# Patient Record
Sex: Male | Born: 1950 | Race: White | Hispanic: No | Marital: Single | Smoking: Never smoker
Health system: Southern US, Community
[De-identification: ages and names within clinical notes are randomized; demographics above are authoritative.]

## PROBLEM LIST (undated history)

## (undated) DIAGNOSIS — F1021 Alcohol dependence, in remission: Secondary | ICD-10-CM

## (undated) DIAGNOSIS — K7200 Acute and subacute hepatic failure without coma: Secondary | ICD-10-CM

## (undated) DIAGNOSIS — K7682 Hepatic encephalopathy: Secondary | ICD-10-CM

## (undated) DIAGNOSIS — D509 Iron deficiency anemia, unspecified: Secondary | ICD-10-CM

## (undated) DIAGNOSIS — D696 Thrombocytopenia, unspecified: Secondary | ICD-10-CM

## (undated) DIAGNOSIS — I10 Essential (primary) hypertension: Secondary | ICD-10-CM

## (undated) DIAGNOSIS — K7290 Hepatic failure, unspecified without coma: Secondary | ICD-10-CM

## (undated) DIAGNOSIS — L259 Unspecified contact dermatitis, unspecified cause: Secondary | ICD-10-CM

## (undated) HISTORY — PX: LIVER TRANSPLANT: SHX410

## (undated) HISTORY — DX: Thrombocytopenia, unspecified: D69.6

## (undated) HISTORY — DX: Hepatic encephalopathy: K76.82

## (undated) HISTORY — DX: Unspecified contact dermatitis, unspecified cause: L25.9

## (undated) HISTORY — DX: Alcohol dependence, in remission: F10.21

## (undated) HISTORY — DX: Acute and subacute hepatic failure without coma: K72.00

## (undated) HISTORY — DX: Essential (primary) hypertension: I10

## (undated) HISTORY — PX: OTHER SURGICAL HISTORY: SHX169

## (undated) HISTORY — DX: Iron deficiency anemia, unspecified: D50.9

## (undated) HISTORY — PX: HERNIA REPAIR: SHX51

## (undated) HISTORY — DX: Hepatic failure, unspecified without coma: K72.90

---

## 1999-09-07 HISTORY — PX: OTHER SURGICAL HISTORY: SHX169

## 2000-02-05 ENCOUNTER — Encounter: Payer: Self-pay | Admitting: Emergency Medicine

## 2000-02-05 ENCOUNTER — Inpatient Hospital Stay (HOSPITAL_COMMUNITY): Admission: EM | Admit: 2000-02-05 | Discharge: 2000-02-12 | Payer: Self-pay

## 2000-02-08 ENCOUNTER — Encounter: Payer: Self-pay | Admitting: General Surgery

## 2002-01-22 ENCOUNTER — Emergency Department (HOSPITAL_COMMUNITY): Admission: EM | Admit: 2002-01-22 | Discharge: 2002-01-22 | Payer: Self-pay | Admitting: Emergency Medicine

## 2002-05-13 ENCOUNTER — Emergency Department (HOSPITAL_COMMUNITY): Admission: EM | Admit: 2002-05-13 | Discharge: 2002-05-14 | Payer: Self-pay | Admitting: Emergency Medicine

## 2002-05-14 ENCOUNTER — Encounter: Payer: Self-pay | Admitting: Emergency Medicine

## 2002-09-06 DIAGNOSIS — K7200 Acute and subacute hepatic failure without coma: Secondary | ICD-10-CM

## 2002-09-06 HISTORY — DX: Acute and subacute hepatic failure without coma: K72.00

## 2004-02-09 ENCOUNTER — Inpatient Hospital Stay (HOSPITAL_COMMUNITY): Admission: EM | Admit: 2004-02-09 | Discharge: 2004-02-13 | Payer: Self-pay | Admitting: Emergency Medicine

## 2004-02-10 ENCOUNTER — Encounter (INDEPENDENT_AMBULATORY_CARE_PROVIDER_SITE_OTHER): Payer: Self-pay | Admitting: Cardiology

## 2004-10-16 ENCOUNTER — Ambulatory Visit: Payer: Self-pay

## 2004-10-16 ENCOUNTER — Ambulatory Visit: Payer: Self-pay | Admitting: Cardiology

## 2004-10-22 ENCOUNTER — Ambulatory Visit (HOSPITAL_COMMUNITY): Admission: RE | Admit: 2004-10-22 | Discharge: 2004-10-22 | Payer: Self-pay | Admitting: Cardiovascular Disease

## 2004-10-29 ENCOUNTER — Ambulatory Visit: Payer: Self-pay | Admitting: Internal Medicine

## 2005-11-16 ENCOUNTER — Emergency Department (HOSPITAL_COMMUNITY): Admission: EM | Admit: 2005-11-16 | Discharge: 2005-11-16 | Payer: Self-pay | Admitting: Family Medicine

## 2005-11-19 ENCOUNTER — Encounter: Admission: RE | Admit: 2005-11-19 | Discharge: 2005-11-19 | Payer: Self-pay | Admitting: Orthopedic Surgery

## 2006-11-14 ENCOUNTER — Ambulatory Visit: Payer: Self-pay | Admitting: Internal Medicine

## 2006-11-14 ENCOUNTER — Inpatient Hospital Stay (HOSPITAL_COMMUNITY): Admission: EM | Admit: 2006-11-14 | Discharge: 2006-11-16 | Payer: Self-pay | Admitting: Emergency Medicine

## 2006-11-14 ENCOUNTER — Encounter (INDEPENDENT_AMBULATORY_CARE_PROVIDER_SITE_OTHER): Payer: Self-pay | Admitting: Cardiology

## 2006-11-15 ENCOUNTER — Ambulatory Visit: Payer: Self-pay | Admitting: Infectious Diseases

## 2006-11-21 ENCOUNTER — Ambulatory Visit: Payer: Self-pay | Admitting: Gastroenterology

## 2007-05-01 ENCOUNTER — Ambulatory Visit (HOSPITAL_COMMUNITY): Admission: RE | Admit: 2007-05-01 | Discharge: 2007-05-01 | Payer: Self-pay | Admitting: Internal Medicine

## 2007-05-18 LAB — HM COLONOSCOPY: HM Colonoscopy: NORMAL

## 2008-08-19 ENCOUNTER — Other Ambulatory Visit: Payer: Self-pay | Admitting: Emergency Medicine

## 2008-08-19 ENCOUNTER — Inpatient Hospital Stay (HOSPITAL_COMMUNITY): Admission: AD | Admit: 2008-08-19 | Discharge: 2008-08-22 | Payer: Self-pay | Admitting: Internal Medicine

## 2008-08-20 ENCOUNTER — Ambulatory Visit: Payer: Self-pay | Admitting: Gastroenterology

## 2008-08-21 ENCOUNTER — Encounter (INDEPENDENT_AMBULATORY_CARE_PROVIDER_SITE_OTHER): Payer: Self-pay | Admitting: Interventional Radiology

## 2008-08-22 ENCOUNTER — Encounter: Payer: Self-pay | Admitting: Gastroenterology

## 2008-09-02 ENCOUNTER — Emergency Department (HOSPITAL_COMMUNITY): Admission: EM | Admit: 2008-09-02 | Discharge: 2008-09-02 | Payer: Self-pay | Admitting: Emergency Medicine

## 2008-12-20 ENCOUNTER — Encounter: Payer: Self-pay | Admitting: Internal Medicine

## 2009-03-10 ENCOUNTER — Ambulatory Visit: Payer: Self-pay | Admitting: Interventional Radiology

## 2009-03-10 ENCOUNTER — Encounter: Payer: Self-pay | Admitting: Emergency Medicine

## 2009-03-10 ENCOUNTER — Inpatient Hospital Stay (HOSPITAL_COMMUNITY): Admission: EM | Admit: 2009-03-10 | Discharge: 2009-03-12 | Payer: Self-pay | Admitting: Internal Medicine

## 2009-03-14 ENCOUNTER — Emergency Department (HOSPITAL_COMMUNITY): Admission: EM | Admit: 2009-03-14 | Discharge: 2009-03-14 | Payer: Self-pay | Admitting: Emergency Medicine

## 2009-03-17 ENCOUNTER — Ambulatory Visit (HOSPITAL_COMMUNITY): Admission: RE | Admit: 2009-03-17 | Discharge: 2009-03-17 | Payer: Self-pay | Admitting: Emergency Medicine

## 2009-03-25 ENCOUNTER — Inpatient Hospital Stay (HOSPITAL_COMMUNITY): Admission: EM | Admit: 2009-03-25 | Discharge: 2009-03-27 | Payer: Self-pay | Admitting: Emergency Medicine

## 2009-03-28 ENCOUNTER — Ambulatory Visit (HOSPITAL_COMMUNITY): Admission: RE | Admit: 2009-03-28 | Discharge: 2009-03-28 | Payer: Self-pay | Admitting: Emergency Medicine

## 2009-03-31 ENCOUNTER — Ambulatory Visit: Payer: Self-pay | Admitting: Gastroenterology

## 2009-05-19 ENCOUNTER — Inpatient Hospital Stay (HOSPITAL_COMMUNITY): Admission: EM | Admit: 2009-05-19 | Discharge: 2009-05-21 | Payer: Self-pay | Admitting: Emergency Medicine

## 2009-05-19 ENCOUNTER — Ambulatory Visit: Payer: Self-pay | Admitting: Infectious Diseases

## 2009-07-15 ENCOUNTER — Emergency Department (HOSPITAL_BASED_OUTPATIENT_CLINIC_OR_DEPARTMENT_OTHER): Admission: EM | Admit: 2009-07-15 | Discharge: 2009-07-15 | Payer: Self-pay | Admitting: Emergency Medicine

## 2009-08-17 ENCOUNTER — Encounter: Payer: Self-pay | Admitting: Internal Medicine

## 2009-09-19 ENCOUNTER — Ambulatory Visit (HOSPITAL_COMMUNITY): Admission: RE | Admit: 2009-09-19 | Discharge: 2009-09-19 | Payer: Self-pay | Admitting: Interventional Radiology

## 2009-10-29 ENCOUNTER — Inpatient Hospital Stay (HOSPITAL_COMMUNITY): Admission: EM | Admit: 2009-10-29 | Discharge: 2009-11-01 | Payer: Self-pay | Admitting: Emergency Medicine

## 2009-10-30 ENCOUNTER — Encounter: Payer: Self-pay | Admitting: Internal Medicine

## 2009-10-30 LAB — CONVERTED CEMR LAB
HDL: 25 mg/dL
LDL Cholesterol: 37 mg/dL
TSH: 2.141 microintl units/mL

## 2009-10-31 ENCOUNTER — Ambulatory Visit: Payer: Self-pay | Admitting: Internal Medicine

## 2009-11-01 ENCOUNTER — Encounter: Payer: Self-pay | Admitting: Internal Medicine

## 2009-11-01 LAB — CONVERTED CEMR LAB
HCT: 22.8 %
Hemoglobin: 8.1 g/dL
RBC: 2.32 M/uL

## 2009-11-10 ENCOUNTER — Ambulatory Visit (HOSPITAL_COMMUNITY): Admission: RE | Admit: 2009-11-10 | Discharge: 2009-11-10 | Payer: Self-pay | Admitting: Interventional Radiology

## 2009-12-15 ENCOUNTER — Ambulatory Visit (HOSPITAL_COMMUNITY): Admission: RE | Admit: 2009-12-15 | Discharge: 2009-12-15 | Payer: Self-pay | Admitting: Interventional Radiology

## 2010-01-26 ENCOUNTER — Encounter: Payer: Self-pay | Admitting: Internal Medicine

## 2010-02-25 ENCOUNTER — Ambulatory Visit (HOSPITAL_COMMUNITY): Admission: RE | Admit: 2010-02-25 | Discharge: 2010-02-25 | Payer: Self-pay | Admitting: Interventional Radiology

## 2010-03-27 ENCOUNTER — Ambulatory Visit (HOSPITAL_COMMUNITY): Admission: RE | Admit: 2010-03-27 | Discharge: 2010-03-27 | Payer: Self-pay | Admitting: Interventional Radiology

## 2010-05-19 ENCOUNTER — Ambulatory Visit (HOSPITAL_COMMUNITY): Admission: RE | Admit: 2010-05-19 | Discharge: 2010-05-19 | Payer: Self-pay | Admitting: Interventional Radiology

## 2010-06-06 ENCOUNTER — Emergency Department (HOSPITAL_COMMUNITY): Admission: EM | Admit: 2010-06-06 | Discharge: 2010-06-06 | Payer: Self-pay | Admitting: Emergency Medicine

## 2010-06-16 ENCOUNTER — Ambulatory Visit (HOSPITAL_COMMUNITY)
Admission: RE | Admit: 2010-06-16 | Discharge: 2010-06-16 | Payer: Self-pay | Source: Home / Self Care | Admitting: Interventional Radiology

## 2010-06-19 ENCOUNTER — Emergency Department (HOSPITAL_COMMUNITY)
Admission: EM | Admit: 2010-06-19 | Discharge: 2010-06-19 | Payer: Self-pay | Source: Home / Self Care | Admitting: Emergency Medicine

## 2010-08-20 ENCOUNTER — Ambulatory Visit (HOSPITAL_COMMUNITY)
Admission: RE | Admit: 2010-08-20 | Discharge: 2010-08-20 | Payer: Self-pay | Source: Home / Self Care | Attending: Interventional Radiology | Admitting: Interventional Radiology

## 2010-08-29 ENCOUNTER — Inpatient Hospital Stay (HOSPITAL_COMMUNITY)
Admission: EM | Admit: 2010-08-29 | Discharge: 2010-09-03 | Payer: Self-pay | Source: Home / Self Care | Attending: Pulmonary Disease | Admitting: Pulmonary Disease

## 2010-08-29 DIAGNOSIS — K7290 Hepatic failure, unspecified without coma: Secondary | ICD-10-CM

## 2010-08-30 ENCOUNTER — Encounter: Payer: Self-pay | Admitting: Internal Medicine

## 2010-08-30 LAB — CONVERTED CEMR LAB
Hemoglobin: 7.4 g/dL
MCV: 101.9 fL
WBC: 10.5 10*3/uL

## 2010-08-31 ENCOUNTER — Encounter (INDEPENDENT_AMBULATORY_CARE_PROVIDER_SITE_OTHER): Payer: Self-pay | Admitting: Pulmonary Disease

## 2010-08-31 ENCOUNTER — Encounter: Payer: Self-pay | Admitting: Internal Medicine

## 2010-08-31 LAB — CONVERTED CEMR LAB
ALT: 21 units/L
AST: 39 units/L
BUN: 24 mg/dL
Chloride: 119 meq/L
Glucose, Bld: 131 mg/dL
Total Bilirubin: 1.9 mg/dL
Total Protein: 6.1 g/dL

## 2010-09-03 ENCOUNTER — Encounter: Payer: Self-pay | Admitting: Internal Medicine

## 2010-09-03 LAB — CONVERTED CEMR LAB
Alkaline Phosphatase: 88 units/L
BUN: 37 mg/dL
Calcium: 7.3 mg/dL
Creatinine, Ser: 1.65 mg/dL
Glucose, Bld: 104 mg/dL
Potassium: 3.6 meq/L
Sodium: 128 meq/L
Total Protein: 6.1 g/dL

## 2010-09-14 ENCOUNTER — Ambulatory Visit (HOSPITAL_COMMUNITY)
Admission: RE | Admit: 2010-09-14 | Discharge: 2010-09-14 | Payer: Self-pay | Source: Home / Self Care | Attending: Interventional Radiology | Admitting: Interventional Radiology

## 2010-09-22 ENCOUNTER — Encounter: Payer: Self-pay | Admitting: Internal Medicine

## 2010-09-25 ENCOUNTER — Encounter: Payer: Self-pay | Admitting: Internal Medicine

## 2010-09-26 ENCOUNTER — Encounter: Payer: Self-pay | Admitting: Interventional Radiology

## 2010-09-27 ENCOUNTER — Encounter: Payer: Self-pay | Admitting: Diagnostic Radiology

## 2010-09-27 ENCOUNTER — Encounter: Payer: Self-pay | Admitting: Interventional Radiology

## 2010-09-27 NOTE — Discharge Summary (Signed)
[name], [name]            ACCOUNT NO.:  [id number]  MEDICAL RECORD NO.:  [id number]          PATIENT TYPE:  INP  LOCATION:  5157                         FACILITY:  [facility]  PHYSICIAN:  Leslye Peer, MD    DATE OF BIRTH:  [birth year]  DATE OF ADMISSION:  08/29/2010 DATE OF DISCHARGE:  09/03/2010                              DISCHARGE SUMMARY   DISCHARGE DIAGNOSES: 1. Resolved hepatic encephalopathy. 2. Acute respiratory failure secondary to resolved hepatic     encephalopathy. 3. 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 4. Non-anion gap acidosis. 5. Anemia. 6. Thrombocytopenia. 7. History of biliary and internal/external drain placement for     biliary duct obstruction.  CONSULTANTS:  Interventional Radiology, Dr. Kristeen Mans.  LABORATORY DATA:  Prograf level 2.3 on August 31, 2010, phosphorus September 03, 2010, 3.8, magnesium 1.7, sodium 128, potassium 3.6, chloride 102, CO2 of 21, glucose 104, BUN 37, creatinine 1.65, PT/INR is 22.1/1.92.  Hemoglobin 7.2, hematocrit 20.3, platelet count 44, white blood cell count 5.9.  BRIEF HISTORY:  A 60 year old male patient with known history of alcoholism, resultant liver failure, and liver transplant in 2005 at the [facility] presents to the hospital on August 29, 2010, with altered sensorium and an ammonia level of 409.  He was intubated by the emergency room physician with concern for inability to protect airway.  Pulmonary critical care has asked to admit.  Of note, the patient was found to have of a bottle of Percocet at the bedside, which was refilled only 2 weeks prior to the hospital presentation, however, was empty on hospital at admit day.  His acetaminophen level on hospital admit day was less than 10.  HOSPITAL COURSE BY DISCHARGE DIAGNOSES: 1. Hepatic encephalopathy.  After further evaluation, [name]     reports following  return to normal cognitive status that he had     been trying to alter his home GI regimen, reporting that he had     been alternating his neomycin, and rifaximin administration.  Also     had been alternating, and at times not taking lactulose.  He was     admitted to the hospital in coma/altered sensorium.  He was given     lactulose, and neomycin, and his maintenance medications.  His     ammonia fell nicely and upon time of dictation, his last ammonia     level, which was on the 26th  was down to 44 from over 400.  At the     time of dictation, his mentation is he has returned to baseline     level of consciousness. 2. Acute respiratory failure secondary to hepatic encephalopathy.  He     did require mechanical ventilation and airway support from August 29, 2010, to August 30, 2010.  He was extubated without sequelae. 3. History of end-stage hepatic disease secondary to alcoholism,     status post liver transplant in 2005.  [name] PT/INR is     stable.  Platelet count is stable.  LFTs have improved  following     hospital admit with AST of 29, ALT of 18.  Upon time of discharge,     INR was 1.92, this had decreased from 2.14 since time of discharge.     Pulmonary Hepatics followup, he will be discharged home on his     regular regimen, which had been probably dictated by the Transplant     Center at [facility].  I spoke with the transplant     coordinator at [facility], also has been on the phone     with [facility].  They would be happy to evaluate     [name] for appropriateness to be followed at [facility] for local     support; however, they requested that his records be faxed to them,     as well as that they received a copy of disk version copy of all of     his imaging studies including MRI, echocardiogram, and a CAT scan     imaging dating back for the last 3-6 months.  The contact number     for [facility] is [phone].   This is the Department Of     Gastroenterology, he will need to be forwarded to the transplant     services. 4. Non-anion gap acidosis.  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  Plan for this is to continue his     bicarbonate supplementation. 5. Chronic anemia.  Currently, hemoglobin stable in the 7.2 range with     no evidence of bleeding.  He also has a history of iron-deficiency     anemia.  He will continue his iron supplementation. 6. Thrombocytopenia.  This is stable with no evidence of bleeding.     Platelet count as noted above. 7. History of chronic biliary and chronic peritoneal catheter drain.     These were maintained per his home regimen.  He did have his     biliary catheter dislodged during this hospitalization.  This did     require replacement, it was change and been pulled back in     malposition and caused leaking.  Interventional Radiology saw Mr.     [name], and were able to replace and reposition a new 8.5-French     catheter in its place.  It was capped as before.  DISCHARGE INSTRUCTIONS:  Diet as tolerated.  DISCHARGE MEDICATIONS: 1. Lactulose 10 g, he used to take 30 mL daily. 2. Ferrous sulfate 325 mg daily. 3. Furosemide 40 mg twice a day. 4. Magnesium oxide 400 mg daily. 5. Multivitamin daily. 6. Nadolol 20 mg daily. 7. Neomycin 500 mg 3 tablets. 8. Omeprazole 20 mg daily. 9. Prograf 0.5 mg daily. 10.Sodium bicarb 650 mg, 3 tablets by mouth 3 times a day. 11.Spironolactone 100 mg by mouth daily. 12.Ursodiol 300 mg 3 tablets a day. 13.Vitamin C 500 mg 1 tablet daily. 14.Rifaximin 1 tablet twice a day. 15.Zinc 220 mg p.o. twice a day.  He is instructed to discontinue all     narcotics.  DISCHARGE FOLLOWUP:  He will be seen at [facility] on September 22, 2009, for diagnostic imaging.  He will follow with Dr. Elizebeth Koller on September 28, 2009, at 9:45 a.m.  He will also see Dr. Rene Paci here in [facility]  October 16, 2009, at 9:15.  Again, [facility] has been contacted.  Their number has been included in the discharge dictation, he would  certainly benefit from outpatient evaluation, and relationship with local transplant services.  We have initiated the steps, will need the assistance of [facility] for further assistance in this matter.  As noted, he has been instructed for the appropriate forms this will require.     Zenia Resides, NP   ______________________________ Leslye Peer, MD    PB/MEDQ  D:  09/03/2010  T:  09/04/2010  Job:  [id number]  cc:   Paulo A. Elizebeth Koller, MD Dr. Lendon Ka A. Felicity Coyer, MD  Electronically Signed by Zenia Resides NP on 09/04/2010 08:10:39 AM Electronically Signed by Levy Pupa MD on 09/27/2010 01:16:44 PM

## 2010-10-07 ENCOUNTER — Encounter: Payer: Self-pay | Admitting: Interventional Radiology

## 2010-10-09 ENCOUNTER — Encounter: Payer: Self-pay | Admitting: Internal Medicine

## 2010-10-11 ENCOUNTER — Encounter: Payer: Self-pay | Admitting: Internal Medicine

## 2010-10-14 ENCOUNTER — Encounter: Payer: Self-pay | Admitting: Internal Medicine

## 2010-10-14 DIAGNOSIS — D696 Thrombocytopenia, unspecified: Secondary | ICD-10-CM | POA: Insufficient documentation

## 2010-10-14 DIAGNOSIS — J9600 Acute respiratory failure, unspecified whether with hypoxia or hypercapnia: Secondary | ICD-10-CM | POA: Insufficient documentation

## 2010-10-14 DIAGNOSIS — F1021 Alcohol dependence, in remission: Secondary | ICD-10-CM | POA: Insufficient documentation

## 2010-10-16 ENCOUNTER — Encounter: Payer: Self-pay | Admitting: Internal Medicine

## 2010-10-16 ENCOUNTER — Ambulatory Visit (INDEPENDENT_AMBULATORY_CARE_PROVIDER_SITE_OTHER): Payer: Medicare (Managed Care) | Admitting: Internal Medicine

## 2010-10-16 DIAGNOSIS — D696 Thrombocytopenia, unspecified: Secondary | ICD-10-CM

## 2010-10-16 DIAGNOSIS — D509 Iron deficiency anemia, unspecified: Secondary | ICD-10-CM | POA: Insufficient documentation

## 2010-10-16 DIAGNOSIS — Z860100 Personal history of colon polyps, unspecified: Secondary | ICD-10-CM | POA: Insufficient documentation

## 2010-10-16 DIAGNOSIS — L259 Unspecified contact dermatitis, unspecified cause: Secondary | ICD-10-CM | POA: Insufficient documentation

## 2010-10-16 DIAGNOSIS — K7200 Acute and subacute hepatic failure without coma: Secondary | ICD-10-CM

## 2010-10-16 DIAGNOSIS — Z8679 Personal history of other diseases of the circulatory system: Secondary | ICD-10-CM | POA: Insufficient documentation

## 2010-10-18 ENCOUNTER — Emergency Department (HOSPITAL_COMMUNITY): Payer: Medicare (Managed Care)

## 2010-10-18 ENCOUNTER — Inpatient Hospital Stay (HOSPITAL_COMMUNITY)
Admission: EM | Admit: 2010-10-18 | Discharge: 2010-10-22 | DRG: 442 | Disposition: A | Discharge status: Home / Self Care | Payer: Medicare (Managed Care) | Attending: Internal Medicine | Admitting: Internal Medicine

## 2010-10-18 DIAGNOSIS — K7030 Alcoholic cirrhosis of liver without ascites: Secondary | ICD-10-CM | POA: Diagnosis present

## 2010-10-18 DIAGNOSIS — K7682 Hepatic encephalopathy: Principal | ICD-10-CM | POA: Diagnosis present

## 2010-10-18 DIAGNOSIS — K766 Portal hypertension: Secondary | ICD-10-CM | POA: Diagnosis present

## 2010-10-18 DIAGNOSIS — E875 Hyperkalemia: Secondary | ICD-10-CM | POA: Diagnosis present

## 2010-10-18 DIAGNOSIS — E872 Acidosis, unspecified: Secondary | ICD-10-CM | POA: Diagnosis present

## 2010-10-18 DIAGNOSIS — K7290 Hepatic failure, unspecified without coma: Principal | ICD-10-CM | POA: Diagnosis present

## 2010-10-18 DIAGNOSIS — Z9119 Patient's noncompliance with other medical treatment and regimen: Secondary | ICD-10-CM

## 2010-10-18 DIAGNOSIS — A419 Sepsis, unspecified organism: Secondary | ICD-10-CM

## 2010-10-18 DIAGNOSIS — D696 Thrombocytopenia, unspecified: Secondary | ICD-10-CM | POA: Diagnosis present

## 2010-10-18 DIAGNOSIS — Z944 Liver transplant status: Secondary | ICD-10-CM

## 2010-10-18 DIAGNOSIS — K769 Liver disease, unspecified: Secondary | ICD-10-CM | POA: Diagnosis present

## 2010-10-18 DIAGNOSIS — Z91199 Patient's noncompliance with other medical treatment and regimen due to unspecified reason: Secondary | ICD-10-CM

## 2010-10-18 DIAGNOSIS — D649 Anemia, unspecified: Secondary | ICD-10-CM | POA: Diagnosis present

## 2010-10-18 LAB — BLOOD GAS, ARTERIAL
Acid-base deficit: 4.8 mmol/L — ABNORMAL HIGH (ref 0.0–2.0)
Drawn by: 340271
O2 Saturation: 96.8 %
Patient temperature: 98.6

## 2010-10-18 LAB — CBC
HCT: 25.7 % — ABNORMAL LOW (ref 39.0–52.0)
Platelets: 40 10*3/uL — ABNORMAL LOW (ref 150–400)
RBC: 2.61 MIL/uL — ABNORMAL LOW (ref 4.22–5.81)
RDW: 17.4 % — ABNORMAL HIGH (ref 11.5–15.5)
WBC: 3.8 10*3/uL — ABNORMAL LOW (ref 4.0–10.5)

## 2010-10-18 LAB — COMPREHENSIVE METABOLIC PANEL
ALT: 21 U/L (ref 0–53)
Alkaline Phosphatase: 183 U/L — ABNORMAL HIGH (ref 39–117)
CO2: 22 mEq/L (ref 19–32)
GFR calc non Af Amer: 58 mL/min — ABNORMAL LOW (ref >60)
Glucose, Bld: 115 mg/dL — ABNORMAL HIGH (ref 70–99)
Potassium: 5.9 mEq/L — ABNORMAL HIGH (ref 3.5–5.1)
Sodium: 136 mEq/L (ref 135–145)
Total Bilirubin: 1.8 mg/dL — ABNORMAL HIGH (ref 0.3–1.2)

## 2010-10-18 LAB — DIFFERENTIAL
Lymphocytes Relative: 18 % (ref 12–46)
Lymphs Abs: 0.7 10*3/uL (ref 0.7–4.0)
Monocytes Absolute: 0.4 10*3/uL (ref 0.1–1.0)
Monocytes Relative: 10 % (ref 3–12)
Neutro Abs: 2.4 10*3/uL (ref 1.7–7.7)

## 2010-10-18 LAB — ETHANOL: Alcohol, Ethyl (B): <5 mg/dL (ref 0–10)

## 2010-10-18 LAB — URINE MICROSCOPIC-ADD ON

## 2010-10-18 LAB — URINALYSIS, ROUTINE W REFLEX MICROSCOPIC
Nitrite: NEGATIVE
Specific Gravity, Urine: 1.01 (ref 1.005–1.030)
Urobilinogen, UA: 0.2 mg/dL (ref 0.0–1.0)
pH: 8.5 — ABNORMAL HIGH (ref 5.0–8.0)

## 2010-10-19 DIAGNOSIS — A419 Sepsis, unspecified organism: Secondary | ICD-10-CM

## 2010-10-19 DIAGNOSIS — K7682 Hepatic encephalopathy: Secondary | ICD-10-CM

## 2010-10-19 DIAGNOSIS — K7290 Hepatic failure, unspecified without coma: Secondary | ICD-10-CM

## 2010-10-19 DIAGNOSIS — K7460 Unspecified cirrhosis of liver: Secondary | ICD-10-CM

## 2010-10-19 LAB — HEPATIC FUNCTION PANEL
AST: 38 U/L — ABNORMAL HIGH (ref 0–37)
Bilirubin, Direct: 0.8 mg/dL — ABNORMAL HIGH (ref 0.0–0.3)
Indirect Bilirubin: 1.1 mg/dL — ABNORMAL HIGH (ref 0.3–0.9)
Total Bilirubin: 1.9 mg/dL — ABNORMAL HIGH (ref 0.3–1.2)
Total Protein: 7 g/dL (ref 6.0–8.3)

## 2010-10-19 LAB — BLOOD GAS, ARTERIAL
Acid-base deficit: 6.6 mmol/L — ABNORMAL HIGH (ref 0.0–2.0)
Bicarbonate: 16.7 mEq/L — ABNORMAL LOW (ref 20.0–24.0)
O2 Saturation: 97.9 %
Patient temperature: 98.6
TCO2: 15.8 mmol/L (ref 0–100)

## 2010-10-19 LAB — PROTIME-INR
INR: 1.68 — ABNORMAL HIGH (ref 0.00–1.49)
Prothrombin Time: 20 seconds — ABNORMAL HIGH (ref 11.6–15.2)

## 2010-10-19 LAB — CARDIAC PANEL(CRET KIN+CKTOT+MB+TROPI): Troponin I: 0.01 ng/mL (ref 0.00–0.06)

## 2010-10-19 LAB — BASIC METABOLIC PANEL
BUN: 22 mg/dL (ref 6–23)
BUN: 20 mg/dL (ref 6–23)
CO2: 17 mEq/L — ABNORMAL LOW (ref 19–32)
CO2: 17 mEq/L — ABNORMAL LOW (ref 19–32)
Chloride: 115 mEq/L — ABNORMAL HIGH (ref 96–112)
Chloride: 117 mEq/L — ABNORMAL HIGH (ref 96–112)
Creatinine, Ser: 1.16 mg/dL (ref 0.4–1.5)
Creatinine, Ser: 1.21 mg/dL (ref 0.4–1.5)
Glucose, Bld: 112 mg/dL — ABNORMAL HIGH (ref 70–99)
Potassium: 5.2 mEq/L — ABNORMAL HIGH (ref 3.5–5.1)

## 2010-10-19 LAB — CBC
HCT: 23.4 % — ABNORMAL LOW (ref 39.0–52.0)
Hemoglobin: 7.9 g/dL — ABNORMAL LOW (ref 13.0–17.0)
RBC: 2.36 MIL/uL — ABNORMAL LOW (ref 4.22–5.81)
RDW: 17.3 % — ABNORMAL HIGH (ref 11.5–15.5)
WBC: 3.1 10*3/uL — ABNORMAL LOW (ref 4.0–10.5)

## 2010-10-19 LAB — PROCALCITONIN: Procalcitonin: <0.1 ng/mL

## 2010-10-19 LAB — BODY FLUID CELL COUNT WITH DIFFERENTIAL

## 2010-10-19 LAB — PHOSPHORUS: Phosphorus: 3.4 mg/dL (ref 2.3–4.6)

## 2010-10-19 LAB — MAGNESIUM: Magnesium: 2.1 mg/dL (ref 1.5–2.5)

## 2010-10-19 LAB — AMYLASE: Amylase: 84 U/L (ref 0–105)

## 2010-10-20 ENCOUNTER — Inpatient Hospital Stay (HOSPITAL_COMMUNITY): Payer: Medicare (Managed Care)

## 2010-10-20 LAB — CBC
Hemoglobin: 6.9 g/dL — CL (ref 13.0–17.0)
MCH: 33.2 pg (ref 26.0–34.0)
MCV: 100.5 fL — ABNORMAL HIGH (ref 78.0–100.0)
RBC: 2.05 MIL/uL — ABNORMAL LOW (ref 4.22–5.81)

## 2010-10-20 LAB — BASIC METABOLIC PANEL
BUN: 18 mg/dL (ref 6–23)
CO2: 16 mEq/L — ABNORMAL LOW (ref 19–32)
Chloride: 117 mEq/L — ABNORMAL HIGH (ref 96–112)
Creatinine, Ser: 1.3 mg/dL (ref 0.4–1.5)

## 2010-10-20 LAB — URINE CULTURE: Culture: NO GROWTH

## 2010-10-21 LAB — BASIC METABOLIC PANEL
BUN: 17 mg/dL (ref 6–23)
Calcium: 7.7 mg/dL — ABNORMAL LOW (ref 8.4–10.5)
Creatinine, Ser: 1.32 mg/dL (ref 0.4–1.5)
GFR calc non Af Amer: 56 mL/min — ABNORMAL LOW (ref >60)
Glucose, Bld: 129 mg/dL — ABNORMAL HIGH (ref 70–99)
Sodium: 131 mEq/L — ABNORMAL LOW (ref 135–145)

## 2010-10-21 LAB — CBC
HCT: 24.2 % — ABNORMAL LOW (ref 39.0–52.0)
MCH: 32.5 pg (ref 26.0–34.0)
MCHC: 33.5 g/dL (ref 30.0–36.0)
MCV: 97.2 fL (ref 78.0–100.0)
RDW: 19.3 % — ABNORMAL HIGH (ref 11.5–15.5)

## 2010-10-21 LAB — BODY FLUID CULTURE

## 2010-10-21 LAB — VANCOMYCIN, TROUGH: Vancomycin Tr: 26.4 ug/mL (ref 10.0–20.0)

## 2010-10-22 LAB — COMPREHENSIVE METABOLIC PANEL
ALT: 19 U/L (ref 0–53)
AST: 30 U/L (ref 0–37)
Albumin: 1.4 g/dL — ABNORMAL LOW (ref 3.5–5.2)
Alkaline Phosphatase: 139 U/L — ABNORMAL HIGH (ref 39–117)
Calcium: 7.9 mg/dL — ABNORMAL LOW (ref 8.4–10.5)
GFR calc Af Amer: >60 mL/min (ref >60)
Glucose, Bld: 113 mg/dL — ABNORMAL HIGH (ref 70–99)
Potassium: 4.8 mEq/L (ref 3.5–5.1)
Sodium: 135 mEq/L (ref 135–145)
Total Protein: 6.8 g/dL (ref 6.0–8.3)

## 2010-10-22 LAB — AMMONIA: Ammonia: 60 umol/L — ABNORMAL HIGH (ref 11–35)

## 2010-10-22 LAB — CBC
MCV: 97.1 fL (ref 78.0–100.0)
Platelets: 46 10*3/uL — ABNORMAL LOW (ref 150–400)
RBC: 2.43 MIL/uL — ABNORMAL LOW (ref 4.22–5.81)
RDW: 18.8 % — ABNORMAL HIGH (ref 11.5–15.5)
WBC: 4.4 10*3/uL (ref 4.0–10.5)

## 2010-10-22 LAB — DIFFERENTIAL
Basophils Absolute: 0 10*3/uL (ref 0.0–0.1)
Basophils Relative: 1 % (ref 0–1)
Eosinophils Absolute: 0.3 10*3/uL (ref 0.0–0.7)
Eosinophils Relative: 7 % — ABNORMAL HIGH (ref 0–5)
Lymphs Abs: 0.7 10*3/uL (ref 0.7–4.0)
Neutrophils Relative %: 66 % (ref 43–77)

## 2010-10-22 NOTE — Assessment & Plan Note (Signed)
Summary: NEW/MEDICARE/#/CD   Vital Signs:  Patient profile:   60 year old male Height:      72 inches (182.88 cm) Weight:      165.8 pounds (75.36 kg) BMI:     22.57 O2 Sat:      99 % on Room air Temp:     97.5 degrees F (36.39 degrees C) oral Pulse rate:   61 / minute BP sitting:   102 / 62  (left arm) Cuff size:   regular  Vitals Entered By: Orlan Leavens RMA (October 16, 2010 9:45 AM)  O2 Flow:  Room air CC: New patient Is Patient Diabetic? No Pain Assessment Patient in pain? no        Primary Care Provider:  Newt Lukes MD  CC:  New patient.  History of Present Illness: new pt to me and our group, here to est care  ESLD s/p transplant status 2004 - awaiting new transplant now due to dysfx, cont symptoms  has external bilary drain, self drains "when i need to" managed by GI/surg in [place] area - follows there every 2-39mo labs done locally q2-3weeks for [facility] [place] - last med changes in 09/2010 hosp locally 08/2010 for hep encephalopathy reports compliance with ongoing medical tx  c/o rash on RLE - onset current outbreak 6 weeks ago +hx same - 2 years ago assoc with itch and raised red knots denies bites, outdoor exposure or involvment elsewhere on body preior tx with rx steroid effective but none in >98mo otc meds and creams ineffective symptoms relief no fever, no spreading  Preventive Screening-Counseling & Management  Alcohol-Tobacco     Alcohol drinks/day: 0     Alcohol Counseling: not indicated; patient does not drink     Smoking Status: never     Tobacco Counseling: not indicated; no tobacco use  Caffeine-Diet-Exercise     Diet Counseling: not indicated; diet is assessed to be healthy     Does Patient Exercise: no     Exercise Counseling: to improve exercise regimen     Depression Counseling: not indicated; screening negative for depression  Comments: sober since 2003  Safety-Violence-Falls     Seat Belt Counseling: not  indicated; patient wears seat belts     Firearm Counseling: not applicable     Violence Counseling: not applicable     Fall Risk Counseling: not indicated; no significant falls noted  Clinical Review Panels:  Lipid Management   Cholesterol:  69 (10/30/2009)   LDL (bad choesterol):  37 (10/30/2009)   HDL (good cholesterol):  25 (10/30/2009)   Triglycerides:  35 (10/30/2009)  CBC   WBC:  5.9 (09/03/2010)   RBC:  2.11 (09/03/2010)   Hgb:  7.2 (09/03/2010)   Hct:  20.3 (09/03/2010)   Platelets:  44 (09/03/2010)   MCV  96.2 (09/03/2010)  Complete Metabolic Panel   Glucose:  104 (09/03/2010)   Sodium:  128 (09/03/2010)   Potassium:  3.6 (09/03/2010)   Chloride:  102 (09/03/2010)   CO2:  21 (09/03/2010)   BUN:  37 (09/03/2010)   Creatinine:  1.65 (09/03/2010)   Albumin:  1.5 (09/03/2010)   Total Protein:  6.1 (09/03/2010)   Calcium:  7.3 (09/03/2010)   Total Bili:  3.6 (09/03/2010)   Alk Phos:  88 (09/03/2010)   SGPT (ALT):  18 (09/03/2010)   SGOT (AST):  29 (09/03/2010)   Current Medications (verified): 1)  Lactulose 10 Gm/78ml Soln (Lactulose) .... Take 30ml Two Times A Day  2)  Ferrous Sulfate 325 (65 Fe) Mg Tbec (Ferrous Sulfate) .... Take 1 By Mouth Once Daily 3)  Furosemide 40 Mg Tabs (Furosemide) .... Take 1 Two Times A Day 4)  Magnesium Oxide 400 Mg Tabs (Magnesium Oxide) .... Take 4 Three Times A Day 5)  Nadolol 20 Mg Tabs (Nadolol) .... Take 2 At Bedtime 6)  Prograf 5 Mg Caps (Tacrolimus) .... Take 4 Three Times A Day 7)  Sodium Bicarbonate 650 Mg Tabs (Sodium Bicarbonate) .... Take 3 Three Times A Day 8)  Ursodiol 300 Mg Caps (Ursodiol) .... Take 3 By Mouth Once Daily 9)  Vitamin C 500 Mg Tabs (Ascorbic Acid) .... Take 1 By Mouth Once Daily 10)  Spironolactone 50 Mg Tabs (Spironolactone) .... Take 1 Two Times A Day 11)  Bactrim 400-80 Mg Tabs (Sulfamethoxazole-Trimethoprim) .... Take 1 Three Times A Week 12)  Xifaxan 550mg  .... Take 1 Two Times A Day 13)   Kalexate .... Take 4 Teaspoon Every Other Day 14)  Zinc Sulfate 220 Mg Tabs (Zinc Sulfate) .... Take 2 By Mouth Once Daily  Allergies (verified): 1)  ! 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 Elizebeth Koller at [facility]  Past Surgical History: Liver transplant in 2004 at the [facility] (L) arm Inguinal herniorrhaphy [place] (L) ankle (2001)  Family History: Family History of Arthritis (mom) Family History Hypertension (mom) Family History Lung cancer (dad)  Social History: Never Smoked no alcohol since 2003 remotely married - divorced in 1979 after 3 kids ('73, '75, '77) single, lives with g-friend laura diabled Smoking Status:  never Does Patient Exercise:  no  Review of Systems       see HPI above. I have reviewed all other systems and they were negative.   Physical Exam  General:  alert and cooperative to examination.   NAD Head:  Normocephalic and atraumatic without obvious abnormalities. No apparent alopecia or balding. Eyes:  vision grossly intact; pupils equal, round and reactive to light.  conjunctiva and lids normal.   no jaundice Ears:  R ear normal and L ear normal.   Mouth:  teeth and gums in fair repair; mucous membranes moist, without lesions or ulcers. oropharynx clear without exudate, no erythema.  Lungs:  normal respiratory effort, no intercostal retractions or use of accessory muscles; normal breath sounds bilaterally - no crackles and no wheezes.    Heart:  normal rate, regular rhythm, 3/6 syst murmur, and no rub. BLE without edema. Abdomen:  distended - inscional hernia along upper abd inscion from prior liver transplant - exterior bilary cath capped and taped to RUQ - firm, +BS, no obvious ascites Skin:  diffuse follicaular dermatitis affecting RLE - LLE  w/o rash, no other rashes, vesicles, ulcers, or erythema. No nodules or irregularity to palpation.  Psych:  Oriented X3, memory intact for recent and remote, normally interactive, good eye contact, not anxious appearing, not depressed appearing, and not agitated.      Impression & Recommendations:  Problem # 1:  HEPATIC FAILURE, END STAGE (ICD-570) s/p transplant 2004, now awaiting new transplant due to dysfx and cont encephalopathy on [employer] with transplant coordinator in [place] follows at [facility] q2-53mo but would also like local GI - will look into helping est with same last hosp for hepatic encephalopathy 08/2010 at [facility] reviewed - cont  current meds as ongoing - mgmt by hepatologist in [place] cont bilary drainage as needed  will review records from [place] (requested today)  Problem # 2:  ANEMIA, IRON DEFICIENCY (ICD-280.9) chronic Hgb 7-8 range His updated medication list for this problem includes:    Ferrous Sulfate 325 (65 Fe) Mg Tbec (Ferrous sulfate) .Marland Kitchen... Take 1 by mouth once daily  Hgb: 7.2 (09/03/2010)   Hct: 20.3 (09/03/2010)   Platelets: 44 (09/03/2010) RBC: 2.11 (09/03/2010)   WBC: 5.9 (09/03/2010) MCV: 96.2 (09/03/2010)   TSH: 2.141 (10/30/2009)  Problem # 3:  THROMBOCYTOPENIA (ICD-287.5)  Problem # 4:  DERMATITIS (ICD-692.9) new rx for steroid cream provided today -  His updated medication list for this problem includes:    Clobetasol Propionate 0.05 % Crea (Clobetasol propionate) .Marland Kitchen... Apply to affected skin two times a day as needed  Discussed avoidance of triggers and symptomatic treatment.   Complete Medication List: 1)  Lactulose 10 Gm/1ml Soln (Lactulose) .... Take 30ml two times a day 2)  Ferrous Sulfate 325 (65 Fe) Mg Tbec (Ferrous sulfate) .... Take 1 by mouth once daily 3)  Furosemide 40 Mg Tabs (Furosemide) .... Take 1 two times a day 4)  Magnesium Oxide 400 Mg Tabs (Magnesium oxide) .... Take 4 three times a day 5)  Nadolol 20 Mg Tabs  (Nadolol) .... Take 2 at bedtime 6)  Prograf 5 Mg Caps (Tacrolimus) .... Take 4 three times a day 7)  Sodium Bicarbonate 650 Mg Tabs (Sodium bicarbonate) .... Take 3 three times a day 8)  Ursodiol 300 Mg Caps (Ursodiol) .... Take 3 by mouth once daily 9)  Vitamin C 500 Mg Tabs (Ascorbic acid) .... Take 1 by mouth once daily 10)  Spironolactone 50 Mg Tabs (Spironolactone) .... Take 1 two times a day 11)  Bactrim 400-80 Mg Tabs (Sulfamethoxazole-trimethoprim) .... Take 1 three times a week 12)  Xifaxan 550 Mg Tabs (Rifaximin) .Marland Kitchen.. 1 by mouth two times a day 13)  Kayexalate Powd (Sodium polystyrene sulfonate) .... 4 tsp by mouth every other day 14)  Zinc Sulfate 220 Mg Tabs (Zinc sulfate) .... Take 2 by mouth once daily 15)  Clobetasol Propionate 0.05 % Crea (Clobetasol propionate) .... Apply to affected skin two times a day as needed  Patient Instructions: 1)  it was good to see you today. 2)  use steroid cream for rash and itch on leg - your prescription has been submitted to your pharmacy. Please take as directed. Contact our office if you believe you're having problems with the medication(s).  3)  we'll make referral to local GI once reviewed with our doctors. Our office will contact you regarding this appointment once made.  4)  Please schedule a follow-up appointment in 6 months to review, call sooner if problems.  Prescriptions: CLOBETASOL PROPIONATE 0.05 % CREA (CLOBETASOL PROPIONATE) apply to affected skin two times a day as needed  #1 x 1   Entered and Authorized by:   Newt Lukes MD   Signed by:   Newt Lukes MD on 10/16/2010   Method used:   Print then Give to Patient   RxID:   [id number]    Orders Added: 1)  New Patient Level III [44010]

## 2010-10-23 ENCOUNTER — Telehealth (INDEPENDENT_AMBULATORY_CARE_PROVIDER_SITE_OTHER): Payer: Self-pay | Admitting: *Deleted

## 2010-10-23 LAB — CROSSMATCH
Antibody Screen: NEGATIVE
Unit division: 0

## 2010-10-25 LAB — CULTURE, BLOOD (ROUTINE X 2): Culture  Setup Time: 201202131045

## 2010-10-26 ENCOUNTER — Encounter: Payer: Self-pay | Admitting: Internal Medicine

## 2010-10-27 ENCOUNTER — Telehealth: Payer: Self-pay | Admitting: Internal Medicine

## 2010-10-28 NOTE — Progress Notes (Signed)
  Phone Note Other Incoming   Request: Send information Summary of Call: Records received from [facility] ) 3 1/2 inches of paperwork is  being forwarded to  Dr. Felicity Coyer for review.

## 2010-10-29 ENCOUNTER — Telehealth: Payer: Self-pay | Admitting: Gastroenterology

## 2010-10-30 ENCOUNTER — Telehealth (INDEPENDENT_AMBULATORY_CARE_PROVIDER_SITE_OTHER): Payer: Self-pay | Admitting: *Deleted

## 2010-11-03 NOTE — Letter (Signed)
Summary: [facility]  [facility]   Imported By: Sherian Rein 10/29/2010 10:57:33  _____________________________________________________________________  External Attachment:    Type:   Image     Comment:   External Document

## 2010-11-03 NOTE — Letter (Signed)
Summary: Jeanell Sparrow MD  Jeanell Sparrow MD   Imported By: Sherian Rein 10/29/2010 11:01:22  _____________________________________________________________________  External Attachment:    Type:   Image     Comment:   External Document

## 2010-11-03 NOTE — Progress Notes (Signed)
Summary: Follow up  Phone Note Call from Patient Call back at Home Phone [phone]   Caller: Patient Call For: Dr. Arlyce Dice Reason for Call: Talk to Nurse Summary of Call: deborah [phone] from Dr. Felicity Coyer is calling again says they have the patients records and they have no been scanned to EMR yet but she wants to speak with nurse about what all records we need Initial call taken by: [place] Johnson,  October 29, 2010 10:04 AM  Follow-up for Phone Call        Per Dr. Arlyce Dice he will see this patient. Patient has a Location manager in [place]. Dr. Felicity Coyer wants patient seen for end stage hepatic failure. Per Gavin Pound there are patient records from [facility] that have not been scanned yet. Appt made for patient with Dr. Arlyce Dice for 12/04/10 @10am . Gavin Pound will notify patient of appointment date and time. Follow-up by: Selinda Michaels RN,  October 29, 2010 10:54 AM     Appended Document: Follow up Dr. Arlyce Dice reviewed patient records and feels the patient would be better served at a [facility], recommends referral with Dr. Leighton Roach at [facility]. Stanton Kidney in Primary care notified to make referral and records sent back to Primary care.

## 2010-11-03 NOTE — Progress Notes (Signed)
Summary: Referral status  Phone Note Call from Patient Call back at Home Phone [phone]   Caller: Patient Summary of Call: Pt called requesting status of GI referral discussed at OV. Initial call taken by: Margaret Pyle, CMA,  October 27, 2010 4:14 PM  Follow-up for Phone Call        will refer to our group since pt already has hepatologist who follows him regularly Follow-up by: Newt Lukes MD,  October 28, 2010 8:20 AM  Additional Follow-up for Phone Call Additional follow up Details #1::        Pt advised of referral and told to expect a call from [facility] via VM Additional Follow-up by: Margaret Pyle, CMA,  October 28, 2010 8:45 AM

## 2010-11-03 NOTE — Letter (Signed)
Summary: Medical Hx/Patient  Medical Hx/Patient   Imported By: Sherian Rein 10/29/2010 10:29:06  _____________________________________________________________________  External Attachment:    Type:   Image     Comment:   External Document

## 2010-11-03 NOTE — Letter (Signed)
Summary: Dermatology Consult/Simone Creta Levin MD  Dermatology Consult/Simone Creta Levin MD   Imported By: Sherian Rein 10/29/2010 11:06:15  _____________________________________________________________________  External Attachment:    Type:   Image     Comment:   External Document

## 2010-11-03 NOTE — Letter (Signed)
Summary: Renal Consult/[facility]  Renal Consult/[facility]   Imported By: Sherian Rein 10/29/2010 10:55:34  _____________________________________________________________________  External Attachment:    Type:   Image     Comment:   External Document

## 2010-11-03 NOTE — Letter (Signed)
Summary: Medications/Stacy Clent Ridges RN  Medications/Stacy Clent Ridges RN   Imported By: Sherian Rein 10/29/2010 11:11:09  _____________________________________________________________________  External Attachment:    Type:   Image     Comment:   External Document

## 2010-11-03 NOTE — Letter (Signed)
Summary: Medications/Swaytha Evon Slack MD  Rubye Oaks MD   Imported By: Sherian Rein 10/29/2010 11:08:46  _____________________________________________________________________  External Attachment:    Type:   Image     Comment:   External Document

## 2010-11-03 NOTE — Letter (Signed)
Summary: Cherylynn Ridges MD  Cherylynn Ridges MD   Imported By: Sherian Rein 10/29/2010 11:09:50  _____________________________________________________________________  External Attachment:    Type:   Image     Comment:   External Document

## 2010-11-03 NOTE — Letter (Signed)
Summary: Myles Rosenthal MD  Myles Rosenthal MD   Imported By: Sherian Rein 10/29/2010 11:03:52  _____________________________________________________________________  External Attachment:    Type:   Image     Comment:   External Document

## 2010-11-04 ENCOUNTER — Other Ambulatory Visit: Payer: Self-pay | Admitting: Interventional Radiology

## 2010-11-04 DIAGNOSIS — K831 Obstruction of bile duct: Secondary | ICD-10-CM

## 2010-11-05 ENCOUNTER — Emergency Department (HOSPITAL_COMMUNITY): Payer: Medicare (Managed Care)

## 2010-11-05 ENCOUNTER — Inpatient Hospital Stay (HOSPITAL_COMMUNITY)
Admission: EM | Admit: 2010-11-05 | Discharge: 2010-11-08 | DRG: 441 | Disposition: A | Discharge status: Home / Self Care | Payer: Medicare (Managed Care) | Attending: Internal Medicine | Admitting: Internal Medicine

## 2010-11-05 ENCOUNTER — Telehealth: Payer: Self-pay | Admitting: Internal Medicine

## 2010-11-05 DIAGNOSIS — B9689 Other specified bacterial agents as the cause of diseases classified elsewhere: Secondary | ICD-10-CM | POA: Diagnosis present

## 2010-11-05 DIAGNOSIS — J189 Pneumonia, unspecified organism: Secondary | ICD-10-CM | POA: Diagnosis present

## 2010-11-05 DIAGNOSIS — E872 Acidosis, unspecified: Secondary | ICD-10-CM | POA: Diagnosis not present

## 2010-11-05 DIAGNOSIS — D696 Thrombocytopenia, unspecified: Secondary | ICD-10-CM | POA: Diagnosis present

## 2010-11-05 DIAGNOSIS — R680 Hypothermia, not associated with low environmental temperature: Secondary | ICD-10-CM | POA: Diagnosis present

## 2010-11-05 DIAGNOSIS — I8500 Esophageal varices without bleeding: Secondary | ICD-10-CM | POA: Diagnosis present

## 2010-11-05 DIAGNOSIS — K7290 Hepatic failure, unspecified without coma: Principal | ICD-10-CM | POA: Diagnosis present

## 2010-11-05 DIAGNOSIS — D649 Anemia, unspecified: Secondary | ICD-10-CM | POA: Diagnosis present

## 2010-11-05 DIAGNOSIS — Z9119 Patient's noncompliance with other medical treatment and regimen: Secondary | ICD-10-CM

## 2010-11-05 DIAGNOSIS — J329 Chronic sinusitis, unspecified: Secondary | ICD-10-CM | POA: Diagnosis present

## 2010-11-05 DIAGNOSIS — Y830 Surgical operation with transplant of whole organ as the cause of abnormal reaction of the patient, or of later complication, without mention of misadventure at the time of the procedure: Secondary | ICD-10-CM | POA: Diagnosis present

## 2010-11-05 DIAGNOSIS — E860 Dehydration: Secondary | ICD-10-CM | POA: Diagnosis present

## 2010-11-05 DIAGNOSIS — K766 Portal hypertension: Secondary | ICD-10-CM | POA: Diagnosis present

## 2010-11-05 DIAGNOSIS — K7682 Hepatic encephalopathy: Principal | ICD-10-CM | POA: Diagnosis present

## 2010-11-05 DIAGNOSIS — Z91199 Patient's noncompliance with other medical treatment and regimen due to unspecified reason: Secondary | ICD-10-CM

## 2010-11-05 DIAGNOSIS — K7030 Alcoholic cirrhosis of liver without ascites: Secondary | ICD-10-CM | POA: Diagnosis present

## 2010-11-05 DIAGNOSIS — D6959 Other secondary thrombocytopenia: Secondary | ICD-10-CM | POA: Diagnosis present

## 2010-11-05 DIAGNOSIS — N289 Disorder of kidney and ureter, unspecified: Secondary | ICD-10-CM | POA: Diagnosis present

## 2010-11-05 DIAGNOSIS — K831 Obstruction of bile duct: Secondary | ICD-10-CM | POA: Diagnosis present

## 2010-11-05 DIAGNOSIS — F1020 Alcohol dependence, uncomplicated: Secondary | ICD-10-CM | POA: Diagnosis present

## 2010-11-05 DIAGNOSIS — E162 Hypoglycemia, unspecified: Secondary | ICD-10-CM | POA: Diagnosis not present

## 2010-11-05 DIAGNOSIS — E875 Hyperkalemia: Secondary | ICD-10-CM | POA: Diagnosis present

## 2010-11-05 DIAGNOSIS — Z79899 Other long term (current) drug therapy: Secondary | ICD-10-CM

## 2010-11-05 DIAGNOSIS — K769 Liver disease, unspecified: Secondary | ICD-10-CM | POA: Diagnosis present

## 2010-11-05 DIAGNOSIS — T8640 Unspecified complication of liver transplant: Secondary | ICD-10-CM | POA: Diagnosis present

## 2010-11-05 LAB — ETHANOL: Alcohol, Ethyl (B): <5 mg/dL (ref 0–10)

## 2010-11-05 LAB — PROTIME-INR
INR: 1.86 — ABNORMAL HIGH (ref 0.00–1.49)
Prothrombin Time: 21.6 seconds — ABNORMAL HIGH (ref 11.6–15.2)

## 2010-11-05 LAB — DIFFERENTIAL
Basophils Relative: 1 % (ref 0–1)
Eosinophils Absolute: 0.3 10*3/uL (ref 0.0–0.7)
Lymphocytes Relative: 15 % (ref 12–46)
Monocytes Relative: 6 % (ref 3–12)
Neutro Abs: 3.7 10*3/uL (ref 1.7–7.7)
Neutrophils Relative %: 73 % (ref 43–77)

## 2010-11-05 LAB — MAGNESIUM: Magnesium: 2.6 mg/dL — ABNORMAL HIGH (ref 1.5–2.5)

## 2010-11-05 LAB — URINALYSIS, ROUTINE W REFLEX MICROSCOPIC
Leukocytes, UA: NEGATIVE
Nitrite: NEGATIVE
Specific Gravity, Urine: 1.009 (ref 1.005–1.030)
Urine Glucose, Fasting: NEGATIVE mg/dL
pH: 8 (ref 5.0–8.0)

## 2010-11-05 LAB — COMPREHENSIVE METABOLIC PANEL
ALT: 20 U/L (ref 0–53)
BUN: 26 mg/dL — ABNORMAL HIGH (ref 6–23)
CO2: 19 mEq/L (ref 19–32)
Calcium: 7.4 mg/dL — ABNORMAL LOW (ref 8.4–10.5)
Creatinine, Ser: 1.47 mg/dL (ref 0.4–1.5)
GFR calc non Af Amer: 49 mL/min — ABNORMAL LOW (ref >60)
Glucose, Bld: 103 mg/dL — ABNORMAL HIGH (ref 70–99)

## 2010-11-05 LAB — PROCALCITONIN: Procalcitonin: 0.12 ng/mL

## 2010-11-05 LAB — CBC
Hemoglobin: 9 g/dL — ABNORMAL LOW (ref 13.0–17.0)
MCV: 98.6 fL (ref 78.0–100.0)
Platelets: 35 10*3/uL — ABNORMAL LOW (ref 150–400)
RBC: 2.77 MIL/uL — ABNORMAL LOW (ref 4.22–5.81)
WBC: 5.2 10*3/uL (ref 4.0–10.5)

## 2010-11-05 LAB — AMMONIA: Ammonia: 170 umol/L — ABNORMAL HIGH (ref 11–35)

## 2010-11-05 LAB — POTASSIUM: Potassium: 6.4 mEq/L (ref 3.5–5.1)

## 2010-11-05 LAB — GLUCOSE, CAPILLARY: Glucose-Capillary: 94 mg/dL (ref 70–99)

## 2010-11-05 LAB — URINE MICROSCOPIC-ADD ON

## 2010-11-06 DIAGNOSIS — K7290 Hepatic failure, unspecified without coma: Secondary | ICD-10-CM

## 2010-11-06 DIAGNOSIS — K7030 Alcoholic cirrhosis of liver without ascites: Secondary | ICD-10-CM

## 2010-11-06 DIAGNOSIS — I959 Hypotension, unspecified: Secondary | ICD-10-CM

## 2010-11-06 LAB — COMPREHENSIVE METABOLIC PANEL
BUN: 26 mg/dL — ABNORMAL HIGH (ref 6–23)
Calcium: 7.6 mg/dL — ABNORMAL LOW (ref 8.4–10.5)
Glucose, Bld: 128 mg/dL — ABNORMAL HIGH (ref 70–99)
Sodium: 137 mEq/L (ref 135–145)
Total Protein: 7 g/dL (ref 6.0–8.3)

## 2010-11-06 LAB — DIFFERENTIAL
Basophils Absolute: 0 10*3/uL (ref 0.0–0.1)
Eosinophils Absolute: 0.1 10*3/uL (ref 0.0–0.7)
Lymphocytes Relative: 9 % — ABNORMAL LOW (ref 12–46)
Neutro Abs: 4.1 10*3/uL (ref 1.7–7.7)

## 2010-11-06 LAB — MRSA PCR SCREENING: MRSA by PCR: NEGATIVE

## 2010-11-06 LAB — CARDIAC PANEL(CRET KIN+CKTOT+MB+TROPI)
CK, MB: 0.8 ng/mL (ref 0.3–4.0)
Total CK: 30 U/L (ref 7–232)
Troponin I: 0.01 ng/mL (ref 0.00–0.06)

## 2010-11-06 LAB — GLUCOSE, CAPILLARY
Glucose-Capillary: 119 mg/dL — ABNORMAL HIGH (ref 70–99)
Glucose-Capillary: 113 mg/dL — ABNORMAL HIGH (ref 70–99)

## 2010-11-06 LAB — CK TOTAL AND CKMB (NOT AT ARMC): CK, MB: 0.8 ng/mL (ref 0.3–4.0)

## 2010-11-06 LAB — CBC
MCH: 32.1 pg (ref 26.0–34.0)
MCV: 98.8 fL (ref 78.0–100.0)
Platelets: 30 10*3/uL — ABNORMAL LOW (ref 150–400)
RDW: 18 % — ABNORMAL HIGH (ref 11.5–15.5)
WBC: 4.9 10*3/uL (ref 4.0–10.5)

## 2010-11-06 LAB — CORTISOL: Cortisol, Plasma: 11.7 ug/dL

## 2010-11-06 LAB — TROPONIN I: Troponin I: 0.01 ng/mL (ref 0.00–0.06)

## 2010-11-07 LAB — COMPREHENSIVE METABOLIC PANEL
ALT: 21 U/L (ref 0–53)
AST: 34 U/L (ref 0–37)
Albumin: 1.4 g/dL — ABNORMAL LOW (ref 3.5–5.2)
Alkaline Phosphatase: 108 U/L (ref 39–117)
Chloride: 113 mEq/L — ABNORMAL HIGH (ref 96–112)
Potassium: 4.7 mEq/L (ref 3.5–5.1)
Sodium: 133 mEq/L — ABNORMAL LOW (ref 135–145)
Total Bilirubin: 2.7 mg/dL — ABNORMAL HIGH (ref 0.3–1.2)
Total Protein: 6.7 g/dL (ref 6.0–8.3)

## 2010-11-07 LAB — TACROLIMUS, BLOOD: Tacrolimus Lvl: 3.1 ng/mL — ABNORMAL LOW (ref 5.0–20.0)

## 2010-11-07 LAB — GLUCOSE, CAPILLARY
Glucose-Capillary: 126 mg/dL — ABNORMAL HIGH (ref 70–99)
Glucose-Capillary: 136 mg/dL — ABNORMAL HIGH (ref 70–99)
Glucose-Capillary: 142 mg/dL — ABNORMAL HIGH (ref 70–99)
Glucose-Capillary: 90 mg/dL (ref 70–99)
Glucose-Capillary: 99 mg/dL (ref 70–99)

## 2010-11-07 LAB — CBC
HCT: 22.7 % — ABNORMAL LOW (ref 39.0–52.0)
Hemoglobin: 7.4 g/dL — ABNORMAL LOW (ref 13.0–17.0)
MCH: 32.5 pg (ref 26.0–34.0)
MCHC: 32.9 g/dL (ref 30.0–36.0)
MCV: 98.8 fL (ref 78.0–100.0)
Platelets: 35 10*3/uL — ABNORMAL LOW (ref 150–400)
RBC: 2.27 MIL/uL — ABNORMAL LOW (ref 4.22–5.81)
RBC: 2.4 MIL/uL — ABNORMAL LOW (ref 4.22–5.81)

## 2010-11-07 LAB — AMMONIA: Ammonia: 80 umol/L — ABNORMAL HIGH (ref 11–35)

## 2010-11-07 LAB — LACTIC ACID, PLASMA: Lactic Acid, Venous: 1 mmol/L (ref 0.5–2.2)

## 2010-11-08 LAB — COMPREHENSIVE METABOLIC PANEL
ALT: 22 U/L (ref 0–53)
AST: 37 U/L (ref 0–37)
Albumin: 1.4 g/dL — ABNORMAL LOW (ref 3.5–5.2)
Alkaline Phosphatase: 108 U/L (ref 39–117)
BUN: 22 mg/dL (ref 6–23)
CO2: 18 mEq/L — ABNORMAL LOW (ref 19–32)
Calcium: 7.3 mg/dL — ABNORMAL LOW (ref 8.4–10.5)
Chloride: 111 mEq/L (ref 96–112)
Creatinine, Ser: 1.33 mg/dL (ref 0.4–1.5)
GFR calc Af Amer: >60 mL/min (ref >60)
GFR calc non Af Amer: 55 mL/min — ABNORMAL LOW (ref >60)
Glucose, Bld: 100 mg/dL — ABNORMAL HIGH (ref 70–99)
Potassium: 4.7 mEq/L (ref 3.5–5.1)
Sodium: 132 mEq/L — ABNORMAL LOW (ref 135–145)
Total Bilirubin: 2.7 mg/dL — ABNORMAL HIGH (ref 0.3–1.2)
Total Protein: 7 g/dL (ref 6.0–8.3)

## 2010-11-08 LAB — CBC
Hemoglobin: 7.5 g/dL — ABNORMAL LOW (ref 13.0–17.0)
MCH: 32.2 pg (ref 26.0–34.0)
MCV: 97.9 fL (ref 78.0–100.0)
RBC: 2.33 MIL/uL — ABNORMAL LOW (ref 4.22–5.81)

## 2010-11-09 LAB — BODY FLUID CULTURE

## 2010-11-11 ENCOUNTER — Telehealth (INDEPENDENT_AMBULATORY_CARE_PROVIDER_SITE_OTHER): Payer: Self-pay | Admitting: *Deleted

## 2010-11-12 ENCOUNTER — Other Ambulatory Visit (HOSPITAL_COMMUNITY): Payer: Medicare (Managed Care)

## 2010-11-12 LAB — CULTURE, BLOOD (ROUTINE X 2)
Culture  Setup Time: 201203020129
Culture: NO GROWTH

## 2010-11-12 NOTE — Progress Notes (Signed)
Summary: est "local" GI care - ?[facility]  Phone Note Other Incoming   Caller: Dr. Scherrie Merritts [phone] Summary of Call: Requesting to speak with Dr. Felicity Coyer concerning patient Initial call taken by: Orlan Leavens RMA,  November 05, 2010 12:53 PM  Follow-up for Phone Call        i returned call and discussed pt with dr. Jacqualine Mau  - he feels best place to follow pt for "local" care would be [facility] given presence in [place] is only one day per week. if pt willing to go to [facility], dr. Jacqualine Mau will arrange for new pt consultation there - i then called pt at listed # at [facility] re: same and have been unable to arrange local GI care within [place] at this time. pt is to return call to let us know if he is willing to go to [facility] or [facility]. thanks Follow-up by: Newt Lukes MD,  November 05, 2010 1:42 PM

## 2010-11-12 NOTE — Progress Notes (Signed)
Summary: referral   Phone Note From Other Clinic   Caller: Nurse Call For: Dr Felicity Coyer  Summary of Call: Dr Patsey Berthold pt appt was  scheduled with  Dr. Arlyce Dice for 12/04/10 @10am .  Per Dr Arlyce Dice Nurse after Dr Arlyce Dice reviewed  pt records he suggest that this pt see an  Hepatologist @ [facility]  He recommeded  Dr . Sherrie Mustache D. Katrinka Blazing and his appt was cancel with Dr Arlyce Dice informed pt of this , Do you want this pt to go to [facility] pls advise  Initial call taken by: Shelbie Proctor,  October 30, 2010 8:48 AM  Follow-up for Phone Call        no, i do not want pt to go to [facility] - i will d/w our GI docs in person - thanks Follow-up by: Newt Lukes MD,  November 02, 2010 8:11 AM  Additional Follow-up for Phone Call Additional follow up Details #1::        please try the hepatology clinic here in [place] - new referral needed for GI eval done -  Additional Follow-up by: Newt Lukes MD,  November 02, 2010 1:24 PM    Additional Follow-up for Phone Call Additional follow up Details #2::    Awaiting appt faxed referral and ov notes [facility]  [address]. [place], [place] [address]. Phone: [phone]. Fax: [phone].  Follow-up by: Shelbie Proctor,  November 02, 2010 1:58 PM

## 2010-11-13 ENCOUNTER — Encounter: Payer: Self-pay | Admitting: Internal Medicine

## 2010-11-13 ENCOUNTER — Ambulatory Visit (HOSPITAL_COMMUNITY)
Admission: RE | Admit: 2010-11-13 | Discharge: 2010-11-13 | Disposition: A | Discharge status: Home / Self Care | Payer: Medicare (Managed Care) | Source: Ambulatory Visit | Attending: Interventional Radiology | Admitting: Interventional Radiology

## 2010-11-13 DIAGNOSIS — Z4682 Encounter for fitting and adjustment of non-vascular catheter: Secondary | ICD-10-CM | POA: Insufficient documentation

## 2010-11-13 DIAGNOSIS — K831 Obstruction of bile duct: Secondary | ICD-10-CM | POA: Insufficient documentation

## 2010-11-13 LAB — CULTURE, BLOOD (ROUTINE X 2): Culture: NO GROWTH

## 2010-11-13 MED ORDER — IOHEXOL 300 MG/ML  SOLN
10.0000 mL | Freq: Once | INTRAMUSCULAR | Status: AC | PRN
  Administered 2010-11-13: 10 mL

## 2010-11-16 LAB — CBC
HCT: 20 % — ABNORMAL LOW (ref 39.0–52.0)
HCT: 20.3 % — ABNORMAL LOW (ref 39.0–52.0)
HCT: 20.9 % — ABNORMAL LOW (ref 39.0–52.0)
HCT: 21.6 % — ABNORMAL LOW (ref 39.0–52.0)
HCT: 21.9 % — ABNORMAL LOW (ref 39.0–52.0)
Hemoglobin: 7 g/dL — ABNORMAL LOW (ref 13.0–17.0)
Hemoglobin: 7.2 g/dL — ABNORMAL LOW (ref 13.0–17.0)
Hemoglobin: 7.2 g/dL — ABNORMAL LOW (ref 13.0–17.0)
Hemoglobin: 7.3 g/dL — ABNORMAL LOW (ref 13.0–17.0)
Hemoglobin: 8.5 g/dL — ABNORMAL LOW (ref 13.0–17.0)
MCH: 34.1 pg — ABNORMAL HIGH (ref 26.0–34.0)
MCH: 34.6 pg — ABNORMAL HIGH (ref 26.0–34.0)
MCHC: 34.3 g/dL (ref 30.0–36.0)
MCHC: 34.3 g/dL (ref 30.0–36.0)
MCHC: 34.4 g/dL (ref 30.0–36.0)
MCHC: 35.5 g/dL (ref 30.0–36.0)
MCV: 100.9 fL — ABNORMAL HIGH (ref 78.0–100.0)
MCV: 96.2 fL (ref 78.0–100.0)
MCV: 98.6 fL (ref 78.0–100.0)
Platelets: 22 10*3/uL — CL (ref 150–400)
Platelets: 49 10*3/uL — ABNORMAL LOW (ref 150–400)
Platelets: 54 10*3/uL — ABNORMAL LOW (ref 150–400)
RBC: 2.03 MIL/uL — ABNORMAL LOW (ref 4.22–5.81)
RBC: 2.11 MIL/uL — ABNORMAL LOW (ref 4.22–5.81)
RBC: 2.13 MIL/uL — ABNORMAL LOW (ref 4.22–5.81)
RBC: 2.13 MIL/uL — ABNORMAL LOW (ref 4.22–5.81)
RBC: 2.22 MIL/uL — ABNORMAL LOW (ref 4.22–5.81)
RDW: 15.8 % — ABNORMAL HIGH (ref 11.5–15.5)
RDW: 16.7 % — ABNORMAL HIGH (ref 11.5–15.5)
RDW: 17 % — ABNORMAL HIGH (ref 11.5–15.5)
WBC: 3.9 10*3/uL — ABNORMAL LOW (ref 4.0–10.5)
WBC: 4 10*3/uL (ref 4.0–10.5)
WBC: 7.6 10*3/uL (ref 4.0–10.5)
WBC: 7.7 10*3/uL (ref 4.0–10.5)

## 2010-11-16 LAB — AMMONIA
Ammonia: 405 umol/L — ABNORMAL HIGH (ref 11–35)
Ammonia: 44 umol/L — ABNORMAL HIGH (ref 11–35)
Ammonia: 80 umol/L — ABNORMAL HIGH (ref 11–35)

## 2010-11-16 LAB — COMPREHENSIVE METABOLIC PANEL
ALT: 17 U/L (ref 0–53)
ALT: 18 U/L (ref 0–53)
ALT: 18 U/L (ref 0–53)
ALT: 23 U/L (ref 0–53)
ALT: 25 U/L (ref 0–53)
AST: 28 U/L (ref 0–37)
AST: 31 U/L (ref 0–37)
AST: 42 U/L — ABNORMAL HIGH (ref 0–37)
AST: 55 U/L — ABNORMAL HIGH (ref 0–37)
Albumin: 1.6 g/dL — ABNORMAL LOW (ref 3.5–5.2)
Albumin: 1.9 g/dL — ABNORMAL LOW (ref 3.5–5.2)
Alkaline Phosphatase: 119 U/L — ABNORMAL HIGH (ref 39–117)
Alkaline Phosphatase: 85 U/L (ref 39–117)
Alkaline Phosphatase: 89 U/L (ref 39–117)
BUN: 24 mg/dL — ABNORMAL HIGH (ref 6–23)
BUN: 24 mg/dL — ABNORMAL HIGH (ref 6–23)
BUN: 37 mg/dL — ABNORMAL HIGH (ref 6–23)
CO2: 15 mEq/L — ABNORMAL LOW (ref 19–32)
CO2: 17 mEq/L — ABNORMAL LOW (ref 19–32)
CO2: 20 mEq/L (ref 19–32)
CO2: 20 mEq/L (ref 19–32)
CO2: 21 mEq/L (ref 19–32)
Calcium: 7.1 mg/dL — ABNORMAL LOW (ref 8.4–10.5)
Calcium: 7.1 mg/dL — ABNORMAL LOW (ref 8.4–10.5)
Calcium: 7.3 mg/dL — ABNORMAL LOW (ref 8.4–10.5)
Calcium: 7.6 mg/dL — ABNORMAL LOW (ref 8.4–10.5)
Chloride: 105 mEq/L (ref 96–112)
Chloride: 119 mEq/L — ABNORMAL HIGH (ref 96–112)
Chloride: 121 mEq/L — ABNORMAL HIGH (ref 96–112)
Creatinine, Ser: 1.55 mg/dL — ABNORMAL HIGH (ref 0.4–1.5)
GFR calc Af Amer: 52 mL/min — ABNORMAL LOW (ref >60)
GFR calc Af Amer: >60 mL/min (ref >60)
GFR calc Af Amer: >60 mL/min (ref >60)
GFR calc non Af Amer: 43 mL/min — ABNORMAL LOW (ref >60)
GFR calc non Af Amer: 43 mL/min — ABNORMAL LOW (ref >60)
GFR calc non Af Amer: 49 mL/min — ABNORMAL LOW (ref >60)
GFR calc non Af Amer: >60 mL/min (ref >60)
GFR calc non Af Amer: >60 mL/min (ref >60)
Glucose, Bld: 101 mg/dL — ABNORMAL HIGH (ref 70–99)
Glucose, Bld: 104 mg/dL — ABNORMAL HIGH (ref 70–99)
Glucose, Bld: 106 mg/dL — ABNORMAL HIGH (ref 70–99)
Glucose, Bld: 124 mg/dL — ABNORMAL HIGH (ref 70–99)
Potassium: 3.7 mEq/L (ref 3.5–5.1)
Potassium: 5.5 mEq/L — ABNORMAL HIGH (ref 3.5–5.1)
Potassium: 6.6 mEq/L (ref 3.5–5.1)
Sodium: 128 mEq/L — ABNORMAL LOW (ref 135–145)
Sodium: 129 mEq/L — ABNORMAL LOW (ref 135–145)
Sodium: 130 mEq/L — ABNORMAL LOW (ref 135–145)
Sodium: 133 mEq/L — ABNORMAL LOW (ref 135–145)
Sodium: 137 mEq/L (ref 135–145)
Sodium: 140 mEq/L (ref 135–145)
Total Bilirubin: 1.6 mg/dL — ABNORMAL HIGH (ref 0.3–1.2)
Total Bilirubin: 1.9 mg/dL — ABNORMAL HIGH (ref 0.3–1.2)
Total Bilirubin: 2.5 mg/dL — ABNORMAL HIGH (ref 0.3–1.2)
Total Protein: 5.8 g/dL — ABNORMAL LOW (ref 6.0–8.3)
Total Protein: 6.1 g/dL (ref 6.0–8.3)
Total Protein: 8.1 g/dL (ref 6.0–8.3)

## 2010-11-16 LAB — ACETAMINOPHEN LEVEL
Acetaminophen (Tylenol), Serum: <10 ug/mL — ABNORMAL LOW (ref 10–30)
Acetaminophen (Tylenol), Serum: <10 ug/mL — ABNORMAL LOW (ref 10–30)

## 2010-11-16 LAB — GLUCOSE, CAPILLARY
Glucose-Capillary: 105 mg/dL — ABNORMAL HIGH (ref 70–99)
Glucose-Capillary: 113 mg/dL — ABNORMAL HIGH (ref 70–99)
Glucose-Capillary: 116 mg/dL — ABNORMAL HIGH (ref 70–99)
Glucose-Capillary: 121 mg/dL — ABNORMAL HIGH (ref 70–99)
Glucose-Capillary: 129 mg/dL — ABNORMAL HIGH (ref 70–99)
Glucose-Capillary: 141 mg/dL — ABNORMAL HIGH (ref 70–99)
Glucose-Capillary: 97 mg/dL (ref 70–99)

## 2010-11-16 LAB — PHOSPHORUS
Phosphorus: 2.9 mg/dL (ref 2.3–4.6)
Phosphorus: 3.3 mg/dL (ref 2.3–4.6)
Phosphorus: 3.8 mg/dL (ref 2.3–4.6)
Phosphorus: 4.1 mg/dL (ref 2.3–4.6)

## 2010-11-16 LAB — MAGNESIUM
Magnesium: 1.3 mg/dL — ABNORMAL LOW (ref 1.5–2.5)
Magnesium: 1.7 mg/dL (ref 1.5–2.5)
Magnesium: 1.8 mg/dL (ref 1.5–2.5)
Magnesium: 2.1 mg/dL (ref 1.5–2.5)

## 2010-11-16 LAB — POCT I-STAT 3, ART BLOOD GAS (G3+)
Acid-base deficit: 11 mmol/L — ABNORMAL HIGH (ref 0.0–2.0)
Patient temperature: 99.1

## 2010-11-16 LAB — CULTURE, BLOOD (ROUTINE X 2)
Culture  Setup Time: 201112250026
Culture  Setup Time: 201112250027
Culture: NO GROWTH

## 2010-11-16 LAB — BASIC METABOLIC PANEL
CO2: 17 mEq/L — ABNORMAL LOW (ref 19–32)
Chloride: 113 mEq/L — ABNORMAL HIGH (ref 96–112)
Chloride: 121 mEq/L — ABNORMAL HIGH (ref 96–112)
Creatinine, Ser: 1.32 mg/dL (ref 0.4–1.5)
GFR calc Af Amer: 57 mL/min — ABNORMAL LOW (ref >60)
GFR calc Af Amer: >60 mL/min (ref >60)
Potassium: 4 mEq/L (ref 3.5–5.1)
Potassium: 4.9 mEq/L (ref 3.5–5.1)
Sodium: 134 mEq/L — ABNORMAL LOW (ref 135–145)
Sodium: 140 mEq/L (ref 135–145)

## 2010-11-16 LAB — MRSA PCR SCREENING: MRSA by PCR: NEGATIVE

## 2010-11-16 LAB — URINE DRUGS OF ABUSE SCREEN W ALC, ROUTINE (REF LAB)
Benzodiazepines.: NEGATIVE
Marijuana Metabolite: NEGATIVE
Opiate Screen, Urine: NEGATIVE
Propoxyphene: NEGATIVE

## 2010-11-16 LAB — URINALYSIS, ROUTINE W REFLEX MICROSCOPIC
Ketones, ur: NEGATIVE mg/dL
Leukocytes, UA: NEGATIVE
Nitrite: NEGATIVE
Protein, ur: NEGATIVE mg/dL

## 2010-11-16 LAB — PROTIME-INR
INR: 1.92 — ABNORMAL HIGH (ref 0.00–1.49)
INR: 2.14 — ABNORMAL HIGH (ref 0.00–1.49)
INR: 2.57 — ABNORMAL HIGH (ref 0.00–1.49)
Prothrombin Time: 27.6 seconds — ABNORMAL HIGH (ref 11.6–15.2)
Prothrombin Time: 32 seconds — ABNORMAL HIGH (ref 11.6–15.2)

## 2010-11-16 LAB — URINE CULTURE: Culture  Setup Time: 201112242052

## 2010-11-16 LAB — LACTIC ACID, PLASMA: Lactic Acid, Venous: 1.5 mmol/L (ref 0.5–2.2)

## 2010-11-16 LAB — POTASSIUM: Potassium: 6.5 mEq/L (ref 3.5–5.1)

## 2010-11-16 LAB — POCT CARDIAC MARKERS
CKMB, poc: 1.6 ng/mL (ref 1.0–8.0)
Myoglobin, poc: 164 ng/mL (ref 12–200)
Troponin i, poc: <0.05 ng/mL (ref 0.00–0.09)

## 2010-11-16 LAB — STREP PNEUMONIAE URINARY ANTIGEN: Strep Pneumo Urinary Antigen: NEGATIVE

## 2010-11-16 LAB — APTT: aPTT: 49 seconds — ABNORMAL HIGH (ref 24–37)

## 2010-11-16 LAB — POCT I-STAT 3, VENOUS BLOOD GAS (G3P V)
Bicarbonate: 15 mEq/L — ABNORMAL LOW (ref 20.0–24.0)
pH, Ven: 7.487 — ABNORMAL HIGH (ref 7.250–7.300)
pO2, Ven: 43 mmHg (ref 30.0–45.0)

## 2010-11-16 LAB — DIFFERENTIAL
Basophils Relative: 1 % (ref 0–1)
Eosinophils Relative: 4 % (ref 0–5)
Lymphs Abs: 0.6 10*3/uL — ABNORMAL LOW (ref 0.7–4.0)
Monocytes Absolute: 0.3 10*3/uL (ref 0.1–1.0)

## 2010-11-16 LAB — ETHANOL: Alcohol, Ethyl (B): <5 mg/dL (ref 0–10)

## 2010-11-16 LAB — LEGIONELLA ANTIGEN, URINE: Legionella Antigen, Urine: NEGATIVE

## 2010-11-16 NOTE — Discharge Summary (Signed)
[name] [name]            ACCOUNT NO.:  [id number]  MEDICAL RECORD NO.:  [id number]           PATIENT TYPE:  I  LOCATION:  1502                         FACILITY:  [facility]  PHYSICIAN:  Pincus Large, MD     DATE OF BIRTH:  May 19, 1951  DATE OF ADMISSION:  10/18/2010 DATE OF DISCHARGE:  10/22/2010                              DISCHARGE SUMMARY   PRIMARY CARE PHYSICIAN:  The patient goes to [facility] for his liver transplant followup.  REASON FOR ADMISSION:  Altered mental status.  DISCHARGE DIAGNOSES: 1. Altered mental status secondary to hepatic encephalopathy,     resolved. 2. Medication noncompliance. 3. 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 4. 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 5. Portal hypertension. 6. Sinusitis. 7. Grade 4 esophageal varices. 8. Hypomagnesemia. 9. Non-anion gap acidosis. 10.Anemia. 11.Thrombocytopenia.  CONSULTANT:  The patient was admitted first to ICU, CCM.  DISCHARGE MEDICATIONS: 1. Neomycin sulfate 500 mg t.i.d. 2. Ferrous sulfate 325 daily. 3. Lasix 40 b.i.d. 4. Lactulose 10 g/15 mL, take 30 mL by mouth 4 times a day. 5. Magnesium oxide 400 mg 1 tablet t.i.d. 6. Nadolol 20 mg at bedtime. 7. Omeprazole 20 mg daily. 8. Prograf 0.5 mg every evening. 9. Sodium bicarb 650 mg 3 tablets t.i.d. 10.Spironolactone 50 mg p.o. b.i.d. 11.Vitamin C 1 tablet daily. 12.Rifaximin 1 tablet by mouth daily. 13.Zinc 220 mg 1 capsule b.i.d.  Images that were done, chest x-ray which shows there is no marked increase in the left effusion or airspace disease, mild right basilar subsegmental atelectasis noted.  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  His ammonia level was 235, which went down to 60 after lactulose and Kayexalate.  The patient was admitted to Critical Care for 2 days and he was transferred to medicine service as of October 21, 2010.  The patient had some frank purulent fluid drawn out of his abdominal drain.  Culture came back polymicrobial.  The patient remained afebrile.  The patient was started empirically on vancomycin and Zosyn, but all culture came back negative.  The patient's ammonia level went down to 60.  On February 14th, his hemoglobin dropped down to 6.9 and he had 2 units of blood transfusion.  LABORATORY DATA:  His lab today shows WBCs 4.4, hemoglobin 7.8, platelets 46.  Sodium is 135, potassium is 4.8, bicarb is 19, his chloride is 115, glucose 113, BUN is 14, creatinine is 1.9.  His calcium is 7.9, albumin is  1.4, ammonia 60.  PHYSICAL EXAMINATION:  VITAL SIGNS:  Temperature is 98.6, pulse 70, respiration is 18, blood pressure 109/61.  The patient wanted to go home today.  He said he is going to follow up with his [facility] liver transplant surgeon, [name] [name], in 10 days. He is insisting that he wants to go home.  HOSPITAL COURSE BY  DISCHARGE DIAGNOSES: 1. Hepatic encephalopathy.  After further evaluation, [name] [name]     reported, following return to normal current status, that he has     been trying to alter his home GI regimen reporting he had been     alternating his neomycin with Rifaximin administration.  Also, he     has been alternating lactulose with Kayexalate.  His ammonia level     went down to 60 as of today. 2. History of end-stage hepatic disease secondary to alcoholism,     status post liver transplant.  His INR is 1.5.  His  AST is 30 and     ALT is 19.  His albumin is 1.4.  He was discharged home on his     regular regimen and he probably has to see the transplant center at     [facility].  He said he spoke with [name] [name], he is      going to see him in 10 days. 3. Non-anion gap acidosis.  Chronic in the setting of his diarrhea and     home lactulose, continuous bicarb supplement. 4. Chronic anemia.  Currently, his hemoglobin is 7.9.  No evidence of     bleeding.  We continue with iron supplement. 5. Thrombocytopenia, stable.  No evidence of bleeding. 6. History of chronic biliary and chronic peritoneal catheter.  I do     not see any evidence of infection.  His culture shows     polymicrobial.  The patient remained afebrile for 48 hours.  DISCHARGE INSTRUCTIONS:  Diet as tolerated.  DISCHARGE FOLLOWUP:  The patient is to follow up with [facility].          ______________________________ Pincus Large, MD     SA/MEDQ  D:  10/22/2010  T:  10/23/2010  Job:  [id number]  Electronically Signed by Pincus Large MD on 11/16/2010 05:58:06 PM

## 2010-11-18 LAB — COMPREHENSIVE METABOLIC PANEL
Alkaline Phosphatase: 127 U/L — ABNORMAL HIGH (ref 39–117)
BUN: 16 mg/dL (ref 6–23)
CO2: 18 mEq/L — ABNORMAL LOW (ref 19–32)
Calcium: 7.5 mg/dL — ABNORMAL LOW (ref 8.4–10.5)
Chloride: 109 mEq/L (ref 96–112)
Chloride: 118 mEq/L — ABNORMAL HIGH (ref 96–112)
Creatinine, Ser: 1.19 mg/dL (ref 0.4–1.5)
Creatinine, Ser: 1.2 mg/dL (ref 0.4–1.5)
GFR calc Af Amer: >60 mL/min (ref >60)
GFR calc non Af Amer: >60 mL/min (ref >60)
GFR calc non Af Amer: >60 mL/min (ref >60)
Glucose, Bld: 109 mg/dL — ABNORMAL HIGH (ref 70–99)
Glucose, Bld: 93 mg/dL (ref 70–99)
Potassium: 4.3 mEq/L (ref 3.5–5.1)
Potassium: 5.1 mEq/L (ref 3.5–5.1)
Sodium: 137 mEq/L (ref 135–145)
Total Bilirubin: 1.6 mg/dL — ABNORMAL HIGH (ref 0.3–1.2)
Total Bilirubin: 2.2 mg/dL — ABNORMAL HIGH (ref 0.3–1.2)

## 2010-11-18 LAB — URINALYSIS, ROUTINE W REFLEX MICROSCOPIC
Bilirubin Urine: NEGATIVE
Ketones, ur: NEGATIVE mg/dL
Nitrite: NEGATIVE
Specific Gravity, Urine: 1.016 (ref 1.005–1.030)
pH: 8 (ref 5.0–8.0)

## 2010-11-18 LAB — DIFFERENTIAL
Basophils Absolute: 0 10*3/uL (ref 0.0–0.1)
Basophils Absolute: 0 10*3/uL (ref 0.0–0.1)
Basophils Relative: 0 % (ref 0–1)
Eosinophils Absolute: 0.2 K/uL (ref 0.0–0.7)
Eosinophils Relative: 5 % (ref 0–5)
Eosinophils Relative: 6 % — ABNORMAL HIGH (ref 0–5)
Lymphocytes Relative: 16 % (ref 12–46)
Lymphs Abs: 0.5 K/uL — ABNORMAL LOW (ref 0.7–4.0)
Monocytes Absolute: 0.3 10*3/uL (ref 0.1–1.0)
Monocytes Absolute: 0.3 10*3/uL (ref 0.1–1.0)
Monocytes Relative: 11 % (ref 3–12)
Monocytes Relative: 7 % (ref 3–12)
Neutro Abs: 2 10*3/uL (ref 1.7–7.7)
Neutrophils Relative %: 67 % (ref 43–77)
Neutrophils Relative %: 77 % (ref 43–77)

## 2010-11-18 LAB — CBC
HCT: 23.2 % — ABNORMAL LOW (ref 39.0–52.0)
Hemoglobin: 8 g/dL — ABNORMAL LOW (ref 13.0–17.0)
MCH: 35.6 pg — ABNORMAL HIGH (ref 26.0–34.0)
MCHC: 34.4 g/dL (ref 30.0–36.0)
MCV: 103.3 fL — ABNORMAL HIGH (ref 78.0–100.0)
Platelets: 30 10*3/uL — ABNORMAL LOW (ref 150–400)
Platelets: 46 10*3/uL — ABNORMAL LOW (ref 150–400)
RBC: 2.24 MIL/uL — ABNORMAL LOW (ref 4.22–5.81)
RBC: 2.44 MIL/uL — ABNORMAL LOW (ref 4.22–5.81)
RDW: 16.9 % — ABNORMAL HIGH (ref 11.5–15.5)
RDW: 17.3 % — ABNORMAL HIGH (ref 11.5–15.5)
WBC: 3 10*3/uL — ABNORMAL LOW (ref 4.0–10.5)
WBC: 4.5 10*3/uL (ref 4.0–10.5)

## 2010-11-18 LAB — COMPREHENSIVE METABOLIC PANEL WITH GFR
ALT: 15 U/L (ref 0–53)
AST: 33 U/L (ref 0–37)
Albumin: 1.8 g/dL — ABNORMAL LOW (ref 3.5–5.2)
CO2: 25 meq/L (ref 19–32)
Calcium: 7.4 mg/dL — ABNORMAL LOW (ref 8.4–10.5)
GFR calc Af Amer: >60 mL/min (ref >60)
Sodium: 135 meq/L (ref 135–145)
Total Protein: 7.4 g/dL (ref 6.0–8.3)

## 2010-11-18 LAB — CK TOTAL AND CKMB (NOT AT ARMC)
CK, MB: 0.5 ng/mL (ref 0.3–4.0)
Relative Index: INVALID (ref 0.0–2.5)
Total CK: 42 U/L (ref 7–232)

## 2010-11-18 LAB — AMMONIA: Ammonia: 63 umol/L — ABNORMAL HIGH (ref 11–35)

## 2010-11-18 LAB — APTT: aPTT: 44 seconds — ABNORMAL HIGH (ref 24–37)

## 2010-11-18 LAB — BRAIN NATRIURETIC PEPTIDE: Pro B Natriuretic peptide (BNP): 184 pg/mL — ABNORMAL HIGH (ref 0.0–100.0)

## 2010-11-18 LAB — TROPONIN I: Troponin I: 0.02 ng/mL (ref 0.00–0.06)

## 2010-11-18 LAB — LIPASE, BLOOD: Lipase: 46 U/L (ref 11–59)

## 2010-11-18 LAB — LACTIC ACID, PLASMA: Lactic Acid, Venous: 1.1 mmol/L (ref 0.5–2.2)

## 2010-11-18 LAB — URINE MICROSCOPIC-ADD ON

## 2010-11-23 ENCOUNTER — Ambulatory Visit (HOSPITAL_COMMUNITY)
Admission: RE | Admit: 2010-11-23 | Discharge: 2010-11-23 | Disposition: A | Discharge status: Home / Self Care | Payer: Medicare (Managed Care) | Source: Ambulatory Visit | Attending: Interventional Radiology | Admitting: Interventional Radiology

## 2010-11-23 ENCOUNTER — Other Ambulatory Visit (HOSPITAL_COMMUNITY): Payer: Self-pay | Admitting: Interventional Radiology

## 2010-11-23 DIAGNOSIS — K831 Obstruction of bile duct: Secondary | ICD-10-CM

## 2010-11-24 ENCOUNTER — Other Ambulatory Visit (HOSPITAL_COMMUNITY): Payer: Self-pay | Admitting: Diagnostic Radiology

## 2010-11-24 DIAGNOSIS — K831 Obstruction of bile duct: Secondary | ICD-10-CM

## 2010-11-24 NOTE — Progress Notes (Signed)
Summary: Hepatology referral  Phone Note Call from Patient Call back at Home Phone [phone]   Caller: Patient Summary of Call: Pt called back stating that he is willing to [facility] for Hepatology appt per previous phone note. Pt also states that he has multiple labs drwan  bi-weekly per [facility] but is requesting VAL order for labs to be done in [facility]. Pt was advised to have coordinator fax office with labs needed for VALs review/authorization. Initial call taken by: Margaret Pyle, CMA,  November 11, 2010 4:10 PM  Follow-up for Phone Call        noted - i spoke again w/ dr. Jacqualine Mau to update on same - he requests re-fax of consult request to [phone] and pt will be scheduled to be seen at [facility] - re: pt ?about location of labs, as stated, will need to review orders before arranging - thanks Follow-up by: Newt Lukes MD,  November 11, 2010 5:36 PM  Additional Follow-up for Phone Call Additional follow up Details #1::        forwarded to [facility] to schedule Additional Follow-up by: Margaret Pyle, CMA,  November 12, 2010 8:02 AM    Additional Follow-up for Phone Call Additional follow up Details #2::    Awaiting appt faxed referral and ov notes to [facility]  [address]. [place], [place] [address]. Phone: [phone]. Fax: [phone].  Follow-up by: Shelbie Proctor,  November 19, 2010 1:34 PM

## 2010-11-25 ENCOUNTER — Ambulatory Visit (HOSPITAL_COMMUNITY)
Admission: RE | Admit: 2010-11-25 | Discharge: 2010-11-25 | Disposition: A | Discharge status: Home / Self Care | Payer: Medicare (Managed Care) | Source: Ambulatory Visit | Attending: Diagnostic Radiology | Admitting: Diagnostic Radiology

## 2010-11-25 DIAGNOSIS — Z4682 Encounter for fitting and adjustment of non-vascular catheter: Secondary | ICD-10-CM | POA: Insufficient documentation

## 2010-11-25 DIAGNOSIS — K831 Obstruction of bile duct: Secondary | ICD-10-CM | POA: Insufficient documentation

## 2010-11-25 LAB — GLUCOSE, CAPILLARY
Glucose-Capillary: 104 mg/dL — ABNORMAL HIGH (ref 70–99)
Glucose-Capillary: 108 mg/dL — ABNORMAL HIGH (ref 70–99)
Glucose-Capillary: 118 mg/dL — ABNORMAL HIGH (ref 70–99)
Glucose-Capillary: 138 mg/dL — ABNORMAL HIGH (ref 70–99)
Glucose-Capillary: 181 mg/dL — ABNORMAL HIGH (ref 70–99)
Glucose-Capillary: 97 mg/dL (ref 70–99)
Glucose-Capillary: 98 mg/dL (ref 70–99)

## 2010-11-25 LAB — BASIC METABOLIC PANEL
BUN: 28 mg/dL — ABNORMAL HIGH (ref 6–23)
CO2: 14 mEq/L — ABNORMAL LOW (ref 19–32)
Calcium: 7.7 mg/dL — ABNORMAL LOW (ref 8.4–10.5)
Chloride: 117 mEq/L — ABNORMAL HIGH (ref 96–112)
Creatinine, Ser: 1.47 mg/dL (ref 0.4–1.5)
GFR calc Af Amer: 60 mL/min — ABNORMAL LOW (ref >60)
Glucose, Bld: 101 mg/dL — ABNORMAL HIGH (ref 70–99)

## 2010-11-25 LAB — CARDIAC PANEL(CRET KIN+CKTOT+MB+TROPI)
CK, MB: 1.6 ng/mL (ref 0.3–4.0)
Relative Index: INVALID (ref 0.0–2.5)
Total CK: 48 U/L (ref 7–232)
Troponin I: 0.01 ng/mL (ref 0.00–0.06)
Troponin I: 0.02 ng/mL (ref 0.00–0.06)

## 2010-11-25 LAB — COMPREHENSIVE METABOLIC PANEL
AST: 55 U/L — ABNORMAL HIGH (ref 0–37)
Albumin: 2.4 g/dL — ABNORMAL LOW (ref 3.5–5.2)
Alkaline Phosphatase: 151 U/L — ABNORMAL HIGH (ref 39–117)
Alkaline Phosphatase: 170 U/L — ABNORMAL HIGH (ref 39–117)
BUN: 22 mg/dL (ref 6–23)
BUN: 26 mg/dL — ABNORMAL HIGH (ref 6–23)
CO2: 16 mEq/L — ABNORMAL LOW (ref 19–32)
CO2: 19 mEq/L (ref 19–32)
Chloride: 116 mEq/L — ABNORMAL HIGH (ref 96–112)
Chloride: 120 mEq/L — ABNORMAL HIGH (ref 96–112)
GFR calc non Af Amer: 52 mL/min — ABNORMAL LOW (ref >60)
GFR calc non Af Amer: 58 mL/min — ABNORMAL LOW (ref >60)
Glucose, Bld: 98 mg/dL (ref 70–99)
Potassium: 4.2 mEq/L (ref 3.5–5.1)
Potassium: 5.6 mEq/L — ABNORMAL HIGH (ref 3.5–5.1)
Total Bilirubin: 3.9 mg/dL — ABNORMAL HIGH (ref 0.3–1.2)
Total Bilirubin: 4.3 mg/dL — ABNORMAL HIGH (ref 0.3–1.2)

## 2010-11-25 LAB — URINALYSIS, ROUTINE W REFLEX MICROSCOPIC
Ketones, ur: NEGATIVE mg/dL
Nitrite: NEGATIVE
Protein, ur: NEGATIVE mg/dL
Urobilinogen, UA: 0.2 mg/dL (ref 0.0–1.0)

## 2010-11-25 LAB — ALBUMIN, FLUID (OTHER): Albumin, Fluid: <1 g/dL

## 2010-11-25 LAB — CBC
HCT: 22.8 % — ABNORMAL LOW (ref 39.0–52.0)
HCT: 25 % — ABNORMAL LOW (ref 39.0–52.0)
HCT: 25.3 % — ABNORMAL LOW (ref 39.0–52.0)
HCT: 26 % — ABNORMAL LOW (ref 39.0–52.0)
Hemoglobin: 8.1 g/dL — ABNORMAL LOW (ref 13.0–17.0)
Hemoglobin: 8.7 g/dL — ABNORMAL LOW (ref 13.0–17.0)
MCHC: 35.3 g/dL (ref 30.0–36.0)
MCHC: 35.5 g/dL (ref 30.0–36.0)
MCV: 100.2 fL — ABNORMAL HIGH (ref 78.0–100.0)
MCV: 99.4 fL (ref 78.0–100.0)
Platelets: 29 10*3/uL — CL (ref 150–400)
Platelets: 56 10*3/uL — ABNORMAL LOW (ref 150–400)
Platelets: DECREASED 10*3/uL (ref 150–400)
RBC: 2.32 MIL/uL — ABNORMAL LOW (ref 4.22–5.81)
RBC: 2.62 MIL/uL — ABNORMAL LOW (ref 4.22–5.81)
RDW: 18.3 % — ABNORMAL HIGH (ref 11.5–15.5)
RDW: 19 % — ABNORMAL HIGH (ref 11.5–15.5)
RDW: 19.4 % — ABNORMAL HIGH (ref 11.5–15.5)
RDW: 19.4 % — ABNORMAL HIGH (ref 11.5–15.5)
WBC: 3.7 10*3/uL — ABNORMAL LOW (ref 4.0–10.5)
WBC: 7.6 10*3/uL (ref 4.0–10.5)

## 2010-11-25 LAB — CROSSMATCH: ABO/RH(D): O NEG

## 2010-11-25 LAB — LITHIUM LEVEL
Lithium Lvl: <0.25 mEq/L — ABNORMAL LOW (ref 0.80–1.40)
Lithium Lvl: <0.25 mEq/L — ABNORMAL LOW (ref 0.80–1.40)

## 2010-11-25 LAB — MISCELLANEOUS TEST: Miscellaneous Test: 81918

## 2010-11-25 LAB — PATHOLOGIST SMEAR REVIEW

## 2010-11-25 LAB — CK TOTAL AND CKMB (NOT AT ARMC): Total CK: 43 U/L (ref 7–232)

## 2010-11-25 LAB — IRON AND TIBC

## 2010-11-25 LAB — RETICULOCYTES
RBC.: 2.61 MIL/uL — ABNORMAL LOW (ref 4.22–5.81)
Retic Count, Absolute: 36.5 10*3/uL (ref 19.0–186.0)

## 2010-11-25 LAB — BODY FLUID CELL COUNT WITH DIFFERENTIAL
Eos, Fluid: 2 %
Total Nucleated Cell Count, Fluid: 240 cu mm (ref 0–1000)

## 2010-11-25 LAB — BODY FLUID CULTURE

## 2010-11-25 LAB — ACETAMINOPHEN LEVEL: Acetaminophen (Tylenol), Serum: <10 ug/mL — ABNORMAL LOW (ref 10–30)

## 2010-11-25 LAB — CULTURE, BLOOD (ROUTINE X 2): Culture: NO GROWTH

## 2010-11-25 LAB — AMMONIA: Ammonia: 323 umol/L — ABNORMAL HIGH (ref 11–35)

## 2010-11-25 LAB — URINE CULTURE
Colony Count: NO GROWTH
Culture: NO GROWTH

## 2010-11-25 LAB — LIPID PANEL
LDL Cholesterol: 37 mg/dL (ref 0–99)
VLDL: 7 mg/dL (ref 0–40)

## 2010-11-25 LAB — DIFFERENTIAL
Basophils Absolute: 0 10*3/uL (ref 0.0–0.1)
Eosinophils Absolute: 0.4 10*3/uL (ref 0.0–0.7)
Lymphocytes Relative: 17 % (ref 12–46)
Lymphs Abs: 0.6 10*3/uL — ABNORMAL LOW (ref 0.7–4.0)
Neutro Abs: 2.4 10*3/uL (ref 1.7–7.7)

## 2010-11-25 LAB — PROTIME-INR
INR: 2.18 — ABNORMAL HIGH (ref 0.00–1.49)
Prothrombin Time: 22.7 seconds — ABNORMAL HIGH (ref 11.6–15.2)
Prothrombin Time: 24.1 seconds — ABNORMAL HIGH (ref 11.6–15.2)

## 2010-11-25 LAB — HEMOCCULT GUIAC POC 1CARD (OFFICE): Fecal Occult Bld: POSITIVE

## 2010-11-25 LAB — VITAMIN B12: Vitamin B-12: 1755 pg/mL — ABNORMAL HIGH (ref 211–911)

## 2010-11-25 LAB — GRAM STAIN

## 2010-11-25 LAB — ETHANOL: Alcohol, Ethyl (B): <5 mg/dL (ref 0–10)

## 2010-11-25 MED ORDER — IOHEXOL 300 MG/ML  SOLN
50.0000 mL | Freq: Once | INTRAMUSCULAR | Status: AC | PRN
  Administered 2010-11-25: 5 mL via INTRAVENOUS

## 2010-12-04 ENCOUNTER — Ambulatory Visit: Payer: Medicare (Managed Care) | Admitting: Gastroenterology

## 2010-12-09 LAB — DIFFERENTIAL
Eosinophils Relative: 3 % (ref 0–5)
Lymphocytes Relative: 9 % — ABNORMAL LOW (ref 12–46)
Lymphs Abs: 0.3 10*3/uL — ABNORMAL LOW (ref 0.7–4.0)
Monocytes Absolute: 0.3 10*3/uL (ref 0.1–1.0)

## 2010-12-09 LAB — COMPREHENSIVE METABOLIC PANEL
AST: 43 U/L — ABNORMAL HIGH (ref 0–37)
Albumin: 2.2 g/dL — ABNORMAL LOW (ref 3.5–5.2)
Calcium: 7.8 mg/dL — ABNORMAL LOW (ref 8.4–10.5)
Chloride: 117 mEq/L — ABNORMAL HIGH (ref 96–112)
Creatinine, Ser: 1.4 mg/dL (ref 0.4–1.5)
GFR calc Af Amer: >60 mL/min (ref >60)
Total Protein: 7.1 g/dL (ref 6.0–8.3)

## 2010-12-09 LAB — APTT: aPTT: 37 seconds (ref 24–37)

## 2010-12-09 LAB — CBC
MCHC: 34 g/dL (ref 30.0–36.0)
MCV: 94.8 fL (ref 78.0–100.0)
Platelets: 48 10*3/uL — CL (ref 150–400)
RDW: 17.2 % — ABNORMAL HIGH (ref 11.5–15.5)
WBC: 3.8 10*3/uL — ABNORMAL LOW (ref 4.0–10.5)

## 2010-12-09 LAB — AMMONIA: Ammonia: 100 umol/L — ABNORMAL HIGH (ref 11–35)

## 2010-12-11 LAB — CROSSMATCH
ABO/RH(D): O NEG
ABO/RH(D): O NEG
Antibody Screen: NEGATIVE

## 2010-12-11 LAB — DIFFERENTIAL
Basophils Relative: 0 % (ref 0–1)
Eosinophils Absolute: 0.5 10*3/uL (ref 0.0–0.7)
Eosinophils Relative: 6 % — ABNORMAL HIGH (ref 0–5)
Lymphs Abs: 0.4 10*3/uL — ABNORMAL LOW (ref 0.7–4.0)
Monocytes Absolute: 0.5 10*3/uL (ref 0.1–1.0)
Neutrophils Relative %: 83 % — ABNORMAL HIGH (ref 43–77)

## 2010-12-11 LAB — CBC
HCT: 22.4 % — ABNORMAL LOW (ref 39.0–52.0)
HCT: 24 % — ABNORMAL LOW (ref 39.0–52.0)
Hemoglobin: 7.8 g/dL — CL (ref 13.0–17.0)
Hemoglobin: 8.4 g/dL — ABNORMAL LOW (ref 13.0–17.0)
Hemoglobin: 8.6 g/dL — ABNORMAL LOW (ref 13.0–17.0)
MCHC: 34.9 g/dL (ref 30.0–36.0)
MCHC: 34.9 g/dL (ref 30.0–36.0)
Platelets: UNDETERMINED 10*3/uL (ref 150–400)
RBC: 2.2 MIL/uL — ABNORMAL LOW (ref 4.22–5.81)
RBC: 2.34 MIL/uL — ABNORMAL LOW (ref 4.22–5.81)
RBC: 2.54 MIL/uL — ABNORMAL LOW (ref 4.22–5.81)
RBC: 2.61 MIL/uL — ABNORMAL LOW (ref 4.22–5.81)
RDW: 18.1 % — ABNORMAL HIGH (ref 11.5–15.5)
RDW: 18.4 % — ABNORMAL HIGH (ref 11.5–15.5)
WBC: 7.6 10*3/uL (ref 4.0–10.5)
WBC: 7.8 10*3/uL (ref 4.0–10.5)

## 2010-12-11 LAB — URINALYSIS, ROUTINE W REFLEX MICROSCOPIC
Bilirubin Urine: NEGATIVE
Glucose, UA: NEGATIVE mg/dL
Specific Gravity, Urine: 1.014 (ref 1.005–1.030)
Urobilinogen, UA: 0.2 mg/dL (ref 0.0–1.0)
pH: 8.5 — ABNORMAL HIGH (ref 5.0–8.0)

## 2010-12-11 LAB — RAPID URINE DRUG SCREEN, HOSP PERFORMED
Amphetamines: NOT DETECTED
Barbiturates: NOT DETECTED
Benzodiazepines: NOT DETECTED
Tetrahydrocannabinol: NOT DETECTED

## 2010-12-11 LAB — BASIC METABOLIC PANEL
CO2: 17 mEq/L — ABNORMAL LOW (ref 19–32)
Calcium: 7.3 mg/dL — ABNORMAL LOW (ref 8.4–10.5)
Calcium: 7.5 mg/dL — ABNORMAL LOW (ref 8.4–10.5)
GFR calc Af Amer: >60 mL/min (ref >60)
GFR calc Af Amer: >60 mL/min (ref >60)
GFR calc non Af Amer: 53 mL/min — ABNORMAL LOW (ref >60)
Glucose, Bld: 105 mg/dL — ABNORMAL HIGH (ref 70–99)
Potassium: 4.5 mEq/L (ref 3.5–5.1)
Potassium: 4.7 mEq/L (ref 3.5–5.1)
Sodium: 135 mEq/L (ref 135–145)
Sodium: 135 mEq/L (ref 135–145)

## 2010-12-11 LAB — COMPREHENSIVE METABOLIC PANEL
ALT: 20 U/L (ref 0–53)
ALT: 22 U/L (ref 0–53)
AST: 45 U/L — ABNORMAL HIGH (ref 0–37)
AST: 45 U/L — ABNORMAL HIGH (ref 0–37)
Albumin: 1.7 g/dL — ABNORMAL LOW (ref 3.5–5.2)
Alkaline Phosphatase: 183 U/L — ABNORMAL HIGH (ref 39–117)
CO2: 18 mEq/L — ABNORMAL LOW (ref 19–32)
CO2: 19 mEq/L (ref 19–32)
Calcium: 7.5 mg/dL — ABNORMAL LOW (ref 8.4–10.5)
GFR calc Af Amer: >60 mL/min (ref >60)
GFR calc Af Amer: >60 mL/min (ref >60)
GFR calc non Af Amer: 57 mL/min — ABNORMAL LOW (ref >60)
Glucose, Bld: 121 mg/dL — ABNORMAL HIGH (ref 70–99)
Potassium: 4.7 mEq/L (ref 3.5–5.1)
Sodium: 134 mEq/L — ABNORMAL LOW (ref 135–145)
Sodium: 137 mEq/L (ref 135–145)
Total Protein: 7 g/dL (ref 6.0–8.3)

## 2010-12-11 LAB — CULTURE, BLOOD (ROUTINE X 2): Culture: NO GROWTH

## 2010-12-11 LAB — AMMONIA: Ammonia: 164 umol/L — ABNORMAL HIGH (ref 11–35)

## 2010-12-11 LAB — SAMPLE TO BLOOD BANK

## 2010-12-11 LAB — URINE CULTURE: Colony Count: NO GROWTH

## 2010-12-11 LAB — URINE MICROSCOPIC-ADD ON

## 2010-12-11 LAB — HIV ANTIBODY (ROUTINE TESTING W REFLEX): HIV: NONREACTIVE

## 2010-12-11 LAB — MAGNESIUM: Magnesium: 2 mg/dL (ref 1.5–2.5)

## 2010-12-11 LAB — PROTIME-INR: INR: 1.8 — ABNORMAL HIGH (ref 0.00–1.49)

## 2010-12-12 NOTE — Discharge Summary (Signed)
NAMEREMUS, HAGEDORN            ACCOUNT NO.:  [id number]  MEDICAL RECORD NO.:  [id number]           PATIENT TYPE:  I  LOCATION:  1504                         FACILITY:  [facility]  PHYSICIAN:  Richarda Overlie, MD       DATE OF BIRTH:  [birth year]  DATE OF ADMISSION:  11/05/2010 DATE OF DISCHARGE:                        DISCHARGE SUMMARY - REFERRING   PRIMARY CARE PHYSICIAN:  Dr. Dorise Hiss and Dr. Earlene Plater at [facility] in [place], [place].  DISCHARGE DIAGNOSES: 1. Hyperkalemia. 2. Altered mental status secondary to hepatic encephalopathy. 3. Medication noncompliance with lactulose. 4. 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 5. 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 6. Portal hypertension. 7. Sinusitis. 8. Grade 4 esophageal viruses. 9. Hypomagnesemia. 10.Non-anion gap acidosis. 11.Anemia. 12.Thrombocytopenia.  CONSULTATIONS:  PCCM for altered mental status.  PROCEDURES:  Chest x-ray shows improved left effusion and left hazy diffuse airspace disease.  CT scan of the head without contrast shows no acute intracranial abnormality.  Initial ammonia level of 235.  Culture from the gallbladder drainage showed gram-negative rods.  Blood cultures have been negative x4.  SUBJECTIVE:  This is a 60 year old male with a history of alcoholic cirrhosis, end-stage liver disease, status post failed liver transplant, history of biliary tree placement secondary to biliary obstructions in 2005, portal hypertension, grade 4 esophageal varices, who was brought to the ED for altered mental status by his girlfriend.  The patient was also found to be combative and was found to have some skin tears to bilateral arms and was found to be confused.  HOSPITAL COURSE: 1. Hepatic encephalopathy.  The patient has returned back to his  normal cognitive status after being treated with lactulose.  He was     continued on neomycin as well as rifaximin.  The patient has been     counseled about being compliant with his lactulose.  The patient     apparently has an appointment setup with his liver transplant     physician tomorrow and is requesting to be discharged home today     with his friend who is going to drive him to [facility] tomorrow     morning.  The patient's admitting ammonia level was 235.  Prior to     discharge, his ammonia level was down to 80.  The patient's     mentation was appropriate.  He was alert and oriented x3 prior to     discharge. 2. History of end-stage hepatic disease, status post liver transplant.     The patient has a biliary drain in place.  Upon admission, the     patient was also found to be hypothermic and there was a concern     about infection.  He was started on broad-spectrum antibiotics.     The patient is on SBP prophylaxis with Bactrim at home.  He is also     on Prograf; therefore because of his immunocompromised status, he     was initiated on broad-spectrum antibiotics.  Blood cultures  were     drawn and these were found to be negative.  Culture from his     biliary drain grew up gram-negative rods.  Therefore, the patient     will be continued on Augmentin 875 mg twice a day for the next 10     days. 3. Hyperkalemia.  The patient was found to have an elevated potassium     of 6.4.  This was treated with Kayexalate.  Prior to discharge, the     patient's potassium was 4.7.  The patient has resumed his     Kayexalate and we will continue this in addition to his lactulose.     Initially, the patient's Aldactone was held, but this was     reinitiated during his hospital stay and the dose of his Aldactone     is currently at 50 mg p.o. twice a day.  The patient's Lasix was     also resumed and currently, the patient's Lasix dose is 40 mg p.o.     daily. 4. Acute renal  insufficiency.  The patient was found to have an     elevated creatinine upon admission.  His creatinine at baseline is     around 1.2.  His creatinine upon admission and during this hospital     was increased up to 1.69.  This was a combination of Bactrim versus     diuretic versus infection versus acute renal insufficiency in the     setting of decreased p.o. intake and dehydration.  However, the     patient's Lasix was held initially and he was hydrated gently with     IV fluids.  His creatinine did improve to 1.33 prior to discharge     despite resumption of his Aldactone and his Lasix.  Of note is that     the patient's Aldactone has been changed to 50 mg p.o. twice a day     and Lasix has been decreased to 40 mg p.o. daily.  DISPOSITION:  The patient has requested to be discharged home, so that his girlfriend can drive him up to [facility] tomorrow.  He has tolerated p.o. diet.  He is alert and oriented x3.  His discharge medications would be as follows: 1. Augmentin 875 mg 1 tablet p.o. twice daily. 2. Protonix 40 mg p.o. twice daily. 3. Ferrous sulfate 325 mg p.o. twice daily. 4. Lasix 40 mg p.o. daily. 5. Aldactone 50 mg p.o. twice daily. 6. Bactrim 400/80 mg 1 tablet p.o. Monday, Wednesday, and Friday. 7. Clobetasol topical 0.05%, 1 application topical twice daily. 8. Kayexalate 4 teaspoons p.o. every other day. 9. Lactulose 30 mL p.o. 4 times a day. 10.Magnesium oxide 1200 mg p.o. 3 times a day. 11.Multivitamins 1 tablet p.o. daily. 12.Nadolol 40 mg p.o. at bedtime. 13.Neomycin 500 mg 1 tablet p.o. 3 times a day. 14.Prograf 0.5 mg p.o. in the morning. 15.Sodium bicarbonate 650 mg tablets, 3 tablets p.o. 3 times a day. 16.Ursodiol 300 mg 1 capsule p.o. 3 times a day. 17.Vitamin C 500 mg p.o. daily. 18.Rifaximin 550 mg p.o. twice daily. 19.Zinc 220 mg p.o. twice daily.     Richarda Overlie, MD     NA/MEDQ  D:  11/08/2010  T:  11/08/2010  Job:  [id number]  cc:   Dr.  Earlene Plater.  Dr. Dorise Hiss.  Electronically Signed by Richarda Overlie MD on 12/12/2010 04:54:09 PM

## 2010-12-13 LAB — CBC
HCT: 23 % — ABNORMAL LOW (ref 39.0–52.0)
HCT: 25.4 % — ABNORMAL LOW (ref 39.0–52.0)
HCT: 28.5 % — ABNORMAL LOW (ref 39.0–52.0)
Hemoglobin: 8.8 g/dL — ABNORMAL LOW (ref 13.0–17.0)
Hemoglobin: 8.8 g/dL — ABNORMAL LOW (ref 13.0–17.0)
MCHC: 33.6 g/dL (ref 30.0–36.0)
MCHC: 33.2 g/dL (ref 30.0–36.0)
MCV: 95.5 fL (ref 78.0–100.0)
MCV: 95.5 fL (ref 78.0–100.0)
Platelets: 60 10*3/uL — ABNORMAL LOW (ref 150–400)
RBC: 2.57 MIL/uL — ABNORMAL LOW (ref 4.22–5.81)
RBC: 2.66 MIL/uL — ABNORMAL LOW (ref 4.22–5.81)
RBC: 2.68 MIL/uL — ABNORMAL LOW (ref 4.22–5.81)
RDW: 16.8 % — ABNORMAL HIGH (ref 11.5–15.5)
RDW: 16.8 % — ABNORMAL HIGH (ref 11.5–15.5)
WBC: 5.2 10*3/uL (ref 4.0–10.5)
WBC: 6 10*3/uL (ref 4.0–10.5)
WBC: 6.8 10*3/uL (ref 4.0–10.5)
WBC: 7.2 10*3/uL (ref 4.0–10.5)

## 2010-12-13 LAB — COMPREHENSIVE METABOLIC PANEL
ALT: 19 U/L (ref 0–53)
AST: 35 U/L (ref 0–37)
AST: 49 U/L — ABNORMAL HIGH (ref 0–37)
Albumin: 1.8 g/dL — ABNORMAL LOW (ref 3.5–5.2)
Albumin: 2.5 g/dL — ABNORMAL LOW (ref 3.5–5.2)
Alkaline Phosphatase: 204 U/L — ABNORMAL HIGH (ref 39–117)
BUN: 21 mg/dL (ref 6–23)
BUN: 18 mg/dL (ref 6–23)
CO2: 15 mEq/L — ABNORMAL LOW (ref 19–32)
Calcium: 7.7 mg/dL — ABNORMAL LOW (ref 8.4–10.5)
Calcium: 7.8 mg/dL — ABNORMAL LOW (ref 8.4–10.5)
Chloride: 116 mEq/L — ABNORMAL HIGH (ref 96–112)
Creatinine, Ser: 1.39 mg/dL (ref 0.4–1.5)
Creatinine, Ser: 1.4 mg/dL (ref 0.4–1.5)
GFR calc Af Amer: >60 mL/min (ref >60)
GFR calc Af Amer: >60 mL/min (ref >60)
GFR calc Af Amer: >60 mL/min (ref >60)
GFR calc non Af Amer: 53 mL/min — ABNORMAL LOW (ref >60)
GFR calc non Af Amer: 53 mL/min — ABNORMAL LOW (ref >60)
Glucose, Bld: 90 mg/dL (ref 70–99)
Potassium: 5.7 mEq/L — ABNORMAL HIGH (ref 3.5–5.1)
Sodium: 140 mEq/L (ref 135–145)
Total Bilirubin: 2.3 mg/dL — ABNORMAL HIGH (ref 0.3–1.2)
Total Bilirubin: 1.9 mg/dL — ABNORMAL HIGH (ref 0.3–1.2)
Total Protein: 7.3 g/dL (ref 6.0–8.3)

## 2010-12-13 LAB — BASIC METABOLIC PANEL
BUN: 20 mg/dL (ref 6–23)
BUN: 14 mg/dL (ref 6–23)
Calcium: 7.1 mg/dL — ABNORMAL LOW (ref 8.4–10.5)
Calcium: 8.4 mg/dL (ref 8.4–10.5)
Creatinine, Ser: 1.42 mg/dL (ref 0.4–1.5)
Creatinine, Ser: 1.45 mg/dL (ref 0.4–1.5)
GFR calc Af Amer: >60 mL/min (ref >60)
GFR calc Af Amer: >60 mL/min (ref >60)
GFR calc Af Amer: >60 mL/min (ref >60)
GFR calc non Af Amer: 51 mL/min — ABNORMAL LOW (ref >60)
GFR calc non Af Amer: 53 mL/min — ABNORMAL LOW (ref >60)
GFR calc non Af Amer: >60 mL/min (ref >60)
Potassium: 4.8 mEq/L (ref 3.5–5.1)
Potassium: 4.9 mEq/L (ref 3.5–5.1)
Potassium: 5 mEq/L (ref 3.5–5.1)
Sodium: 138 mEq/L (ref 135–145)
Sodium: 137 mEq/L (ref 135–145)

## 2010-12-13 LAB — URINALYSIS, ROUTINE W REFLEX MICROSCOPIC
Glucose, UA: NEGATIVE mg/dL
Ketones, ur: NEGATIVE mg/dL
Leukocytes, UA: NEGATIVE
Nitrite: NEGATIVE
Protein, ur: NEGATIVE mg/dL
Urobilinogen, UA: 0.2 mg/dL (ref 0.0–1.0)

## 2010-12-13 LAB — DIFFERENTIAL
Basophils Absolute: 0.1 10*3/uL (ref 0.0–0.1)
Basophils Absolute: 0 10*3/uL (ref 0.0–0.1)
Basophils Relative: 0 % (ref 0–1)
Eosinophils Absolute: 0.2 10*3/uL (ref 0.0–0.7)
Eosinophils Relative: 0 % (ref 0–5)
Eosinophils Relative: 6 % — ABNORMAL HIGH (ref 0–5)
Lymphocytes Relative: 6 % — ABNORMAL LOW (ref 12–46)
Lymphocytes Relative: 6 % — ABNORMAL LOW (ref 12–46)
Lymphocytes Relative: 11 % — ABNORMAL LOW (ref 12–46)
Lymphs Abs: 0.6 10*3/uL — ABNORMAL LOW (ref 0.7–4.0)
Monocytes Relative: 6 % (ref 3–12)
Monocytes Relative: 6 % (ref 3–12)
Monocytes Relative: 6 % (ref 3–12)
Neutro Abs: 3.9 10*3/uL (ref 1.7–7.7)
Neutrophils Relative %: 84 % — ABNORMAL HIGH (ref 43–77)
Neutrophils Relative %: 87 % — ABNORMAL HIGH (ref 43–77)
Smear Review: DECREASED

## 2010-12-13 LAB — PROTIME-INR
INR: 1.7 — ABNORMAL HIGH (ref 0.00–1.49)
INR: 1.8 — ABNORMAL HIGH (ref 0.00–1.49)
INR: 1.7 — ABNORMAL HIGH (ref 0.00–1.49)
Prothrombin Time: 21 seconds — ABNORMAL HIGH (ref 11.6–15.2)

## 2010-12-13 LAB — AMMONIA
Ammonia: 169 umol/L — ABNORMAL HIGH (ref 11–35)
Ammonia: 81 umol/L — ABNORMAL HIGH (ref 11–35)
Ammonia: 83 umol/L — ABNORMAL HIGH (ref 11–35)
Ammonia: 204 umol/L — ABNORMAL HIGH (ref 11–35)
Ammonia: 140 umol/L — ABNORMAL HIGH (ref 11–35)

## 2010-12-13 LAB — CLOSTRIDIUM DIFFICILE EIA: C difficile Toxins A+B, EIA: NEGATIVE

## 2010-12-13 LAB — RAPID URINE DRUG SCREEN, HOSP PERFORMED
Amphetamines: NOT DETECTED
Benzodiazepines: NOT DETECTED
Cocaine: NOT DETECTED

## 2010-12-13 LAB — CROSSMATCH: ABO/RH(D): O NEG

## 2010-12-13 LAB — CULTURE, BLOOD (ROUTINE X 2)

## 2010-12-13 LAB — LIPID PANEL
HDL: 40 mg/dL (ref >39)
Total CHOL/HDL Ratio: 2.2 RATIO
VLDL: 6 mg/dL (ref 0–40)

## 2010-12-13 LAB — POCT TOXICOLOGY PANEL

## 2010-12-13 LAB — APTT: aPTT: 38 seconds — ABNORMAL HIGH (ref 24–37)

## 2010-12-13 LAB — STOOL CULTURE

## 2010-12-13 LAB — ABO/RH: ABO/RH(D): O NEG

## 2010-12-13 LAB — SODIUM, URINE, RANDOM: Sodium, Ur: 153 mEq/L

## 2010-12-13 LAB — GIARDIA/CRYPTOSPORIDIUM SCREEN(EIA): Cryptosporidium Screen (EIA): NEGATIVE

## 2010-12-13 LAB — TSH: TSH: 2.11 u[IU]/mL (ref 0.350–4.500)

## 2010-12-13 LAB — MAGNESIUM: Magnesium: 2.2 mg/dL (ref 1.5–2.5)

## 2010-12-17 NOTE — H&P (Signed)
NAMEMITCHELLE, GOERNER            ACCOUNT NO.:  [id number]  MEDICAL RECORD NO.:  [id number]           PATIENT TYPE:  E  LOCATION:  WLED                         FACILITY:  [facility]  PHYSICIAN:  Massie Maroon, MD        DATE OF BIRTH:  Oct 21, 1950  DATE OF ADMISSION:  11/05/2010 DATE OF DISCHARGE:                             HISTORY & PHYSICAL   PRIMARY CARE PHYSICIAN:  [employer].  Liver transplant followup.  CHIEF COMPLAINT:  Altered mental status.  HISTORY OF PRESENT ILLNESS:  A 60 year old male with alcoholic cirrhosis, end-stage liver disease, status post failed liver transplant, history of biliary tree replacement secondary to biliary obstruction since 2005, portal hypertension, grade 4 esophageal varices, apparently was brought in for altered mental status.  His girlfriend apparently called police to come to the house and the patient was combative  and torn up and blood was everywhere, skin tears to bilateral arms and the patient was confused.  The patient was brought to the ED and found to be hyperkalemic, anemic and has a possible hint of pneumonia on chest x- ray.  There was left hazy diffuse airspace disease.  There was also improving left pleural effusion.  PAST MEDICAL HISTORY: 1. 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 2. Liver transplant in 2004 secondary to end-stage liver disease. 3. History of biliary internal and external drain placement in 2005     secondary to biliary duct obstruction. 4. Multiple episodes of hepatic encephalopathy. 5. Iron deficiency. 6. Noncompliance. 7. Portal hypertension complicated by ascites and esophageal varices. 8. History of Pseudomonas bacteremia in 2005. 9. Remote history of alcohol abuse. 10.Thrombocytopenia.  PAST SURGICAL HISTORY: 1. Liver transplant. 2. Biliary internal-external drain placement in 2005 secondary to     biliary duct obstruction, most recently had percutaneous drain     change,  September 03, 2010.  SOCIAL HISTORY:  No history of cigarette smoking.  At the present time, he has been living with his girlfriend.  There is no history of alcohol use presently.  The patient previously used to drink heavily.  No history of drug abuse.  FAMILY HISTORY:  Mother died of stroke and father died of heart attack.  ALLERGIES:  AVELOX  MEDICATIONS:  Unknown.  REVIEW OF SYSTEMS:  Unable to obtain due to hepatic encephalopathy.  PHYSICAL EXAMINATION:  VITAL SIGNS:  Temperature 98.4, pulse 62, blood pressure 123/64, pulse ox 96% on room air. HEENT:  Icteric, pupils 1.5 mm, symmetric, direct consensual reflexes intact.  Mucous membranes moist. NECK:  No JVD, no bruit. HEART:  Regular rate and rhythm.  S1, S2. LUNGS:  Clear to auscultation bilaterally. ABDOMEN:  Soft, distended.  There is a scar from liver transplant. EXTREMITIES:  No cyanosis, clubbing or edema. SKIN:  There is evidence of a drain on the right side, unable to tell if there is palmar erythema or asterixis because the patient's arm is bandaged up.  LABORATORY DATA:  Urinalysis negative.  INR 1.86.  Urinalysis, rbc's 3 to 6, ammonia level 170, lactic acid 1.0.  Alcohol level less than 5, magnesium 2.6 high, sodium 134, potassium 6.8 (high), chloride  112, bicarb 19, BUN 26, creatinine 1.47, AST 40, ALT 20, alk phos 140, total bilirubin 2.9.  WBC 5.2, hemoglobin 9.0, platelet count is 35 (low), procalcitonin 0.12, potassium 6.4 on repeat.  CT brain, no acute intracranial pathology.  Chest x-ray, improving left effusion and left hazy diffuse airspace disease.  ASSESSMENT/PLAN: 1. Hepatic encephalopathy. 2. Hyperkalemia. 3. Anemia. 4. Ammonia. 5. 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 6. History of biliary tree, replaced secondary to biliary obstruction     since 2005. 7. Portal hypertension. 8. Grade 4 esophageal varices. 9.  Thrombocytopenia.  PLAN:  The patient will be placed in step-down.  The patient has already been treated with bicarb D5, insulin, calcium gluconate and Kayexalate. We will give extra Kayexalate since his potassium is still elevated. Because of hepatic encephalopathy, we will give lactulose.  Hopefully, his altered mental status will improve as his ammonia falls.  Because of esophageal varices, the patient will be continued on Nadolol and Prilosec.  Because of his cirrhosis, he will be continued on spironolactone and Rifaximin as well as Lasix.  For DVT prophylaxis, the patient is already anticoagulated due to his cirrhosis but we will use SCDs.     Massie Maroon, MD    JYK/MEDQ  D:  11/05/2010  T:  11/05/2010  Job:  [id number]  Electronically Signed by Pearson Grippe MD on 12/17/2010 12:54:45 AM

## 2010-12-18 ENCOUNTER — Ambulatory Visit (INDEPENDENT_AMBULATORY_CARE_PROVIDER_SITE_OTHER): Payer: Medicare (Managed Care) | Admitting: Internal Medicine

## 2010-12-18 ENCOUNTER — Encounter: Payer: Self-pay | Admitting: Internal Medicine

## 2010-12-18 VITALS — BP 118/58 | HR 66 | Temp 97.8°F | Ht 72.0 in | Wt 162.8 lb

## 2010-12-18 DIAGNOSIS — J209 Acute bronchitis, unspecified: Secondary | ICD-10-CM

## 2010-12-18 MED ORDER — AMOXICILLIN-POT CLAVULANATE 875-125 MG PO TABS: 1.0000 | ORAL_TABLET | Freq: Two times a day (BID) | ORAL | Status: DC

## 2010-12-18 NOTE — Patient Instructions (Signed)
It was good to see you today. We have reviewed your prior records including labs and tests today Augmentin antibiotics for bronchitis and cough - Your prescription(s) have been submitted to your pharmacy. Please take as directed and contact our office if you believe you are having problem(s) with the medication(s).

## 2010-12-18 NOTE — Progress Notes (Signed)
  Subjective:    Patient ID: [name], male    DOB: May 22, 1951, 60 y.o.   MRN: [id number]  HPI  Here for cough Onset 1 week ago symptoms progressively worse despite use OTC meds associated with yellow thick sputum Denies fever, SOB, CP or NS; no LE edema  Also reviewed chronic medical issues:  ESLD s/p transplant status 2004 - awaiting new transplant now due to dysfx, cont symptoms  has external bilary drain, self drains "when i need to" managed by GI/surg in [place] area - follows there every 2-39mo labs done locally q2-3weeks for [facility] @ [facility] [place] - last med changes in 09/2010 hosp locally 08/2010 for hep encephalopathy reports compliance with ongoing medical tx   Past Medical History  Diagnosis Date  . THROMBOCYTOPENIA   . HEPATIC FAILURE, END STAGE 2004    liver transplant  . Hepatic encephalopathy   . DERMATITIS   . ALCOHOL ABUSE, HX OF   . ANEMIA, IRON DEFICIENCY      Review of Systems  Constitutional: Negative for chills and fatigue.  HENT: Positive for congestion. Negative for ear pain, rhinorrhea, sneezing, neck pain and postnasal drip.   Respiratory: Negative for shortness of breath and wheezing.   Cardiovascular: Negative for chest pain.  Neurological: Negative for headaches.       Objective:   Physical Exam BP 118/58  Pulse 66  Temp(Src) 97.8 F (36.6 C) (Oral)  Ht 6' (1.829 m)  Wt 162 lb 12.8 oz (73.846 kg)  BMI 22.08 kg/m2  SpO2 95%  Physical Exam  Constitutional:  oriented to person, place, and time. appears well-developed and well-nourished. No distress.  Neck: Normal range of motion. Neck supple. No JVD present. No thyromegaly present.  Cardiovascular: Normal rate, regular rhythm and normal heart sounds.  No murmur heard. Pulmonary/Chest: Effort normal. L mid chest with rhonchi, R side breath sounds normal. No respiratory distress at rest. no wheezes.  Abdominal: Soft. Bowel sounds are normal. Patient exhibits moderate distension  without change. There is no tenderness.  Neurological: he is alert and oriented to person, place, and time. No cranial nerve deficit. Coordination normal.  Skin: Jaundiced, Skin is warm and dry.  No erythema or ulceration.  Psychiatric: he has a normal mood and affect. behavior is normal. Judgment and thought content normal.      Assessment & Plan:  Acute bronchitis - tx Augmentin - erx done OTC cough med recommended

## 2010-12-25 ENCOUNTER — Telehealth: Payer: Self-pay

## 2010-12-25 DIAGNOSIS — J209 Acute bronchitis, unspecified: Secondary | ICD-10-CM

## 2010-12-25 MED ORDER — AMOXICILLIN-POT CLAVULANATE 875-125 MG PO TABS: 1.0000 | ORAL_TABLET | Freq: Two times a day (BID) | ORAL | Status: AC

## 2010-12-25 NOTE — Telephone Encounter (Signed)
Done per emr 

## 2010-12-25 NOTE — Telephone Encounter (Signed)
Pt called stating that he is still sick, productive cough and chest congestion. Pt is requesting a refill of ABX. Pt had an OV for same 04/13 with VAL

## 2010-12-26 NOTE — Telephone Encounter (Signed)
Pt advised via VM,Rx at pharmacy

## 2010-12-28 ENCOUNTER — Other Ambulatory Visit (HOSPITAL_COMMUNITY): Payer: Self-pay | Admitting: Interventional Radiology

## 2010-12-28 DIAGNOSIS — K831 Obstruction of bile duct: Secondary | ICD-10-CM

## 2010-12-29 ENCOUNTER — Ambulatory Visit (HOSPITAL_COMMUNITY)
Admission: RE | Admit: 2010-12-29 | Discharge: 2010-12-29 | Disposition: A | Discharge status: Home / Self Care | Payer: Medicare (Managed Care) | Source: Ambulatory Visit | Attending: Interventional Radiology | Admitting: Interventional Radiology

## 2010-12-29 ENCOUNTER — Other Ambulatory Visit (HOSPITAL_COMMUNITY): Payer: Self-pay | Admitting: Interventional Radiology

## 2010-12-29 DIAGNOSIS — K831 Obstruction of bile duct: Secondary | ICD-10-CM

## 2010-12-29 DIAGNOSIS — Z944 Liver transplant status: Secondary | ICD-10-CM | POA: Insufficient documentation

## 2010-12-29 DIAGNOSIS — Y849 Medical procedure, unspecified as the cause of abnormal reaction of the patient, or of later complication, without mention of misadventure at the time of the procedure: Secondary | ICD-10-CM | POA: Insufficient documentation

## 2010-12-29 DIAGNOSIS — T85898A Other specified complication of other internal prosthetic devices, implants and grafts, initial encounter: Secondary | ICD-10-CM | POA: Insufficient documentation

## 2010-12-29 MED ORDER — IOHEXOL 300 MG/ML  SOLN
50.0000 mL | Freq: Once | INTRAMUSCULAR | Status: AC | PRN
  Administered 2010-12-29: 7 mL

## 2011-01-11 DIAGNOSIS — Z944 Liver transplant status: Secondary | ICD-10-CM | POA: Insufficient documentation

## 2011-01-19 NOTE — H&P (Signed)
NAMETREJUAN, MATHERNE            ACCOUNT NO.:  [id number]   MEDICAL RECORD NO.:  [id number]          PATIENT TYPE:  INP   LOCATION:  6708                         FACILITY:  [facility]   PHYSICIAN:  Eduard Clos, MDDATE OF BIRTH:  September 29, 1950   DATE OF ADMISSION:  03/10/2009  DATE OF DISCHARGE:                              HISTORY & PHYSICAL   PRIMARY CARE PHYSICIAN:   CHIEF COMPLAINT:  Confusion.   HISTORY OF PRESENT ILLNESS:  A 61 year old male with known history of  liver transplant, status post biliary stent placement with drainage for  biliary obstruction. Presently in the course, he has episodic confusion  over the last 2 or 3 days.  He stopped taking his lactulose 2 to 3 days  because of diarrhea which he thought was really what lactulose was meant  for but felt that he is getting more confused periodically and his  girlfriend witnessed that and he was brought to the ER. In the ER the  patient had ammonia level of around 140 which is usually the usual level  for him. When he was discharge in December 2009, his level was around  120's. The patient clinically had some encephalopathy.  The patient has  been admitted for further observation and evaluation.   The patient denies any fever or chills, abdominal pain.  The patient has  been having 2 to 3 days of diarrhea, 3 or 4 times a day but denies any  abdominal pain and denies any dysuria.  Denies any loss of  consciousness, chest pain, shortness of breath.  Denies any increase in  his abdominal girth. The patient takes spironolactone only as needed  basis but takes his Lasix regularly. His creatinine is found to be  around 1.4, which is increased more than it was in December when it was  around 1.9. He also has low platelet count, which was also low in  December around 70. The patient is being admitted for further  observation and evaluation for his possible hepatic encephalopathy.   PAST MEDICAL HISTORY:  A liver  transplant for alcoholic liver cirrhosis  in 2004 at [facility]. Had a biliary stent placement for biliary  obstruction with drainage tube.   PAST SURGICAL HISTORY:  As mentioned above.   MEDICATIONS ON ADMISSION:  The patient is on vitamin C 5 mg per day,  Lactulose 30 mg b.i.d., magnesium 500 mg p.o. daily, 40 mg p.o. daily,  Protonix 20 mg p.o. b.i.d., Prograf 1 mg daily, 1 tablet p.o. on Monday,  Wednesday, and Friday. Neomycin 500 mg p.o. 4 times daily, sodium  bicarbonate 60 mg daily, multivitamin 1 tab p.o. daily, sodium  bicarbonate 60 mg p.o. daily, Lasix 20 mg p.o. daily.   ALLERGIES:  NO KNOWN DRUG ALLERGIES.   SOCIAL HISTORY:  The patient has quit drinking alcohol but in his  history as mentioned in 2009 in December, he said he did drink beer but  he says he has stopped drinking. He is not smoking cigarettes or using  illegal drugs.   FAMILY HISTORY:  Noncontributory.   REVIEW OF SYSTEMS:  As per the HPI.  PHYSICAL EXAMINATION:  GENERAL:  The patient examined at bedside and in  no  VITAL SIGNS:  Blood pressure 160/80, pulse 80/mt temperature 98.  HEENT:  Anicteric.  No pallor.  CHEST:  Bilateral air entry present.  ABDOMEN:  Mild distended, soft, nontender.  Bowel sounds heard.  Scars  from old surgery. No guarding or rigidity.  NEUROLOGIC:  Oriented to time, place, and person.   LABORATORY DATA:  CBC:  WBC's 5.1, hemoglobin 9.5, hematocrit 28.5,  platelets 60,000. PT/INR 21.1 and 1.7.  Complete metabolic panel sodium  142, potassium 5.5, chloride 118, carbon dioxide 18, glucose 124, BUN  18, creatinine 1.4.  Total bilirubin 1.9, alkaline phosphate 78, AST  149, ALT 14, total protein 7.6, calcium 7.8, albumin 2.5, ammonia level  140.  Alcohol less than 5.   ASSESSMENT:  1. 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 History of liver transplant secondary to alcoholic liver cirrhosis.  3. Status post biliary stent placement for biliary obstruction,       presently with drainage tube.  4. Dehydration.  5. diarrhea.  6. Chronic metabolic acidosis.   PLAN:  1. Will admit the patient to telemetry as the patient has mild      hyperkalemia.  Will get an EKG to make sure there are no EKG      changes for his hyperkalemia.  2. Will re-start his lactulose, increase it to 30 cc po tid.  3. Will recheck his labs in a.m. including B-met, LFTs, ammonia and      magnesium levels.  4. At this time the patient has no symptoms or signs of spontaneous      bacterial gastritis. For his diarrhea will get stool studies and      further recommendations as condition evolves. At this time we will      be holding of his Lasix and spironolactone.  Once his creatinine      improves, we may consider re-starting those medications.      Eduard Clos, MD  Electronically Signed     ANK/MEDQ  D:  03/10/2009  T:  03/10/2009  Job:  [id number]   cc:   Dr. Elizebeth Koller
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
## 2011-01-19 NOTE — Discharge Summary (Signed)
NAMEQUIENTIN, [name]            ACCOUNT NO.:  [id number]   MEDICAL RECORD NO.:  [id number]          PATIENT TYPE:  INP   LOCATION:  2925                         FACILITY:  [facility]   PHYSICIAN:  Charlestine Massed, MDDATE OF BIRTH:  1950/10/28   DATE OF ADMISSION:  08/19/2008  DATE OF DISCHARGE:  08/22/2008                               DISCHARGE SUMMARY   HOSPITAL COURSE:  [name] is a 60 year old gentleman with end-  stage liver disease secondary to alcoholic cirrhosis, underwent a liver  transplant in 2004, and after that the patient developed biliary  obstruction and a biliary stent was placed in 2005, and since then he  has been having biliary drain and he gets changed routinely there at  [facility].  The patient was admitted with extreme confusion and altered  mental status where he presented to the [facility] Emergency Room and  was brought over here.  He was found to have a very high ammonia level  of 182 and he was having asterixis as well as diagnosis of hepatic  encephalopathy was made and was started on lactulose and IV fluids and  was observed here.  A suspicion of GI bleed was made.  GI was called for  liver disease as well as for possible GI bleed.  Gastro evaluation was  done.  The patient improved gradually while on the lactulose.  His  ammonia level came down and today, the ammonia level is 121.  The  patient improved very well.  His mental status resolved totally.  He is  back to baseline.  He does not have any asterixis.  His ammonia is 121  even though it is high.  His mental status is back to baseline now.  He  is not having any more emesis.  GI, EGD revealed severe grade IV  esophageal varices, which were not bleeding, but Gastroenterology opened  that his gastropathy with bleeding is possibly the cause for the sudden  deterioration into hepatic encephalopathy as he had a high BUN also, so  the patient is being continued on proton pump inhibitors and he  was  started on beta-blocker of nadolol and was started on aldactone for  portal hypertension.  Paracentesis did not reveal any findings.  No  organism.  No evidence of subacute bacterial peritonitis.  His  hemoglobin has been stable over the past 2 days at 9.1.  The patient has  been discharged and the patient agrees to take the medications  regularly, and he also agreed to follow up at [facility].  While during  the history taking yesterday and the day before, the patient gave  information that he has been taking beer once in a while, not on a  regular basis like 1 can a week, he has been taking beer for sometime  and alcoholism.  Social worker was called and he stated that he does not  need any help to stop alcohol as he can stop his coal charcoal right  away and so there was no intervention done, but the patient did state  that he is drinking alcohol once in a while that is  only beer, so the  patient is being discharged today to go home.  Continue medications and  followup with Hepatic Transplant at [facility].   The lab results from today are ammonia level is 121 and his blood test  from yesterday showed a BMP of sodium 132, potassium 3.6, chloride 109,  bicarb 15, BUN 15, creatinine 1.12, total bilirubin 2.6, alkaline  phosphatase 164, AST 45, ALT 22, total protein 6.8, albumin 1.9, calcium  7.9, which is because of the low albumin level.  CBC today reveals WBC  of 6.4, hemoglobin 11.1, hematocrit 26.8, and platelet count 96.  EGD  done yesterday revealed grade IV esophageal varices, which were not  actively bleeding.  Ascitic study showed LDA total protein in the fluid  was less than 3, LDH was 26, color was yellow, appearance hazy.  WBC  count 350, and lymphocytes 64, macrophage is 31, and eosinophils 2.   DISCHARGE MEDICATIONS:  1. Vitamin C 500 mg daily.  2. Wellbutrin 200 mg daily.  3. Ferrous sulfate 325 mg daily.  4. Lactulose 30 mL b.i.d.  5. Magnesium oxide 400 mg  daily.  6. Nadolol 40 mg daily.  7. Protonix 40 mg b.i.d.  8. Spironolactone 100 mg daily.  9. Prograf 1 mg p.o. b.i.d.  10.Bactrim 1 tablet p.o. on Monday, Wednesday, and Friday.  11.Actigall 300 mg p.o. t.i.d.   DISPOSITION:  The patient is discharged back home.  We will follow up  with Liver Transplant Unit at [facility].      Charlestine Massed, MD  Electronically Signed     UT/MEDQ  D:  08/22/2008  T:  08/23/2008  Job:  [id number]

## 2011-01-19 NOTE — H&P (Signed)
NAMEALDON, [name]            ACCOUNT NO.:  [id number]   MEDICAL RECORD NO.:  [id number]          PATIENT TYPE:  INP   LOCATION:  2925                         FACILITY:  [facility]   PHYSICIAN:  Darryl D. Prime, MD    DATE OF BIRTH:  17-Feb-1951   DATE OF ADMISSION:  08/19/2008  DATE OF DISCHARGE:                              HISTORY & PHYSICAL   CODE STATUS:  Full code.   CHIEF COMPLAINT:  The patient is transferred from [facility] emergency  room due to concern by his family for confusion. He has a history of end  stage liver disease, status post liver transplant, and the concern is  primarily for hepatic encephalopathy. The plan is to have him be  admitted overnight under our care with transfer to [facility] on August 20, 2008.   HISTORY OF PRESENT ILLNESS:  [name] is a 60 year old male with a  history of end state liver disease status post liver transplant in  August 2004 and staging of the disease is secondary to alcohol induced  cirrhosis. He is status post biliary stent for biliary obstruction in  2005. He has since had biliary drains with a recent change out of his  drain in September 2009. Change out prior to that was in August 2009.  The patient is still followed at [facility] where he had his liver  transplant. He has confusion. The patient apparently has a girlfriend  and a sister and they were apparently at [facility] emergency room but  did not come over there. Their phone contact numbers that he has  available are not working and he cannot remember his girlfriends phone  number. The patient is here on interview alone. The patient has  significant confusion. The patient however, per ER note, apparently came  in with confusion. I am not sure how long it has been going on but it  was of gradual onset, described as increased somnolence with  intermittent agitation. He was found to have  an ammonia of 182 and was  given Lactulose  by NG tube. The patient was also given Flagyl. IV fluids  were also given and he was transferred here.   PAST MEDICAL HISTORY:  As above.  1. The patient has a history of a partial intestinal obstruction in      the past, improved with laxatives.  2. 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  3. History of end stage iron deficiency anemia.  4. History of magnesium deficiency.   PAST SURGICAL HISTORY:  As above.  1. History of surgery for an arm and ankle fracture.  2. History of hernia repairs.   ALLERGIES:  NO KNOWN DRUG ALLERGIES.   MEDICATIONS:  Unsure of what he is taking, as he is significantly  confused. He is probably on:  1. Bactrim DS for prophylaxis.  2. Sodium bicarb and high dose magnesium.  3. Probably iron.  4. Probably Omeprazole.  5. Probably Lasix.  6. He is also probably taking Prograf.   SOCIAL HISTORY:  As above. No recent alcohol use.  FAMILY HISTORY:  Per ER notes. Cannot be obtained secondary to altered  mental status.   REVIEW OF SYSTEMS:  Cannot be obtained secondary to altered mental  status.   PHYSICAL EXAMINATION:  GENERAL:  Alert and oriented times one only. Very  somnolent. Arousable somewhat for a new seconds. He does appear  chronically ill appearing.  HEENT:  Normocephalic and atraumatic. Pupils are equal, round, and  reactive to light. Extraocular muscles intact. Sclerae is slightly  icteric. Oropharynx is dry. He does have blood in his nares where the NG  tube is in place.  NECK:  Supple with no lymphadenopathy or thyromegaly. No jugular venous  distention.  LUNGS:  Decreased breath sounds bilaterally with poor effort.  ABDOMEN:  Protuberant. Cannot rule out ascites. No tenderness. The  patient has a right lower quadrant biliary drain that is draining dark  green biliary fluid with no signs of infection.  EXTREMITIES:  No clubbing, cyanosis, or edema.  NEUROLOGIC:  Examination is positive for asterixis.  VITAL SIGNS:   Temperature 97.5 with pulse of 84, respiratory rate 14,  saturations 100% on 2 liters. Blood pressure 164/76.   LABORATORY DATA:  Ammonia 182 with INR of 1.5. PT of 18.9, PTT of 39.  Sodium 138 with potassium of 4.9, chloride 113, bicarb 19, BUN 19,  creatinine 1.0, glucose 100. Total bilirubin was 1.7, alkaline  phosphatase 264, otherwise negative. AFP is 66, albumin 2.6, calcium  8.3. Urinalysis showed white blood cells 3 to 6 with red blood cells 21  to 50. Bacteria few, otherwise negative. His white count is 4.6 with  hemoglobin and hematocrit of 8.7 and 26.1. Platelets are clumped. His  hemoglobin in March of 2008 was 9.5. July 08, 2008 hemoglobin was 8.3  at an outside facility. Cardiac markers were negative at noon. Urine  drug screen is negative. Alcohol less than 10. Magnesium is pending.   ASSESSMENT/PLAN:  This is a patient who now has hepatic encephalopathy.  He will be admitted with the plan as above. Initially we will hold IV  fluids and we did assess his NG tube placement. No NG tube was  visualized in the stomach, so this was removed. He is waking up a bit  and we will try to give him Lactulose p.o. If not, will place an OG  tube. We will rule out a precipitants for hepatic encephalopathy with  blood cultures x2. Hemoccult stools, urine culture, and will give him  Flagyl. His above Lactulose will be continued. For his liver transplant  history status, will place him on Prograf 1 mg p.o. b.i.d., as he has  been on this in the past. Bactrim DS on Monday, Wednesday, and Friday.  He will also be given Magnesium and bicarb. For his cirrhosis history  and hepatic encephalopathy with some bleeding in the nares, will place  him on high dose proton pump inhibitors IV. The plan again is to have  him transferred to the [place] [facility] for definitive  management. He has no signs of acute biliary obstructions, as his  biliary is at its baseline. We will follow his  neurological status  closely. We will check his magnesium.      Darryl D. Prime, MD  Electronically Signed     DDP/MEDQ  D:  08/20/2008  T:  08/20/2008  Job:  [id number]

## 2011-01-19 NOTE — Discharge Summary (Signed)
[name]            ACCOUNT NO.:  [id number]   MEDICAL RECORD NO.:  [id number]          PATIENT TYPE:  INP   LOCATION:  6708                         FACILITY:  [facility]   PHYSICIAN:  Beckey Rutter, MD  DATE OF BIRTH:  1951/08/16   DATE OF ADMISSION:  03/10/2009  DATE OF DISCHARGE:  03/12/2009                               DISCHARGE SUMMARY   PRIMARY CARE PHYSICIAN:  The patient follows up with Liver Transplant  Program in [place], [place].  Dr. Elizebeth Koller and Dr. Myrtie Hawk.   HOSPITAL COURSE:  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  He also is status post biliary stent placement with  drainage for biliary obstruction.  The patient presented with increasing  confusion and felt it is secondary to hepatic encephalopathy.  Hence,  the patient is stop taking his lactulose for 2-3 days prior to his  presentation.   During the hospital stay, the patient was started on lactulose.  The  patient has very good improvement.  His mentation now is to his baseline  mentation.  The patient was advised to continue on lactulose at home 4  times.  He is aware of the plan.  The patient has high level of ammonia  which is normal to him.  The patient also has hyperkalemia which is  normal to him as per discussion with him.  He stated that his potassium  is always more than 5.   Since the patient is to his baseline, I will release the patient today  to follow up with the [facility] within 2 days.  He was  advised to continue on his lactulose even with his diarrhea.   The discharge plan was discussed thoroughly with him.  He is aware and  agreeable to discharge and followup plans, and in fact the discharge  plan is formulated as per his wishes.   HOSPITAL PROCEDURES:  CT head without contrast on March 10, 2009.  Impression is no acute finding.   LABORATORY DATA:  His TSH is  2.1.  His sodium is 140, potassium is 5.7  today, chloride is 116, CO2 is 17, glucose is 90, BUN is 15, creatinine  is 1.2, and magnesium is 1.9.  White blood count is 4.7, hemoglobin is  8.8, hematocrit is 25.6, and platelet count is clumps with no acute  count obtainable.  His last ammonia level is 240.  His note mentioned  that the lactulose dose was increased after that and the mentation  remained stable although the high number of ammonia.   DISCHARGE DIAGNOSES:  1. Confusion felt secondary to hepatic encephalopathy, resolved.  2. 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  3. Status post biliary stent placement with drainage percutaneously.  4. History of alcoholic abuse and alcohol dependency.  5. Dehydration, resolved.  6. Chronic metabolic acidosis.   DISCHARGE MEDICATIONS:  1. Lactulose 30 mL p.o. 4 times a day.  2. Magnesium 500 mg p.o. daily.  3. Protonix 20 mg p.o. b.i.d.  4. Prograf 1 mg p.o. daily.  5. Sodium bicarbonate 60 mg daily.  6. Multivitamins 1 tablet daily.  7. Lasix 20 mg p.o. daily.  8. Nadolol 40 mg daily.  9. Bactrim 1 tablet p.o. on Monday, Wednesday, and Friday.  10.Neomycin 500 mg p.o. 4 times a day.   DISCHARGE PLAN:  The patient was discharged to follow up within less  than 48-hour with his primary hepatologist as he is aware and agreeable  to the discharge plan.      Beckey Rutter, MD  Electronically Signed     EME/MEDQ  D:  03/12/2009  T:  03/13/2009  Job:  [id number]

## 2011-01-19 NOTE — Discharge Summary (Signed)
[name], [name]            ACCOUNT NO.:  [id number]   MEDICAL RECORD NO.:  [id number]          PATIENT TYPE:  INP   LOCATION:                               FACILITY:  [facility]   PHYSICIAN:  Zannie Cove, MD     DATE OF BIRTH:  [birth year]   DATE OF ADMISSION:  03/25/2009  DATE OF DISCHARGE:  03/27/2009                               DISCHARGE SUMMARY   PRIMARY CARE PHYSICIAN:  Dr. Rodney Booze of [facility].   HEPATOLOGIST:  Dr. Myrtie Hawk of the Liver Transplant Program in [place],  [place].   DISCHARGE DIAGNOSIS:  Hepatic encephalopathy, resolved.   SECONDARY DIAGNOSES:  1. End-stage liver disease secondary to alcohol.  2. Status post liver transplant in 2004.  3. 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  4. Medication noncompliance.  5. Iron-deficiency anemia.  6. Portal hypertension.   DISCHARGE MEDICATIONS:  1. Ursodiol 300 mg 3 times a day.  2. Bactrim DS 1 tab once a day.  3. Lasix 20 mg once a day.  4. Aldactone 100 mg once a day.  5. Sodium bicarb 60 mg every 6 hours as needed.  6. Omeprazole 20 mg once a day.  7. Prograf 1 mg as directed.  8. Neomycin 500 mg 4 times a day.  9. Vitamin B 500 mg once a day.  10.Multivitamin 1 tab once a day.  11.Magnesium oxide 500 mg once a day.  12.Lactulose 30 mL p.o. q.i.d.   HOSPITAL COURSE:  1. Mr. [name] was admitted on 03/25/2009 with a diagnosis of      altered mental status secondary to hepatic encephalopathy which is      found to be due to medication noncompliance since he has run out of      his lactulose about 4 days prior to presentation.  His mental      status significantly improved with a couple of doses of the      lactulose.  2. In addition he was also found to have a normocytic anemia and hence      GI was consulted for the need for a possible endoscopy, but since      he was not having any active bleeding and his counts remained      stable, he was discharged to  follow up with his primary      hepatologist and GI doctors in [place] and to report back if      there are any active signs or symptoms of bleeding.  3. 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  He was      continued on his Lasix, aldactone, nadolol, and Prograf.   DISCHARGE CONDITION:  Stable.   DISCHARGE DIET:  Low-sodium diet.   DISCHARGE ACTIVITY:  As tolerated.   FOLLOWUP:  The patient is advised to follow up with Dr. Myrtie Hawk and Dr.  Rodney Booze and the physicians at the Liver Transplant Program in [place],  [place].      Zannie Cove, MD  Electronically Signed     PJ/MEDQ  D:  03/27/2009  T:  03/27/2009  Job:  [id number]   cc:   Dr. Evie Lacks   Divers, M.D.  [place]

## 2011-01-22 NOTE — Discharge Summary (Signed)
[name], [name]            ACCOUNT NO.:  [id number]   MEDICAL RECORD NO.:  [id number]          PATIENT TYPE:  INP   LOCATION:  3032                         FACILITY:  [facility]   PHYSICIAN:  Ileana Roup, M.D.  DATE OF BIRTH:  Aug 29, 1951   DATE OF ADMISSION:  11/14/2006  DATE OF DISCHARGE:  11/16/2006                               DISCHARGE SUMMARY   DISCHARGE DIAGNOSES:  1. Nausea, vomiting and diarrhea x1 day, resolved at discharge.  2. Evidence of colitis by CT scan of abdomen/pelvis.  3. Probably occluded internal/external biliary drain, exchanged by      interventional radiology under fluoroscopy during this      hospitalization on 11/15/2006.  4. Status post liver transplant in August 2004, secondary to alcoholic-      induced cirrhosis, status post biliary stent placement in May 2005,      for increased bilirubin levels.  5. Status post ankle fracture.   DISCHARGE MEDICATIONS:  1. Prograf 1 mg p.o. b.i.d.  2. Actigall 200 mg t.i.d.  3. Augmentin 875 mg p.o. b.i.d.  4. Bactrim DS one tablet p.o. Monday, Wednesday and Friday.  5. Sodium bicarbonate 950 mg p.o. t.i.d.  6. Ferrous sulfate 325 mg p.o. b.i.d.  7. Atarax 25 mg p.o. p.r.n. pruritus.  8. Magnesium oxide 1200 mg p.o. t.i.d.  9. Prilosec 20 mg p.o. daily.  10.Vitamin C daily.   DISPOSITION:  The patient is to follow up with the [facility], Dr. Herbert Moors, Transplant Center, where  he is regularly seen for his liver transplant.  Telephone number there  is [phone].  The patient reports he is scheduled to be seen on  November 18, 2006.  At the time of followup, please check to ensure that  the newly placed biliary stent drain by our interventional radiologist  is sufficient to meet your needs.  Please also check on the patient's  blood count levels as he did have low platelets towards the end of his  admission.  Please also check that the patient's nausea, vomiting and  diarrhea has resolved.   PROCEDURES:  1. On November 14, 2006, chest x-ray showed cardiomegaly, no congestive      heart failure or active disease.  2. On November 14, 2006, 2-D echocardiogram showed overall left      ventricular systolic function was normal.  Left ventricular      ejection fraction was estimated to be 60%.  There was no left      ventricular regional wall motion abnormalities.  There was a mild      asymmetric, septal hypertrophy.  Left ventricular diastolic      function parameters were normal.  There is a calcified density      above the aortic valve cusp near the sinus of Valsalva that most      likely represents calcified annulus, but cannot rule out vegetation      based on this study.  Aortic valve thickness is mildly increased.      Left atrium was mildly dilated.  3. On November 14, 2006, CT of the pelvis  with findings consistent with      colitis and pelvis ascites.  4. CT of the abdomen on November 14, 2006, showed findings consistent      with portal hypertension with varices and ascites.  There is a      percutaneous internal biliary stent.  There is moderate dilatation      of the bile ducts despite the stent suggesting that the catheter      may be occluded.  Probable colitis.  Interior ventral hernia      containing pockets of ascites and collateral venous structures.  5. On November 15, 2006, exchange of biliary drainage catheter with      impression of technically successful change of internal/external      biliary drain catheter under fluoroscopy completed by Dr. Deanne Coffer      in interventional radiology.   CONSULTATIONS:  1. D. Oley Balm, M.D., interventional radiology.  2. Rachael Fee, M.D., gastroenterology.  3. Lacretia Leigh. Ninetta Lights, M.D., infectious disease.   HISTORY OF PRESENT ILLNESS:  This is a 60 year old man who presents to  the [facility] Department complaining of nausea, vomiting and  diarrhea of 1 day's duration.  At approximately  1 p.m., on the day prior  to arrival, the patient first noticed a bilateral lower quadrant  abdominal pain with radiation to bilateral flank areas that was  described as a bloated feeling.  The pain remained constant until the  patient received pain medications in the emergency department (Dilaudid  2 mg IV push).  Shortly after the onset of abdominal pain, the patient began to have  diarrhea and then nausea and vomiting shortly thereafter.  The diarrhea  is yellowish and watery and without blood.  The patient does not  regularly have diarrhea.  He had approximately 10 bowel movements of  diarrhea since the onset of symptoms.   The patient also has several episodes of emesis.  There was no  hematemesis and no changes in his recent diet.  There is no fevers,  chills, chest pain or shortness of breath with no sick contacts and no  recent alcohol intake.  The patient states that he is in line to get  another liver secondary to small bile ducts.  The patient also says  his biliary drain does clog frequently, but when this happens, he  notices severe pruritus and he does not feel like that currently.   PHYSICAL EXAMINATION:  VITAL SIGNS:  Temperature 98.8, blood pressure  121/75, heart rate 98, respirations 20, O2 saturations 97% on room air.  GENERAL:  In no acute distress.  The patient is calm and comfortable  appearing with question of possible early jaundice.  Anterior oropharynx  is clear without thyromegaly.  NECK:  Supple with full range of motion.  CARDIOVASCULAR:  Regular rate and rhythm with a grade 3/6 systolic  ejection murmur heard best over the right second intracostal space with  radiation to the carotids.  LUNGS:  Clear to auscultation bilaterally with good breath sounds.  No  rubs, rhonchi or wheezes.  ABDOMEN:  Soft, nontender, slightly distended.  Hyperactive bowel sounds  in all four quadrants with ventral hernia palpable as well as noticeable surgical scars.   EXTREMITIES:  There is no edema.  NEUROLOGIC:  Cranial nerves 2-12 intact.  Motor is 5/5 in all four  extremities.  Sensation is intact in bilateral upper and lower  extremities to light touch.  Gait is intact.  Psychiatric is affect  appropriate.  HOSPITAL COURSE:  Problem 1.  NAUSEA, VOMITING AND DIARRHEA:  The  patient presented with a 1-day duration of these symptoms and it should  be noticed that he is on immunosuppressants due to his liver transplant.  Initially, we consider differential diagnoses including viral  gastroenteritis versus Clostridium difficile colitis and we also wanted  to consider other bacterial and parasitic causes as well, as well as a  possible side-effect of Prograf, and the possibility of CMV colitis.  We  admitted the patient to give him intravenous fluids for volume  replacement and started him on Zosyn and Flagyl presumptively; we  checked a stool for Clostridium difficile toxin which turned out to be  negative.  We also checked fecal leukocytes, ova and parasites, all of  which were normal.  However, the patient did have one lab come back  positive on the day of discharge which was a positive lactoferrin.  It  should be noted that the patient's Tacrolimus level was 7.3 when we  checked it initially, within therapeutic limits.   A gastroenterology consult was also obtained with Dr. Christella Hartigan from  gastroenterology, who discussed patient with his transplant team in  [place].  On the day after admission, the patient's nausea and  diarrhea had resolved except for a case of one slightly loose stool.  He  had remained afebrile and seemed to be recovering very well.  The  patient did have elevated bilirubin which was initially 3.2 and went to  2.6 on the day of discharge.  His Alk phos on admission was 316 and went  to 261 on discharge as well.  Symptomatically, as I said before, he was  improved on the day of discharge and therefore, we were not completely   clear if he had improved naturally or if the addition of the antibiotics  had assisted.  In the end, we concurred with GI and infectious disease  that this was mostly likely a self-resolved, acute bout of  gastroenteritis; colonoscopy was considered, but not felt to be  necessary given the resolution of his symptoms.  We did not feel that  the patient needed any further inpatient treatment at this time and we  felt that he was suitable to be discharged with close followup in  [place] with his primary liver transplant team.   Upon discharge, we did start the patient back on the Augmentin which  appeared to be his home dose at 875 mg b.i.d. that was obtained from  faxed records.  Please clarify that the patient is on this chronically.   Problem 2.  STATUS POST LIVER TRANSPLANT:  When the patient arrived to  the hospital, he said that his biliary drainage catheter was due to be exchanged 1 day after admission on November 15, 2006.  Since he would not  be able to make the trip to [facility], we consulted our interventional  radiology department who decided they would be able to change out the  catheter under fluoroscopy for the patient.  Dr. Christella Hartigan discussed this  with patient's transplant team in [place], who concurred with having  the drain exchanged here.  This was done on November 15, 2006, without any  complications, by Dr. Deanne Coffer of interventional radiology.   Patient's elevated liver function tests decreased following replacement  of his internal/external biliary drain.  He was continued on Prograf and  we added Augmentin which appeared to be a chronic antibiotic for this  patient back per his home medication regimen.  We  also attempted to add  the remaining home medications to his regimen.   The patient did not appear to have any complications of his liver  transplant during this admission, although we kept a close eye on his  liver function testing as well.  For all further  decisions, I will defer  to primary team at [facility].   Problem 3.  SYSTOLIC EJECTION MURMUR:  The patient had an echocardiogram  completed during his hospital admission for full results of which, see  above in procedure section.  After comparing his echocardiogram with his  old cardiac MRI, per the cardiologist, she believed that the calcified  density above the aortic valve cusp was noninfectious in etiology.  She  said that it would only be worthwhile to get a TEE if the patient spikes  a temperature or develops positive blood cultures.   There were no complications from the patient's heart murmur from this  admission.   Problem 4.  ANEMIA:  The patient did have a low hemoglobin during this  admission from 10.1 to 9.3 to 9.5 on the day of discharge.  We felt his  anemia, which was normocytic in nature, to be an anemia of chronic  disease secondary to his liver dysfunction and status post liver  transplantation.  All of our anemia workups were negative at this time,  although there was a peripheral smear for a pathologist review that was  pending at this time.   Problem 5.  THROMBOCYTOPENIA:  We wondered if thrombocytopenia was  secondary to the patient's Prograf.  It was not critically low, although  we did keep track of it during this admission.  We did decide to hold  the patient's Protonix later in his admission due to the possible side-  effect of thrombocytopenia.   Problem 6.  INCREASED INTERNATIONAL NORMALIZED RATIO (INR):  We felt  this to be secondary to the patient's liver dysfunction.   DISCHARGE PHYSICAL EXAMINATION:  VITAL SIGNS:  Temperature 98, pulse 80,  respirations 20, blood pressure 121/74, O2 saturations 97% on room air.   DISCHARGE LABORATORY DATA AND X-RAY FINDINGS:  ABG tested 1 day prior to  discharge showed pH of 7.44, pCO2 24, pO2 86, bicarb 19.5.  White blood cell 3.8, hemoglobin 9.5, hematocrit 27,  platelets 73.  Technologist  reviewed peripheral smear showed platelets appear decreased with  polychromasia present.  Reticulocyte percent 2.8.  INR 1.6.  Sodium 133,  potassium 4.3, chloride 107, CO2 20, glucose 119, BUN 9, creatinine 1.  Total bilirubin 2.6, Alk phos 261, AST 50, ALT 17, total protein 6.7,  albumin 1.7, calcium 7.8, ammonia 43, lipase 21.  Iron 20, TIBC 179,  percent saturation 11, folate 13.2, vitamin B12 was 1623.  Ferritin 229.  Fecal occult blood was negative.  C. difficile toxin was negative.  Stool culture negative.  Ova and parasites of stool negative.      Thereasa Solo, M.D.  Electronically Signed      Ileana Roup, M.D.  Electronically Signed    AS/MEDQ  D:  11/16/2006  T:  11/17/2006  Job:  [id number]   cc:   Adriel Organ, M.D.  Herbert Moors, M.D.  D. Oley Balm III, M.D.  Rachael Fee, MD  Lacretia Leigh. Ninetta Lights, M.D.
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
## 2011-01-22 NOTE — Op Note (Signed)
[place]. [facility]  Patient:    RAYEN, DAFOE                     MRN: [id number] Proc. Date: 02/05/00 Attending:  Loraine Leriche C. Ophelia Charter, M.D.                           Operative Report  PREOPERATIVE DIAGNOSES:  Right medial subtalar dislocation with calcaneus fracture, multiple facial lacerations.  POSTOPERATIVE DIAGNOSES:  Right medial subtalar dislocation with calcaneus fracture, multiple facial lacerations.  PROCEDURE:  Irrigation and debridement of right open medial subtalar dislocation with medial and lateral exposure, reduction.  Reduction of calcaneus fracture and K-wire fixation.  Closure of left 3 cm forehead laceration, 3 cm nasal bridge laceration, and 3 cm left chin laceration.  SURGEON:  Mark C. Ophelia Charter, M.D.  ASSISTANT:  Quinn Plowman, P.A.  ANESTHESIA:  GOT.  ESTIMATED BLOOD LOSS:  Less than 100 ml.  DRAINS:  None.  DESCRIPTION OF PROCEDURE:  After induction of general anesthesia and orotracheal intubation and preoperative Ancef prophylaxis, the right thigh had a proximal thigh tourniquet applied over Webril and was thoroughly prepped. Impervious stockinette, Coban, extremity sheets, and drapes were applied. Pressure dressings were applied to the facial lacerations.  When the leg was elevated, the tourniquet was inflated.  One millimeter of skin was taken around the lateral oblique laceration over the calcaneocuboid joint.  No tendons were identified.  There was a fragment of bone and sustentaculum tali present in the subtalar joint.  Attempts at reduction under general anesthesia were unsuccessful.  The wound was irrigated out and a _____ was introduced subtalar joint in attempt to lever it to reduce it, which was unsuccessful. The navicular was medial, talus was dorsal, and could not be reduced.  The peroneals were gently retracted with the Crego and incision was made medial over the talonavicular joint.  Part of the dorsal extensor  retinaculum was caught between the talonavicular joint, which was levered out.  The anterior tibia was free.  The posterior tibia was palpated and was free, as was the neurovascular bundle.  There was a fracture between the posterior facet and anterior facet, which was oblique.  Bone fragment, which were small pieces, were picked out.  The joint was irrigated from both medial and lateral and then with exposure for both sides, the navicular was displaced distally and the talus was finally reduced.  Fluoroscopy was used to check position, and a medial pin was placed through the first cuneiform, through the navicular, and into the body of the talus, checked under fluoroscopy with excellent position. On the lateral view, there was displacement of the distal aspect of the calcaneus laterally.  This was reduced and then pinned with pin running across the lateral cortex of the cuboid, through the calcaneus fracture, and then into the body of the calcaneus, holding it reduced.  This gave good stability with flexion/extension.  Oblique views of the foot were checked, and the proximal portion of the pin sat in the subchondral bone just inferior to the posterior facet with good fixation.  Pins were bent, cut, skin was loose at the pin sites, and after repeat irrigation, skin was closed with staples. Xeroform, 4 x 4s, Webril, and a short-leg splint were applied.  Lacerations on the head were prepped, and skin was closed with fine 5-0 nylon interrupted simple suture.  All wounds were closed.  The one over the nasal  bridge was jagged with some maceration; however, it was adjacent to the medial aspect of the eye, and edges were reapproximated in simple fashion, which stopped the bleeding.  Bridge of nose was normal.  CT scan of the head and facial bones were negative.  The laceration over the chin was closed again with simple interrupted sutures and after the three lacerations were closed, Neosporin  was applied.  The tongue was inspected, as the patient had bitten his tongue.  He had some small lacerations and a small abrasion over the tongue, but no sutures were required.  The patient was extubated, transferred to the recovery room in stable condition.  Instrument count and needle count was correct. DD:  02/05/00 TD:  02/10/00 Job: [id number]

## 2011-01-22 NOTE — Discharge Summary (Signed)
[name], [name]                        ACCOUNT NO.:  [id number]   MEDICAL RECORD NO.:  [id number]                   PATIENT TYPE:  INP   LOCATION:  5526                                 FACILITY:  [facility]   PHYSICIAN:  Elliot Cousin, M.D.                 DATE OF BIRTH:  1951-07-22   DATE OF ADMISSION:  02/08/2004  DATE OF DISCHARGE:  02/13/2004                                 DISCHARGE SUMMARY   DISCHARGE DIAGNOSES:  1. Left lower lobe pneumonia with effusion.  2. Pseudomonas bacteremia.  3. Normocytic anemia.  4. 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 Eston Mould in [place], [place].  5. History of partial intestinal obstruction which resolved without surgery.  6. History of pruritus.  7. History of a broken arm.  8. History of broken ankle.  9. History of hernia as a baby.  10.      Former alcoholic.   DISCHARGE MEDICATIONS:  1. Cipro 500 mg b.i.d. until completed for an additional ten days.  2. Ferrous sulfate 325 mg b.i.d.  3. Vitamin C 500 mg daily.  4. Bactrim 400 mg t.i.d.  5. Prograf 1 mg b.i.d.  6. Ativan 1 mg p.r.n.  7. Magnesium oxide 800 mg b.i.d.  8. Prilosec 20 mg daily.  9. Sodium bicarbonate 650 mg t.i.d.   DISCHARGE DISPOSITION:  1. The patient was discharged to home in improved and stable condition.  2. He was advised to call his physician, Dr. Eston Mould, for a hospital     followup appointment in one week to ten days.   HISTORY OF PRESENT ILLNESS:  The patient is a 60 year old man with a past  medical history significant for a liver transplant, in August 2004, who  presented to the emergency department, on February 08, 2004, after he developed a  fever of 103, myalgias, and malaise.  The patient denied any abdominal pain,  cough, diarrhea, or dysuria.  He did become slightly lightheaded.  In  addition, he had no rash, no wheezing, no sore throat, no swollen glands,  and no bloody stools.  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  When the patient was evaluated in the emergency  department, it was found that his temperature was 102.8.  A chest x-ray  revealed a left lower lobe pneumonia.  Blood cultures were ordered from the  emergency department.  The patient was empirically treated with Rocephin and  azithromycin x 1 by the emergency department physician.  The patient was  subsequently admitted by the hospitalist service.   HOSPITAL COURSE:  1. LEFT LOWER LOBE PNEUMONIA WITH PSEUDOMONAS BACTEREMIA.  The initial     management began in the emergency department as stated above.     Interestingly enough, the patient had no complaints of cough; therefore,     a sputum specimen was not  obtained.  His WBC was within normal limits at     8.3.  He did, however, have a left shift with an absolute neutrophil     count of 7.2.  His platelets were mildly low at 134, and his hemoglobin     was low at 9.1.  A urinalysis was ordered and was completely within     normal limits.  The patient was started on gentle volume repletion.     Rocephin 1 gram daily and azithromycin 500 mg IV daily were ordered on     admission.  On hospital day #3, the blood cultures became positive for     Pseudomonas aeruginosa.  The Rocephin and azithromycin were, therefore,     discontinued after two full doses each, and the patient was subsequently     started on ceftazidime 1 gram IV q.12h. and ciprofloxacin 400 mg IV     q.12h.  The Pseudomonas was sensitive both to ceftazidime and to Cipro.     The patient remained febrile for approximately 36-48 hours after the     antibiotics were started; however, on hospital day #3, he became totally     afebrile.  The patient has remained afebrile for the past 48 hours.  He     will be discharged to home on Cipro 500 mg b.i.d. for an additional ten     days.  He received four and a half complete days of intravenous     antibiotics during the  hospital course.  The patient had no pulmonary     symptoms during the hospitalization.  His oxygenation saturations on room     air ranged between 99-100%.  He was admonished to continue the course of     ciprofloxacin 500 mg twice daily for an additional ten more days.  2. STATUS POST LIVER TRANSPLANT.  The patient's total bilirubin, on     admission, was 2.5, alkaline phosphatase 212, SGOT 62, and SGPT 31.  His     PT was 15.4 with an INR of 1.3, and his PTT was 38.  The patient was     maintained on his chronic medications including Prograf 1 mg b.i.d.,     Actigall, magnesium, Prilosec, and sodium bicarbonate.  The patient's     tacrolimus level was assessed and found to be slightly low at 3.0 (5.0-20     within normal range).  His magnesium level was 1.6.  Following gentle     volume repletion with normal saline, the repeated liver function test     revealed a total bilirubin of 1.8 slightly improved, alkaline phosphatase     151, SGOT 29, and SGPT 22.  Further followup and management per the     patient's hepatologist Dr. Eston Mould in [place], [place].  The     patient has planned to leave [place] in approximately one week.     He was advised to follow up with his physician in one week to ten days.  3. CHRONIC NORMOCYTIC ANEMIA.  The patient has chronic anemia by history.     He takes ferrous sulfate 325 mg b.i.d. as well as vitamin C 500 mg daily.     Iron studies were ordered during the hospital course.  His total iron was     low at 11, TIBC low at 202, percent saturation low at 5%, ferritin within     normal limits at 222, folate greater than 20, and vitamin B-12 was  elevated at 1,025.  The patient's TSH was also assessed and found to be     within normal limits at 0.794.  At the time of hospital discharge, the     patient's hemoglobin was 8.6, hematocrit was 25.4, MCV 89.8, and     platelets were 151.  The patient demonstrated no signs or symptoms    consistent  with a GI bleed.  He had no reported history of melena or     hematochezia.   DISCHARGE LABORATORIES:  Magnesium 2.0.  Sodium 138, potassium 4.8, chloride  113, CO2 20, glucose 92, BUN 12, creatinine 1.0.  Total bilirubin 1.8,  alkaline phosphatase 151, SGOT 29, SGPT 22, total protein 5.5, albumin 2.0,  calcium 8.3.  WBC 3.3, hemoglobin 8.6, hematocrit 25.4, MCV 89.8, platelets  151, absolute neutrophil count 1.8.   PROCEDURE PERFORMED:  A 2D echocardiogram on February 10, 2004.  The results  revealed overall left ventricular systolic function was normal.  Left  ventricular ejection fraction was estimated at 55-65%.  There were no left  ventricular regional wall motion abnormalities.  The left atrium was mildly  dilated.  No echocardiographic evidence for valvular vegetation.                                                Elliot Cousin, M.D.    DF/MEDQ  D:  02/13/2004  T:  02/15/2004  Job:  [id number]   cc:   Orlean Bradford, Dr.  [facility]  Fax #:  [phone]

## 2011-03-06 IMAGING — CT CT HEAD W/O CM
1 of 2 series · 16 of 30 positions shown, 20 images · non-contrast
Comparison: 05/19/2009.

CLINICAL DATA: Drug overdose.

CT HEAD WITHOUT CONTRAST
TECHNIQUE: Contiguous axial images were obtained from the base of
the skull through the vertex without contrast.

[Series 2: head_seq 4.5 h37s st · axial · 0.43mm/px · z∈[-142,+2]mm · 16 of 36 slices shown, 20 images]
[im 2/36  brain]
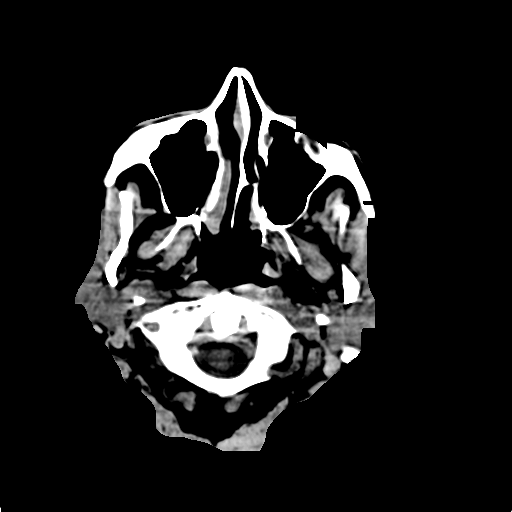
[im 2/36  bone]
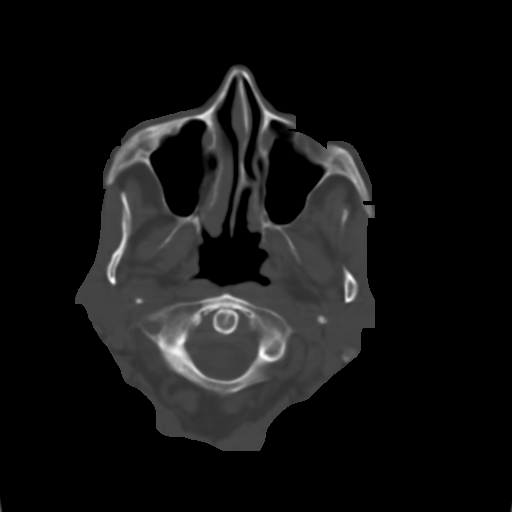
[im 4/36  brain]
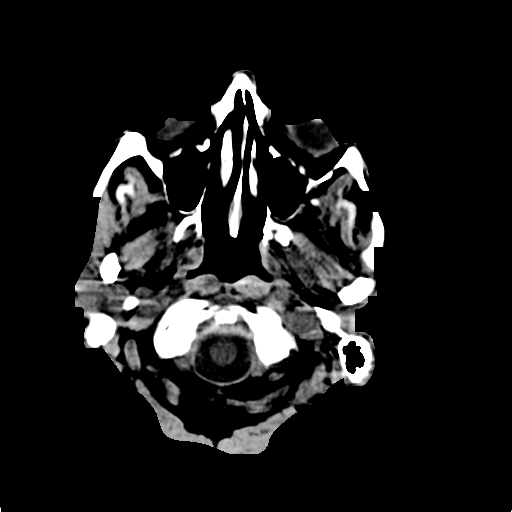
[im 6/36  brain]
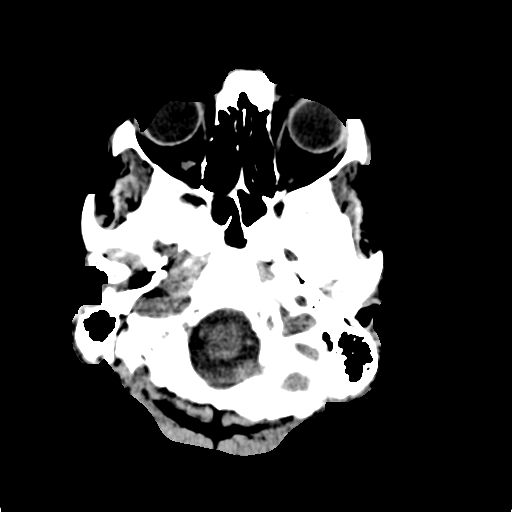
[im 9/36  brain]
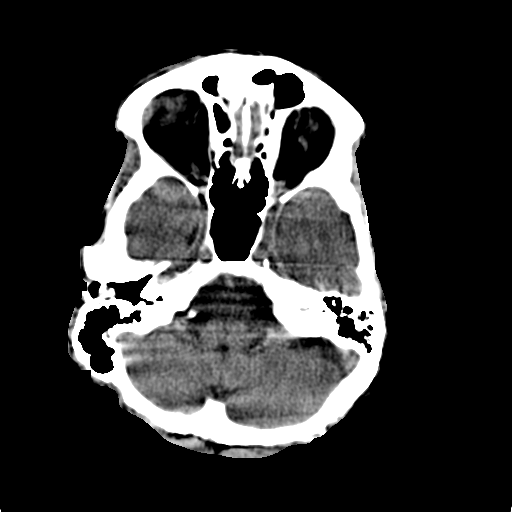
[im 11/36  brain]
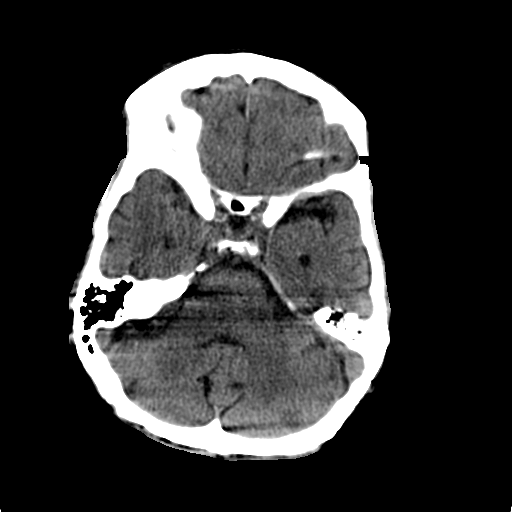
[im 11/36  bone]
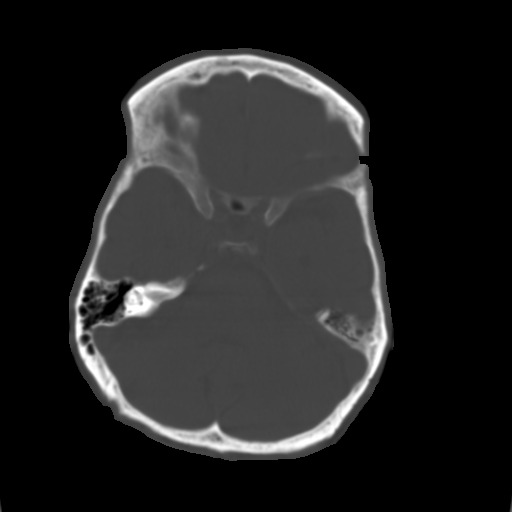
[im 13/36  brain]
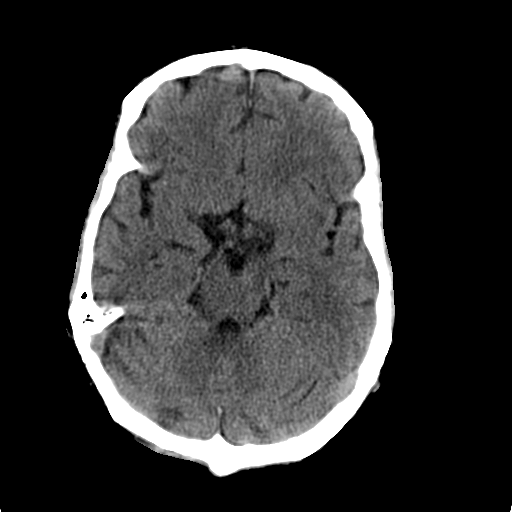
[im 15/36  brain]
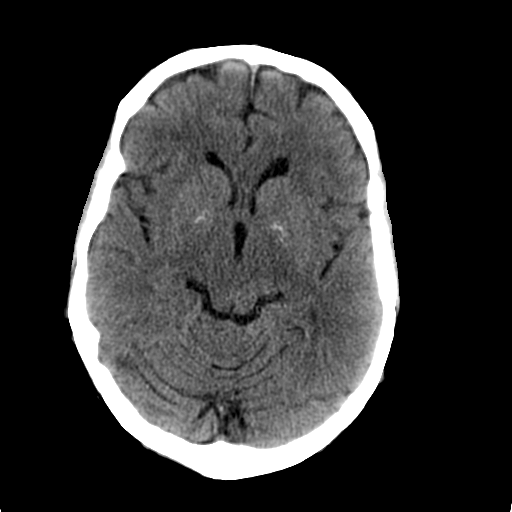
[im 16/36  brain]
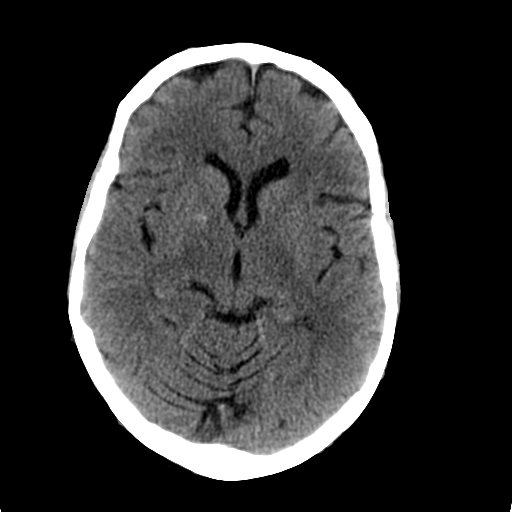
[im 20/36  brain]
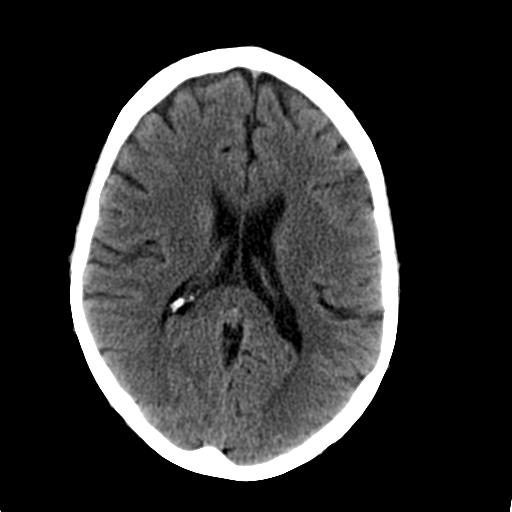
[im 20/36  bone]
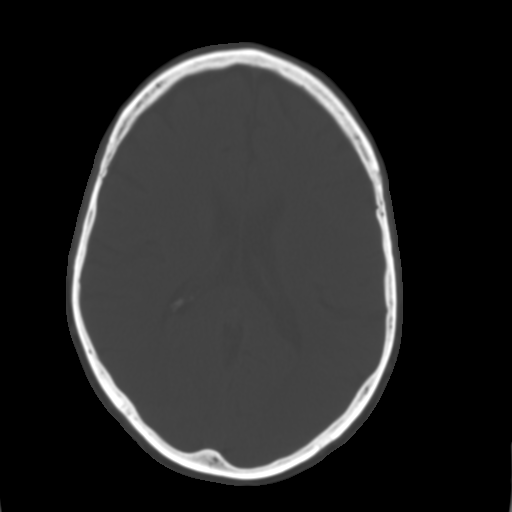
[im 22/36  brain]
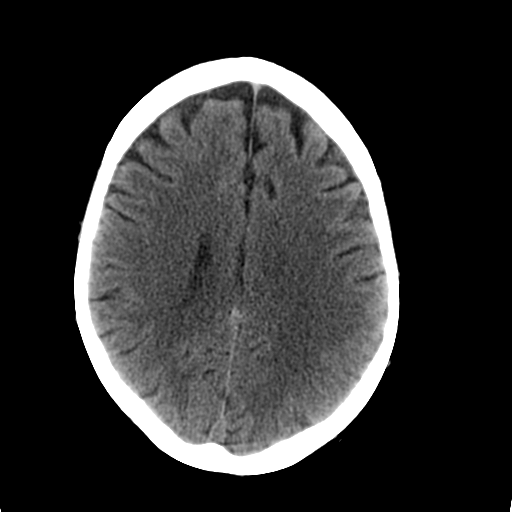
[im 23/36  brain]
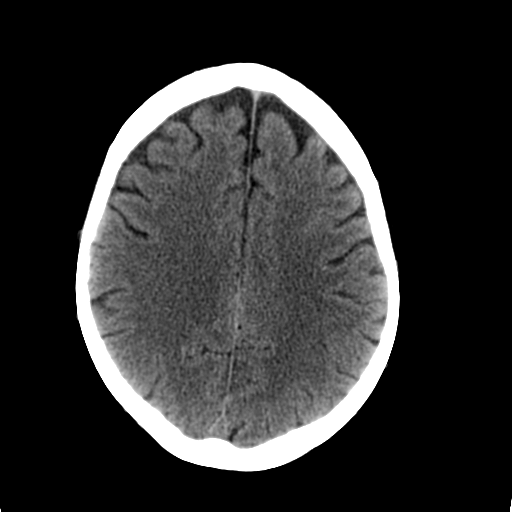
[im 25/36  brain]
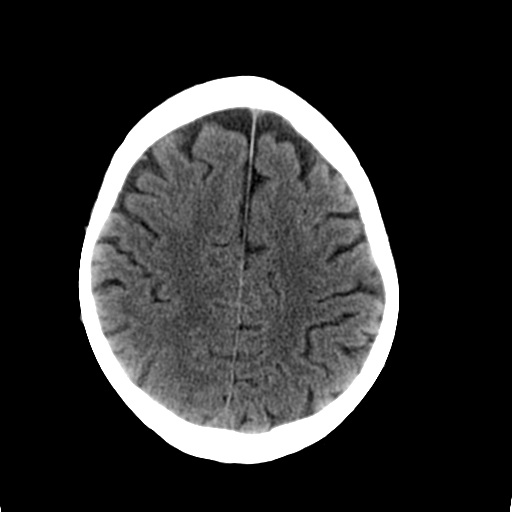
[im 27/36  brain]
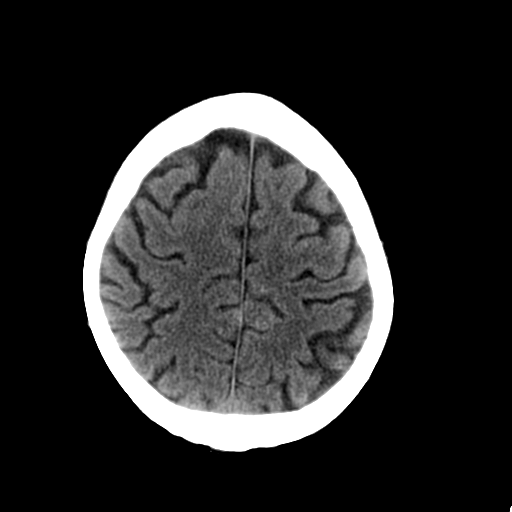
[im 27/36  bone]
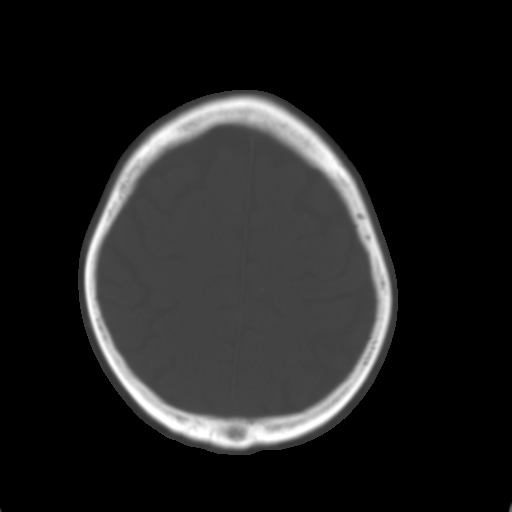
[im 30/36  brain]
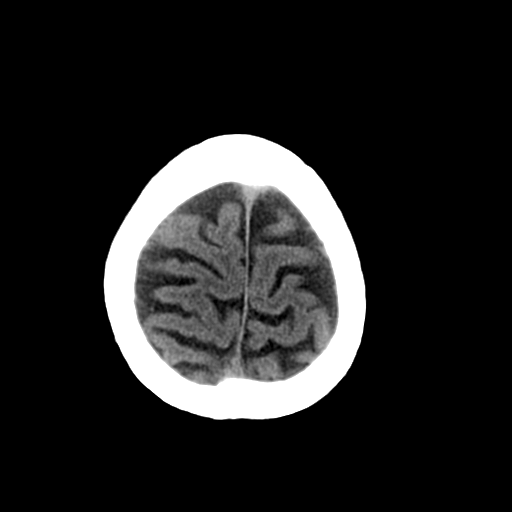
[im 32/36  brain]
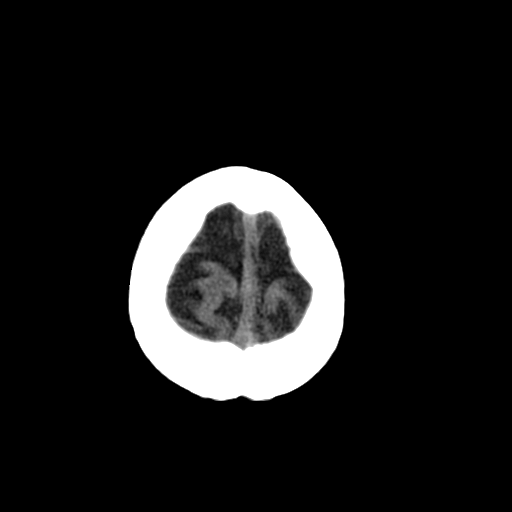
[im 34/36  brain]
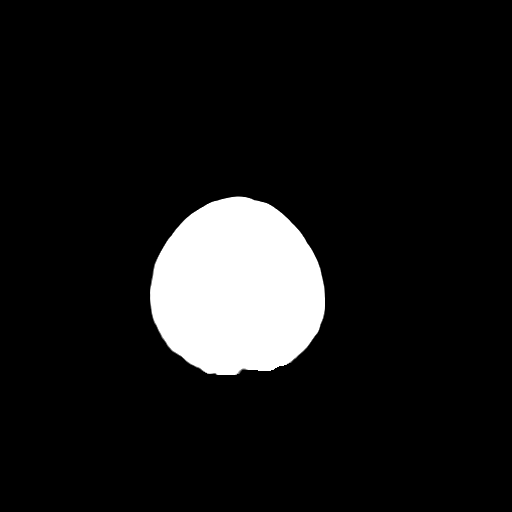

[16 of 30 positions shown; findings below may reference images not displayed]

FINDINGS: No acute intracranial abnormality is present.
Specifically, there is no evidence for acute infarct, hemorrhage,
mass, hydrocephalus, or extra-axial fluid collection.  The
paranasal sinuses and mastoid air cells are clear.  The globes and
orbits are intact.  The osseous skull is intact.
IMPRESSION: Normal CT of the brain.

## 2011-03-11 ENCOUNTER — Telehealth: Payer: Self-pay | Admitting: *Deleted

## 2011-03-11 NOTE — Telephone Encounter (Signed)
Records here - please call pt as requested

## 2011-03-11 NOTE — Telephone Encounter (Signed)
Pt requesting call from MD or MDs nurse stating that he has "a little matter to discuss w/her". Pt states that he is in [place] after returning from [place], [place] where he received liver transplant. Pt states that his MDs from [place] were suppose to send his medical records to our office.

## 2011-03-11 NOTE — Telephone Encounter (Signed)
Notified pt and let him know md did received records...03/11/11@4 ;42pm/LMB

## 2011-03-12 ENCOUNTER — Encounter: Payer: Self-pay | Admitting: Internal Medicine

## 2011-03-12 ENCOUNTER — Other Ambulatory Visit: Payer: Self-pay | Admitting: Internal Medicine

## 2011-03-12 MED ORDER — OMEPRAZOLE 20 MG PO CPDR: 20.0000 mg | DELAYED_RELEASE_CAPSULE | Freq: Every day | ORAL | Status: DC

## 2011-04-16 ENCOUNTER — Ambulatory Visit: Payer: PRIVATE HEALTH INSURANCE | Admitting: Internal Medicine

## 2011-05-04 ENCOUNTER — Ambulatory Visit (INDEPENDENT_AMBULATORY_CARE_PROVIDER_SITE_OTHER): Payer: Medicare (Managed Care) | Admitting: Internal Medicine

## 2011-05-04 ENCOUNTER — Ambulatory Visit: Payer: Self-pay | Admitting: Internal Medicine

## 2011-05-04 ENCOUNTER — Encounter: Payer: Self-pay | Admitting: Internal Medicine

## 2011-05-04 VITALS — BP 204/110 | HR 99 | Temp 98.2°F | Ht 72.0 in | Wt 134.8 lb

## 2011-05-04 DIAGNOSIS — I10 Essential (primary) hypertension: Secondary | ICD-10-CM

## 2011-05-04 DIAGNOSIS — K7200 Acute and subacute hepatic failure without coma: Secondary | ICD-10-CM

## 2011-05-04 DIAGNOSIS — Z944 Liver transplant status: Secondary | ICD-10-CM

## 2011-05-04 MED ORDER — AMLODIPINE BESYLATE 5 MG PO TABS: 5.0000 mg | ORAL_TABLET | Freq: Every day | ORAL | Status: DC

## 2011-05-04 NOTE — Assessment & Plan Note (Signed)
status post 2nd liver transplant 01/2011 - doing well in terms of liver and renal fx recovery -  Note concerns with possible prograf side effects - but no med changes in transplant therapy recommended by me today

## 2011-05-04 NOTE — Patient Instructions (Signed)
It was good to see you today. We have reviewed your prior records including labs and tests today Add amlodipine for high blood pressure to your current medicatons and reduce megace to once daily - Your prescription(s) have been submitted to your pharmacy. Please take as directed and contact our office if you believe you are having problem(s) with the medication(s). Talk with [place] about the Prograf if blood pressure still running high (over 180/100) Monitor your blood pressure at home and call here if >160/90 Please schedule followup in 2-3 weeks to recheck blood pressure, call sooner if problems.

## 2011-05-04 NOTE — Progress Notes (Signed)
  Subjective:    Patient ID: [name], male    DOB: Sep 13, 1950, 60 y.o.   MRN: [id number]  HPI here for follow up -  BP running high since 02/2011 -  No chest pain , edema or headache symptoms   Also reviewed chronic medical issues:  ESLD s/p transplant status 2004 - status post 2nd transplant 01/2011 due to dysfx, cont symptoms  resolved external bilary drain with 2nd transplant 01/2011 -  managed by GI/surg in [place] area - follows there every 4-6 weeks labs done locally q2-3weeks for [facility] [place] - last med changes in 01/2011 following 2nd transplant hosp hx in [place] 08/2010 for hep encephalopathy reports compliance with ongoing medical tx Resolution of ascites, jaundice, edema and anorexia   Past Medical History  Diagnosis Date  . THROMBOCYTOPENIA   . 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 Fayrene Fearing dewar  . Hepatic encephalopathy   . DERMATITIS   . ALCOHOL ABUSE, HX OF   . ANEMIA, IRON DEFICIENCY   . Hypertension      Review of Systems  Constitutional: Negative for fever and fatigue.  HENT: Negative for neck pain.   Respiratory: Negative for shortness of breath and wheezing.   Cardiovascular: Negative for chest pain.  Neurological: Negative for headaches.       Objective:   Physical Exam  BP 204/110  Pulse 99  Temp(Src) 98.2 F (36.8 C) (Oral)  Ht 6' (1.829 m)  Wt 134 lb 12.8 oz (61.145 kg)  BMI 18.28 kg/m2  SpO2 99% Manual recheck BP verified as here by me Constitutional:  oriented to person, place, and time. appears well-developed and well-nourished. No distress. Much improved appearance since last OV pretransplant - not jaundiced Neck: Normal range of motion. Neck supple. No JVD present. No thyromegaly present.  Cardiovascular: Normal rate, regular rhythm and normal heart sounds.  No murmur heard. no BLE edema Pulmonary/Chest: Effort normal. breath sounds normal. No  respiratory distress. no wheezes.  Abdominal: Soft. Bowel sounds are normal. No distension. There is no tenderness. Well healed scars Skin: no jaundice, Skin is warm and dry.  No erythema or ulceration.  Psychiatric: he has a normal mood and affect. behavior is normal. Judgment and thought content normal.      Assessment & Plan:  See problem list. Medications and labs reviewed today.

## 2011-05-04 NOTE — Assessment & Plan Note (Addendum)
New problem since 01/2011 following 2nd liver transplant - ?related to high dose prograf Prev low BP with hepatorenal syndrome Will continue upward titration beta-blocker as tolerated by hr and add amlodipine - erx done Also reduce dose megace from bid to qd as pt with good appetite and weight control Also encouraged to take 1/2 florinef 0.1 tab until further discussion with transplant docs in [place] - ?discontinue if not necessary for anti rejection tx Encouraged to monitor at home and call if BP uncontrolled -  Also encouraged pt to contact transplant specialists in [place] re: ?prograf side effects if persisting uncontrolled BP recheck 2 weeks, sooner if problems  BP Readings from Last 3 Encounters:  05/04/11 204/110  12/18/10 118/58  10/16/10 102/62

## 2011-05-18 ENCOUNTER — Encounter: Payer: Self-pay | Admitting: Internal Medicine

## 2011-05-18 ENCOUNTER — Ambulatory Visit (INDEPENDENT_AMBULATORY_CARE_PROVIDER_SITE_OTHER): Payer: Medicare (Managed Care) | Admitting: Internal Medicine

## 2011-05-18 VITALS — BP 158/80 | HR 63 | Temp 98.6°F | Ht 72.0 in | Wt 136.0 lb

## 2011-05-18 DIAGNOSIS — Z23 Encounter for immunization: Secondary | ICD-10-CM

## 2011-05-18 DIAGNOSIS — Z944 Liver transplant status: Secondary | ICD-10-CM

## 2011-05-18 DIAGNOSIS — I10 Essential (primary) hypertension: Secondary | ICD-10-CM

## 2011-05-18 NOTE — Assessment & Plan Note (Signed)
status post 2nd liver transplant 01/2011 - doing well in terms of liver and renal fx recovery -  Note concerns with possible prograf side effects - but no med changes in transplant therapy recommended by me today [facility] by [facility] - not repeated by me here today

## 2011-05-18 NOTE — Progress Notes (Signed)
  Subjective:    Patient ID: [name], male    DOB: 1951-01-02, 60 y.o.   MRN: [id number]  HPI here for follow up HTN - Seen 2 weeks ago for same due to BP running high since 02/2011 -  meds adjusted >>improved blood pressure readings No chest pain , edema or headache symptoms   Also reviewed chronic medical issues:  ESLD s/p transplant status 2004 - status post 2nd transplant 01/2011 due to dysfx, cont symptoms  resolved external bilary drain with 2nd transplant 01/2011 -  managed by GI/surg in [place] area - follows there every 4-6 weeks labs done locally q2-3weeks for [facility] [place] - last med changes in 01/2011 following 2nd transplant hosp hx in [place] 08/2010 for hep encephalopathy reports compliance with ongoing medical tx Resolution of ascites, jaundice, edema and anorexia   Past Medical History  Diagnosis Date  . THROMBOCYTOPENIA   . 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 Fayrene Fearing dewar  . Hepatic encephalopathy   . DERMATITIS   . ALCOHOL ABUSE, HX OF   . ANEMIA, IRON DEFICIENCY   . Hypertension      Review of Systems  Constitutional: Negative for fever and fatigue.  HENT: Negative for neck pain.   Respiratory: Negative for shortness of breath and wheezing.   Cardiovascular: Negative for chest pain.  Neurological: Negative for headaches.       Objective:   Physical Exam  BP 158/80  Pulse 63  Temp(Src) 98.6 F (37 C) (Oral)  Ht 6' (1.829 m)  Wt 136 lb (61.689 kg)  BMI 18.44 kg/m2  SpO2 97% Manual recheck BP verified as here by me Constitutional:  appears well-developed and well-nourished. No distress. Much improved appearance since last OV pretransplant - not jaundiced Neck: Normal range of motion. Neck supple. No JVD present. No thyromegaly present.  Cardiovascular: Normal rate, regular rhythm and normal heart sounds.  No murmur heard. no BLE edema Pulmonary/Chest:  Effort normal. breath sounds normal. No respiratory distress. no wheezes.  Psychiatric: he has a normal mood and affect. behavior is normal. Judgment and thought content normal.      Assessment & Plan:  See problem list. Medications and labs reviewed today.

## 2011-05-18 NOTE — Patient Instructions (Signed)
It was good to see you today. blood pressure improving Will stop all florinef and use Megace only if neededIt was good to see you today. continue amlodipine and metoprolol follow up with [place] about the Prograf if blood pressure still running high (over 140/90) Monitor your blood pressure at home and call here if >140/90 Please schedule followup in 3 months to recheck blood pressure, call sooner if problems.

## 2011-05-18 NOTE — Assessment & Plan Note (Signed)
New problem since 01/2011 following 2nd liver transplant - ?related to high dose prograf Prev low BP with hepatorenal syndrome 04/2011 mde upward titration beta-blocker as tolerated by hr and added amlodipine -  Also reduced dose megace from bid to qd as pt with good appetite and weight control Will stop all florinef (as not necessary for anti rejection tx) Encouraged to continue to monitor at home and call if BP uncontrolled -  Also encouraged pt to contact transplant specialists in [place] re: ?prograf side effects if persisting uncontrolled BP recheck 3 months, sooner if problems  BP Readings from Last 3 Encounters:  05/18/11 158/80  05/04/11 204/110  12/18/10 118/58

## 2011-06-10 LAB — CBC
HCT: 21.6 % — ABNORMAL LOW (ref 39.0–52.0)
HCT: 26 % — ABNORMAL LOW (ref 39.0–52.0)
HCT: 26.8 % — ABNORMAL LOW (ref 39.0–52.0)
HCT: 28.3 % — ABNORMAL LOW (ref 39.0–52.0)
Hemoglobin: 9.1 g/dL — ABNORMAL LOW (ref 13.0–17.0)
Hemoglobin: 9.1 g/dL — ABNORMAL LOW (ref 13.0–17.0)
Hemoglobin: 9.5 g/dL — ABNORMAL LOW (ref 13.0–17.0)
MCHC: 34.1 g/dL (ref 30.0–36.0)
MCV: 91.4 fL (ref 78.0–100.0)
MCV: 92.6 fL (ref 78.0–100.0)
MCV: 94.5 fL (ref 78.0–100.0)
Platelets: 74 10*3/uL — ABNORMAL LOW (ref 150–400)
Platelets: UNDETERMINED 10*3/uL (ref 150–400)
RBC: 2.84 MIL/uL — ABNORMAL LOW (ref 4.22–5.81)
RBC: 2.89 MIL/uL — ABNORMAL LOW (ref 4.22–5.81)
RDW: 17.7 % — ABNORMAL HIGH (ref 11.5–15.5)
RDW: 18.2 % — ABNORMAL HIGH (ref 11.5–15.5)
WBC: 5.1 10*3/uL (ref 4.0–10.5)
WBC: 5.7 10*3/uL (ref 4.0–10.5)

## 2011-06-10 LAB — COMPREHENSIVE METABOLIC PANEL
ALT: 22 U/L (ref 0–53)
AST: 51 U/L — ABNORMAL HIGH (ref 0–37)
Alkaline Phosphatase: 161 U/L — ABNORMAL HIGH (ref 39–117)
Alkaline Phosphatase: 164 U/L — ABNORMAL HIGH (ref 39–117)
Alkaline Phosphatase: 184 U/L — ABNORMAL HIGH (ref 39–117)
BUN: 15 mg/dL (ref 6–23)
BUN: 18 mg/dL (ref 6–23)
CO2: 15 mEq/L — ABNORMAL LOW (ref 19–32)
Chloride: 108 mEq/L (ref 96–112)
Chloride: 109 mEq/L (ref 96–112)
Creatinine, Ser: 0.97 mg/dL (ref 0.4–1.5)
Creatinine, Ser: 1.12 mg/dL (ref 0.4–1.5)
GFR calc Af Amer: >60 mL/min (ref >60)
GFR calc non Af Amer: >60 mL/min (ref >60)
GFR calc non Af Amer: >60 mL/min (ref >60)
Glucose, Bld: 105 mg/dL — ABNORMAL HIGH (ref 70–99)
Glucose, Bld: 113 mg/dL — ABNORMAL HIGH (ref 70–99)
Glucose, Bld: 115 mg/dL — ABNORMAL HIGH (ref 70–99)
Potassium: 3.6 mEq/L (ref 3.5–5.1)
Potassium: 4 mEq/L (ref 3.5–5.1)
Potassium: 4.1 mEq/L (ref 3.5–5.1)
Sodium: 136 mEq/L (ref 135–145)
Total Bilirubin: 2 mg/dL — ABNORMAL HIGH (ref 0.3–1.2)
Total Bilirubin: 2.6 mg/dL — ABNORMAL HIGH (ref 0.3–1.2)
Total Protein: 6.4 g/dL (ref 6.0–8.3)

## 2011-06-10 LAB — BODY FLUID CULTURE: Gram Stain: NONE SEEN

## 2011-06-10 LAB — PROTIME-INR
INR: 1.5 (ref 0.00–1.49)
Prothrombin Time: 18.7 seconds — ABNORMAL HIGH (ref 11.6–15.2)
Prothrombin Time: 20.7 seconds — ABNORMAL HIGH (ref 11.6–15.2)

## 2011-06-10 LAB — URINALYSIS, ROUTINE W REFLEX MICROSCOPIC
Bilirubin Urine: NEGATIVE
Ketones, ur: NEGATIVE mg/dL
Protein, ur: NEGATIVE mg/dL
Protein, ur: NEGATIVE mg/dL
Specific Gravity, Urine: 1.019 (ref 1.005–1.030)
Urobilinogen, UA: 0.2 mg/dL (ref 0.0–1.0)
Urobilinogen, UA: 1 mg/dL (ref 0.0–1.0)

## 2011-06-10 LAB — CULTURE, BLOOD (ROUTINE X 2): Culture: NO GROWTH

## 2011-06-10 LAB — ETHANOL: Alcohol, Ethyl (B): <5 mg/dL (ref 0–10)

## 2011-06-10 LAB — AMMONIA
Ammonia: 122 umol/L — ABNORMAL HIGH (ref 11–35)
Ammonia: 146 umol/L — ABNORMAL HIGH (ref 11–35)
Ammonia: 69 umol/L — ABNORMAL HIGH (ref 11–35)

## 2011-06-10 LAB — BODY FLUID CELL COUNT WITH DIFFERENTIAL: Lymphs, Fluid: 64 %

## 2011-06-10 LAB — RAPID URINE DRUG SCREEN, HOSP PERFORMED
Benzodiazepines: NOT DETECTED
Cocaine: NOT DETECTED
Opiates: NOT DETECTED
Tetrahydrocannabinol: NOT DETECTED

## 2011-06-10 LAB — GLUCOSE, CAPILLARY: Glucose-Capillary: 167 mg/dL — ABNORMAL HIGH (ref 70–99)

## 2011-06-10 LAB — MAGNESIUM: Magnesium: 1.6 mg/dL (ref 1.5–2.5)

## 2011-06-10 LAB — DIFFERENTIAL
Basophils Absolute: 0 10*3/uL (ref 0.0–0.1)
Eosinophils Absolute: 0.3 10*3/uL (ref 0.0–0.7)
Lymphocytes Relative: 10 % — ABNORMAL LOW (ref 12–46)
Monocytes Relative: 8 % (ref 3–12)
Neutrophils Relative %: 77 % (ref 43–77)

## 2011-06-10 LAB — PHOSPHORUS: Phosphorus: 3.2 mg/dL (ref 2.3–4.6)

## 2011-06-10 LAB — URINE MICROSCOPIC-ADD ON

## 2011-06-10 LAB — CROSSMATCH

## 2011-06-10 LAB — PROTEIN, BODY FLUID: Total protein, fluid: <3 g/dL

## 2011-06-10 LAB — LACTATE DEHYDROGENASE, PLEURAL OR PERITONEAL FLUID: LD, Fluid: 26 U/L — ABNORMAL HIGH (ref 3–23)

## 2011-06-10 LAB — CALCIUM: Calcium: 7.9 mg/dL — ABNORMAL LOW (ref 8.4–10.5)

## 2011-06-11 LAB — URINE CULTURE: Colony Count: NO GROWTH

## 2011-06-11 LAB — POCT TOXICOLOGY PANEL

## 2011-06-11 LAB — DIFFERENTIAL
Eosinophils Relative: 6 % — ABNORMAL HIGH (ref 0–5)
Monocytes Relative: 9 % (ref 3–12)
Neutrophils Relative %: 72 % (ref 43–77)

## 2011-06-11 LAB — URINALYSIS, ROUTINE W REFLEX MICROSCOPIC
Ketones, ur: NEGATIVE mg/dL
Leukocytes, UA: NEGATIVE
Nitrite: NEGATIVE
Protein, ur: NEGATIVE mg/dL

## 2011-06-11 LAB — CBC
HCT: 26.1 % — ABNORMAL LOW (ref 39.0–52.0)
Hemoglobin: 8.7 g/dL — ABNORMAL LOW (ref 13.0–17.0)
MCHC: 33.3 g/dL (ref 30.0–36.0)
Platelets: DECREASED 10*3/uL (ref 150–400)
RDW: 17.4 % — ABNORMAL HIGH (ref 11.5–15.5)

## 2011-06-11 LAB — COMPREHENSIVE METABOLIC PANEL
BUN: 16 mg/dL (ref 6–23)
Calcium: 8.3 mg/dL — ABNORMAL LOW (ref 8.4–10.5)
Glucose, Bld: 100 mg/dL — ABNORMAL HIGH (ref 70–99)
Sodium: 138 mEq/L (ref 135–145)
Total Protein: 7.8 g/dL (ref 6.0–8.3)

## 2011-06-11 LAB — CULTURE, BLOOD (ROUTINE X 2)

## 2011-06-11 LAB — PROTIME-INR
INR: 1.5 (ref 0.00–1.49)
Prothrombin Time: 18.9 seconds — ABNORMAL HIGH (ref 11.6–15.2)

## 2011-06-11 LAB — APTT: aPTT: 39 seconds — ABNORMAL HIGH (ref 24–37)

## 2011-06-11 LAB — POCT CARDIAC MARKERS: Troponin i, poc: <0.05 ng/mL (ref 0.00–0.09)

## 2011-06-11 LAB — URINE MICROSCOPIC-ADD ON

## 2011-07-19 ENCOUNTER — Telehealth: Payer: Self-pay

## 2011-07-19 MED ORDER — AZITHROMYCIN 250 MG PO TABS: ORAL_TABLET | ORAL | Status: AC

## 2011-07-19 NOTE — Telephone Encounter (Signed)
Pt called complaining of chest congestion, cough, ST and body aches. Pt is unable to come in for OV until Wed of this week due to transportation issues and also states that he is immunocompromised. He is requesting Rx for Zpak to pharmacy, please advise.

## 2011-07-19 NOTE — Telephone Encounter (Signed)
Pt advised of Rx/pharmacy via VM 

## 2011-07-19 NOTE — Telephone Encounter (Signed)
Zpak ex - [facility] - call if symptoms worse or unimproved for OV eval -thanks

## 2011-09-22 ENCOUNTER — Encounter: Payer: Self-pay | Admitting: Internal Medicine

## 2011-09-22 ENCOUNTER — Other Ambulatory Visit: Payer: Medicare (Managed Care)

## 2011-09-22 ENCOUNTER — Ambulatory Visit (INDEPENDENT_AMBULATORY_CARE_PROVIDER_SITE_OTHER): Payer: Medicare (Managed Care) | Admitting: Internal Medicine

## 2011-09-22 VITALS — BP 130/70 | HR 75 | Temp 98.7°F | Wt 141.4 lb

## 2011-09-22 DIAGNOSIS — Z944 Liver transplant status: Secondary | ICD-10-CM

## 2011-09-22 DIAGNOSIS — R109 Unspecified abdominal pain: Secondary | ICD-10-CM

## 2011-09-22 LAB — URINALYSIS, ROUTINE W REFLEX MICROSCOPIC
Bilirubin Urine: NEGATIVE
Ketones, ur: NEGATIVE
Leukocytes, UA: NEGATIVE
Specific Gravity, Urine: 1.02 (ref 1.000–1.030)
Urobilinogen, UA: 0.2 (ref 0.0–1.0)

## 2011-09-22 NOTE — Patient Instructions (Addendum)
It was good to see you today. Test(s) ordered today: urinalysis and ultrasound. Your results will be called to you after review (48-72hours after test completion). If any changes need to be made, you will be notified at that time. Medications reviewed, no changes at this time. Call if symptoms worse or unimproved for other evaluation or treatment as needed

## 2011-09-22 NOTE — Progress Notes (Signed)
  Subjective:    Patient ID: [name], male    DOB: 1951/04/08, 61 y.o.   MRN: [id number]  HPI complains of L flank pain Onset 6 week ago No radiation of pain into chest, back or suprapubic region No fever but associated with increase in urination, ?discoloration -  No hx kidney stones but ?dc urogall and increase Ca intake precipitating problem No rash Pain 5/10 waking him from sleep - Pain improved with position change and out of bed  Also reviewed chronic medical issues:  ESLD s/p transplant status 2004 - status post 2nd transplant 01/2011 due to dysfx, cont symptoms  resolved external bilary drain with 2nd transplant 01/2011 -  managed by GI/surg in [place] area - follows there every 4-6 weeks labs done locally q2-3weeks [facility] [place] - last med changes in 01/2011 following 2nd transplant hosp hx in [place] 08/2010 for hep encephalopathy reports compliance with ongoing medical tx Resolution of ascites, jaundice, edema and anorexia  Past Medical History  Diagnosis Date  . THROMBOCYTOPENIA   . 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 Fayrene Fearing dewar  . Hepatic encephalopathy   . DERMATITIS   . ALCOHOL ABUSE, HX OF   . ANEMIA, IRON DEFICIENCY   . Hypertension     Review of Systems  Constitutional: Negative for fever, activity change and fatigue.  Gastrointestinal: Negative for abdominal pain and abdominal distention.  Musculoskeletal: Negative for back pain and joint swelling.       Objective:   Physical Exam BP 130/70  Pulse 75  Temp(Src) 98.7 F (37.1 C) (Oral)  Wt 141 lb 6.4 oz (64.139 kg)  SpO2 97% Wt Readings from Last 3 Encounters:  09/22/11 141 lb 6.4 oz (64.139 kg)  05/18/11 136 lb (61.689 kg)  05/04/11 134 lb 12.8 oz (61.145 kg)   Constitutional:  appears well-developed and well-nourished. No distress. Much improved appearance since OV pretransplant - not  jaundiced Neck: Normal range of motion. Neck supple. No JVD present. No thyromegaly present.  Cardiovascular: Normal rate, regular rhythm and normal heart sounds.  No murmur heard. no BLE edema Pulmonary/Chest: Effort normal. breath sounds normal. No respiratory distress. no wheezes.  Mskel: Back: full range of motion of thoracic and lumbar spine. Non tender to palpation. Negative straight leg raise. DTR's are symmetrically intact. Sensation intact in all dermatomes of the lower extremities. Full strength to manual muscle testing. patient is able to heel toe walk without difficulty and ambulates with antalgic gait.  Psychiatric: he has a normal mood and affect. behavior is normal. Judgment and thought content normal.        Assessment & Plan:  L flank pain - associated with increase urination - improved with position change - ?Mskel vs renal colic - check UA and [place] - consider CT or other Mskel imaging depending on test results - no need for pain medication or changes tx at this time pending clarified dx or problem

## 2011-09-23 ENCOUNTER — Ambulatory Visit
Admission: RE | Admit: 2011-09-23 | Discharge: 2011-09-23 | Disposition: A | Discharge status: Home / Self Care | Payer: Medicare (Managed Care) | Source: Ambulatory Visit | Attending: Internal Medicine | Admitting: Internal Medicine

## 2011-09-23 ENCOUNTER — Other Ambulatory Visit: Payer: Self-pay | Admitting: Internal Medicine

## 2011-09-23 DIAGNOSIS — R109 Unspecified abdominal pain: Secondary | ICD-10-CM

## 2011-09-23 DIAGNOSIS — Z944 Liver transplant status: Secondary | ICD-10-CM

## 2011-09-29 ENCOUNTER — Ambulatory Visit (INDEPENDENT_AMBULATORY_CARE_PROVIDER_SITE_OTHER)
Admission: RE | Admit: 2011-09-29 | Discharge: 2011-09-29 | Disposition: A | Discharge status: Home / Self Care | Payer: Medicare (Managed Care) | Source: Ambulatory Visit | Attending: Internal Medicine | Admitting: Internal Medicine

## 2011-09-29 DIAGNOSIS — R109 Unspecified abdominal pain: Secondary | ICD-10-CM

## 2011-09-29 DIAGNOSIS — Z944 Liver transplant status: Secondary | ICD-10-CM

## 2011-09-29 MED ORDER — IOHEXOL 300 MG/ML  SOLN
80.0000 mL | Freq: Once | INTRAMUSCULAR | Status: AC | PRN
  Administered 2011-09-29: 80 mL via INTRAVENOUS

## 2011-09-30 ENCOUNTER — Other Ambulatory Visit: Payer: Self-pay | Admitting: Internal Medicine

## 2011-09-30 MED ORDER — CYCLOBENZAPRINE HCL 5 MG PO TABS: 5.0000 mg | ORAL_TABLET | Freq: Two times a day (BID) | ORAL | Status: DC | PRN

## 2011-10-05 ENCOUNTER — Encounter: Payer: Self-pay | Admitting: Internal Medicine

## 2011-11-12 ENCOUNTER — Other Ambulatory Visit: Payer: Self-pay | Admitting: Internal Medicine

## 2012-01-04 IMAGING — CR DG CHEST 1V PORT
1 series · 1 of 1 positions shown · non-contrast
Comparison: Prior today

CLINICAL DATA: Ventilator dependent respiratory failure.  Altered
level consciousness.  Hepatic failure.

PORTABLE CHEST - 1 VIEW

[view not recorded]
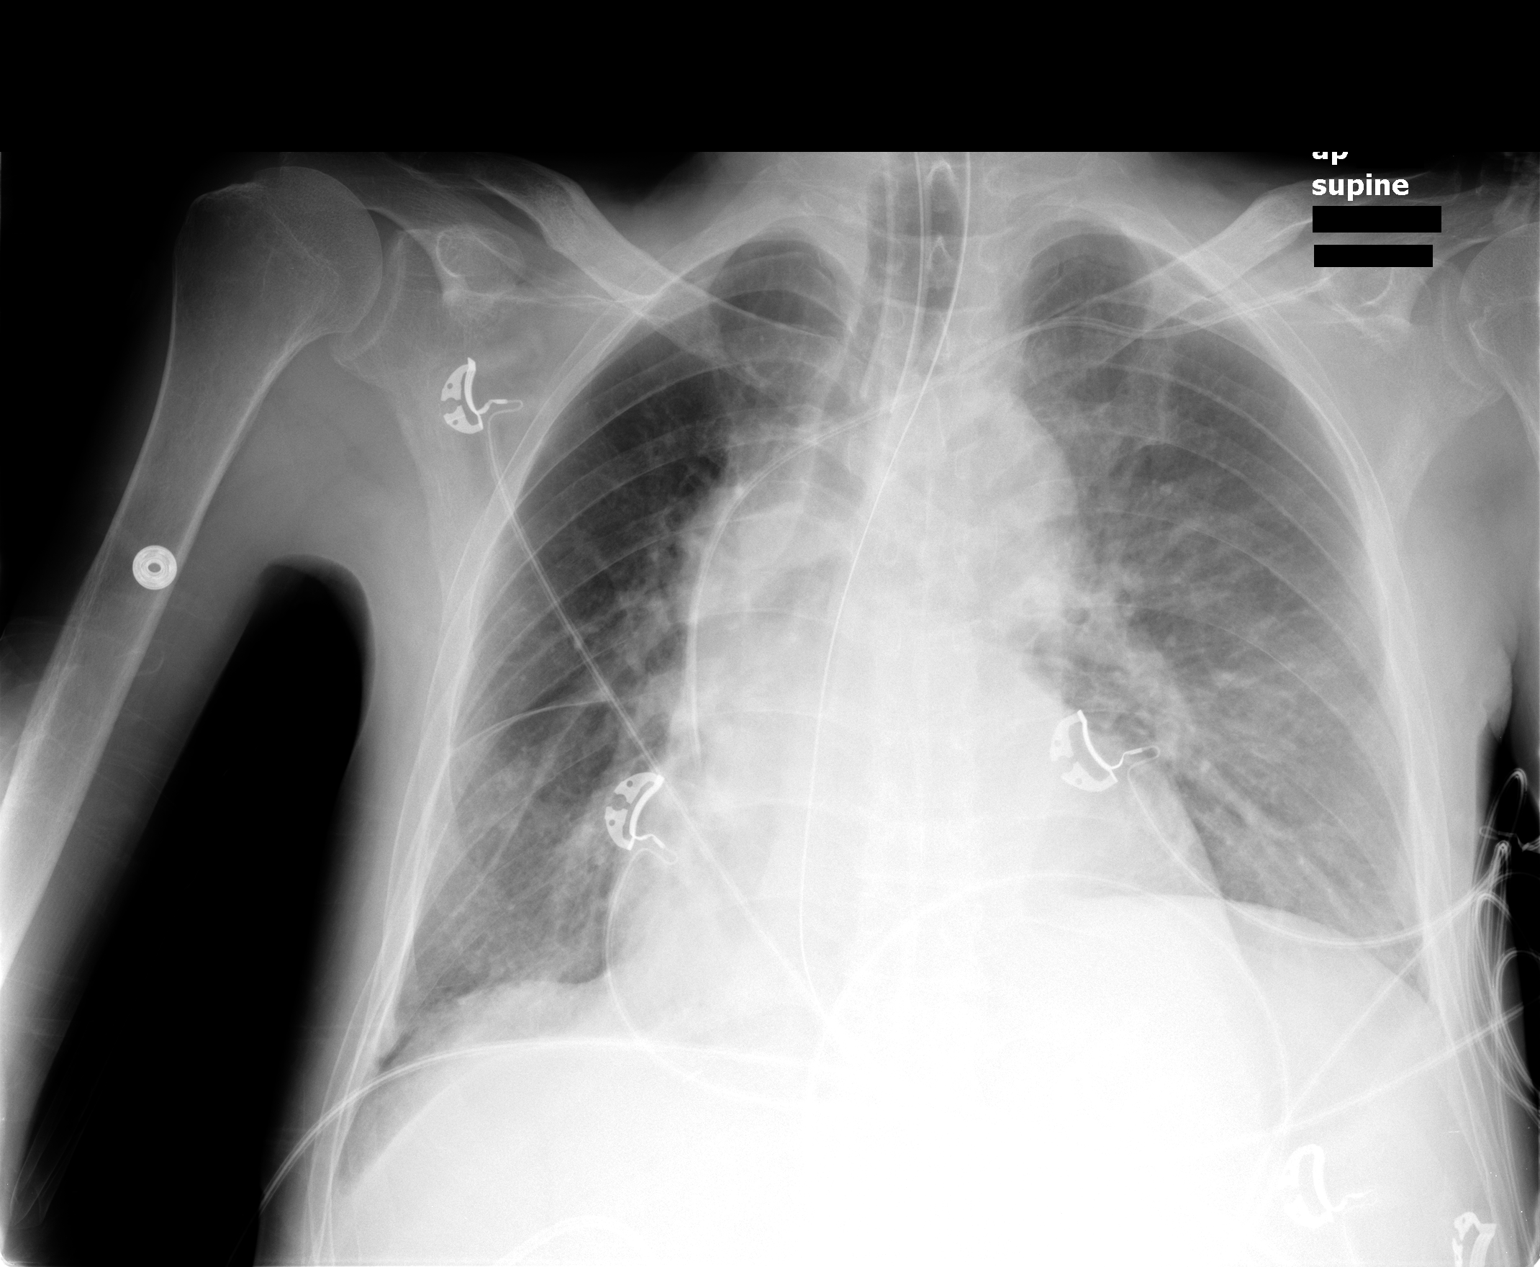

[1 of 1 positions shown; findings below may reference images not displayed]

FINDINGS: New left subclavian center venous catheter is seen with
tip overlying expected cavoatrial junction.  No evidence of
pneumothorax.

Increased interstitial prominence is seen bilaterally, suspicious
for increasing interstitial edema.  No evidence of pulmonary
consolidation or definite pleural effusion.  Cardiomegaly stable.
The endotracheal tube and nasogastric tube remain in appropriate
position.
IMPRESSION: 1.  New left subclavian center venous catheter in appropriate
position.  No evidence of pneumothorax.
2.  Suspect  developing mild interstitial edema.  Stable
cardiomegaly.

## 2012-04-01 ENCOUNTER — Other Ambulatory Visit: Payer: Self-pay | Admitting: Internal Medicine

## 2014-10-07 ENCOUNTER — Emergency Department (HOSPITAL_COMMUNITY)
Admission: EM | Admit: 2014-10-07 | Discharge: 2014-10-07 | Disposition: A | Discharge status: Home / Self Care | Payer: Medicare (Managed Care) | Attending: Emergency Medicine | Admitting: Emergency Medicine

## 2014-10-07 ENCOUNTER — Emergency Department (HOSPITAL_COMMUNITY): Payer: Medicare (Managed Care)

## 2014-10-07 ENCOUNTER — Encounter (HOSPITAL_COMMUNITY): Payer: Self-pay | Admitting: Emergency Medicine

## 2014-10-07 DIAGNOSIS — Z79899 Other long term (current) drug therapy: Secondary | ICD-10-CM | POA: Diagnosis not present

## 2014-10-07 DIAGNOSIS — Z944 Liver transplant status: Secondary | ICD-10-CM | POA: Diagnosis not present

## 2014-10-07 DIAGNOSIS — S2232XA Fracture of one rib, left side, initial encounter for closed fracture: Secondary | ICD-10-CM | POA: Diagnosis not present

## 2014-10-07 DIAGNOSIS — I10 Essential (primary) hypertension: Secondary | ICD-10-CM | POA: Insufficient documentation

## 2014-10-07 DIAGNOSIS — D509 Iron deficiency anemia, unspecified: Secondary | ICD-10-CM | POA: Diagnosis not present

## 2014-10-07 DIAGNOSIS — Z8719 Personal history of other diseases of the digestive system: Secondary | ICD-10-CM | POA: Diagnosis not present

## 2014-10-07 DIAGNOSIS — W000XXA Fall on same level due to ice and snow, initial encounter: Secondary | ICD-10-CM | POA: Diagnosis not present

## 2014-10-07 DIAGNOSIS — Y998 Other external cause status: Secondary | ICD-10-CM | POA: Insufficient documentation

## 2014-10-07 DIAGNOSIS — Z872 Personal history of diseases of the skin and subcutaneous tissue: Secondary | ICD-10-CM | POA: Diagnosis not present

## 2014-10-07 DIAGNOSIS — Y9389 Activity, other specified: Secondary | ICD-10-CM | POA: Insufficient documentation

## 2014-10-07 DIAGNOSIS — Y9289 Other specified places as the place of occurrence of the external cause: Secondary | ICD-10-CM | POA: Diagnosis not present

## 2014-10-07 DIAGNOSIS — S299XXA Unspecified injury of thorax, initial encounter: Secondary | ICD-10-CM | POA: Diagnosis present

## 2014-10-07 LAB — URINALYSIS, ROUTINE W REFLEX MICROSCOPIC
BILIRUBIN URINE: NEGATIVE
Glucose, UA: NEGATIVE mg/dL
KETONES UR: NEGATIVE mg/dL
Leukocytes, UA: NEGATIVE
NITRITE: NEGATIVE
Protein, ur: 30 mg/dL — AB
SPECIFIC GRAVITY, URINE: 1.004 — AB (ref 1.005–1.030)
UROBILINOGEN UA: 0.2 mg/dL (ref 0.0–1.0)
pH: 7.5 (ref 5.0–8.0)

## 2014-10-07 LAB — URINE MICROSCOPIC-ADD ON

## 2014-10-07 MED ORDER — DIAZEPAM 5 MG PO TABS: 5.0000 mg | ORAL_TABLET | Freq: Four times a day (QID) | ORAL | Status: DC | PRN

## 2014-10-07 MED ORDER — OXYCODONE HCL 5 MG PO TABS: 5.0000 mg | ORAL_TABLET | ORAL | Status: DC | PRN

## 2014-10-07 MED ORDER — DIAZEPAM 2 MG PO TABS
2.0000 mg | ORAL_TABLET | Freq: Once | ORAL | Status: AC
  Administered 2014-10-07: 2 mg via ORAL
  Filled 2014-10-07: qty 1

## 2014-10-07 NOTE — ED Notes (Signed)
Pt c/o injury to left flank, rib pain, after fall last week causing him to twist his back. Pt denies hitting ground, denies head injury.

## 2014-10-07 NOTE — ED Provider Notes (Signed)
CSN: [id number]     Arrival date & time 10/07/14  1236 History   First MD Initiated Contact with Patient 10/07/14 1351     Chief Complaint  Patient presents with  . Rib Pain    HPI  Patient is a 64 year old male with past medical history of liver transplant, anemia, and thrombocytopenia who presents emergency room for evaluation of left sided pain. Patient states that approximately a week in 2 days ago he slipped on the ice and nearly fell and developed left side pain. He states his pain is constant and aching. It is sometimes worse with bending movements and twisting movements. Tylenol does help slightly. He has also been trying heating pads at home with some relief. Patient denies any other injuries. He states that he did not strike the ground when he nearly fell. He did not his head he did not lose consciousness. He states that he is nervous to take Tylenol or NSAIDs given chronic kidney disease and liver transplant. He spends some time down here [place] in [place].  Past Medical History  Diagnosis Date  . THROMBOCYTOPENIA   . HEPATIC FAILURE, END STAGE 2004    liver transplant 2004; 2nd orthotopic liver transplant 01/25/11 [facility] [phone] dr. Fayrene Fearingjames dewar  . Hepatic encephalopathy   . DERMATITIS   . ALCOHOL ABUSE, HX OF   . ANEMIA, IRON DEFICIENCY   . Hypertension    Past Surgical History  Procedure Laterality Date  . Liver transplant  1st 04/21/03; 2nd 01/25/11    [facility]. dewar [phone];  [phone]  . (l) arm broke    . Hernia repair    . Broke (l) ankle   2001   Family History  Problem Relation Age of Onset  . Arthritis Mother   . Hypertension Mother   . Lung cancer Father   . Heart disease Father   . Cancer Father     lung cancer  . Hypertension Sister    History  Substance Use Topics  . Smoking status: Never Smoker   . Smokeless tobacco: Never Used     Comment: Remotely remarried-divorced in 1979 after 3 kids. Single,  lives with g-friend Vernona RiegerLaura. Disabled  . Alcohol Use: No     Comment: No alcohol since 2003    Review of Systems  Constitutional: Negative for fever, chills and fatigue.  Respiratory: Negative for cough, chest tightness and shortness of breath.   Cardiovascular: Negative for chest pain and palpitations.  Gastrointestinal: Negative for nausea, vomiting, abdominal pain, diarrhea and constipation.  Genitourinary: Negative for dysuria, urgency, frequency, hematuria, enuresis and difficulty urinating.  Skin: Negative for rash.  Neurological: Negative for numbness.  All other systems reviewed and are negative.     Allergies  Moxifloxacin  Home Medications   Prior to Admission medications   Medication Sig Start Date End Date Taking? Authorizing Provider  acetaminophen (TYLENOL) 500 MG tablet Take 1,000 mg by mouth every 6 (six) hours as needed for moderate pain (pain).   Yes Historical Provider, MD  amLODipine (NORVASC) 5 MG tablet TAKE ONE TABLET BY MOUTH EVERY DAY 04/01/12  Yes Newt LukesValerie A Leschber, MD  Ascorbic Acid (VITAMIN C) 500 MG tablet Take 500 mg by mouth daily.     Yes Historical Provider, MD  Calcium-Magnesium-Vitamin D (CITRACAL CALCIUM+D PO) Take 1 tablet by mouth 2 (two) times daily.    Yes Historical Provider, MD  ferrous sulfate 325 (65 FE) MG tablet Take 325 mg by  mouth daily with breakfast.    Yes Historical Provider, MD  Fludrocortisone Acetate (FLORINEF PO) Take 0.1 mg by mouth daily.   Yes Historical Provider, MD  hydrochlorothiazide (HYDRODIURIL) 25 MG tablet Take 12.5 mg by mouth daily.    Yes Historical Provider, MD  Magnesium Oxide 400 MG CAPS Take 400 mg by mouth 3 (three) times daily as needed (cramps).    Yes Historical Provider, MD  metoprolol (LOPRESSOR) 50 MG tablet Take 1 tablet (50 mg total) by mouth 2 (two) times daily. 05/04/11  Yes Newt Lukes, MD  Multiple Vitamin (MULTIVITAMIN) tablet Take 1 tablet by mouth daily.     Yes Historical Provider, MD   omeprazole (PRILOSEC) 20 MG capsule Take 20 mg by mouth daily.   Yes Historical Provider, MD  sodium bicarbonate 650 MG tablet Take 1,300 mg by mouth 2 (two) times daily.    Yes Historical Provider, MD  sodium polystyrene (KAYEXALATE) powder Take 20 g by mouth daily as needed (lower potassium levels).   Yes Historical Provider, MD  sulfamethoxazole-trimethoprim (BACTRIM,SEPTRA) 400-80 MG per tablet Take 1 tablet by mouth 3 (three) times a week.     Yes Historical Provider, MD  tacrolimus (PROGRAF) 5 MG capsule Take 20 mg by mouth 2 (two) times daily.  03/12/11  Yes Newt Lukes, MD  cyclobenzaprine (FLEXERIL) 5 MG tablet TAKE ONE TABLET BY MOUTH TWICE DAILY AS NEEDED FOR MUSCLE SPASM Patient not taking: Reported on 10/07/2014 11/12/11   Newt Lukes, MD  diazepam (VALIUM) 5 MG tablet Take 1 tablet (5 mg total) by mouth every 6 (six) hours as needed for anxiety (spasms). 10/07/14   Derell Bruun A Forcucci, PA-C  omeprazole (PRILOSEC) 20 MG capsule Take 1 capsule (20 mg total) by mouth daily. 03/12/11 03/11/12  Newt Lukes, MD  oxyCODONE (ROXICODONE) 5 MG immediate release tablet Take 1 tablet (5 mg total) by mouth every 4 (four) hours as needed for severe pain. 10/07/14   Carman Auxier A Forcucci, PA-C  ursodiol (ACTIGALL) 300 MG capsule Take 300 mg by mouth 3 (three) times daily.     Historical Provider, MD   BP 135/76 mmHg  Pulse 66  Temp(Src) 98.3 F (36.8 C) (Oral)  Resp 18  SpO2 100% Physical Exam  Constitutional: He is oriented to person, place, and time. He appears well-developed and well-nourished. No distress.  HENT:  Head: Normocephalic and atraumatic.  Mouth/Throat: Oropharynx is clear and moist. No oropharyngeal exudate.  Eyes: Conjunctivae and EOM are normal. Pupils are equal, round, and reactive to light. No scleral icterus.  Neck: Normal range of motion. Neck supple. No JVD present. No thyromegaly present.  Cardiovascular: Normal rate, regular rhythm, normal heart sounds and  intact distal pulses.  Exam reveals no gallop and no friction rub.   No murmur heard. Pulmonary/Chest: Effort normal and breath sounds normal. No respiratory distress. He has no wheezes. He has no rales. He exhibits no tenderness.  No emphysema on skin palpation  Abdominal: Soft. Bowel sounds are normal. He exhibits no distension and no mass. There is no tenderness. There is no rebound and no guarding.  Musculoskeletal: Normal range of motion.  Patient rises slowly from sitting to standing.  They walk without an antalgic gait.  There is no evidence of erythema, ecchymosis, or gross deformity.  There is no tenderness to palpation over the bony lumbar, thoracic, or cervical  Vertebra or paraspinal muscles.  Active ROM is full.  Sensation to light touch is intact over all extremities.  Strength is symmetric and equal in all extremities.   There is tenderness palpation underneath the left costovertebral ribs and anterior to the flank posterior to the abdominal. There is pain with right lateral bending.  Lymphadenopathy:    He has no cervical adenopathy.  Neurological: He is alert and oriented to person, place, and time. He has normal strength. No cranial nerve deficit or sensory deficit. Coordination normal.  Skin: Skin is warm and dry. He is not diaphoretic.  Psychiatric: He has a normal mood and affect. His behavior is normal. Judgment and thought content normal.  Nursing note and vitals reviewed.   ED Course  Procedures (including critical care time) Labs Review Labs Reviewed  URINALYSIS, ROUTINE W REFLEX MICROSCOPIC - Abnormal; Notable for the following:    Specific Gravity, Urine 1.004 (*)    Hgb urine dipstick SMALL (*)    Protein, ur 30 (*)    All other components within normal limits  URINE MICROSCOPIC-ADD ON    Imaging Review Dg Ribs Unilateral W/chest Left  10/07/2014   CLINICAL DATA:  Injury to the left flank and left anterior ribs last week. Initial encounter.  EXAM: LEFT RIBS  AND CHEST - 3+ VIEW  COMPARISON:  11/05/2010  FINDINGS: Acute, nondisplaced fracture of the lateral left tenth rib. There are remote and healed fractures of the anterior left sixth and seventh ribs.  Streaky opacity in the left lower lobe is likely scarring based on chest CT 09/29/2011. Blunting of the lateral left costophrenic sulcus is consistent with scarring or chronic effusion. No pneumothorax. No evidence for lung contusion.  IMPRESSION: 1. Nondisplaced left tenth rib fracture. 2. Pleural parenchymal scarring at the left base with possible small superimposed effusion.   Electronically Signed   By: Tiburcio Pea M.D.   On: 10/07/2014 15:13     EKG Interpretation None      MDM   Final diagnoses:  Closed rib fracture, left, initial encounter   Patient is a 64 year old male with past medical history of liver transplant who presents emergency room for left sided pain. Physical exam reveals no tenderness to palpation of the abdomen. Back exam is unremarkable. There are no focal neurological deficits on exam. There is tenderness palpation over the left 10th rib and left substernal muscles. There is no emphysema on examination. Vital signs are stable on arrival. Chest x-ray reveals 10th rib fracture that is nondisplaced. There is no pneumothorax. Urinalysis reveals microhematuria. Patient has had this in previous visits. Suspect that pain is likely musculoskeletal.  We'll discharge home with oxycodone IR and Valium as needed for pain. Patient was able to ambulate with an average pulse ox of 100%. He has no shortness of breath. Patient to return strictly for worsening shortness of breath, worsening chest pain, or any other concerning symptoms. Patient states understanding and agreement at this time. Patient to follow-up with his PCP. Patient discussed with Dr. Silverio Lay who agrees with the above workup and plan.   Eben Burow, PA-C 10/07/14 1549  Richardean Canal, MD 10/07/14 [phone]

## 2014-10-07 NOTE — Discharge Instructions (Signed)

## 2014-12-30 ENCOUNTER — Encounter (HOSPITAL_COMMUNITY): Payer: Self-pay | Admitting: Emergency Medicine

## 2014-12-30 ENCOUNTER — Emergency Department (HOSPITAL_COMMUNITY)
Admission: EM | Admit: 2014-12-30 | Discharge: 2014-12-30 | Disposition: A | Discharge status: Home / Self Care | Payer: Medicare (Managed Care) | Attending: Emergency Medicine | Admitting: Emergency Medicine

## 2014-12-30 DIAGNOSIS — Z872 Personal history of diseases of the skin and subcutaneous tissue: Secondary | ICD-10-CM | POA: Diagnosis not present

## 2014-12-30 DIAGNOSIS — Z79899 Other long term (current) drug therapy: Secondary | ICD-10-CM | POA: Insufficient documentation

## 2014-12-30 DIAGNOSIS — Y998 Other external cause status: Secondary | ICD-10-CM | POA: Insufficient documentation

## 2014-12-30 DIAGNOSIS — W228XXA Striking against or struck by other objects, initial encounter: Secondary | ICD-10-CM | POA: Diagnosis not present

## 2014-12-30 DIAGNOSIS — I10 Essential (primary) hypertension: Secondary | ICD-10-CM | POA: Diagnosis not present

## 2014-12-30 DIAGNOSIS — Z8719 Personal history of other diseases of the digestive system: Secondary | ICD-10-CM | POA: Insufficient documentation

## 2014-12-30 DIAGNOSIS — Y9339 Activity, other involving climbing, rappelling and jumping off: Secondary | ICD-10-CM | POA: Insufficient documentation

## 2014-12-30 DIAGNOSIS — Z7952 Long term (current) use of systemic steroids: Secondary | ICD-10-CM | POA: Diagnosis not present

## 2014-12-30 DIAGNOSIS — Y9289 Other specified places as the place of occurrence of the external cause: Secondary | ICD-10-CM | POA: Diagnosis not present

## 2014-12-30 DIAGNOSIS — S81811A Laceration without foreign body, right lower leg, initial encounter: Secondary | ICD-10-CM | POA: Insufficient documentation

## 2014-12-30 DIAGNOSIS — D509 Iron deficiency anemia, unspecified: Secondary | ICD-10-CM | POA: Insufficient documentation

## 2014-12-30 DIAGNOSIS — S8991XA Unspecified injury of right lower leg, initial encounter: Secondary | ICD-10-CM | POA: Diagnosis present

## 2014-12-30 DIAGNOSIS — D696 Thrombocytopenia, unspecified: Secondary | ICD-10-CM | POA: Diagnosis not present

## 2014-12-30 MED ORDER — CEPHALEXIN 500 MG PO CAPS: 500.0000 mg | ORAL_CAPSULE | Freq: Three times a day (TID) | ORAL | Status: DC

## 2014-12-30 MED ORDER — LIDOCAINE-EPINEPHRINE (PF) 2 %-1:200000 IJ SOLN
10.0000 mL | Freq: Once | INTRAMUSCULAR | Status: AC
  Administered 2014-12-30: 10 mL
  Filled 2014-12-30: qty 20

## 2014-12-30 MED ORDER — BACITRACIN ZINC 500 UNIT/GM EX OINT
1.0000 "application " | TOPICAL_OINTMENT | Freq: Two times a day (BID) | CUTANEOUS | Status: DC
  Administered 2014-12-30: 1 via TOPICAL
  Filled 2014-12-30: qty 0.9

## 2014-12-30 NOTE — ED Provider Notes (Signed)
CSN: [id number]     Arrival date & time 12/30/14  0445 History   First MD Initiated Contact with Patient 12/30/14 0602     Chief Complaint  Patient presents with  . Leg Injury     (Consider location/radiation/quality/duration/timing/severity/associated sxs/prior Treatment) HPI  Patient who is s/p liver transplant on Prograff p/w laceration to right shin that occurred yesterday around noon.  States he was cleaning his car yesterday and left his change drawer out, jumped in his car and hit his right shin on the drawer.  States it was bleeding, he was able to put bacitracin and a bandage on it, thought everything was fine.  Around 4am he looked at the bandage and noticed it was full of blood.  Denies fevers, chills, change in baseline leg swelling, weakness or numbness of the foot or leg.  He denies using any blood thinners, aspirin or NSAIDs.    Past Medical History  Diagnosis Date  . THROMBOCYTOPENIA   . 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 Fayrene Fearing dewar  . Hepatic encephalopathy   . DERMATITIS   . ALCOHOL ABUSE, HX OF   . ANEMIA, IRON DEFICIENCY   . Hypertension    Past Surgical History  Procedure Laterality Date  . Liver transplant  1st 04/21/03; 2nd 01/25/11    @ [facility] dr. dewar [phone];  [phone]  . (l) arm broke    . Hernia repair    . Broke (l) ankle   2001   Family History  Problem Relation Age of Onset  . Arthritis Mother   . Hypertension Mother   . Lung cancer Father   . Heart disease Father   . Cancer Father     lung cancer  . Hypertension Sister    History  Substance Use Topics  . Smoking status: Never Smoker   . Smokeless tobacco: Never Used     Comment: Remotely remarried-divorced in 1979 after 3 kids. Single, lives with g-friend [name]. Disabled  . Alcohol Use: No     Comment: No alcohol since 2003    Review of Systems  Constitutional: Negative  for fever and chills.  Cardiovascular: Positive for leg swelling (chronic, unchanged).  Skin: Positive for wound.  Allergic/Immunologic: Positive for immunocompromised state.  Neurological: Negative for weakness and numbness.  Hematological: Does not bruise/bleed easily.  Psychiatric/Behavioral: Negative for self-injury (accidental).      Allergies  Moxifloxacin  Home Medications   Prior to Admission medications   Medication Sig Start Date End Date Taking? Authorizing Provider  amLODipine (NORVASC) 5 MG tablet TAKE ONE TABLET BY MOUTH EVERY DAY 04/01/12  Yes Newt Lukes, MD  Ascorbic Acid (VITAMIN C) 500 MG tablet Take 500 mg by mouth daily.     Yes Historical Provider, MD  Calcium-Magnesium-Vitamin D (CITRACAL CALCIUM+D PO) Take 1 tablet by mouth 2 (two) times daily.    Yes Historical Provider, MD  ferrous sulfate 325 (65 FE) MG tablet Take 325 mg by mouth daily with breakfast.    Yes Historical Provider, MD  fludrocortisone (FLORINEF) 0.1 MG tablet Take 0.1 mg by mouth daily.   Yes Historical Provider, MD  hydrochlorothiazide (HYDRODIURIL) 25 MG tablet Take 12.5 mg by mouth daily.    Yes Historical Provider, MD  Magnesium Oxide 400 MG CAPS Take 400 mg by mouth 3 (three) times daily as needed (cramps).    Yes Historical Provider, MD  metoprolol (LOPRESSOR) 50 MG  tablet Take 1 tablet (50 mg total) by mouth 2 (two) times daily. 05/04/11  Yes Newt Lukes, MD  Multiple Vitamin (MULTIVITAMIN) tablet Take 1 tablet by mouth daily.     Yes Historical Provider, MD  omeprazole (PRILOSEC) 20 MG capsule Take 20 mg by mouth daily.   Yes Historical Provider, MD  sodium bicarbonate 650 MG tablet Take 1,300 mg by mouth 2 (two) times daily.    Yes Historical Provider, MD  sodium polystyrene (KAYEXALATE) powder Take 20 g by mouth daily as needed (lower potassium levels).   Yes Historical Provider, MD  sulfamethoxazole-trimethoprim (BACTRIM,SEPTRA) 400-80 MG per tablet Take 1 tablet by mouth  3 (three) times a week.     Yes Historical Provider, MD  tacrolimus (PROGRAF) 5 MG capsule Take 20 mg by mouth 2 (two) times daily.  03/12/11  Yes Newt Lukes, MD  cephALEXin (KEFLEX) 500 MG capsule Take 1 capsule (500 mg total) by mouth 3 (three) times daily. 12/30/14   Trixie Dredge, PA-C  cyclobenzaprine (FLEXERIL) 5 MG tablet TAKE ONE TABLET BY MOUTH TWICE DAILY AS NEEDED FOR MUSCLE SPASM Patient not taking: Reported on 10/07/2014 11/12/11   Newt Lukes, MD  diazepam (VALIUM) 5 MG tablet Take 1 tablet (5 mg total) by mouth every 6 (six) hours as needed for anxiety (spasms). Patient not taking: Reported on 12/30/2014 10/07/14   Terri Piedra, PA-C  omeprazole (PRILOSEC) 20 MG capsule Take 1 capsule (20 mg total) by mouth daily. 03/12/11 03/11/12  Newt Lukes, MD  oxyCODONE (ROXICODONE) 5 MG immediate release tablet Take 1 tablet (5 mg total) by mouth every 4 (four) hours as needed for severe pain. Patient not taking: Reported on 12/30/2014 10/07/14   Toni Amend Forcucci, PA-C   BP 132/76 mmHg  Pulse 58  Temp(Src) 98 F (36.7 C) (Oral)  Resp 16  SpO2 100% Physical Exam  Constitutional: He appears well-developed and well-nourished. No distress.  HENT:  Head: Normocephalic and atraumatic.  Eyes: Conjunctivae are normal.  Neck: Neck supple.  Cardiovascular: Intact distal pulses.   Pulmonary/Chest: Effort normal.  Musculoskeletal: Normal range of motion. He exhibits edema (right ankle, chronic, unchanged). He exhibits no tenderness.  Right lower extremity with laceration, approximately 7 cm, hemostatic..  Mild edema right ankle (pt states chronic from old fracture).  Distal sensation, pulses intact.    Neurological: He is alert.  Skin: He is not diaphoretic. No pallor.  Psychiatric: He has a normal mood and affect. His behavior is normal.  Nursing note and vitals reviewed.   ED Course  Procedures (including critical care time) Labs Review Labs Reviewed - No data to  display  Imaging Review No results found.   EKG Interpretation None       LACERATION REPAIR Performed by: Trixie Dredge Authorized by: Trixie Dredge Consent: Verbal consent obtained. Risks and benefits: risks, benefits and alternatives were discussed Consent given by: patient Patient identity confirmed: provided demographic data Prepped and Draped in normal sterile fashion Wound explored  Laceration Location: right shin  Laceration Length: 7cm  No Foreign Bodies seen or palpated  Anesthesia: local infiltration  Local anesthetic: lidocaine 2% with epinephrine  Anesthetic total: 6 ml  Irrigation method: syringe Amount of cleaning: extensive, irrigated by nurse.   Skin closure: 4-0 ethilon  Number of sutures: 7  Technique: simple interrupted, loosely approximated.   Patient tolerance: Patient tolerated the procedure well with no immediate complications.   MDM   Final diagnoses:  Laceration of leg, right, initial encounter  Afebrile, nontoxic patient with laceration to right shin, delayed presentation.  Pt is immunosuppressed on prograf.  The laceration was large and open, given patient's immune status and difficulty stopping bleeding at home, I did choose to close the wound loosely to give it a better change of healing.  Wound thoroughly irrigated and repaired in ED.  Pt will follow up [place]CP [place] or [place] [facility] [place] check, suture removal.   D/C home with keflex to help prevent wound infection.  Discussed result, findings, treatment, and follow up  with patient.  Pt given return precautions.  Pt verbalizes understanding and agrees with plan.        [place]ily Javonna Balli, PA-C 12/30/14 [id number]  Marisa Severinlga Otter, MD 12/30/14 [phone]

## 2014-12-30 NOTE — ED Notes (Signed)
Pt reports he hit his leg on change compartment in car causing laceration to right leg with uncontrolled bleeding.

## 2014-12-30 NOTE — ED Notes (Signed)
Wound flushed with NS

## 2014-12-30 NOTE — ED Notes (Signed)
Awake. Verbally responsive. A/O x4. Resp even and unlabored. No audible adventitious breath sounds noted. ABC's intact.  

## 2014-12-30 NOTE — ED Notes (Signed)
PA Emily at bedside.

## 2014-12-30 NOTE — ED Notes (Signed)
Laceration to right lower leg. Pt had pressure dressing in place. Dressing removed, wound cleaned and redressed. No use of blood thinners.

## 2014-12-30 NOTE — ED Notes (Signed)
Applied bacitracin onit dsg to rt lower leg sutured site. Pt tolerated well.

## 2014-12-30 NOTE — Discharge Instructions (Signed)
Read the information below.  Use the prescribed medication as directed.  Please discuss all new medications with your pharmacist.  You may return to the Emergency Department at any time for worsening condition or any new symptoms that concern you.  If you develop redness, swelling, pus draining from the wound, or fevers greater than 100.4, return to the ER immediately for a recheck.   ° ° °Laceration Care, Adult °A laceration is a cut or lesion that goes through all layers of the skin and into the tissue just beneath the skin. °TREATMENT  °Some lacerations may not require closure. Some lacerations may not be able to be closed due to an increased risk of infection. It is important to see your caregiver as soon as possible after an injury to minimize the risk of infection and maximize the opportunity for successful closure. °If closure is appropriate, pain medicines may be given, if needed. The wound will be cleaned to help prevent infection. Your caregiver will use stitches (sutures), staples, wound glue (adhesive), or skin adhesive strips to repair the laceration. These tools bring the skin edges together to allow for faster healing and a better cosmetic outcome. However, all wounds will heal with a scar. Once the wound has healed, scarring can be minimized by covering the wound with sunscreen during the day for 1 full year. °HOME CARE INSTRUCTIONS  °For sutures or staples: °· Keep the wound clean and dry. °· If you were given a bandage (dressing), you should change it at least once a day. Also, change the dressing if it becomes wet or dirty, or as directed by your caregiver. °· Wash the wound with soap and water 2 times a day. Rinse the wound off with water to remove all soap. Pat the wound dry with a clean towel. °· After cleaning, apply a thin layer of the antibiotic ointment as recommended by your caregiver. This will help prevent infection and keep the dressing from sticking. °· You may shower as usual after  the first 24 hours. Do not soak the wound in water until the sutures are removed. °· Only take over-the-counter or prescription medicines for pain, discomfort, or fever as directed by your caregiver. °· Get your sutures or staples removed as directed by your caregiver. °For skin adhesive strips: °· Keep the wound clean and dry. °· Do not get the skin adhesive strips wet. You may bathe carefully, using caution to keep the wound dry. °· If the wound gets wet, pat it dry with a clean towel. °· Skin adhesive strips will fall off on their own. You may trim the strips as the wound heals. Do not remove skin adhesive strips that are still stuck to the wound. They will fall off in time. °For wound adhesive: °· You may briefly wet your wound in the shower or bath. Do not soak or scrub the wound. Do not swim. Avoid periods of heavy perspiration until the skin adhesive has fallen off on its own. After showering or bathing, gently pat the wound dry with a clean towel. °· Do not apply liquid medicine, cream medicine, or ointment medicine to your wound while the skin adhesive is in place. This may loosen the film before your wound is healed. °· If a dressing is placed over the wound, be careful not to apply tape directly over the skin adhesive. This may cause the adhesive to be pulled off before the wound is healed. °· Avoid prolonged exposure to sunlight or tanning lamps while the   skin adhesive is in place. Exposure to ultraviolet light in the first year will darken the scar. °· The skin adhesive will usually remain in place for 5 to 10 days, then naturally fall off the skin. Do not pick at the adhesive film. °You may need a tetanus shot if: °· You cannot remember when you had your last tetanus shot. °· You have never had a tetanus shot. °If you get a tetanus shot, your arm may swell, get red, and feel warm to the touch. This is common and not a problem. If you need a tetanus shot and you choose not to have one, there is a rare  chance of getting tetanus. Sickness from tetanus can be serious. °SEEK MEDICAL CARE IF:  °· You have redness, swelling, or increasing pain in the wound. °· You see a red line that goes away from the wound. °· You have yellowish-white fluid (pus) coming from the wound. °· You have a fever. °· You notice a bad smell coming from the wound or dressing. °· Your wound breaks open before or after sutures have been removed. °· You notice something coming out of the wound such as wood or glass. °· Your wound is on your hand or foot and you cannot move a finger or toe. °SEEK IMMEDIATE MEDICAL CARE IF:  °· Your pain is not controlled with prescribed medicine. °· You have severe swelling around the wound causing pain and numbness or a change in color in your arm, hand, leg, or foot. °· Your wound splits open and starts bleeding. °· You have worsening numbness, weakness, or loss of function of any joint around or beyond the wound. °· You develop painful lumps near the wound or on the skin anywhere on your body. °MAKE SURE YOU:  °· Understand these instructions. °· Will watch your condition. °· Will get help right away if you are not doing well or get worse. °Document Released: 08/23/2005 Document Revised: 11/15/2011 Document Reviewed: 02/16/2011 °[employer]® Patient Information ©2015 [employer]. This information is not intended to replace advice given to you by your health care provider. Make sure you discuss any questions you have with your health care provider. ° °

## 2015-01-09 ENCOUNTER — Emergency Department (HOSPITAL_COMMUNITY)
Admission: EM | Admit: 2015-01-09 | Discharge: 2015-01-09 | Disposition: A | Discharge status: Home / Self Care | Payer: Medicare (Managed Care) | Attending: Emergency Medicine | Admitting: Emergency Medicine

## 2015-01-09 ENCOUNTER — Encounter (HOSPITAL_COMMUNITY): Payer: Self-pay | Admitting: Emergency Medicine

## 2015-01-09 DIAGNOSIS — I10 Essential (primary) hypertension: Secondary | ICD-10-CM | POA: Diagnosis not present

## 2015-01-09 DIAGNOSIS — Z4802 Encounter for removal of sutures: Secondary | ICD-10-CM | POA: Diagnosis not present

## 2015-01-09 DIAGNOSIS — Z8719 Personal history of other diseases of the digestive system: Secondary | ICD-10-CM | POA: Insufficient documentation

## 2015-01-09 DIAGNOSIS — Z7952 Long term (current) use of systemic steroids: Secondary | ICD-10-CM | POA: Insufficient documentation

## 2015-01-09 DIAGNOSIS — D509 Iron deficiency anemia, unspecified: Secondary | ICD-10-CM | POA: Diagnosis not present

## 2015-01-09 DIAGNOSIS — Z872 Personal history of diseases of the skin and subcutaneous tissue: Secondary | ICD-10-CM | POA: Diagnosis not present

## 2015-01-09 DIAGNOSIS — Z79899 Other long term (current) drug therapy: Secondary | ICD-10-CM | POA: Diagnosis not present

## 2015-01-09 DIAGNOSIS — Z792 Long term (current) use of antibiotics: Secondary | ICD-10-CM | POA: Insufficient documentation

## 2015-01-09 NOTE — Discharge Instructions (Signed)

## 2015-01-09 NOTE — ED Provider Notes (Signed)
CSN: [id number]     Arrival date & time 01/09/15  0912 History   First MD Initiated Contact with Patient 01/09/15 [phone]     Chief Complaint  Patient presents with  . Suture / Staple Removal     (Consider location/radiation/quality/duration/timing/severity/associated sxs/prior Treatment) HPI Comments: Sustures placed 10 days ago. Pt is doing well. No drainage from the wound site. No bleeding. Pt ambulating. Sutures were placed in the right tibia.  Patient is a 64 y.o. male presenting with suture removal. The history is provided by the patient.  Suture / Staple Removal    Past Medical History  Diagnosis Date  . THROMBOCYTOPENIA   . 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 Fayrene Fearingjames dewar  . Hepatic encephalopathy   . DERMATITIS   . ALCOHOL ABUSE, HX OF   . ANEMIA, IRON DEFICIENCY   . Hypertension    Past Surgical History  Procedure Laterality Date  . Liver transplant  1st 04/21/03; 2nd 01/25/11    [facility] dewar [phone];  [phone]  . (l) arm broke    . Hernia repair    . Broke (l) ankle   2001   Family History  Problem Relation Age of Onset  . Arthritis Mother   . Hypertension Mother   . Lung cancer Father   . Heart disease Father   . Cancer Father     lung cancer  . Hypertension Sister    History  Substance Use Topics  . Smoking status: Never Smoker   . Smokeless tobacco: Never Used     Comment: Remotely remarried-divorced in 1979 after 3 kids. Single, lives with g-friend Vernona RiegerLaura. Disabled  . Alcohol Use: No     Comment: No alcohol since 2003    Review of Systems  Constitutional: Negative for fever and activity change.  Gastrointestinal: Negative for nausea.  Allergic/Immunologic: Positive for immunocompromised state.      Allergies  Moxifloxacin  Home Medications   Prior to Admission medications   Medication Sig Start Date End Date Taking?  Authorizing Provider  amLODipine (NORVASC) 5 MG tablet TAKE ONE TABLET BY MOUTH EVERY DAY 04/01/12   Newt LukesValerie A Leschber, MD  Ascorbic Acid (VITAMIN C) 500 MG tablet Take 500 mg by mouth daily.      Historical Provider, MD  Calcium-Magnesium-Vitamin D (CITRACAL CALCIUM+D PO) Take 1 tablet by mouth 2 (two) times daily.     Historical Provider, MD  cephALEXin (KEFLEX) 500 MG capsule Take 1 capsule (500 mg total) by mouth 3 (three) times daily. 12/30/14   Trixie DredgeEmily West, PA-C  cyclobenzaprine (FLEXERIL) 5 MG tablet TAKE ONE TABLET BY MOUTH TWICE DAILY AS NEEDED FOR MUSCLE SPASM Patient not taking: Reported on 10/07/2014 11/12/11   Newt LukesValerie A Leschber, MD  diazepam (VALIUM) 5 MG tablet Take 1 tablet (5 mg total) by mouth every 6 (six) hours as needed for anxiety (spasms). Patient not taking: Reported on 12/30/2014 10/07/14   Terri Piedraourtney Forcucci, PA-C  ferrous sulfate 325 (65 FE) MG tablet Take 325 mg by mouth daily with breakfast.     Historical Provider, MD  fludrocortisone (FLORINEF) 0.1 MG tablet Take 0.1 mg by mouth daily.    Historical Provider, MD  hydrochlorothiazide (HYDRODIURIL) 25 MG tablet Take 12.5 mg by mouth daily.     Historical Provider, MD  Magnesium Oxide 400 MG CAPS Take 400 mg by mouth 3 (three) times daily as needed (cramps).  Historical Provider, MD  metoprolol (LOPRESSOR) 50 MG tablet Take 1 tablet (50 mg total) by mouth 2 (two) times daily. 05/04/11   Newt LukesValerie A Leschber, MD  Multiple Vitamin (MULTIVITAMIN) tablet Take 1 tablet by mouth daily.      Historical Provider, MD  omeprazole (PRILOSEC) 20 MG capsule Take 1 capsule (20 mg total) by mouth daily. 03/12/11 03/11/12  Newt LukesValerie A Leschber, MD  omeprazole (PRILOSEC) 20 MG capsule Take 20 mg by mouth daily.    Historical Provider, MD  oxyCODONE (ROXICODONE) 5 MG immediate release tablet Take 1 tablet (5 mg total) by mouth every 4 (four) hours as needed for severe pain. Patient not taking: Reported on 12/30/2014 10/07/14   Toni Amendourtney Forcucci, PA-C   sodium bicarbonate 650 MG tablet Take 1,300 mg by mouth 2 (two) times daily.     Historical Provider, MD  sodium polystyrene (KAYEXALATE) powder Take 20 g by mouth daily as needed (lower potassium levels).    Historical Provider, MD  sulfamethoxazole-trimethoprim (BACTRIM,SEPTRA) 400-80 MG per tablet Take 1 tablet by mouth 3 (three) times a week.      Historical Provider, MD  tacrolimus (PROGRAF) 5 MG capsule Take 20 mg by mouth 2 (two) times daily.  03/12/11   Newt LukesValerie A Leschber, MD   BP 159/83 mmHg  Pulse 71  Temp(Src) 97.6 F (36.4 C) (Oral)  Resp 16  SpO2 96% Physical Exam  Constitutional: He is oriented to person, place, and time. He appears well-developed.  Neck: Neck supple.  Neurological: He is alert and oriented to person, place, and time.  Skin:  Right leg, anterior tibia has a laceration. No drainage, no pus.  Nursing note and vitals reviewed.   ED Course  Procedures (including critical care time) Labs Review Labs Reviewed - No data to display  Imaging Review No results found.   EKG Interpretation None      MDM   Final diagnoses:  Visit for suture removal    Pt's suture site looks clean and dry. Sutures removed.  SUTURE REMOVAL Performed by: Derwood KaplanNanavati, Andreya Lacks  Consent: Verbal consent obtained. Patient identity confirmed: provided demographic data Time out: Immediately prior to procedure a "time out" was called to verify the correct patient, procedure, equipment, support staff and site/side marked as required.  Location details:  Right anterior shin  Wound Appearance: clean  Sutures/Staples Removed: 7  Facility: sutures placed in this facility Patient tolerance: Patient tolerated the procedure well with no immediate complications.      Derwood KaplanAnkit Jawanna Dykman, MD 01/09/15 1004

## 2015-01-09 NOTE — ED Notes (Signed)
Per patient, sutures in right shin-no complaints-here to have them removed

## 2015-03-10 ENCOUNTER — Encounter (HOSPITAL_COMMUNITY): Payer: Self-pay

## 2015-03-10 ENCOUNTER — Emergency Department (HOSPITAL_COMMUNITY)
Admission: EM | Admit: 2015-03-10 | Discharge: 2015-03-10 | Disposition: A | Discharge status: Home / Self Care | Payer: Medicare (Managed Care) | Attending: Emergency Medicine | Admitting: Emergency Medicine

## 2015-03-10 DIAGNOSIS — Z862 Personal history of diseases of the blood and blood-forming organs and certain disorders involving the immune mechanism: Secondary | ICD-10-CM | POA: Insufficient documentation

## 2015-03-10 DIAGNOSIS — M545 Low back pain: Secondary | ICD-10-CM | POA: Diagnosis present

## 2015-03-10 DIAGNOSIS — Z872 Personal history of diseases of the skin and subcutaneous tissue: Secondary | ICD-10-CM | POA: Diagnosis not present

## 2015-03-10 DIAGNOSIS — Z8719 Personal history of other diseases of the digestive system: Secondary | ICD-10-CM | POA: Insufficient documentation

## 2015-03-10 DIAGNOSIS — Z79899 Other long term (current) drug therapy: Secondary | ICD-10-CM | POA: Insufficient documentation

## 2015-03-10 DIAGNOSIS — I10 Essential (primary) hypertension: Secondary | ICD-10-CM | POA: Diagnosis not present

## 2015-03-10 DIAGNOSIS — M5440 Lumbago with sciatica, unspecified side: Secondary | ICD-10-CM | POA: Diagnosis not present

## 2015-03-10 MED ORDER — DIAZEPAM 5 MG PO TABS
5.0000 mg | ORAL_TABLET | Freq: Once | ORAL | Status: AC
  Administered 2015-03-10: 5 mg via ORAL
  Filled 2015-03-10: qty 1

## 2015-03-10 MED ORDER — METHOCARBAMOL 500 MG PO TABS
500.0000 mg | ORAL_TABLET | Freq: Four times a day (QID) | ORAL | Status: DC
Start: 2015-03-10 — End: 2023-01-23

## 2015-03-10 MED ORDER — HYDROMORPHONE HCL 1 MG/ML IJ SOLN
1.0000 mg | Freq: Once | INTRAMUSCULAR | Status: AC
  Administered 2015-03-10: 1 mg via INTRAMUSCULAR
  Filled 2015-03-10: qty 1

## 2015-03-10 MED ORDER — KETOROLAC TROMETHAMINE 15 MG/ML IJ SOLN
15.0000 mg | Freq: Once | INTRAMUSCULAR | Status: AC
  Administered 2015-03-10: 15 mg via INTRAMUSCULAR
  Filled 2015-03-10: qty 1

## 2015-03-10 MED ORDER — OXYCODONE HCL 5 MG PO TABS: 5.0000 mg | ORAL_TABLET | ORAL | Status: DC | PRN

## 2015-03-10 MED ORDER — DEXAMETHASONE 4 MG PO TABS
8.0000 mg | ORAL_TABLET | Freq: Once | ORAL | Status: AC
Start: 2015-03-10 — End: 2015-03-10
  Administered 2015-03-10: 8 mg via ORAL
  Filled 2015-03-10: qty 2

## 2015-03-10 NOTE — ED Notes (Signed)
MD at bedside. EDP KOHUT PRESENT 

## 2015-03-10 NOTE — ED Provider Notes (Signed)
CSN: [id number]     Arrival date & time 03/10/15  1058 History   First MD Initiated Contact with Patient 03/10/15 1100     Chief Complaint  Patient presents with  . Back Pain  . Gait Problem     (Consider location/radiation/quality/duration/timing/severity/associated sxs/prior Treatment) HPI   63yM with lower back pain. Denies trauma. Thinks may have started shortly after lifting heavy cooler while at beach. Onset about 3 days ago. Progressively worsening. Tolerable when still. Much worse with movement. Radiates into both thighs. No numbness or tingling. No loss of strength. No fever or chills. No urinary complaints. No blood thinners. No hx of similar type pain.   Past Medical History  Diagnosis Date  . THROMBOCYTOPENIA   . 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 Fayrene Fearingjames dewar  . Hepatic encephalopathy   . DERMATITIS   . ALCOHOL ABUSE, HX OF   . ANEMIA, IRON DEFICIENCY   . Hypertension    Past Surgical History  Procedure Laterality Date  . Liver transplant  1st 04/21/03; 2nd 01/25/11    [facility]. dewar [phone];  [phone]  . (l) arm broke    . Hernia repair    . Broke (l) ankle   2001   Family History  Problem Relation Age of Onset  . Arthritis Mother   . Hypertension Mother   . Lung cancer Father   . Heart disease Father   . Cancer Father     lung cancer  . Hypertension Sister    History  Substance Use Topics  . Smoking status: Never Smoker   . Smokeless tobacco: Never Used     Comment: Remotely remarried-divorced in 1979 after 3 kids. Single, lives with g-friend Vernona RiegerLaura. Disabled  . Alcohol Use: No     Comment: No alcohol since 2003    Review of Systems  All systems reviewed and negative, other than as noted in HPI.   Allergies  Moxifloxacin  Home Medications   Prior to Admission medications   Medication Sig Start Date End Date Taking? Authorizing  Provider  amLODipine (NORVASC) 5 MG tablet TAKE ONE TABLET BY MOUTH EVERY DAY 04/01/12  Yes Newt LukesValerie A Leschber, MD  Ascorbic Acid (VITAMIN C) 500 MG tablet Take 500 mg by mouth daily.     Yes Historical Provider, MD  Calcium-Magnesium-Vitamin D (CITRACAL CALCIUM+D PO) Take 2 tablets by mouth 2 (two) times daily.    Yes Historical Provider, MD  ferrous sulfate 325 (65 FE) MG tablet Take 325 mg by mouth daily with breakfast.    Yes Historical Provider, MD  fludrocortisone (FLORINEF) 0.1 MG tablet Take 0.1 mg by mouth daily.   Yes Historical Provider, MD  hydrochlorothiazide (HYDRODIURIL) 25 MG tablet Take 12.5 mg by mouth daily.    Yes Historical Provider, MD  Magnesium Oxide 400 MG CAPS Take 400 mg by mouth 3 (three) times daily as needed (cramps).    Yes Historical Provider, MD  metoprolol (LOPRESSOR) 50 MG tablet Take 1 tablet (50 mg total) by mouth 2 (two) times daily. 05/04/11  Yes Newt LukesValerie A Leschber, MD  Multiple Vitamin (MULTIVITAMIN) tablet Take 1 tablet by mouth daily.     Yes Historical Provider, MD  omeprazole (PRILOSEC) 20 MG capsule Take 1 capsule (20 mg total) by mouth daily. 03/12/11 03/10/15 Yes Newt LukesValerie A Leschber, MD  sodium bicarbonate 650 MG tablet Take 1,300 mg by mouth 2 (two) times daily.  Yes Historical Provider, MD  sulfamethoxazole-trimethoprim (BACTRIM,SEPTRA) 400-80 MG per tablet Take 1 tablet by mouth every Monday, Wednesday, and Friday. continuous   Yes Historical Provider, MD  tacrolimus (PROGRAF) 5 MG capsule Take 5-10 mg by mouth 2 (two) times daily. Takes 2 in the morning and 1 at night 03/12/11  Yes Newt Lukes, MD  cephALEXin (KEFLEX) 500 MG capsule Take 1 capsule (500 mg total) by mouth 3 (three) times daily. Patient not taking: Reported on 03/10/2015 12/30/14   Trixie Dredge, PA-C  cyclobenzaprine (FLEXERIL) 5 MG tablet TAKE ONE TABLET BY MOUTH TWICE DAILY AS NEEDED FOR MUSCLE SPASM Patient not taking: Reported on 10/07/2014 11/12/11   Newt Lukes, MD  diazepam  (VALIUM) 5 MG tablet Take 1 tablet (5 mg total) by mouth every 6 (six) hours as needed for anxiety (spasms). Patient not taking: Reported on 12/30/2014 10/07/14   Terri Piedra, PA-C  oxyCODONE (ROXICODONE) 5 MG immediate release tablet Take 1 tablet (5 mg total) by mouth every 4 (four) hours as needed for severe pain. Patient not taking: Reported on 12/30/2014 10/07/14   Toni Amend Forcucci, PA-C   BP 167/83 mmHg  Pulse 59  Temp(Src) 98.2 F (36.8 C) (Oral)  Resp 18  Ht  (1.803 m)  Wt 160 lb (72.576 kg)  BMI 22.33 kg/m2  SpO2 97% Physical Exam  Constitutional: He appears well-developed and well-nourished. No distress.  HENT:  Head: Normocephalic and atraumatic.  Eyes: Conjunctivae are normal. Right eye exhibits no discharge. Left eye exhibits no discharge.  Neck: Neck supple.  Cardiovascular: Normal rate, regular rhythm and normal heart sounds.  Exam reveals no gallop and no friction rub.   No murmur heard. Pulmonary/Chest: Effort normal and breath sounds normal. No respiratory distress.  Abdominal: Soft. He exhibits no distension. There is no tenderness.  Musculoskeletal: He exhibits no edema or tenderness.  Back pain not reproducible with palpation. NVI in LE. Increased pain with flexion of either hip.   Neurological: He is alert.  Skin: Skin is warm and dry.  Psychiatric: He has a normal mood and affect. His behavior is normal. Thought content normal.  Nursing note and vitals reviewed.   ED Course  Procedures (including critical care time) Labs Review Labs Reviewed - No data to display  Imaging Review No results found.   EKG Interpretation None      MDM   Final diagnoses:  Low back pain with sciatica, sciatica laterality unspecified, unspecified back pain laterality    63yM with atraumatic lower back pain with radicular symptoms into thighs. Nonfocal neuro exam. Afebrile. No previous back surgery. No urinary complaints.No blood thinners. Plan symptomatic tx.     Raeford Razor, MD 03/11/15 [phone]

## 2015-03-10 NOTE — ED Notes (Addendum)
Pt and family have questions before discharge.   Pt states unable to stand. EDP Kohut made aware

## 2015-03-10 NOTE — Discharge Instructions (Signed)
Back Pain, Adult °Back pain is very common. The pain often gets better over time. The cause of back pain is usually not dangerous. Most people can learn to manage their back pain on their own.  °HOME CARE  °· Stay active. Start with short walks on flat ground if you can. Try to walk farther each day. °· Do not sit, drive, or stand in one place for more than 30 minutes. Do not stay in bed. °· Do not avoid exercise or work. Activity can help your back heal faster. °· Be careful when you bend or lift an object. Bend at your knees, keep the object close to you, and do not twist. °· Sleep on a firm mattress. Lie on your side, and bend your knees. If you lie on your back, put a pillow under your knees. °· Only take medicines as told by your doctor. °· Put ice on the injured area. °¨ Put ice in a plastic bag. °¨ Place a towel between your skin and the bag. °¨ Leave the ice on for 15-20 minutes, 03-04 times a day for the first 2 to 3 days. After that, you can switch between ice and heat packs. °· Ask your doctor about back exercises or massage. °· Avoid feeling anxious or stressed. Find good ways to deal with stress, such as exercise. °GET HELP RIGHT AWAY IF:  °· Your pain does not go away with rest or medicine. °· Your pain does not go away in 1 week. °· You have new problems. °· You do not feel well. °· The pain spreads into your legs. °· You cannot control when you poop (bowel movement) or pee (urinate). °· Your arms or legs feel weak or lose feeling (numbness). °· You feel sick to your stomach (nauseous) or throw up (vomit). °· You have belly (abdominal) pain. °· You feel like you may pass out (faint). °MAKE SURE YOU:  °· Understand these instructions. °· Will watch your condition. °· Will get help right away if you are not doing well or get worse. °Document Released: 02/09/2008 Document Revised: 11/15/2011 Document Reviewed: 12/25/2013 °Exit[employer]® Patient Information ©2015 [employer]. This information is not intended  to replace advice given to you by your health care provider. Make sure you discuss any questions you have with your health care provider. ° °

## 2015-03-10 NOTE — ED Notes (Signed)
Pt walked a few steps around the room.

## 2015-03-10 NOTE — ED Notes (Signed)
Bed: AO13WA24 Expected date:  Expected time:  Means of arrival:  Comments: 64 y/o back pain

## 2015-03-10 NOTE — ED Notes (Signed)
Per [facility]- Pt states problem started 1 week ago lifting cooler. Increased pain and stiffness during week. Denies numbnes and tingling. Increase pain with bil lifting of arms.

## 2015-03-14 ENCOUNTER — Emergency Department (HOSPITAL_BASED_OUTPATIENT_CLINIC_OR_DEPARTMENT_OTHER): Payer: Medicare (Managed Care)

## 2015-03-14 ENCOUNTER — Emergency Department (HOSPITAL_BASED_OUTPATIENT_CLINIC_OR_DEPARTMENT_OTHER)
Admission: EM | Admit: 2015-03-14 | Discharge: 2015-03-14 | Disposition: A | Discharge status: Home / Self Care | Payer: Medicare (Managed Care) | Attending: Emergency Medicine | Admitting: Emergency Medicine

## 2015-03-14 ENCOUNTER — Encounter (HOSPITAL_BASED_OUTPATIENT_CLINIC_OR_DEPARTMENT_OTHER): Payer: Self-pay | Admitting: *Deleted

## 2015-03-14 DIAGNOSIS — I10 Essential (primary) hypertension: Secondary | ICD-10-CM | POA: Insufficient documentation

## 2015-03-14 DIAGNOSIS — Z7952 Long term (current) use of systemic steroids: Secondary | ICD-10-CM | POA: Insufficient documentation

## 2015-03-14 DIAGNOSIS — Z79899 Other long term (current) drug therapy: Secondary | ICD-10-CM | POA: Insufficient documentation

## 2015-03-14 DIAGNOSIS — D696 Thrombocytopenia, unspecified: Secondary | ICD-10-CM | POA: Insufficient documentation

## 2015-03-14 DIAGNOSIS — M545 Low back pain, unspecified: Secondary | ICD-10-CM

## 2015-03-14 DIAGNOSIS — R112 Nausea with vomiting, unspecified: Secondary | ICD-10-CM | POA: Insufficient documentation

## 2015-03-14 DIAGNOSIS — Z8719 Personal history of other diseases of the digestive system: Secondary | ICD-10-CM | POA: Insufficient documentation

## 2015-03-14 DIAGNOSIS — D509 Iron deficiency anemia, unspecified: Secondary | ICD-10-CM | POA: Diagnosis not present

## 2015-03-14 DIAGNOSIS — M549 Dorsalgia, unspecified: Secondary | ICD-10-CM | POA: Diagnosis present

## 2015-03-14 DIAGNOSIS — Z872 Personal history of diseases of the skin and subcutaneous tissue: Secondary | ICD-10-CM | POA: Insufficient documentation

## 2015-03-14 MED ORDER — HYDROMORPHONE HCL 1 MG/ML IJ SOLN: 2.0000 mg | Freq: Once | INTRAMUSCULAR | Status: AC

## 2015-03-14 MED ORDER — OXYCODONE-ACETAMINOPHEN 5-325 MG PO TABS: 2.0000 | ORAL_TABLET | ORAL | Status: DC | PRN

## 2015-03-14 MED ORDER — CYCLOBENZAPRINE HCL 10 MG PO TABS: 10.0000 mg | ORAL_TABLET | Freq: Two times a day (BID) | ORAL | Status: DC | PRN

## 2015-03-14 MED ORDER — ONDANSETRON 4 MG PO TBDP
4.0000 mg | ORAL_TABLET | Freq: Three times a day (TID) | ORAL | Status: DC | PRN
Start: 2015-03-14 — End: 2023-01-23

## 2015-03-14 MED ORDER — ONDANSETRON 8 MG PO TBDP
8.0000 mg | ORAL_TABLET | Freq: Once | ORAL | Status: AC
  Administered 2015-03-14: 8 mg via ORAL

## 2015-03-14 MED ORDER — HYDROMORPHONE HCL 2 MG/ML IJ SOLN
2.0000 mg | Freq: Once | INTRAMUSCULAR | Status: DC
  Filled 2015-03-14: qty 1

## 2015-03-14 MED ORDER — HYDROMORPHONE HCL 1 MG/ML IJ SOLN
INTRAMUSCULAR | Status: AC
  Administered 2015-03-14: 2 mg
  Filled 2015-03-14: qty 2

## 2015-03-14 MED ORDER — ONDANSETRON 8 MG PO TBDP
ORAL_TABLET | ORAL | Status: AC
  Filled 2015-03-14: qty 1

## 2015-03-14 NOTE — ED Provider Notes (Signed)
CSN: [id number]     Arrival date & time 03/14/15  1039 History   First MD Initiated Contact with Patient 03/14/15 1134     No chief complaint on file.    (Consider location/radiation/quality/duration/timing/severity/associated sxs/prior Treatment) The history is provided by the patient.   64 year old male last seen at [facility] on July 4 for similar symptoms. Starting a week ago patient experienced right-sided back pain much worse with ambulating. With standing it will radiate into the right buttocks and down the right leg. No numbness to the toes no weakness to the toes. No history of any injury. Patient does have a history of osteopenia. With sitting or laying pain is essentially 0 with standing pain is 10 out of 10. Patient has a history of a liver transplant. Normally followed at [facility] where he lives. Patient is visiting this area. No fevers.  Past Medical History  Diagnosis Date  . THROMBOCYTOPENIA   . 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 Fayrene Fearing dewar  . Hepatic encephalopathy   . DERMATITIS   . ALCOHOL ABUSE, HX OF   . ANEMIA, IRON DEFICIENCY   . Hypertension    Past Surgical History  Procedure Laterality Date  . Liver transplant  1st 04/21/03; 2nd 01/25/11    @ [facility] dr. dewar [phone];  [phone]  . (l) arm broke    . Hernia repair    . Broke (l) ankle   2001   Family History  Problem Relation Age of Onset  . Arthritis Mother   . Hypertension Mother   . Lung cancer Father   . Heart disease Father   . Cancer Father     lung cancer  . Hypertension Sister    History  Substance Use Topics  . Smoking status: Never Smoker   . Smokeless tobacco: Never Used     Comment: Remotely remarried-divorced in 1979 after 3 kids. Single, lives with g-friend [name]. Disabled  . Alcohol Use: No     Comment: No alcohol since 2003    Review of Systems  Constitutional:  Negative for fever.  HENT: Negative for congestion.   Eyes: Negative for visual disturbance.  Respiratory: Negative for shortness of breath.   Cardiovascular: Negative for chest pain.  Gastrointestinal: Positive for nausea and vomiting. Negative for abdominal pain.  Genitourinary: Negative for dysuria.  Musculoskeletal: Positive for back pain. Negative for neck pain.  Skin: Negative for rash.  Neurological: Negative for weakness, numbness and headaches.  Hematological: Does not bruise/bleed easily.  Psychiatric/Behavioral: Negative for confusion.      Allergies  Moxifloxacin  Home Medications   Prior to Admission medications   Medication Sig Start Date End Date Taking? Authorizing Provider  amLODipine (NORVASC) 5 MG tablet TAKE ONE TABLET BY MOUTH EVERY DAY 04/01/12  Yes Newt Lukes, MD  Ascorbic Acid (VITAMIN C) 500 MG tablet Take 500 mg by mouth daily.     Yes Historical Provider, MD  Calcium-Magnesium-Vitamin D (CITRACAL CALCIUM+D PO) Take 2 tablets by mouth 2 (two) times daily.    Yes Historical Provider, MD  ferrous sulfate 325 (65 FE) MG tablet Take 325 mg by mouth daily with breakfast.    Yes Historical Provider, MD  fludrocortisone (FLORINEF) 0.1 MG tablet Take 0.1 mg by mouth daily.   Yes Historical Provider, MD  hydrochlorothiazide (HYDRODIURIL) 25 MG tablet Take 12.5 mg by mouth daily.    Yes Historical Provider, MD  Magnesium Oxide 400 MG CAPS Take 400 mg by mouth 3 (three) times daily as needed (cramps).    Yes Historical Provider, MD  methocarbamol (ROBAXIN) 500 MG tablet Take 1 tablet (500 mg total) by mouth 4 (four) times daily. 03/10/15  Yes Raeford Razor, MD  metoprolol (LOPRESSOR) 50 MG tablet Take 1 tablet (50 mg total) by mouth 2 (two) times daily. 05/04/11  Yes Newt Lukes, MD  Multiple Vitamin (MULTIVITAMIN) tablet Take 1 tablet by mouth daily.     Yes Historical Provider, MD  omeprazole (PRILOSEC) 20 MG capsule Take 1 capsule (20 mg total) by mouth  daily. 03/12/11 03/14/15 Yes Newt Lukes, MD  oxyCODONE (ROXICODONE) 5 MG immediate release tablet Take 1 tablet (5 mg total) by mouth every 4 (four) hours as needed for severe pain. 03/10/15  Yes Raeford Razor, MD  sodium bicarbonate 650 MG tablet Take 1,300 mg by mouth 2 (two) times daily.    Yes Historical Provider, MD  sulfamethoxazole-trimethoprim (BACTRIM,SEPTRA) 400-80 MG per tablet Take 1 tablet by mouth every Monday, Wednesday, and Friday. continuous   Yes Historical Provider, MD  tacrolimus (PROGRAF) 5 MG capsule Take 5-10 mg by mouth 2 (two) times daily. Takes 2 in the morning and 1 at night 03/12/11  Yes Newt Lukes, MD  cyclobenzaprine (FLEXERIL) 10 MG tablet Take 1 tablet (10 mg total) by mouth 2 (two) times daily as needed for muscle spasms. 03/14/15   Vanetta Mulders, MD  ondansetron (ZOFRAN ODT) 4 MG disintegrating tablet Take 1 tablet (4 mg total) by mouth every 8 (eight) hours as needed for nausea or vomiting. 03/14/15   Vanetta Mulders, MD  oxyCODONE-acetaminophen (PERCOCET/ROXICET) 5-325 MG per tablet Take 2 tablets by mouth every 4 (four) hours as needed for severe pain. 03/14/15   Vanetta Mulders, MD   BP 153/85 mmHg  Pulse 63  Temp(Src) 98.2 F (36.8 C) (Oral)  Resp 16  Ht  (1.803 m)  Wt 160 lb (72.576 kg)  BMI 22.33 kg/m2  SpO2 99% Physical Exam  Constitutional: He is oriented to person, place, and time. He appears well-developed and well-nourished. No distress.  HENT:  Head: Normocephalic and atraumatic.  Mouth/Throat: Oropharynx is clear and moist.  Eyes: Conjunctivae and EOM are normal. Pupils are equal, round, and reactive to light.  Neck: Normal range of motion. Neck supple.  Cardiovascular: Normal rate, regular rhythm and normal heart sounds.   No murmur heard. Pulmonary/Chest: Effort normal and breath sounds normal. No respiratory distress.  Abdominal: Soft. Bowel sounds are normal. There is no tenderness.  Musculoskeletal: Normal range of motion. He  exhibits no edema.  Patient with good patient with good range of motion of both legs while sitting. No significant pain. Good capillary refill to the toes dorsalis pedis pulses 1+. Sensation intact. Palpation to the low back with some slight tenderness to the lower lumbar area.  Neurological: He is alert and oriented to person, place, and time. No cranial nerve deficit. He exhibits normal muscle tone. Coordination normal.  Skin: Skin is warm. No rash noted.  Nursing note and vitals reviewed.   ED Course  Procedures (including critical care time) Labs Review Labs Reviewed - No data to display  Imaging Review Ct Lumbar Spine Wo Contrast  03/14/2015   CLINICAL DATA:  Low back pain beginning 1 week ago, worse on the left. Initial encounter.  EXAM: CT LUMBAR SPINE WITHOUT CONTRAST  TECHNIQUE: Multidetector CT imaging of the lumbar spine was performed without intravenous contrast  administration. Multiplanar CT image reconstructions were also generated.  COMPARISON:  CT abdomen and pelvis 09/29/2011  FINDINGS: Vertebral body height and alignment are maintained. No lytic or sclerotic bony lesion is identified. There is no pars interarticularis defect. Imaged intra-abdominal contents demonstrate scattered aortic atherosclerosis without aneurysm.  T12-L1:  Negative.  L1-2:  Negative.  L2-3: Minimal disc bulge without central canal or foraminal narrowing.  L3-4: There is a shallow disc bulge with some ligamentum flavum thickening. Mild to moderate central canal narrowing is identified. The foramina are open.  L4-5: There is moderate facet arthropathy. There is a broad-based disc bulge and ligamentum flavum thickening. Mild to moderate central canal narrowing is identified. Moderate to moderately severe bilateral foraminal narrowing is also seen.  L5-S1: There is facet arthropathy and a shallow disc bulge. The central canal is widely patent. Moderate moderately severe bilateral foraminal narrowing appears worse on  the right.  IMPRESSION: No acute abnormality.  Lumbar spondylosis most notable from L3-4 to L5-S1 as described above.   Electronically Signed   By: Drusilla Kannerhomas  Dalessio M.D.   On: 03/14/2015 13:26     EKG Interpretation None      MDM   Final diagnoses:  Right-sided low back pain without sciatica    The patient now with a one-week history of right low back pain when standing up gets severe pain that goes in the buttocks and the upper part of 5. No foot numbness or weakness to the foot no persistent radiation of pain down the back of the right leg. No fall or injury. Patient does have a history of osteopenia. CT scan shows no evidence of any acute fracture or compression fracture. Patient's pain improved here with some pain medication. Patient is more comfortable while lying flat or with sitting up. [place] Patient will be treated with the additional pain medicine and Flexeril at home if not improved in a week patient[facility]. Patient stable for discharge home.    Vanetta MuldersScott Benita Boonstra, MD 03/14/15 [phone]

## 2015-03-14 NOTE — ED Notes (Addendum)
C/o low back pain around tailbone Onset 1 weeks ago. [facility]. Pain is worse on right than left. Worse with standing and walking.

## 2015-03-14 NOTE — ED Notes (Signed)
Pt vomited small amt of mucus into bag. States he felt better and was not nauseated now.

## 2015-03-14 NOTE — Discharge Instructions (Signed)
Breast as much as possible. Take pain medicine as directed. Flexeril as directed. Zofran as needed for nausea and vomiting. Follow-up if not improving in a week. Return for any new or worse symptoms.

## 2021-12-29 ENCOUNTER — Emergency Department (HOSPITAL_COMMUNITY): Payer: Medicare (Managed Care)

## 2021-12-29 ENCOUNTER — Emergency Department (HOSPITAL_COMMUNITY)
Admission: EM | Admit: 2021-12-29 | Discharge: 2021-12-29 | Disposition: A | Discharge status: Home / Self Care | Payer: Medicare (Managed Care) | Attending: Emergency Medicine | Admitting: Emergency Medicine

## 2021-12-29 DIAGNOSIS — S32010A Wedge compression fracture of first lumbar vertebra, initial encounter for closed fracture: Secondary | ICD-10-CM | POA: Insufficient documentation

## 2021-12-29 DIAGNOSIS — I10 Essential (primary) hypertension: Secondary | ICD-10-CM | POA: Diagnosis not present

## 2021-12-29 DIAGNOSIS — Z79899 Other long term (current) drug therapy: Secondary | ICD-10-CM | POA: Diagnosis not present

## 2021-12-29 DIAGNOSIS — W108XXA Fall (on) (from) other stairs and steps, initial encounter: Secondary | ICD-10-CM | POA: Insufficient documentation

## 2021-12-29 DIAGNOSIS — S3992XA Unspecified injury of lower back, initial encounter: Secondary | ICD-10-CM | POA: Diagnosis present

## 2021-12-29 LAB — BASIC METABOLIC PANEL
Anion gap: 6 (ref 5–15)
BUN: 38 mg/dL — ABNORMAL HIGH (ref 8–23)
CO2: 29 mmol/L (ref 22–32)
Calcium: 9.1 mg/dL (ref 8.9–10.3)
Chloride: 103 mmol/L (ref 98–111)
Creatinine, Ser: 2.55 mg/dL — ABNORMAL HIGH (ref 0.61–1.24)
GFR, Estimated: 27 mL/min — ABNORMAL LOW (ref >60)
Glucose, Bld: 120 mg/dL — ABNORMAL HIGH (ref 70–99)
Potassium: 4 mmol/L (ref 3.5–5.1)
Sodium: 138 mmol/L (ref 135–145)

## 2021-12-29 LAB — CBC
HCT: 38.7 % — ABNORMAL LOW (ref 39.0–52.0)
Hemoglobin: 12.8 g/dL — ABNORMAL LOW (ref 13.0–17.0)
MCH: 32.2 pg (ref 26.0–34.0)
MCHC: 33.1 g/dL (ref 30.0–36.0)
MCV: 97.5 fL (ref 80.0–100.0)
Platelets: 238 10*3/uL (ref 150–400)
RBC: 3.97 MIL/uL — ABNORMAL LOW (ref 4.22–5.81)
RDW: 12.8 % (ref 11.5–15.5)
WBC: 7.6 10*3/uL (ref 4.0–10.5)
nRBC: 0 % (ref 0.0–0.2)

## 2021-12-29 MED ORDER — MORPHINE SULFATE (PF) 4 MG/ML IV SOLN
4.0000 mg | Freq: Once | INTRAVENOUS | Status: AC
  Administered 2021-12-29: 4 mg via INTRAVENOUS
  Filled 2021-12-29: qty 1

## 2021-12-29 MED ORDER — OXYCODONE HCL 5 MG PO TABS
5.0000 mg | ORAL_TABLET | ORAL | Status: AC
  Administered 2021-12-29: 5 mg via ORAL
  Filled 2021-12-29: qty 1

## 2021-12-29 MED ORDER — METHOCARBAMOL 500 MG PO TABS
750.0000 mg | ORAL_TABLET | Freq: Once | ORAL | Status: AC
  Administered 2021-12-29: 750 mg via ORAL
  Filled 2021-12-29: qty 2

## 2021-12-29 MED ORDER — OXYCODONE-ACETAMINOPHEN 5-325 MG PO TABS: 1.0000 | ORAL_TABLET | Freq: Four times a day (QID) | ORAL | 0 refills | Status: AC | PRN

## 2021-12-29 NOTE — Discharge Instructions (Addendum)
[facility] to the [facility] today to be evaluated for your back pain.  The CT scan of your back shows mild compression fractures of T5, T8, T9, and T11 which appear chronic.  An acute compression fracture of L1 vertebra was noted as well.  Please use the T SLO brace as we discussed.  Please follow-up with the neurosurgical team in the outpatient setting. ? ?Your kidney function was found to be elevated with creatinine at 2.55.  We discussed admission versus close follow-up with your providers in the outpatient setting.  Please call your providers to schedule a follow-up appointment. ? ?Today you received medications that may make you sleepy or impair your ability to make decisions.  For the next 24 hours please do not drive, operate heavy machinery, care for a small child with out another adult present, or perform any activities that may cause harm to you or someone else if you were to fall asleep or be impaired.  ? ?You are being prescribed a medication which may make you sleepy. Please follow up of listed precautions for at least 24 hours after taking one dose. ? ?Get help right away if: ?You have difficulty breathing. ?Your pain is very bad and it suddenly gets worse. ?You have numbness, tingling, or weakness in any part of your body. ?You are unable to empty your bladder. ?You cannot control your bladder or bowels. ?You are unable to move any body part (paralysis) that is below the level of your injury. ?You vomit. ?You have pain in your abdomen. ?

## 2021-12-29 NOTE — Progress Notes (Signed)
Orthopedic Tech Progress Note ?Patient Details:  ?[name] ?08-10-1951 ?[id number] ? ?Patient ID: [name], male   DOB: 06-Aug-1951, 71 y.o.   MRN: [id number] ? ?[name] ?12/29/2021, 2:49 PM ?TLSO ordered from [facility] ?

## 2021-12-29 NOTE — ED Provider Notes (Signed)
?[facility]-EMERGENCY DEPT ?Provider Note ? ? ?CSN: [id number] ?Arrival date & time: 12/29/21  1214 ? ?  ? ?History ? ?Chief Complaint  ?Patient presents with  ? Fall  ? Back Pain  ? ? ?[name] is a 71 y.o. male. ? ? ?Fall ? ?Back Pain ? ?Patient is a 71 year old gentleman with a past medical history significant for liver disease status post liver transplant, HTN ? ?He is presented emergency room today after mechanical fall.  He states that he slipped on a stair while walking up a set of stairs.  He states that he fell down onto the landing below approximately 1-2 stairs up.  ?He states he landed straight onto his buttocks and had sudden onset low back pain.  He states it is constant achy has had no medications prior to arrival he was brought in by EMS because he was having some difficulty moving but he was able to get up and walk after the fall.  He denies any head injury and denies any loss of consciousness nausea vomiting.  He denies any blood thinner use. ? ? ? ?  ? ?Home Medications ?Prior to Admission medications   ?Medication Sig Start Date End Date Taking? Authorizing Provider  ?amLODipine (NORVASC) 5 MG tablet TAKE ONE TABLET BY MOUTH EVERY DAY 04/01/12   Newt LukesLeschber, Valerie A, MD  ?Ascorbic Acid (VITAMIN C) 500 MG tablet Take 500 mg by mouth daily.      [provider]  ?Calcium-Magnesium-Vitamin D (CITRACAL CALCIUM+D PO) Take 2 tablets by mouth 2 (two) times daily.     [provider]  ?cyclobenzaprine (FLEXERIL) 10 MG tablet Take 1 tablet (10 mg total) by mouth 2 (two) times daily as needed for muscle spasms. 03/14/15   Vanetta MuldersZackowski, Scott, MD  ?ferrous sulfate 325 (65 FE) MG tablet Take 325 mg by mouth daily with breakfast.     [provider]  ?fludrocortisone (FLORINEF) 0.1 MG tablet Take 0.1 mg by mouth daily.    [provider]  ?hydrochlorothiazide (HYDRODIURIL) 25 MG tablet Take 12.5 mg by mouth daily.     [provider]  ?Magnesium  Oxide 400 MG CAPS Take 400 mg by mouth 3 (three) times daily as needed (cramps).     [provider]  ?methocarbamol (ROBAXIN) 500 MG tablet Take 1 tablet (500 mg total) by mouth 4 (four) times daily. 03/10/15   Raeford RazorKohut, Stephen, MD  ?metoprolol (LOPRESSOR) 50 MG tablet Take 1 tablet (50 mg total) by mouth 2 (two) times daily. 05/04/11   Newt LukesLeschber, Valerie A, MD  ?Multiple Vitamin (MULTIVITAMIN) tablet Take 1 tablet by mouth daily.      [provider]  ?omeprazole (PRILOSEC) 20 MG capsule Take 1 capsule (20 mg total) by mouth daily. 03/12/11 03/14/15  Newt LukesLeschber, Valerie A, MD  ?ondansetron (ZOFRAN ODT) 4 MG disintegrating tablet Take 1 tablet (4 mg total) by mouth every 8 (eight) hours as needed for nausea or vomiting. 03/14/15   Vanetta MuldersZackowski, Scott, MD  ?oxyCODONE (ROXICODONE) 5 MG immediate release tablet Take 1 tablet (5 mg total) by mouth every 4 (four) hours as needed for severe pain. 03/10/15   Raeford RazorKohut, Stephen, MD  ?oxyCODONE-acetaminophen (PERCOCET/ROXICET) 5-325 MG per tablet Take 2 tablets by mouth every 4 (four) hours as needed for severe pain. 03/14/15   Vanetta MuldersZackowski, Scott, MD  ?sodium bicarbonate 650 MG tablet Take 1,300 mg by mouth 2 (two) times daily.     [provider]  ?sulfamethoxazole-trimethoprim (BACTRIM,SEPTRA) 400-80 MG per  tablet Take 1 tablet by mouth every Monday, Wednesday, and Friday. continuous    [provider]  ?tacrolimus (PROGRAF) 5 MG capsule Take 5-10 mg by mouth 2 (two) times daily. Takes 2 in the morning and 1 at night 03/12/11   Newt Lukes, MD  ?   ? ?Allergies    ?Moxifloxacin   ? ?Review of Systems   ?Review of Systems  ?Musculoskeletal:  Positive for back pain.  ? ?Physical Exam ?Updated Vital Signs ?BP (!) 143/93   Pulse 78   Temp 97.8 ?F (36.6 ?C) (Oral)   Resp 19   Ht 5\' 11"  (1.803 m)   Wt 70.3 kg   SpO2 98%   BMI 21.62 kg/m?  ?Physical Exam ?Vitals and nursing note reviewed.  ?Constitutional:   ?   General: He is not in acute distress. ?HENT:   ?   Head: Normocephalic and atraumatic.  ?   Comments: No evidence of trauma no tenderness of skull ?   Nose: Nose normal.  ?   Mouth/Throat:  ?   Mouth: Mucous membranes are moist.  ?Eyes:  ?   General: No scleral icterus. ?Cardiovascular:  ?   Rate and Rhythm: Normal rate and regular rhythm.  ?   Pulses: Normal pulses.  ?   Heart sounds: Normal heart sounds.  ?Pulmonary:  ?   Effort: Pulmonary effort is normal. No respiratory distress.  ?   Breath sounds: No wheezing.  ?Abdominal:  ?   Palpations: Abdomen is soft.  ?   Tenderness: There is no abdominal tenderness.  ?Musculoskeletal:  ?   Cervical back: Normal range of motion.  ?   Right lower leg: No edema.  ?   Left lower leg: No edema.  ?   Comments: No bony tenderness over joints or long bones of the upper and lower extremities.   ? ?No neck midline tenderness, step-off, deformity, or bruising. Able to turn head left and right 45 degrees without difficulty. ?There is midline upper L-spine/lower T-spine tenderness. ? ?Full range of motion of upper and lower extremity joints shown after palpation was conducted; with 5/5 symmetrical strength in upper and lower extremities. No chest wall tenderness, no facial or cranial tenderness.  ? ?Patient has intact sensation grossly in lower and upper extremities. Intact patellar and ankle reflexes. Patient able to ambulate without difficulty.  ?Radial and DP pulses palpated BL.  ?   ?Skin: ?   General: Skin is warm and dry.  ?   Capillary Refill: Capillary refill takes less than 2 seconds.  ?Neurological:  ?   Mental Status: He is alert. Mental status is at baseline.  ?Psychiatric:     ?   Mood and Affect: Mood normal.     ?   Behavior: Behavior normal.  ? ? ?ED Results / Procedures / Treatments   ?Labs ?(all labs ordered are listed, but only abnormal results are displayed) ?Labs Reviewed  ?BASIC METABOLIC PANEL  ?CBC  ? ? ?EKG ?None ? ?Radiology ?CT Thoracic Spine Wo Contrast ? ?Result Date: 12/29/2021 ?CLINICAL DATA:  Fall  down steps. EXAM: CT THORACIC AND LUMBAR SPINE WITHOUT CONTRAST TECHNIQUE: Multidetector CT imaging of the thoracic and lumbar spine was performed without contrast. Multiplanar CT image reconstructions were also generated. RADIATION DOSE REDUCTION: This exam was performed according to the departmental dose-optimization program which includes automated exposure control, adjustment of the mA and/or kV according to patient size and/or use of iterative reconstruction technique. COMPARISON:  CT lumbar  03/14/2015 FINDINGS: CT THORACIC SPINE FINDINGS Alignment: Normal Vertebrae: Mild loss of height of T5, T8, T9, T11 consistent with fractures. These appear chronic. Acute fracture of L1 as described below. Paraspinal and other soft tissues: Negative for paraspinous mass or edema. Mild bibasilar atelectasis. Disc levels: No significant thoracic spinal stenosis Disc degeneration and Schmorl's nodes at T7-8, T8-9, T9-10, T10-11 CT LUMBAR SPINE FINDINGS Segmentation: 5 lumbar vertebra Alignment: 3 mm anterolisthesis L4-5.  Otherwise normal alignment Vertebrae: Acute compression fracture of L1 vertebral body. Approximately 45% loss of vertebral body height. Acute fracture lines are seen extending into the superior and inferior endplate of L1. Fracture also extends laterally just below the pedicle. Posterior wall of the vertebral body appears intact. No retropulsion into the canal. Paraspinal and other soft tissues: No paraspinous mass or edema. Atherosclerotic calcification of the aorta without aneurysm. Disc levels: T12-L1: Negative L1-2: Negative for stenosis.  Mild facet degeneration L2-3: Mild disc and facet degeneration.  Negative for stenosis L3-4: Disc bulging and mild spinal stenosis. Mild posterior element hypertrophy L4-5: 3 mm anterolisthesis. Advanced disc degeneration with disc space narrowing and extensive endplate sclerosis left greater than right. Right laminectomy. Moderate to advanced facet degeneration  bilaterally. Mild spinal stenosis. Mild to moderate right subarticular stenosis L5-S1: Mild disc bulging. Bilateral facet hypertrophy right greater than left. Right subarticular stenosis. IMPRESSION: CT THORACIC

## 2021-12-29 NOTE — ED Notes (Signed)
Pt ambulatory in hallway with TLSO brace without difficulty. EDP made aware  ?

## 2021-12-29 NOTE — ED Notes (Signed)
Pt transported to CT ?

## 2021-12-29 NOTE — ED Notes (Signed)
Pt d/c home per MD order, Discharge summary reviewed with pt, pt verbalizes understanding. Discharge ride home here, no s/s of acute distress noted at discharge,  ?

## 2021-12-29 NOTE — ED Triage Notes (Signed)
Pt BIB [facility] from home for mechanical fall, slipped on 2nd stair fron landing, fell backwards onto tailbone and hit wall. Denies head trauma, LOC, dizziness, tingling. Able to ambulate to stretcher. No blood thinners. Hx HTN and liver transplant. C/o 6/10 lower back pain toward hips. No deformity noted ? ?148/92  ?HR 82 ?93% RA ?RR 18 ?

## 2021-12-29 NOTE — ED Provider Notes (Signed)
?Care of patient assumed from PA [place] at 1510.  Agree with history, physical exam and plan.  See their note for further details. Briefly, 71 year old male with pertinent past medical history of liver disease status post liver transplant and hypertension.  Presents to the ED after mechanical fall.  Patient has pain to lumbar back. ? ? ? ?Physical Exam  ?BP (!) 143/93   Pulse 78   Temp 97.8 ?F (36.6 ?C) (Oral)   Resp 19   Ht 5\' 11"  (1.803 m)   Wt 70.3 kg   SpO2 98%   BMI 21.62 kg/m?  ? ?Physical Exam ?Vitals and nursing note reviewed.  ?Constitutional:   ?   General: He is not in acute distress. ?   Appearance: He is not ill-appearing, toxic-appearing or diaphoretic.  ?HENT:  ?   Head: Normocephalic.  ?Eyes:  ?   General: No scleral icterus.    ?   Right eye: No discharge.     ?   Left eye: No discharge.  ?Cardiovascular:  ?   Rate and Rhythm: Normal rate.  ?Pulmonary:  ?   Effort: Pulmonary effort is normal.  ?Skin: ?   General: Skin is warm and dry.  ?Neurological:  ?   General: No focal deficit present.  ?   Mental Status: He is alert.  ?Psychiatric:     ?   Behavior: Behavior is cooperative.  ? ? ?Procedures  ?Procedures ? ?ED Course / MDM  ? ?Clinical Course as of 12/29/21 1509  ?Tue Dec 29, 2021  ?1230 Foot slipped off step and caused him to fall to the ground (the landing between two sets of steps). He was going up the stairs.  ? ? [WF]  ?1326 I personally viewed images of lumbar spine CT scan and agree with radiology read.  Compression fracture 45% height loss ? ?Acute compression fracture of L1 vertebral body. Approximately 45% ?vertebral body height loss. No retropulsion into the canal and no ?spinal stenosis ? ?We will attempt to get pain under control and disposition. [WF]  ?  ?Clinical Course User Index ?[WF] Dec 31, 2021, [facility]  ? ?Medical Decision Making ?Amount and/or Complexity of Data Reviewed ?Labs: ordered. ?Radiology: ordered. ? ?Risk ?Prescription drug management. ? ? ?At time of  handoff labs pending.  Patient will need TlSO brace.  Plan to stand and ambulate patient.  If able to tolerate will discharge with neurosurgery follow-up. ? ?CT imaging showed acute compression fracture of L1 vertebra. ? ?I personally viewed interpret patient's lab results.  Pertinent findings include: ?-creatinine 2.55; this is improved from labs obtained on 12/18/2021 which showed creatinine of 2.93 ? ?Shared decision-making with patient about admission versus close follow-up in outpatient setting for his increased creatinine.  Patient elects for close follow-up in outpatient setting.  Discussed importance of p.o. intake. ? ?Patient able to stand and ambulate without difficulty after he LSO brace applied.  We will give patient information to follow-up with neurosurgery in the outpatient setting.  Patient prescribed a short course of pain medication. ? ?Based on patient's chief complaint, I considered admission might be necessary, however after reassuring ED workup feel patient is reasonable for discharge.  Discussed results, findings, treatment and follow up. Patient advised of return precautions. Patient verbalized understanding and agreed with plan. ? ?Portions of this note were generated with 12/20/2021. Dictation errors may occur despite best attempts at proofreading. ? ? ? ?  ?Scientist, clinical (histocompatibility and immunogenetics), PA-C ?12/29/21 1700 ? ?  ?  Milagros Loll, MD ?12/30/21 [phone] ? ?

## 2022-01-08 ENCOUNTER — Encounter (HOSPITAL_BASED_OUTPATIENT_CLINIC_OR_DEPARTMENT_OTHER): Payer: Self-pay

## 2022-01-08 ENCOUNTER — Emergency Department (HOSPITAL_BASED_OUTPATIENT_CLINIC_OR_DEPARTMENT_OTHER)
Admission: EM | Admit: 2022-01-08 | Discharge: 2022-01-08 | Disposition: A | Discharge status: Home / Self Care | Payer: Medicare (Managed Care) | Attending: Emergency Medicine | Admitting: Emergency Medicine

## 2022-01-08 ENCOUNTER — Emergency Department (HOSPITAL_BASED_OUTPATIENT_CLINIC_OR_DEPARTMENT_OTHER): Payer: Medicare (Managed Care)

## 2022-01-08 ENCOUNTER — Other Ambulatory Visit: Payer: Self-pay

## 2022-01-08 DIAGNOSIS — S62342A Nondisplaced fracture of base of third metacarpal bone, right hand, initial encounter for closed fracture: Secondary | ICD-10-CM | POA: Diagnosis not present

## 2022-01-08 DIAGNOSIS — S3992XA Unspecified injury of lower back, initial encounter: Secondary | ICD-10-CM | POA: Diagnosis present

## 2022-01-08 DIAGNOSIS — S62302A Unspecified fracture of third metacarpal bone, right hand, initial encounter for closed fracture: Secondary | ICD-10-CM

## 2022-01-08 DIAGNOSIS — S32010A Wedge compression fracture of first lumbar vertebra, initial encounter for closed fracture: Secondary | ICD-10-CM | POA: Diagnosis not present

## 2022-01-08 DIAGNOSIS — Y92002 Bathroom of unspecified non-institutional (private) residence single-family (private) house as the place of occurrence of the external cause: Secondary | ICD-10-CM | POA: Diagnosis not present

## 2022-01-08 DIAGNOSIS — W19XXXA Unspecified fall, initial encounter: Secondary | ICD-10-CM | POA: Insufficient documentation

## 2022-01-08 MED ORDER — OXYCODONE HCL 5 MG PO TABS: 5.0000 mg | ORAL_TABLET | Freq: Four times a day (QID) | ORAL | 0 refills | Status: DC | PRN

## 2022-01-08 MED ORDER — MORPHINE SULFATE (PF) 4 MG/ML IV SOLN
4.0000 mg | Freq: Once | INTRAVENOUS | Status: AC
  Administered 2022-01-08: 4 mg via INTRAMUSCULAR
  Filled 2022-01-08: qty 1

## 2022-01-08 NOTE — Discharge Instructions (Addendum)
I have prescribed you a strong narcotic called Roxicodone. Please only take this as prescribed. Do not drive or operate heavy machinery after taking this medication. Do not mix it with alcohol.  ? ?Please follow-up with Dr. Tempie Donning with hand surgery regarding the fracture in your right hand.  Please continue to wear the splint until you have followed up with Dr. Tempie Donning regarding this fracture. ? ?Please follow-up with neurosurgery regarding your L1 compression fracture. ? ?Please return to the emergency department with any new or worsening symptoms. ?

## 2022-01-08 NOTE — ED Provider Notes (Signed)
?[facility] EMERGENCY DEPARTMENT ?Provider Note ? ? ?CSN: [id number] ?Arrival date & time: 01/08/22  1809 ? ?  ? ?History ? ?Chief Complaint  ?Patient presents with  ? Back Pain  ? ? ?[name] is a 71 y.o. male. ? ?HPI ? ?Patient is a 71 year old male who presents to the emergency department for reevaluation of low back pain.  Initially seen on April 25 status post a fall.  Previous provider obtained a CT scan of the thoracic and lumbar spine with findings as noted below: ? ?IMPRESSION: ?CT THORACIC SPINE IMPRESSION ?  ?Mild compression fractures of T5, T8, T9, T11 appear chronic. No ?significant thoracic spinal stenosis ?  ?CT LUMBAR SPINE IMPRESSION ?  ?Acute compression fracture of L1 vertebral body. Approximately 45% ?vertebral body height loss. No retropulsion into the canal and no ?spinal stenosis ?  ?Multilevel degenerative change in the lumbar spine most severe ?L5-S1. ? ?Patient placed in a TLSO brace and discharged in stable condition.  He states that his pain is persisted.  He was taking Roxicodone but states that he ran out a few days ago and his pain became worse once again.  No new falls.  He does note that yesterday while working in the bathroom he got his right hand stuck beneath the door and now has bruising and swelling along the dorsum of the right hand.  Denies any numbness, weakness, saddle anesthesia, bowel/bladder incontinence. ? ?  ? ?Home Medications ?Prior to Admission medications   ?Medication Sig Start Date End Date Taking? Authorizing Provider  ?oxyCODONE (ROXICODONE) 5 MG immediate release tablet Take 1 tablet (5 mg total) by mouth every 6 (six) hours as needed for severe pain. 01/08/22  Yes Rayna Sexton, PA-C  ?amLODipine (NORVASC) 5 MG tablet TAKE ONE TABLET BY MOUTH EVERY DAY 04/01/12   Rowe Clack, MD  ?Ascorbic Acid (VITAMIN C) 500 MG tablet Take 500 mg by mouth daily.      [provider]  ?Calcium-Magnesium-Vitamin D (CITRACAL CALCIUM+D PO) Take  2 tablets by mouth 2 (two) times daily.     [provider]  ?cyclobenzaprine (FLEXERIL) 10 MG tablet Take 1 tablet (10 mg total) by mouth 2 (two) times daily as needed for muscle spasms. 03/14/15   Fredia Sorrow, MD  ?ferrous sulfate 325 (65 FE) MG tablet Take 325 mg by mouth daily with breakfast.     [provider]  ?fludrocortisone (FLORINEF) 0.1 MG tablet Take 0.1 mg by mouth daily.    [provider]  ?hydrochlorothiazide (HYDRODIURIL) 25 MG tablet Take 12.5 mg by mouth daily.     [provider]  ?Magnesium Oxide 400 MG CAPS Take 400 mg by mouth 3 (three) times daily as needed (cramps).     [provider]  ?methocarbamol (ROBAXIN) 500 MG tablet Take 1 tablet (500 mg total) by mouth 4 (four) times daily. 03/10/15   Virgel Manifold, MD  ?metoprolol (LOPRESSOR) 50 MG tablet Take 1 tablet (50 mg total) by mouth 2 (two) times daily. 05/04/11   Rowe Clack, MD  ?Multiple Vitamin (MULTIVITAMIN) tablet Take 1 tablet by mouth daily.      [provider]  ?omeprazole (PRILOSEC) 20 MG capsule Take 1 capsule (20 mg total) by mouth daily. 03/12/11 03/14/15  Rowe Clack, MD  ?ondansetron (ZOFRAN ODT) 4 MG disintegrating tablet Take 1 tablet (4 mg total) by mouth every 8 (eight) hours as needed for nausea or vomiting. 03/14/15   Fredia Sorrow, MD  ?sodium  bicarbonate 650 MG tablet Take 1,300 mg by mouth 2 (two) times daily.     [provider]  ?sulfamethoxazole-trimethoprim (BACTRIM,SEPTRA) 400-80 MG per tablet Take 1 tablet by mouth every Monday, Wednesday, and Friday. continuous    [provider]  ?tacrolimus (PROGRAF) 5 MG capsule Take 5-10 mg by mouth 2 (two) times daily. Takes 2 in the morning and 1 at night 03/12/11   Newt Lukes, MD  ?   ? ?Allergies    ?Moxifloxacin   ? ?Review of Systems   ?Review of Systems  ?All other systems reviewed and are negative. ?Ten systems reviewed and are negative for acute change, except as noted  in the HPI.   ?Physical Exam ?Updated Vital Signs ?BP 137/79 (BP Location: Left Arm)   Pulse 81   Temp 98.1 ?F (36.7 ?C) (Oral)   Resp 16   Ht 5' 10.5" (1.791 m)   Wt 70.3 kg   SpO2 97%   BMI 21.93 kg/m?  ?Physical Exam ?Vitals and nursing note reviewed.  ?Constitutional:   ?   General: He is not in acute distress. ?   Appearance: He is well-developed.  ?HENT:  ?   Head: Normocephalic and atraumatic.  ?   Right Ear: External ear normal.  ?   Left Ear: External ear normal.  ?Eyes:  ?   General: No scleral icterus.    ?   Right eye: No discharge.     ?   Left eye: No discharge.  ?   Conjunctiva/sclera: Conjunctivae normal.  ?Neck:  ?   Trachea: No tracheal deviation.  ?Cardiovascular:  ?   Rate and Rhythm: Normal rate.  ?Pulmonary:  ?   Effort: Pulmonary effort is normal. No respiratory distress.  ?   Breath sounds: No stridor.  ?Abdominal:  ?   General: There is no distension.  ?Musculoskeletal:     ?   General: Tenderness present. No swelling or deformity.  ?   Cervical back: Neck supple.  ?   Comments: TLSO brace in place.  Moderate tenderness noted over the midline lumbar spine in the region of the L1 vertebra.  No midline C, T, or S-spine tenderness noted.  No step-offs, crepitus, or deformities. ? ?Moderate TTP noted diffusely along the dorsum of the right hand.  Additional ecchymosis noted in the region.  Grip strength intact.  2+ radial pulse.  Distal sensation intact.  Good cap refill.  ?Skin: ?   General: Skin is warm and dry.  ?   Findings: No rash.  ?Neurological:  ?   General: No focal deficit present.  ?   Mental Status: He is alert and oriented to person, place, and time.  ?   Cranial Nerves: Cranial nerve deficit: no gross deficits.  ?   Comments: Strength is 5/5 in the bilateral lower extremities.  Palpable pedal pulses.  Distal sensation intact.  ? ?ED Results / Procedures / Treatments   ?Labs ?(all labs ordered are listed, but only abnormal results are displayed) ?Labs Reviewed - No data to  display ? ?EKG ?None ? ?Radiology ?DG Hand Complete Right ? ?Result Date: 01/08/2022 ?CLINICAL DATA:  Right hand pain. EXAM: RIGHT HAND - COMPLETE 3+ VIEW COMPARISON:  None Available. FINDINGS: Nondisplaced fracture of the base of the third metacarpal. No other acute fracture. There is no dislocation. The bones are osteopenic. There is moderate to severe degenerative changes of the base of the thumb. The soft tissues are unremarkable. IMPRESSION: Nondisplaced fracture of  the base of the third metacarpal. Electronically Signed   By: Anner Crete M.D.   On: 01/08/2022 19:34   ? ?Procedures ?Procedures  ? ?Medications Ordered in ED ?Medications  ?morphine (PF) 4 MG/ML injection 4 mg (4 mg Intramuscular Given 01/08/22 1858)  ? ? ?ED Course/ Medical Decision Making/ A&P ?Clinical Course as of 01/09/22 1614  ?Fri Jan 09, 8119  ?1340 71 year old male with recent lumbar compression fracture here with a right hand injury after he accidentally caught it under a door.  X-ray showing third metacarpal base fracture.  Nondisplaced.  Will place in splint and refer on to hand surgery. [MB]  ?  ?Clinical Course User Index ?[MB] Hayden Rasmussen, MD  ? ?                        ?Medical Decision Making ?Amount and/or Complexity of Data Reviewed ?Radiology: ordered. ? ?Risk ?Prescription drug management. ? ?Pt is a 71 y.o. male who presents to the emergency department for reevaluation of low back pain as well as new onset right hand pain.  Patient states that since being discharged from the hospital with a L1 compression fracture he is continue to wear his TLSO brace.  No new falls or trauma.  States that he ran out of her oxycodone and is now having worsening pain in the lumbar region.  No numbness, weakness, bowel/bladder incontinence.  States that he has not followed up with neurosurgery.  Also recently notes that he was working at his home and accidentally got his hand stuck in a door and reports pain and ecchymosis along the  dorsum of the right hand. ? ?Imaging: ?X-ray of the right hand shows a nondisplaced fracture of the base of the third metacarpal. ? ?I, Rayna Sexton, PA-C, personally reviewed and evaluated these images and l

## 2022-01-08 NOTE — ED Triage Notes (Addendum)
PER EMS: pt had mechanical fall at home 4/25, compression fracture to L1 and sent home from [facility] and told to follow up with neurosurgery and was prescribed pain meds. He arrives with back brace on. He has not followed up with neuro surgery yet and has run out of his pain meds.  ? ?States he lives in [place] and wanted to wait to follow up with neurosurgery up there and wants to be cleared to travel. ? ?BP- 124/82, HR-84, RR-16, O2-98% ?

## 2022-01-08 NOTE — ED Notes (Signed)
X-ray at bedside

## 2022-01-08 NOTE — ED Notes (Signed)
Pt also reports pain and bruising to right hand s/p falling forward when he was already on his knees when trying to flush his toilet (working with the back lid of the toilet). He reports he was wearing his back brace during this incident but admits to not wearing it at home like he is supposed to for the past 2 days because it pushes into his groin when he tries to get up from a sitting position. ?

## 2022-01-18 NOTE — ED Notes (Addendum)
Pt called requesting information from discharge paperwork. Opened chart to answer questions. ?

## 2023-01-22 ENCOUNTER — Emergency Department (HOSPITAL_COMMUNITY): Payer: Medicare (Managed Care)

## 2023-01-22 ENCOUNTER — Inpatient Hospital Stay (HOSPITAL_COMMUNITY)
Admission: EM | Admit: 2023-01-22 | Discharge: 2023-01-27 | DRG: 682 | Disposition: A | Discharge status: Home Health | Payer: Medicare (Managed Care) | Attending: Internal Medicine | Admitting: Internal Medicine

## 2023-01-22 ENCOUNTER — Encounter (HOSPITAL_COMMUNITY): Payer: Self-pay

## 2023-01-22 ENCOUNTER — Other Ambulatory Visit: Payer: Self-pay

## 2023-01-22 DIAGNOSIS — S62342A Nondisplaced fracture of base of third metacarpal bone, right hand, initial encounter for closed fracture: Secondary | ICD-10-CM | POA: Diagnosis present

## 2023-01-22 DIAGNOSIS — M4856XA Collapsed vertebra, not elsewhere classified, lumbar region, initial encounter for fracture: Secondary | ICD-10-CM | POA: Diagnosis present

## 2023-01-22 DIAGNOSIS — M810 Age-related osteoporosis without current pathological fracture: Secondary | ICD-10-CM | POA: Diagnosis present

## 2023-01-22 DIAGNOSIS — G9341 Metabolic encephalopathy: Secondary | ICD-10-CM | POA: Diagnosis present

## 2023-01-22 DIAGNOSIS — W19XXXA Unspecified fall, initial encounter: Secondary | ICD-10-CM | POA: Diagnosis present

## 2023-01-22 DIAGNOSIS — F32A Depression, unspecified: Secondary | ICD-10-CM | POA: Diagnosis present

## 2023-01-22 DIAGNOSIS — M25511 Pain in right shoulder: Secondary | ICD-10-CM | POA: Diagnosis present

## 2023-01-22 DIAGNOSIS — R1312 Dysphagia, oropharyngeal phase: Secondary | ICD-10-CM | POA: Diagnosis present

## 2023-01-22 DIAGNOSIS — S22039A Unspecified fracture of third thoracic vertebra, initial encounter for closed fracture: Secondary | ICD-10-CM | POA: Diagnosis present

## 2023-01-22 DIAGNOSIS — Y92032 Bedroom in apartment as the place of occurrence of the external cause: Secondary | ICD-10-CM | POA: Diagnosis not present

## 2023-01-22 DIAGNOSIS — E162 Hypoglycemia, unspecified: Secondary | ICD-10-CM | POA: Diagnosis present

## 2023-01-22 DIAGNOSIS — F1020 Alcohol dependence, uncomplicated: Secondary | ICD-10-CM | POA: Diagnosis present

## 2023-01-22 DIAGNOSIS — S0081XA Abrasion of other part of head, initial encounter: Secondary | ICD-10-CM | POA: Diagnosis present

## 2023-01-22 DIAGNOSIS — Z1152 Encounter for screening for COVID-19: Secondary | ICD-10-CM | POA: Diagnosis not present

## 2023-01-22 DIAGNOSIS — K219 Gastro-esophageal reflux disease without esophagitis: Secondary | ICD-10-CM | POA: Diagnosis present

## 2023-01-22 DIAGNOSIS — Z944 Liver transplant status: Secondary | ICD-10-CM

## 2023-01-22 DIAGNOSIS — I10 Essential (primary) hypertension: Secondary | ICD-10-CM | POA: Diagnosis present

## 2023-01-22 DIAGNOSIS — Z801 Family history of malignant neoplasm of trachea, bronchus and lung: Secondary | ICD-10-CM

## 2023-01-22 DIAGNOSIS — R338 Other retention of urine: Secondary | ICD-10-CM | POA: Diagnosis present

## 2023-01-22 DIAGNOSIS — N184 Chronic kidney disease, stage 4 (severe): Secondary | ICD-10-CM | POA: Diagnosis present

## 2023-01-22 DIAGNOSIS — E860 Dehydration: Secondary | ICD-10-CM | POA: Diagnosis present

## 2023-01-22 DIAGNOSIS — D509 Iron deficiency anemia, unspecified: Secondary | ICD-10-CM | POA: Diagnosis present

## 2023-01-22 DIAGNOSIS — I129 Hypertensive chronic kidney disease with stage 1 through stage 4 chronic kidney disease, or unspecified chronic kidney disease: Secondary | ICD-10-CM | POA: Diagnosis present

## 2023-01-22 DIAGNOSIS — S20319A Abrasion of unspecified front wall of thorax, initial encounter: Secondary | ICD-10-CM | POA: Diagnosis present

## 2023-01-22 DIAGNOSIS — G8929 Other chronic pain: Secondary | ICD-10-CM | POA: Diagnosis present

## 2023-01-22 DIAGNOSIS — Z7409 Other reduced mobility: Secondary | ICD-10-CM | POA: Diagnosis present

## 2023-01-22 DIAGNOSIS — E8722 Chronic metabolic acidosis: Secondary | ICD-10-CM | POA: Diagnosis present

## 2023-01-22 DIAGNOSIS — M4802 Spinal stenosis, cervical region: Secondary | ICD-10-CM | POA: Diagnosis present

## 2023-01-22 DIAGNOSIS — Z79621 Long term (current) use of calcineurin inhibitor: Secondary | ICD-10-CM

## 2023-01-22 DIAGNOSIS — Z7189 Other specified counseling: Secondary | ICD-10-CM | POA: Diagnosis not present

## 2023-01-22 DIAGNOSIS — S32030A Wedge compression fracture of third lumbar vertebra, initial encounter for closed fracture: Secondary | ICD-10-CM | POA: Diagnosis not present

## 2023-01-22 DIAGNOSIS — S0011XA Contusion of right eyelid and periocular area, initial encounter: Secondary | ICD-10-CM | POA: Diagnosis present

## 2023-01-22 DIAGNOSIS — Z8249 Family history of ischemic heart disease and other diseases of the circulatory system: Secondary | ICD-10-CM

## 2023-01-22 DIAGNOSIS — Z79899 Other long term (current) drug therapy: Secondary | ICD-10-CM

## 2023-01-22 DIAGNOSIS — E876 Hypokalemia: Secondary | ICD-10-CM | POA: Diagnosis present

## 2023-01-22 DIAGNOSIS — M48061 Spinal stenosis, lumbar region without neurogenic claudication: Secondary | ICD-10-CM | POA: Diagnosis present

## 2023-01-22 DIAGNOSIS — N1831 Chronic kidney disease, stage 3a: Secondary | ICD-10-CM | POA: Diagnosis not present

## 2023-01-22 DIAGNOSIS — N179 Acute kidney failure, unspecified: Secondary | ICD-10-CM | POA: Insufficient documentation

## 2023-01-22 DIAGNOSIS — D7589 Other specified diseases of blood and blood-forming organs: Secondary | ICD-10-CM | POA: Diagnosis present

## 2023-01-22 DIAGNOSIS — Z515 Encounter for palliative care: Secondary | ICD-10-CM | POA: Diagnosis not present

## 2023-01-22 DIAGNOSIS — Z85828 Personal history of other malignant neoplasm of skin: Secondary | ICD-10-CM

## 2023-01-22 DIAGNOSIS — Z881 Allergy status to other antibiotic agents status: Secondary | ICD-10-CM

## 2023-01-22 DIAGNOSIS — F1021 Alcohol dependence, in remission: Secondary | ICD-10-CM

## 2023-01-22 DIAGNOSIS — I471 Supraventricular tachycardia, unspecified: Secondary | ICD-10-CM | POA: Diagnosis present

## 2023-01-22 DIAGNOSIS — Z8261 Family history of arthritis: Secondary | ICD-10-CM

## 2023-01-22 DIAGNOSIS — Z7952 Long term (current) use of systemic steroids: Secondary | ICD-10-CM

## 2023-01-22 DIAGNOSIS — N183 Chronic kidney disease, stage 3 unspecified: Secondary | ICD-10-CM | POA: Insufficient documentation

## 2023-01-22 DIAGNOSIS — N401 Enlarged prostate with lower urinary tract symptoms: Secondary | ICD-10-CM | POA: Diagnosis present

## 2023-01-22 DIAGNOSIS — Z7682 Awaiting organ transplant status: Secondary | ICD-10-CM | POA: Diagnosis not present

## 2023-01-22 LAB — URINALYSIS, ROUTINE W REFLEX MICROSCOPIC
Glucose, UA: NEGATIVE mg/dL
Ketones, ur: 15 mg/dL — AB
Leukocytes,Ua: NEGATIVE
Nitrite: NEGATIVE
Protein, ur: NEGATIVE mg/dL
Specific Gravity, Urine: 1.01 (ref 1.005–1.030)
pH: 6 (ref 5.0–8.0)

## 2023-01-22 LAB — CBC WITH DIFFERENTIAL/PLATELET
Abs Immature Granulocytes: 0.03 10*3/uL (ref 0.00–0.07)
Basophils Absolute: 0 10*3/uL (ref 0.0–0.1)
Basophils Relative: 0 %
Eosinophils Absolute: 0 10*3/uL (ref 0.0–0.5)
Eosinophils Relative: 0 %
HCT: 45.1 % (ref 39.0–52.0)
Hemoglobin: 14.2 g/dL (ref 13.0–17.0)
Immature Granulocytes: 0 %
Lymphocytes Relative: 12 %
Lymphs Abs: 0.8 10*3/uL (ref 0.7–4.0)
MCH: 32.6 pg (ref 26.0–34.0)
MCHC: 31.5 g/dL (ref 30.0–36.0)
MCV: 103.7 fL — ABNORMAL HIGH (ref 80.0–100.0)
Monocytes Absolute: 0.8 10*3/uL (ref 0.1–1.0)
Monocytes Relative: 12 %
Neutro Abs: 5.2 10*3/uL (ref 1.7–7.7)
Neutrophils Relative %: 76 %
Platelets: 235 10*3/uL (ref 150–400)
RBC: 4.35 MIL/uL (ref 4.22–5.81)
RDW: 13.5 % (ref 11.5–15.5)
WBC: 6.9 10*3/uL (ref 4.0–10.5)
nRBC: 0 % (ref 0.0–0.2)

## 2023-01-22 LAB — COMPREHENSIVE METABOLIC PANEL
ALT: 31 U/L (ref 0–44)
AST: 46 U/L — ABNORMAL HIGH (ref 15–41)
Albumin: 3.4 g/dL — ABNORMAL LOW (ref 3.5–5.0)
Alkaline Phosphatase: 91 U/L (ref 38–126)
Anion gap: 18 — ABNORMAL HIGH (ref 5–15)
BUN: 64 mg/dL — ABNORMAL HIGH (ref 8–23)
CO2: 22 mmol/L (ref 22–32)
Calcium: 9.9 mg/dL (ref 8.9–10.3)
Chloride: 104 mmol/L (ref 98–111)
Creatinine, Ser: 5.65 mg/dL — ABNORMAL HIGH (ref 0.61–1.24)
GFR, Estimated: 10 mL/min — ABNORMAL LOW (ref >60)
Glucose, Bld: 83 mg/dL (ref 70–99)
Potassium: 4.8 mmol/L (ref 3.5–5.1)
Sodium: 144 mmol/L (ref 135–145)
Total Bilirubin: 1.4 mg/dL — ABNORMAL HIGH (ref 0.3–1.2)
Total Protein: 7.6 g/dL (ref 6.5–8.1)

## 2023-01-22 LAB — RAPID URINE DRUG SCREEN, HOSP PERFORMED
Amphetamines: NOT DETECTED
Barbiturates: NOT DETECTED
Benzodiazepines: NOT DETECTED
Cocaine: NOT DETECTED
Opiates: NOT DETECTED
Tetrahydrocannabinol: NOT DETECTED

## 2023-01-22 LAB — CK: Total CK: 208 U/L (ref 49–397)

## 2023-01-22 LAB — URINALYSIS, MICROSCOPIC (REFLEX)
Bacteria, UA: NONE SEEN
Squamous Epithelial / HPF: NONE SEEN /HPF (ref 0–5)
WBC, UA: NONE SEEN WBC/hpf (ref 0–5)

## 2023-01-22 LAB — LACTIC ACID, PLASMA: Lactic Acid, Venous: 1.9 mmol/L (ref 0.5–1.9)

## 2023-01-22 LAB — PROTIME-INR
INR: 1.1 (ref 0.8–1.2)
Prothrombin Time: 14.5 seconds (ref 11.4–15.2)

## 2023-01-22 LAB — SARS CORONAVIRUS 2 BY RT PCR: SARS Coronavirus 2 by RT PCR: NEGATIVE

## 2023-01-22 LAB — APTT: aPTT: 29 seconds (ref 24–36)

## 2023-01-22 MED ORDER — SULFAMETHOXAZOLE-TRIMETHOPRIM 400-80 MG PO TABS
1.0000 | ORAL_TABLET | ORAL | Status: DC
  Administered 2023-01-24 – 2023-01-26 (×2): 1 via ORAL
  Filled 2023-01-22 (×2): qty 1

## 2023-01-22 MED ORDER — FOLIC ACID 1 MG PO TABS
1.0000 mg | ORAL_TABLET | Freq: Every day | ORAL | Status: DC
  Administered 2023-01-22: 1 mg via ORAL
  Filled 2023-01-22: qty 1

## 2023-01-22 MED ORDER — SODIUM BICARBONATE 650 MG PO TABS
1300.0000 mg | ORAL_TABLET | Freq: Two times a day (BID) | ORAL | Status: DC
  Administered 2023-01-22 – 2023-01-24 (×5): 1300 mg via ORAL
  Filled 2023-01-22 (×5): qty 2

## 2023-01-22 MED ORDER — PANTOPRAZOLE SODIUM 40 MG PO TBEC
40.0000 mg | DELAYED_RELEASE_TABLET | Freq: Every day | ORAL | Status: DC
  Administered 2023-01-23 – 2023-01-27 (×5): 40 mg via ORAL
  Filled 2023-01-22 (×5): qty 1

## 2023-01-22 MED ORDER — ACETAMINOPHEN 650 MG RE SUPP: 650.0000 mg | Freq: Four times a day (QID) | RECTAL | Status: DC | PRN

## 2023-01-22 MED ORDER — METOPROLOL TARTRATE 50 MG PO TABS
50.0000 mg | ORAL_TABLET | Freq: Two times a day (BID) | ORAL | Status: DC
  Administered 2023-01-22 – 2023-01-27 (×10): 50 mg via ORAL
  Filled 2023-01-22 (×11): qty 1

## 2023-01-22 MED ORDER — SODIUM CHLORIDE 0.45 % IV SOLN: INTRAVENOUS | Status: DC

## 2023-01-22 MED ORDER — ONDANSETRON 4 MG PO TBDP
4.0000 mg | ORAL_TABLET | Freq: Three times a day (TID) | ORAL | Status: DC | PRN
  Administered 2023-01-23: 4 mg via ORAL
  Filled 2023-01-22: qty 1

## 2023-01-22 MED ORDER — SODIUM CHLORIDE 0.9 % IV BOLUS
1000.0000 mL | Freq: Once | INTRAVENOUS | Status: AC
  Administered 2023-01-22: 1000 mL via INTRAVENOUS

## 2023-01-22 MED ORDER — L-METHYLFOLATE-B6-B12 3-35-2 MG PO TABS
1.0000 | ORAL_TABLET | Freq: Every day | ORAL | Status: DC
  Administered 2023-01-22 – 2023-01-27 (×6): 1 via ORAL
  Filled 2023-01-22 (×6): qty 1

## 2023-01-22 MED ORDER — TACROLIMUS 1 MG PO CAPS
1.0000 mg | ORAL_CAPSULE | Freq: Two times a day (BID) | ORAL | Status: DC
  Administered 2023-01-22 – 2023-01-27 (×10): 1 mg via ORAL
  Filled 2023-01-22 (×10): qty 1

## 2023-01-22 MED ORDER — LORAZEPAM 0.5 MG PO TABS
0.5000 mg | ORAL_TABLET | ORAL | Status: AC | PRN
  Administered 2023-01-23 – 2023-01-24 (×3): 0.5 mg via ORAL
  Filled 2023-01-22 (×4): qty 1

## 2023-01-22 MED ORDER — SENNA 8.6 MG PO TABS
1.0000 | ORAL_TABLET | Freq: Two times a day (BID) | ORAL | Status: DC
  Administered 2023-01-22 – 2023-01-27 (×8): 8.6 mg via ORAL
  Filled 2023-01-22 (×10): qty 1

## 2023-01-22 MED ORDER — HYDROMORPHONE HCL 1 MG/ML IJ SOLN: 0.5000 mg | INTRAMUSCULAR | Status: DC | PRN

## 2023-01-22 MED ORDER — SODIUM CHLORIDE 0.9 % IV SOLN
1.0000 g | INTRAVENOUS | Status: DC
  Filled 2023-01-22: qty 10

## 2023-01-22 MED ORDER — THIAMINE HCL 100 MG/ML IJ SOLN
100.0000 mg | Freq: Every day | INTRAMUSCULAR | Status: DC
  Administered 2023-01-22: 100 mg via INTRAVENOUS
  Filled 2023-01-22 (×2): qty 2

## 2023-01-22 MED ORDER — SODIUM CHLORIDE 0.9 % IV SOLN
2.0000 g | Freq: Once | INTRAVENOUS | Status: AC
  Administered 2023-01-22: 2 g via INTRAVENOUS
  Filled 2023-01-22: qty 12.5

## 2023-01-22 MED ORDER — AMLODIPINE BESYLATE 5 MG PO TABS
5.0000 mg | ORAL_TABLET | Freq: Every day | ORAL | Status: DC
  Administered 2023-01-22: 5 mg via ORAL
  Filled 2023-01-22: qty 1

## 2023-01-22 MED ORDER — VANCOMYCIN VARIABLE DOSE PER UNSTABLE RENAL FUNCTION (PHARMACIST DOSING): Status: DC

## 2023-01-22 MED ORDER — MAGNESIUM OXIDE -MG SUPPLEMENT 400 (240 MG) MG PO TABS: 400.0000 mg | ORAL_TABLET | Freq: Three times a day (TID) | ORAL | Status: DC | PRN

## 2023-01-22 MED ORDER — THIAMINE MONONITRATE 100 MG PO TABS
100.0000 mg | ORAL_TABLET | Freq: Every day | ORAL | Status: DC
  Administered 2023-01-23 – 2023-01-27 (×5): 100 mg via ORAL
  Filled 2023-01-22 (×6): qty 1

## 2023-01-22 MED ORDER — FERROUS SULFATE 325 (65 FE) MG PO TABS
325.0000 mg | ORAL_TABLET | Freq: Every day | ORAL | Status: DC
  Administered 2023-01-23 – 2023-01-27 (×5): 325 mg via ORAL
  Filled 2023-01-22 (×5): qty 1

## 2023-01-22 MED ORDER — DILTIAZEM HCL 25 MG/5ML IV SOLN
5.0000 mg | Freq: Once | INTRAVENOUS | Status: AC
  Administered 2023-01-22: 5 mg via INTRAVENOUS
  Filled 2023-01-22: qty 5

## 2023-01-22 MED ORDER — OXYCODONE HCL 5 MG PO TABS: 5.0000 mg | ORAL_TABLET | Freq: Four times a day (QID) | ORAL | Status: DC | PRN

## 2023-01-22 MED ORDER — HEPARIN SODIUM (PORCINE) 5000 UNIT/ML IJ SOLN
5000.0000 [IU] | Freq: Three times a day (TID) | INTRAMUSCULAR | Status: DC
  Administered 2023-01-22 – 2023-01-27 (×16): 5000 [IU] via SUBCUTANEOUS
  Filled 2023-01-22 (×16): qty 1

## 2023-01-22 MED ORDER — DILTIAZEM HCL 30 MG PO TABS
60.0000 mg | ORAL_TABLET | Freq: Three times a day (TID) | ORAL | Status: DC
  Administered 2023-01-22 – 2023-01-23 (×3): 60 mg via ORAL
  Filled 2023-01-22: qty 2
  Filled 2023-01-22 (×3): qty 1
  Filled 2023-01-22: qty 2

## 2023-01-22 MED ORDER — FLUDROCORTISONE ACETATE 0.1 MG PO TABS
0.1000 mg | ORAL_TABLET | Freq: Every day | ORAL | Status: DC
  Administered 2023-01-22 – 2023-01-27 (×6): 0.1 mg via ORAL
  Filled 2023-01-22 (×6): qty 1

## 2023-01-22 MED ORDER — VANCOMYCIN HCL 1500 MG/300ML IV SOLN
1500.0000 mg | Freq: Once | INTRAVENOUS | Status: AC
  Administered 2023-01-22: 1500 mg via INTRAVENOUS
  Filled 2023-01-22: qty 300

## 2023-01-22 MED ORDER — ACETAMINOPHEN 325 MG PO TABS
650.0000 mg | ORAL_TABLET | Freq: Four times a day (QID) | ORAL | Status: DC | PRN
  Administered 2023-01-23: 650 mg via ORAL
  Filled 2023-01-22: qty 2

## 2023-01-22 MED ORDER — CYCLOBENZAPRINE HCL 10 MG PO TABS: 10.0000 mg | ORAL_TABLET | Freq: Two times a day (BID) | ORAL | Status: DC | PRN

## 2023-01-22 MED ORDER — LORAZEPAM 1 MG PO TABS
1.0000 mg | ORAL_TABLET | ORAL | Status: AC | PRN
  Administered 2023-01-22 – 2023-01-23 (×2): 1 mg via ORAL
  Filled 2023-01-22: qty 2
  Filled 2023-01-22: qty 1

## 2023-01-22 MED ORDER — ADULT MULTIVITAMIN W/MINERALS CH
1.0000 | ORAL_TABLET | Freq: Every day | ORAL | Status: DC
  Administered 2023-01-22 – 2023-01-27 (×6): 1 via ORAL
  Filled 2023-01-22 (×6): qty 1

## 2023-01-22 MED ORDER — TACROLIMUS 1 MG PO CAPS: 5.0000 mg | ORAL_CAPSULE | Freq: Two times a day (BID) | ORAL | Status: DC

## 2023-01-22 NOTE — Assessment & Plan Note (Signed)
Baseline Cr 2.8. Has been followed at [facility] and has been evaluated for transplant but has not been listed.  Plan Avoid nephrotoxic medications

## 2023-01-22 NOTE — Progress Notes (Signed)
Orthopedic Tech Progress Note Patient Details:  [name] 1950-11-08 [id number]  Ortho Devices Type of Ortho Device: Thoracolumbar corset (TLSO) Ortho Device/Splint Interventions: Application   Post Interventions Patient Tolerated: Well Instructions Provided: Adjustment of device  Feiga Nadel E Najma Bozarth 01/22/2023, 3:03 PM

## 2023-01-22 NOTE — ED Provider Notes (Signed)
[place] EMERGENCY DEPARTMENT AT [facility] Provider Note   CSN: [id number] Arrival date & time: 01/22/23  1057     History  Chief Complaint  Patient presents with   Marletta Lor    [name] is a 72 y.o. male.   Fall   72 year old male presents emergency department after being found on the ground.  Patient with chronic alcohol abuse and states that he was drinking alcohol and fell to the ground 3 days ago.  Patient unaware events around fall but states it was unable to get up by himself.  He lives by himself.  Neighbor called wife for check yesterday but when police showed up, no one answered the door so they left.  EMS found patient today on the ground facedown.  Patient unable to detail events from fall or whether or not he lost consciousness.  States that he is remained in the same position for the past 3 days has had nothing to drink or eat since fall.  Patient currently complaining of left arm and leg weakness, right shoulder pain as well as generalized headache.  Currently denying chest pain, shortness of breath, abdominal pain, nausea, vomiting, urinary symptoms, change in bowel habits.  Past medical history significant for hepatic encephalopathy, end-stage hepatic failure with liver transplant, hypertension, alcohol abuse  Home Medications Prior to Admission medications   Medication Sig Start Date End Date Taking? Authorizing Provider  amLODipine (NORVASC) 5 MG tablet TAKE ONE TABLET BY MOUTH EVERY DAY 04/01/12  Yes Newt Lukes, MD  Ascorbic Acid (VITAMIN C) 500 MG tablet Take 500 mg by mouth daily.     Yes [provider]  Calcium-Magnesium-Vitamin D (CITRACAL CALCIUM+D PO) Take 2 tablets by mouth 2 (two) times daily.    Yes [provider]  cyclobenzaprine (FLEXERIL) 10 MG tablet Take 1 tablet (10 mg total) by mouth 2 (two) times daily as needed for muscle spasms. 03/14/15  Yes Vanetta Mulders, MD  ferrous sulfate 325 (65 FE) MG tablet  Take 325 mg by mouth daily with breakfast.     [provider]  fludrocortisone (FLORINEF) 0.1 MG tablet Take 0.1 mg by mouth daily.    [provider]  hydrochlorothiazide (HYDRODIURIL) 25 MG tablet Take 12.5 mg by mouth daily.     [provider]  Magnesium Oxide 400 MG CAPS Take 400 mg by mouth 3 (three) times daily as needed (cramps).     [provider]  methocarbamol (ROBAXIN) 500 MG tablet Take 1 tablet (500 mg total) by mouth 4 (four) times daily. 03/10/15   Raeford Razor, MD  metoprolol (LOPRESSOR) 50 MG tablet Take 1 tablet (50 mg total) by mouth 2 (two) times daily. 05/04/11   Newt Lukes, MD  Multiple Vitamin (MULTIVITAMIN) tablet Take 1 tablet by mouth daily.      [provider]  omeprazole (PRILOSEC) 20 MG capsule Take 1 capsule (20 mg total) by mouth daily. 03/12/11 03/14/15  Newt Lukes, MD  ondansetron (ZOFRAN ODT) 4 MG disintegrating tablet Take 1 tablet (4 mg total) by mouth every 8 (eight) hours as needed for nausea or vomiting. 03/14/15   Vanetta Mulders, MD  oxyCODONE (ROXICODONE) 5 MG immediate release tablet Take 1 tablet (5 mg total) by mouth every 6 (six) hours as needed for severe pain. 01/08/22   Placido Sou, PA-C  sodium bicarbonate 650 MG tablet Take 1,300 mg by mouth 2 (two) times daily.     [provider]  sulfamethoxazole-trimethoprim Soyla Dryer)  400-80 MG per tablet Take 1 tablet by mouth every Monday, Wednesday, and Friday. continuous    [provider]  tacrolimus (PROGRAF) 5 MG capsule Take 5-10 mg by mouth 2 (two) times daily. Takes 2 in the morning and 1 at night 03/12/11   Newt Lukes, MD      Allergies    Moxifloxacin    Review of Systems   Review of Systems  All other systems reviewed and are negative.   Physical Exam Updated Vital Signs BP 115/81 (BP Location: Left Arm)   Pulse (!) 107   Temp 97.8 F (36.6 C) (Oral)   Resp 18   Ht 5\' 8"  (1.727 m)   Wt 62.5  kg   SpO2 93%   BMI 20.95 kg/m  Physical Exam Vitals and nursing note reviewed.  Constitutional:      General: He is not in acute distress.    Appearance: He is well-developed.     Comments: Patient easily arousable and responds to questions but with difficulty remembering to be on the past 2 to 3 days.  Even information shared about the past 2 or 3 days is very nondescriptive/detail orientated  HENT:     Head: Normocephalic.     Comments: Patient with swelling noted around right orbit with difficulty opening right eye.  Swelling and abrasions noted bilaterally maxillary area.  Very dry mucous membranes    Mouth/Throat:     Mouth: Mucous membranes are dry.     Comments: Patient with extremely dry mucous membranes Eyes:     Extraocular Movements: Extraocular movements intact.     Conjunctiva/sclera: Conjunctivae normal.     Pupils: Pupils are equal, round, and reactive to light.  Cardiovascular:     Rate and Rhythm: Normal rate and regular rhythm.     Heart sounds: No murmur heard. Pulmonary:     Effort: Pulmonary effort is normal. No respiratory distress.     Breath sounds: Normal breath sounds.  Abdominal:     Palpations: Abdomen is soft.     Tenderness: There is no abdominal tenderness. There is no guarding.  Musculoskeletal:        General: No swelling.     Cervical back: Neck supple.     Comments: No midline tenderness of cervical or thoracic spine.  Mild midline tenderness to palpation of lower lumbar spine.  No obvious step-off or deformity.  No chest wall tenderness to palpation.  Tender to palpation of right shoulder but otherwise, upper extremities without tenderness to palpation.  No tenderness to palpation of lower extremities.  Pedal and radial pulses 2+ bilaterally.  No sensory deficits along major nerve distributions of upper or lower extremities.    Skin:    General: Skin is warm and dry.     Capillary Refill: Capillary refill takes less than 2 seconds.      Comments: Patient with evidence of multiple areas of skin breakdown diffusely on anterior aspect of body.  Patient with erythema and swelling of bilateral lower rib cage, bilateral dorsal feet.  Neurological:     Mental Status: He is alert.     Comments: Alert and oriented to self  Speech is with some evidence of dysarthria  Strength symmetric but weak in upper/lower extremities   Sensation intact in upper/lower extremities   Gait not assessed  CN I not tested  CN II not tested CN III, IV, VI PERRLA and EOMs intact bilaterally  CN V Intact sensation to sharp and light  touch to the face  CN VII facial movements symmetric  CN VIII not tested  CN IX, X no uvula deviation, symmetric rise of soft palate  CN XI 5/5 SCM and trapezius strength bilaterally  CN XII Midline tongue protrusion, symmetric L/R movements     Psychiatric:        Mood and Affect: Mood normal.     ED Results / Procedures / Treatments   Labs (all labs ordered are listed, but only abnormal results are displayed) Labs Reviewed  COMPREHENSIVE METABOLIC PANEL - Abnormal; Notable for the following components:      Result Value   BUN 64 (*)    Creatinine, Ser 5.65 (*)    Albumin 3.4 (*)    AST 46 (*)    Total Bilirubin 1.4 (*)    GFR, Estimated 10 (*)    Anion gap 18 (*)    All other components within normal limits  CBC WITH DIFFERENTIAL/PLATELET - Abnormal; Notable for the following components:   MCV 103.7 (*)    All other components within normal limits  URINALYSIS, ROUTINE W REFLEX MICROSCOPIC - Abnormal; Notable for the following components:   Hgb urine dipstick SMALL (*)    Bilirubin Urine SMALL (*)    Ketones, ur 15 (*)    All other components within normal limits  SARS CORONAVIRUS 2 BY RT PCR  CULTURE, BLOOD (ROUTINE X 2)  CULTURE, BLOOD (ROUTINE X 2)  CK  LACTIC ACID, PLASMA  RAPID URINE DRUG SCREEN, HOSP PERFORMED  PROTIME-INR  APTT  URINALYSIS, MICROSCOPIC (REFLEX)  BASIC METABOLIC PANEL   CBC  HIV ANTIBODY (ROUTINE TESTING W REFLEX)    EKG EKG Interpretation  Date/Time:  Saturday Jan 22 2023 13:56:42 EDT Ventricular Rate:  163 PR Interval:  184 QRS Duration: 102 QT Interval:  277 QTC Calculation: 457 R Axis:   91 Text Interpretation: Supraventricular tachycardia Probable anterior infarct, age indeterminate Artifact in lead(s) I II aVR aVL Confirmed by Kristine Royal [phone]) on 01/22/2023 1:59:19 PM  Radiology CT L-SPINE NO CHARGE  Result Date: 01/22/2023 CLINICAL DATA:  Patient found down.  Last seen 3 days ago. EXAM: CT LUMBAR SPINE WITHOUT CONTRAST TECHNIQUE: Multidetector CT imaging of the lumbar spine was performed without intravenous contrast administration. Multiplanar CT image reconstructions were also generated. RADIATION DOSE REDUCTION: This exam was performed according to the departmental dose-optimization program which includes automated exposure control, adjustment of the mA and/or kV according to patient size and/or use of iterative reconstruction technique. COMPARISON:  CT of the lumbar spine 4/5/3. FINDINGS: Segmentation: 5 non rib-bearing lumbar type vertebral bodies are present. The lowest fully formed vertebral body is L5. Alignment: Slight degenerative anterolisthesis L4-5 is stable. Other significant listhesis is present. Rightward curvature is centered at L4-5. Leftward curvature is centered at L5-S1 Vertebrae: Progressive vertebral plana compression fractures present at L1. Endplate sclerotic changes are worse on the left at L4-5 and right at vertebral heights are maintained. Paraspinal and other soft tissues: Atherosclerotic calcifications are present the aorta branch no significant adenopathy is present. The paraspinous musculature is atrophic. Disc levels: L1-2: Facet hypertrophy again noted. Disc protrusion extends into the foramina bilaterally. Mild bilateral foraminal stenosis is present. L2-3: Mild facet hypertrophy is present. No significant  protrusion or stenosis is present. L3-4: Mild broad-based disc bulge is present. Facet hypertrophy is noted bilaterally. Mild foraminal narrowing is worse on the right. L4-5: Endplate sclerotic changes worse on left. Facet spurring is worse on the right. Moderate foraminal stenosis is present  bilaterally. Past wrapped disease is noted. L5-S1: Asymmetric right-sided facet hypertrophy and spurring is present. A rightward disc protrusion is present. This results in moderate right foraminal stenosis. Mild left foraminal narrowing is present. IMPRESSION: 1. Progressive vertebral plana compression fracture at L1. 2. No new fractures. 3. Multilevel spondylosis of the lumbar spine as described. 4. Mild bilateral foraminal stenosis at L1-2 and L3-4 is worse on the right. 5. Moderate foraminal stenosis bilaterally at L4-5 is worse on the right. 6. Moderate right and mild left foraminal stenosis at L5-S1. 7.  Aortic Atherosclerosis (ICD10-I70.0). Electronically Signed   By: Marin Roberts M.D.   On: 01/22/2023 13:51   CT CHEST ABDOMEN PELVIS WO CONTRAST  Result Date: 01/22/2023 CLINICAL DATA:  Polytrauma, blunt.  Fall. EXAM: CT CHEST, ABDOMEN AND PELVIS WITHOUT CONTRAST TECHNIQUE: Multidetector CT imaging of the chest, abdomen and pelvis was performed following the standard protocol without IV contrast. RADIATION DOSE REDUCTION: This exam was performed according to the departmental dose-optimization program which includes automated exposure control, adjustment of the mA and/or kV according to patient size and/or use of iterative reconstruction technique. COMPARISON:  09/29/2011 abdominal CT. Thoracic and lumbar spine CTs 12/29/2021 FINDINGS: CT CHEST FINDINGS Cardiovascular: Heart is borderline in size. Aorta normal caliber. Scattered coronary artery and aortic calcifications. Mediastinum/Nodes: No mediastinal, hilar, or axillary adenopathy. Trachea and esophagus are unremarkable. Lungs/Pleura: Linear areas of  scarring in the lower lobes bilaterally. No effusions or pneumothorax Musculoskeletal: Chronic compression fractures noted at T9 and T5. Mild compression fracture through the superior endplate of T3 age-indeterminate, but new since 12/29/2021. CT ABDOMEN PELVIS FINDINGS Hepatobiliary: No hepatic injury or perihepatic hematoma. Prior cholecystectomy. Pneumobilia noted. Pancreas: No focal abnormality or ductal dilatation. Spleen: No splenic injury or perisplenic hematoma. Adrenals/Urinary Tract: No adrenal hemorrhage or renal injury identified. Bladder is unremarkable. No hydronephrosis. Stomach/Bowel: Prior hemicolectomy.  No bowel obstruction. Vascular/Lymphatic: Aortic atherosclerosis. No evidence of aneurysm or adenopathy. Reproductive: No visible focal abnormality. Other: No free fluid or free air. Musculoskeletal: Chronic compression fracture at L1, progressed significantly since 12/29/2021. Degenerative disc disease most pronounced at L4-5. Diffuse degenerative facet disease. IMPRESSION: Chronic compression fractures at T5, T9 and L1. L1 compression fracture has progressed since prior study. Age indeterminate mild compression fracture through the superior endplate of T3, new since 12/29/2021. Otherwise, no acute finding or significant traumatic injury in the chest, abdomen or pelvis. Bibasilar scarring. Coronary artery disease, aortic atherosclerosis. Electronically Signed   By: Charlett Nose M.D.   On: 01/22/2023 13:45   CT T-SPINE NO CHARGE  Result Date: 01/22/2023 CLINICAL DATA:  Fall.  Patient found down yesterday. EXAM: CT THORACIC SPINE WITHOUT CONTRAST TECHNIQUE: Multidetector CT images of the thoracic were obtained using the standard protocol without intravenous contrast. RADIATION DOSE REDUCTION: This exam was performed according to the departmental dose-optimization program which includes automated exposure control, adjustment of the mA and/or kV according to patient size and/or use of iterative  reconstruction technique. COMPARISON:  CT of the thoracic spine 12/29/2021 FINDINGS: Alignment: No significant ceases is present. Mild leftward curvature is present in the midthoracic spine. Vertebrae: Acute superior endplate fracture at T3 demonstrates 30% loss height without retropulsed bone. Remote superior endplate T5 fracture is present. Remote superior endplate T9 fracture is present. Schmorl's nodes involving the superior endplate of T8 both endplates at T8-9 in the inferior endplate of T9 have progressed. Schmorl's node the superior endplate of T11 associated remote superior endplate fracture is stable. L1 vertebral plana compression fracture has progressed Paraspinal and  other soft tissues: Lungs are clear. Areas of atelectasis or scarring at the left lung base are similar to the prior study. Lungs are otherwise unremarkable. Disc levels: Significant focal disc disease or stenosis is present. IMPRESSION: 1. Acute superior endplate fracture at T3 with 30% loss height without retropulsed bone. 2. Remote superior endplate fractures at T5, T9, and T11. 3. Progressive vertebral plana compression fracture at L1. 4. Progressive Schmorl's nodes involving the superior endplate of T8 both endplates at T8-9 in the inferior endplate of T9. 5. Mild leftward curvature of the midthoracic spine. 6. Areas of atelectasis or scarring at the left lung base are similar to the prior study. Electronically Signed   By: Marin Roberts M.D.   On: 01/22/2023 13:44   DG Shoulder Right  Result Date: 01/22/2023 CLINICAL DATA:  Fall.  Pain. EXAM: RIGHT SHOULDER - 2+ VIEW COMPARISON:  None Available. FINDINGS: There is no evidence of fracture or dislocation. There is no evidence of arthropathy or other focal bone abnormality. Soft tissues are unremarkable. IMPRESSION: Negative right shoulder radiographs. Electronically Signed   By: Marin Roberts M.D.   On: 01/22/2023 13:37   DG Knee Complete 4 Views Left  Result Date:  01/22/2023 CLINICAL DATA:  Left knee pain.  Fall. EXAM: LEFT KNEE - COMPLETE 4+ VIEW COMPARISON:  None Available. FINDINGS: No evidence of fracture, dislocation, or joint effusion. No evidence of arthropathy or other focal bone abnormality. Soft tissues are unremarkable. IMPRESSION: Negative left knee radiographs. Electronically Signed   By: Marin Roberts M.D.   On: 01/22/2023 13:36   CT Maxillofacial Wo Contrast  Result Date: 01/22/2023 CLINICAL DATA:  Facial trauma, blunt.  Fall. EXAM: CT MAXILLOFACIAL WITHOUT CONTRAST TECHNIQUE: Multidetector CT imaging of the maxillofacial structures was performed. Multiplanar CT image reconstructions were also generated. RADIATION DOSE REDUCTION: This exam was performed according to the departmental dose-optimization program which includes automated exposure control, adjustment of the mA and/or kV according to patient size and/or use of iterative reconstruction technique. COMPARISON:  None Available. FINDINGS: Osseous: No fracture or mandibular dislocation. No destructive process. Orbits: Negative. No traumatic or inflammatory finding. Sinuses: No air-fluid levels. Soft tissues: Soft tissue swelling over the right orbit and upper face. Limited intracranial: See head CT report IMPRESSION: Soft tissue swelling over the right orbit and upper face. No facial or orbital fracture. Electronically Signed   By: Charlett Nose M.D.   On: 01/22/2023 13:28   CT Cervical Spine Wo Contrast  Result Date: 01/22/2023 CLINICAL DATA:  Neck trauma (Age >= 65y), fall EXAM: CT CERVICAL SPINE WITHOUT CONTRAST TECHNIQUE: Multidetector CT imaging of the cervical spine was performed without intravenous contrast. Multiplanar CT image reconstructions were also generated. RADIATION DOSE REDUCTION: This exam was performed according to the departmental dose-optimization program which includes automated exposure control, adjustment of the mA and/or kV according to patient size and/or use of  iterative reconstruction technique. COMPARISON:  None Available. FINDINGS: Alignment: No subluxation Skull base and vertebrae: No cervical spine fracture or focal bone lesion. There appears to be mild compression fracture through the superior endplate of T3, partially imaged on this cervical spine series. See further discussion of the thoracic spine series. Soft tissues and spinal canal: No prevertebral fluid or swelling. No visible canal hematoma. Disc levels: Diffuse degenerative disc disease with disc space narrowing and spurring. Mild degenerative facet disease bilaterally. Upper chest: No acute findings Other: None IMPRESSION: Apparent mild compression fracture through the superior endplate of T3, partially visualized on this cervical spine  study. See thoracic spine study for further discussion. No acute cervical spine abnormality. Spondylosis. Electronically Signed   By: Charlett Nose M.D.   On: 01/22/2023 13:26   CT Head Wo Contrast  Result Date: 01/22/2023 CLINICAL DATA:  Head trauma, minor (Age >= 65y).  Fall. EXAM: CT HEAD WITHOUT CONTRAST TECHNIQUE: Contiguous axial images were obtained from the base of the skull through the vertex without intravenous contrast. RADIATION DOSE REDUCTION: This exam was performed according to the departmental dose-optimization program which includes automated exposure control, adjustment of the mA and/or kV according to patient size and/or use of iterative reconstruction technique. COMPARISON:  11/05/2010 FINDINGS: Brain: There is atrophy and chronic small vessel disease changes. No acute intracranial abnormality. Specifically, no hemorrhage, hydrocephalus, mass lesion, acute infarction, or significant intracranial injury. Vascular: No hyperdense vessel or unexpected calcification. Skull: No acute calvarial abnormality. Sinuses/Orbits: Soft tissue swelling over the right orbit. No visible fracture. Paranasal sinuses clear. Other: None IMPRESSION: Atrophy, chronic  microvascular disease. No acute intracranial abnormality. Electronically Signed   By: Charlett Nose M.D.   On: 01/22/2023 13:23    Procedures Procedures    Medications Ordered in ED Medications  vancomycin variable dose per unstable renal function (pharmacist dosing) (has no administration in time range)  ceFEPIme (MAXIPIME) 1 g in sodium chloride 0.9 % 100 mL IVPB (has no administration in time range)  sulfamethoxazole-trimethoprim (BACTRIM) 400-80 MG per tablet 1 tablet (has no administration in time range)  metoprolol tartrate (LOPRESSOR) tablet 50 mg (has no administration in time range)  fludrocortisone (FLORINEF) tablet 0.1 mg (0.1 mg Oral Given 01/22/23 1653)  magnesium oxide (MAG-OX) tablet 400 mg (has no administration in time range)  pantoprazole (PROTONIX) EC tablet 40 mg (has no administration in time range)  ondansetron (ZOFRAN-ODT) disintegrating tablet 4 mg (has no administration in time range)  sodium bicarbonate tablet 1,300 mg (has no administration in time range)  ferrous sulfate tablet 325 mg (has no administration in time range)  cyclobenzaprine (FLEXERIL) tablet 10 mg (has no administration in time range)  heparin injection 5,000 Units (5,000 Units Subcutaneous Given 01/22/23 1655)  0.45 % sodium chloride infusion ( Intravenous New Bag/Given 01/22/23 1855)  acetaminophen (TYLENOL) tablet 650 mg (has no administration in time range)    Or  acetaminophen (TYLENOL) suppository 650 mg (has no administration in time range)  HYDROmorphone (DILAUDID) injection 0.5-1 mg (has no administration in time range)  senna (SENOKOT) tablet 8.6 mg (has no administration in time range)  LORazepam (ATIVAN) tablet 1-4 mg (1 mg Oral Given 01/22/23 1950)    Or  LORazepam (ATIVAN) tablet 0.5 mg ( Oral See Alternative 01/22/23 1950)  thiamine (VITAMIN B1) tablet 100 mg ( Oral See Alternative 01/22/23 1654)    Or  thiamine (VITAMIN B1) injection 100 mg (100 mg Intravenous Given 01/22/23 1654)   multivitamin with minerals tablet 1 tablet (1 tablet Oral Given 01/22/23 1653)  tacrolimus (PROGRAF) capsule 1 mg (1 mg Oral Given 01/22/23 1849)  l-methylfolate-B6-B12 (METANX) 3-35-2 MG per tablet 1 tablet (1 tablet Oral Given 01/22/23 1849)  diltiazem (CARDIZEM) tablet 60 mg (60 mg Oral Given 01/22/23 1849)  sodium chloride 0.9 % bolus 1,000 mL (0 mLs Intravenous Stopped 01/22/23 1352)  vancomycin (VANCOREADY) IVPB 1500 mg/300 mL (0 mg Intravenous Stopped 01/22/23 1558)  ceFEPIme (MAXIPIME) 2 g in sodium chloride 0.9 % 100 mL IVPB (0 g Intravenous Stopped 01/22/23 1404)  sodium chloride 0.9 % bolus 1,000 mL (0 mLs Intravenous Stopped 01/22/23 1558)  diltiazem (CARDIZEM)  injection 5 mg (5 mg Intravenous Given 01/22/23 1411)    ED Course/ Medical Decision Making/ A&P Clinical Course as of 01/22/23 2024  Sat Jan 22, 2023  2023 Consult hospitalist Dr. Debby Bud who agreed with admission and seen further treatment/care. [CR]    Clinical Course User Index [CR] Peter Garter, PA                             Medical Decision Making Amount and/or Complexity of Data Reviewed Labs: ordered. Radiology: ordered.  Risk Prescription drug management. Decision regarding hospitalization.   This patient presents to the ED for concern of fall, this involves an extensive number of treatment options, and is a complaint that carries with it a high risk of complications and morbidity.  The differential diagnosis includes myelolysis, sepsis, alcohol withdrawal   Co morbidities that complicate the patient evaluation  See HPI   Additional history obtained:  Additional history obtained from EMR External records from outside source obtained and reviewed including hospital records   Lab Tests:  I Ordered, and personally interpreted labs.  The pertinent results include: No leukocytosis.  Evidence of anemia.  Platelets within normal range.  No electrolyte abnormalities.  Patient with evidence of AKI with  creatinine of 5.65, BUN of 6 4 and GFR of 10.  Patient with evidence of anion gap of 18.  CK within normal limits.  aPTT and PT/INR within normal limits.  Lactic acid within normal limits.  Blood cultures pending.  COVID is negative.  UA significant for 15 ketones, small hemoglobin, small bilirubin otherwise unremarkable.  UDS negative.  Ethanol and ammonia pending.  She states   Imaging Studies ordered:  I ordered imaging studies including CT head/C-spine, CT chest abdomen pelvis, CT T/L-spine, x-ray right shoulder, x-ray left knee I independently visualized and interpreted imaging which showed  CT head/C-spine/maxillofacial: Soft tissue swelling noted over right orbit but no evidence of acute fracture or other abnormality.  No acute fracture cervical spine or subluxation.  No acute abnormalities intracranially.  Atrophy and chronic microvascular disease CT chest abdomen pelvis: Chronic compression fractures of T5, T9 and L1.  New T3 compression fracture.  No other acute traumatic injury.  Bibasilar scarring.  Coronary artery disease, aortic atherosclerosis. CT T and L-spine: Acute superior endplate fracture of T3.  Remote superior endplate fractures of T5, T9, T11.  Progressive vertebral plana compression fracture of L1.  Mild leftward curvature of the thoracic spine.  Progression of L1 fracture.  No other acute abnormalities.  Other degenerative changes as listed in report X-ray right shoulder: No acute abnormalities X-ray left knee: No acute abnormalities I agree with the radiologist interpretation  Cardiac Monitoring: / EKG:  The patient was maintained on a cardiac monitor.  I personally viewed and interpreted the cardiac monitored which showed an underlying rhythm of: Sinus tachycardia with nonspecific T wave changes.   Consultations Obtained:  See ED course  Problem List / ED Course / Critical interventions / Medication management  Fall, AKI, compression fracture of L3 altered  mentation I ordered medication including 2 L normal saline, cefepime, vancomycin, Cardizem   Reevaluation of the patient after these medicines showed that the patient improved I have reviewed the patients home medicines and have made adjustments as needed   Social Determinants of Health:  Chronic alcohol use   Test / Admission - Considered:  Fall, AKI, compression fracture of L3 altered mentation Vitals signs significant for tachycardia  with heart rate in the 120s to 130s. Otherwise within normal range and stable throughout visit. Laboratory/imaging studies significant for: See above 72 year old male presents emergency department after a fall.  Unknown etiology of patient's fall with unclear exact history as to events around fall.  Patient found with significant dehydration.  Unsure whether or not symptoms related to alcohol withdrawal/use.  Regardless, patient found with worsening renal function and evidence of dehydration.  Some suspicion initially for rhabdomyolysis given reported being on the ground 3 days without moving but his CK was within normal limits.  From traumatic workup, only evidence of acute injury was compression fracture of L3 but imaging studies obtained are otherwise unremarkable for acute abnormality.  Given patient's altered mentation, recurrent falls, chronic alcohol use as well as living independently, admission deemed most appropriate. Proper consultations were made in the main emergency department as depicted in ED course.  Treatment plan discussed at length with patient and he acknowledged understanding and was agreeable to said plan.  Patient stable upon admission to hospital.         Final Clinical Impression(s) / ED Diagnoses Final diagnoses:  Fall, initial encounter  AKI (acute kidney injury) (HCC)  Closed compression fracture of L3 lumbar vertebra, initial encounter [facility])    Rx / DC Orders ED Discharge Orders     None         Peter Garter, [place] 01/22/23 2024    Wynetta Fines, MD 01/22/23 2211

## 2023-01-22 NOTE — ED Notes (Signed)
Hold off on 2nd lactic per MD

## 2023-01-22 NOTE — Subjective & Objective (Signed)
Mr. 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 By report he fell at home 3 days PTA and was immobilized. EMS activated and patient transported to [facility] for evaluation. By record review he does have severe spinal stenosis L4,5, advanced spinal stenosis L 3,4 and Cervical spinal stenosis followed at [facility].

## 2023-01-22 NOTE — Assessment & Plan Note (Signed)
Patient had transplant 2004 and 2012. He is followed at [facility] transplant center. Reports adherence to his medical regimen. No evidence of hepatitis or liver failure.  Plan Continue home medications

## 2023-01-22 NOTE — Assessment & Plan Note (Signed)
On exam no obvious displacement. Able to make a fist.  \ Plan Ortho to place hand brace.

## 2023-01-22 NOTE — H&P (Signed)
History and Physical    [name] ZOX:[id number] DOB: Mar 12, 1951 DOA: 01/22/2023  DOS: the patient was seen and examined on 01/22/2023  PCP: Pcp, No   Patient coming from: Home  I have personally briefly reviewed patient's old medical records in [facility] Link  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 By report he fell at home 3 days PTA and was immobilized. EMS activated and patient transported to [facility] for evaluation. By record review he does have severe spinal stenosis L4,5, advanced spinal stenosis L 3,4 and Cervical spinal stenosis followed at [facility].   ED Course: T 99.3  133/83  HR 114 (has bee nup to 130) RR 25. General appearance dissheveled. ZLab: BUN/Cr 64/5.65, CK total 208, Lactic acid 1.9  CBCD nl, INR 1.1, U/A negative UDS negative. CTchest/abd/pelvis - compression fx T5T9L1 probably old, CT C-spine negative for fx. CT head - atrophy noted, no acute findings, no facial bone fx's. X-ray righ hand - nond-displaced fx based 3rd metatarsal. EKG with SVT at 163.   Review of Systems:  Review of Systems  Constitutional:  Negative for chills and fever.  HENT: Negative.    Eyes: Negative.   Respiratory:  Negative for cough, shortness of breath and wheezing.   Cardiovascular:  Negative for chest pain, palpitations and leg swelling.  Gastrointestinal:  Negative for abdominal pain, heartburn, nausea and vomiting.  Genitourinary: Negative.   Musculoskeletal:  Positive for back pain, falls and neck pain.  Skin:        Multiple facial apbrasions  Neurological:  Positive for dizziness, loss of consciousness and weakness.  Endo/Heme/Allergies: Negative.   Psychiatric/Behavioral: Negative.      Past  Medical History:  Diagnosis Date   ALCOHOL ABUSE, HX OF    ANEMIA, IRON DEFICIENCY    DERMATITIS    Hepatic encephalopathy (HCC)    HEPATIC FAILURE, END STAGE 2004   liver transplant 2004; 2nd orthotopic liver transplant 01/25/11 [facility] [phone] dr. Fayrene Fearing dewar   Hypertension    THROMBOCYTOPENIA     Past Surgical History:  Procedure Laterality Date   (L) arm broke     Broke (L) ankle   2001   HERNIA REPAIR     LIVER TRANSPLANT  1st 04/21/03; 2nd 01/25/11   @ [facility] dr. dewar [phone];  [phone]    Soc Hx - poor historian. [place] native. MOved to [place] recently. Lives with girlfriend who is currently in hospital.    reports that he has never smoked. He has never used smokeless tobacco. He reports that he does not drink alcohol and does not use drugs.  Allergies  Allergen Reactions   Moxifloxacin Other (See Comments)    Pain     Family History  Problem Relation Age of Onset   Arthritis Mother    Hypertension Mother    Lung cancer Father    Heart disease Father    Cancer Father        lung cancer   Hypertension Sister     Prior to Admission medications   Medication Sig Start Date End Date Taking? Authorizing Provider  amLODipine (NORVASC) 5 MG tablet TAKE ONE TABLET BY MOUTH EVERY DAY 04/01/12  Yes Newt Lukes, MD  Ascorbic Acid (  VITAMIN C) 500 MG tablet Take 500 mg by mouth daily.     Yes [provider]  Calcium-Magnesium-Vitamin D (CITRACAL CALCIUM+D PO) Take 2 tablets by mouth 2 (two) times daily.    Yes [provider]  cyclobenzaprine (FLEXERIL) 10 MG tablet Take 1 tablet (10 mg total) by mouth 2 (two) times daily as needed for muscle spasms. 03/14/15  Yes Vanetta Mulders, MD  ferrous sulfate 325 (65 FE) MG tablet Take 325 mg by mouth daily with breakfast.     [provider]  fludrocortisone (FLORINEF) 0.1 MG tablet Take 0.1 mg by mouth daily.    [provider]  hydrochlorothiazide  (HYDRODIURIL) 25 MG tablet Take 12.5 mg by mouth daily.     [provider]  Magnesium Oxide 400 MG CAPS Take 400 mg by mouth 3 (three) times daily as needed (cramps).     [provider]  methocarbamol (ROBAXIN) 500 MG tablet Take 1 tablet (500 mg total) by mouth 4 (four) times daily. 03/10/15   Raeford Razor, MD  metoprolol (LOPRESSOR) 50 MG tablet Take 1 tablet (50 mg total) by mouth 2 (two) times daily. 05/04/11   Newt Lukes, MD  Multiple Vitamin (MULTIVITAMIN) tablet Take 1 tablet by mouth daily.      [provider]  omeprazole (PRILOSEC) 20 MG capsule Take 1 capsule (20 mg total) by mouth daily. 03/12/11 03/14/15  Newt Lukes, MD  ondansetron (ZOFRAN ODT) 4 MG disintegrating tablet Take 1 tablet (4 mg total) by mouth every 8 (eight) hours as needed for nausea or vomiting. 03/14/15   Vanetta Mulders, MD  oxyCODONE (ROXICODONE) 5 MG immediate release tablet Take 1 tablet (5 mg total) by mouth every 6 (six) hours as needed for severe pain. 01/08/22   Placido Sou, PA-C  sodium bicarbonate 650 MG tablet Take 1,300 mg by mouth 2 (two) times daily.     [provider]  sulfamethoxazole-trimethoprim (BACTRIM,SEPTRA) 400-80 MG per tablet Take 1 tablet by mouth every Monday, Wednesday, and Friday. continuous    [provider]  tacrolimus (PROGRAF) 5 MG capsule Take 5-10 mg by mouth 2 (two) times daily. Takes 2 in the morning and 1 at night 03/12/11   Newt Lukes, MD    Physical Exam: Vitals:   01/22/23 1417 01/22/23 1418 01/22/23 1419 01/22/23 1430  BP:    133/83  Pulse: (!) 107 (!) 119 (!) 124 (!) 109  Resp: (!) 32 19 20 (!) 25  Temp: 99.7 F (37.6 C) 99.7 F (37.6 C) 99.7 F (37.6 C) 99.3 F (37.4 C)  TempSrc:      SpO2: 99% 91% 97% 97%  Weight:      Height:        Physical Exam Vitals and nursing note reviewed.  Constitutional:      Comments: Groggy but able to answer question. Very disheveled. Very bruised with  multiple abrasions  HENT:     Head:     Comments: Temporal wasting noted. Facial bruising and tenderness to palpation around the right orbit.    Nose:     Comments: No deviation of septum    Mouth/Throat:     Comments: Missing most of his teeth - has lower front teeth. Very dry mouth with old blood around lips. No tongue laceration.  Eyes:     General:        Right eye: Discharge present.     Extraocular Movements: Extraocular movements intact.     Pupils:  Pupils are equal, round, and reactive to light.     Comments: No icterus. OD bulbar conjunctiva with small swelling lower left aspect  Cardiovascular:     Rate and Rhythm: Regular rhythm. Tachycardia present.     Heart sounds: No murmur heard.    Comments: 2+ radial and DP pulses. Pulmonary:     Effort: Pulmonary effort is normal.     Breath sounds: Normal breath sounds. No wheezing, rhonchi or rales.  Abdominal:     General: Bowel sounds are normal. There is no distension.     Palpations: Abdomen is soft.     Tenderness: There is no guarding.  Musculoskeletal:        General: No swelling or deformity.     Cervical back: Tenderness present. No rigidity.     Comments: Right hand appears normal with normal movement  Lymphadenopathy:     Cervical: No cervical adenopathy.  Skin:    General: Skin is dry.     Coloration: Skin is not jaundiced.     Findings: Bruising present.     Comments: Multiple abrasions face, upper anterior chest  Neurological:     Cranial Nerves: No cranial nerve deficit.     Coordination: Coordination abnormal.     Comments: Oriented to self. Not oriented to place. Poor recollection of events over past 48-72 hours. Does admit to frequent falls over the past days.   Psychiatric:     Comments: Flat affect.      Labs on Admission: I have personally reviewed following labs and imaging studies  CBC: Recent Labs  Lab 01/22/23 1123  WBC 6.9  NEUTROABS 5.2  HGB 14.2  HCT 45.1  MCV 103.7*  PLT 235    Basic Metabolic Panel: Recent Labs  Lab 01/22/23 1123  NA 144  K 4.8  CL 104  CO2 22  GLUCOSE 83  BUN 64*  CREATININE 5.65*  CALCIUM 9.9   GFR: Estimated Creatinine Clearance: 11.9 mL/min (A) (by C-G formula based on SCr of 5.65 mg/dL (H)). Liver Function Tests: Recent Labs  Lab 01/22/23 1123  AST 46*  ALT 31  ALKPHOS 91  BILITOT 1.4*  PROT 7.6  ALBUMIN 3.4*   No results for input(s): "LIPASE", "AMYLASE" in the last 168 hours. No results for input(s): "AMMONIA" in the last 168 hours. Coagulation Profile: Recent Labs  Lab 01/22/23 1123  INR 1.1   Cardiac Enzymes: Recent Labs  Lab 01/22/23 1123  CKTOTAL 208   BNP (last 3 results) No results for input(s): "PROBNP" in the last 8760 hours. HbA1C: No results for input(s): "HGBA1C" in the last 72 hours. CBG: No results for input(s): "GLUCAP" in the last 168 hours. Lipid Profile: No results for input(s): "CHOL", "HDL", "LDLCALC", "TRIG", "CHOLHDL", "LDLDIRECT" in the last 72 hours. Thyroid Function Tests: No results for input(s): "TSH", "T4TOTAL", "FREET4", "T3FREE", "THYROIDAB" in the last 72 hours. Anemia Panel: No results for input(s): "VITAMINB12", "FOLATE", "FERRITIN", "TIBC", "IRON", "RETICCTPCT" in the last 72 hours. Urine analysis:    Component Value Date/Time   COLORURINE YELLOW 01/22/2023 1124   APPEARANCEUR CLEAR 01/22/2023 1124   LABSPEC 1.010 01/22/2023 1124   PHURINE 6.0 01/22/2023 1124   GLUCOSEU NEGATIVE 01/22/2023 1124   GLUCOSEU NEGATIVE 09/22/2011 1445   HGBUR SMALL (A) 01/22/2023 1124   BILIRUBINUR SMALL (A) 01/22/2023 1124   KETONESUR 15 (A) 01/22/2023 1124   PROTEINUR NEGATIVE 01/22/2023 1124   UROBILINOGEN 0.2 10/07/2014 1439   NITRITE NEGATIVE 01/22/2023 1124   LEUKOCYTESUR NEGATIVE 01/22/2023  1124    Radiological Exams on Admission: I have personally reviewed images CT L-SPINE NO CHARGE  Result Date: 01/22/2023 CLINICAL DATA:  Patient found down.  Last seen 3 days ago.  EXAM: CT LUMBAR SPINE WITHOUT CONTRAST TECHNIQUE: Multidetector CT imaging of the lumbar spine was performed without intravenous contrast administration. Multiplanar CT image reconstructions were also generated. RADIATION DOSE REDUCTION: This exam was performed according to the departmental dose-optimization program which includes automated exposure control, adjustment of the mA and/or kV according to patient size and/or use of iterative reconstruction technique. COMPARISON:  CT of the lumbar spine 4/5/3. FINDINGS: Segmentation: 5 non rib-bearing lumbar type vertebral bodies are present. The lowest fully formed vertebral body is L5. Alignment: Slight degenerative anterolisthesis L4-5 is stable. Other significant listhesis is present. Rightward curvature is centered at L4-5. Leftward curvature is centered at L5-S1 Vertebrae: Progressive vertebral plana compression fractures present at L1. Endplate sclerotic changes are worse on the left at L4-5 and right at vertebral heights are maintained. Paraspinal and other soft tissues: Atherosclerotic calcifications are present the aorta branch no significant adenopathy is present. The paraspinous musculature is atrophic. Disc levels: L1-2: Facet hypertrophy again noted. Disc protrusion extends into the foramina bilaterally. Mild bilateral foraminal stenosis is present. L2-3: Mild facet hypertrophy is present. No significant protrusion or stenosis is present. L3-4: Mild broad-based disc bulge is present. Facet hypertrophy is noted bilaterally. Mild foraminal narrowing is worse on the right. L4-5: Endplate sclerotic changes worse on left. Facet spurring is worse on the right. Moderate foraminal stenosis is present bilaterally. Past wrapped disease is noted. L5-S1: Asymmetric right-sided facet hypertrophy and spurring is present. A rightward disc protrusion is present. This results in moderate right foraminal stenosis. Mild left foraminal narrowing is present. IMPRESSION: 1.  Progressive vertebral plana compression fracture at L1. 2. No new fractures. 3. Multilevel spondylosis of the lumbar spine as described. 4. Mild bilateral foraminal stenosis at L1-2 and L3-4 is worse on the right. 5. Moderate foraminal stenosis bilaterally at L4-5 is worse on the right. 6. Moderate right and mild left foraminal stenosis at L5-S1. 7.  Aortic Atherosclerosis (ICD10-I70.0). Electronically Signed   By: Marin Roberts M.D.   On: 01/22/2023 13:51   CT CHEST ABDOMEN PELVIS WO CONTRAST  Result Date: 01/22/2023 CLINICAL DATA:  Polytrauma, blunt.  Fall. EXAM: CT CHEST, ABDOMEN AND PELVIS WITHOUT CONTRAST TECHNIQUE: Multidetector CT imaging of the chest, abdomen and pelvis was performed following the standard protocol without IV contrast. RADIATION DOSE REDUCTION: This exam was performed according to the departmental dose-optimization program which includes automated exposure control, adjustment of the mA and/or kV according to patient size and/or use of iterative reconstruction technique. COMPARISON:  09/29/2011 abdominal CT. Thoracic and lumbar spine CTs 12/29/2021 FINDINGS: CT CHEST FINDINGS Cardiovascular: Heart is borderline in size. Aorta normal caliber. Scattered coronary artery and aortic calcifications. Mediastinum/Nodes: No mediastinal, hilar, or axillary adenopathy. Trachea and esophagus are unremarkable. Lungs/Pleura: Linear areas of scarring in the lower lobes bilaterally. No effusions or pneumothorax Musculoskeletal: Chronic compression fractures noted at T9 and T5. Mild compression fracture through the superior endplate of T3 age-indeterminate, but new since 12/29/2021. CT ABDOMEN PELVIS FINDINGS Hepatobiliary: No hepatic injury or perihepatic hematoma. Prior cholecystectomy. Pneumobilia noted. Pancreas: No focal abnormality or ductal dilatation. Spleen: No splenic injury or perisplenic hematoma. Adrenals/Urinary Tract: No adrenal hemorrhage or renal injury identified. Bladder is  unremarkable. No hydronephrosis. Stomach/Bowel: Prior hemicolectomy.  No bowel obstruction. Vascular/Lymphatic: Aortic atherosclerosis. No evidence of aneurysm or adenopathy. Reproductive: No visible  focal abnormality. Other: No free fluid or free air. Musculoskeletal: Chronic compression fracture at L1, progressed significantly since 12/29/2021. Degenerative disc disease most pronounced at L4-5. Diffuse degenerative facet disease. IMPRESSION: Chronic compression fractures at T5, T9 and L1. L1 compression fracture has progressed since prior study. Age indeterminate mild compression fracture through the superior endplate of T3, new since 12/29/2021. Otherwise, no acute finding or significant traumatic injury in the chest, abdomen or pelvis. Bibasilar scarring. Coronary artery disease, aortic atherosclerosis. Electronically Signed   By: Charlett Nose M.D.   On: 01/22/2023 13:45   CT T-SPINE NO CHARGE  Result Date: 01/22/2023 CLINICAL DATA:  Fall.  Patient found down yesterday. EXAM: CT THORACIC SPINE WITHOUT CONTRAST TECHNIQUE: Multidetector CT images of the thoracic were obtained using the standard protocol without intravenous contrast. RADIATION DOSE REDUCTION: This exam was performed according to the departmental dose-optimization program which includes automated exposure control, adjustment of the mA and/or kV according to patient size and/or use of iterative reconstruction technique. COMPARISON:  CT of the thoracic spine 12/29/2021 FINDINGS: Alignment: No significant ceases is present. Mild leftward curvature is present in the midthoracic spine. Vertebrae: Acute superior endplate fracture at T3 demonstrates 30% loss height without retropulsed bone. Remote superior endplate T5 fracture is present. Remote superior endplate T9 fracture is present. Schmorl's nodes involving the superior endplate of T8 both endplates at T8-9 in the inferior endplate of T9 have progressed. Schmorl's node the superior endplate of T11  associated remote superior endplate fracture is stable. L1 vertebral plana compression fracture has progressed Paraspinal and other soft tissues: Lungs are clear. Areas of atelectasis or scarring at the left lung base are similar to the prior study. Lungs are otherwise unremarkable. Disc levels: Significant focal disc disease or stenosis is present. IMPRESSION: 1. Acute superior endplate fracture at T3 with 30% loss height without retropulsed bone. 2. Remote superior endplate fractures at T5, T9, and T11. 3. Progressive vertebral plana compression fracture at L1. 4. Progressive Schmorl's nodes involving the superior endplate of T8 both endplates at T8-9 in the inferior endplate of T9. 5. Mild leftward curvature of the midthoracic spine. 6. Areas of atelectasis or scarring at the left lung base are similar to the prior study. Electronically Signed   By: Marin Roberts M.D.   On: 01/22/2023 13:44   DG Shoulder Right  Result Date: 01/22/2023 CLINICAL DATA:  Fall.  Pain. EXAM: RIGHT SHOULDER - 2+ VIEW COMPARISON:  None Available. FINDINGS: There is no evidence of fracture or dislocation. There is no evidence of arthropathy or other focal bone abnormality. Soft tissues are unremarkable. IMPRESSION: Negative right shoulder radiographs. Electronically Signed   By: Marin Roberts M.D.   On: 01/22/2023 13:37   DG Knee Complete 4 Views Left  Result Date: 01/22/2023 CLINICAL DATA:  Left knee pain.  Fall. EXAM: LEFT KNEE - COMPLETE 4+ VIEW COMPARISON:  None Available. FINDINGS: No evidence of fracture, dislocation, or joint effusion. No evidence of arthropathy or other focal bone abnormality. Soft tissues are unremarkable. IMPRESSION: Negative left knee radiographs. Electronically Signed   By: Marin Roberts M.D.   On: 01/22/2023 13:36   CT Maxillofacial Wo Contrast  Result Date: 01/22/2023 CLINICAL DATA:  Facial trauma, blunt.  Fall. EXAM: CT MAXILLOFACIAL WITHOUT CONTRAST TECHNIQUE: Multidetector  CT imaging of the maxillofacial structures was performed. Multiplanar CT image reconstructions were also generated. RADIATION DOSE REDUCTION: This exam was performed according to the departmental dose-optimization program which includes automated exposure control, adjustment of the mA and/or kV  according to patient size and/or use of iterative reconstruction technique. COMPARISON:  None Available. FINDINGS: Osseous: No fracture or mandibular dislocation. No destructive process. Orbits: Negative. No traumatic or inflammatory finding. Sinuses: No air-fluid levels. Soft tissues: Soft tissue swelling over the right orbit and upper face. Limited intracranial: See head CT report IMPRESSION: Soft tissue swelling over the right orbit and upper face. No facial or orbital fracture. Electronically Signed   By: Charlett Nose M.D.   On: 01/22/2023 13:28   CT Cervical Spine Wo Contrast  Result Date: 01/22/2023 CLINICAL DATA:  Neck trauma (Age >= 65y), fall EXAM: CT CERVICAL SPINE WITHOUT CONTRAST TECHNIQUE: Multidetector CT imaging of the cervical spine was performed without intravenous contrast. Multiplanar CT image reconstructions were also generated. RADIATION DOSE REDUCTION: This exam was performed according to the departmental dose-optimization program which includes automated exposure control, adjustment of the mA and/or kV according to patient size and/or use of iterative reconstruction technique. COMPARISON:  None Available. FINDINGS: Alignment: No subluxation Skull base and vertebrae: No cervical spine fracture or focal bone lesion. There appears to be mild compression fracture through the superior endplate of T3, partially imaged on this cervical spine series. See further discussion of the thoracic spine series. Soft tissues and spinal canal: No prevertebral fluid or swelling. No visible canal hematoma. Disc levels: Diffuse degenerative disc disease with disc space narrowing and spurring. Mild degenerative facet  disease bilaterally. Upper chest: No acute findings Other: None IMPRESSION: Apparent mild compression fracture through the superior endplate of T3, partially visualized on this cervical spine study. See thoracic spine study for further discussion. No acute cervical spine abnormality. Spondylosis. Electronically Signed   By: Charlett Nose M.D.   On: 01/22/2023 13:26   CT Head Wo Contrast  Result Date: 01/22/2023 CLINICAL DATA:  Head trauma, minor (Age >= 65y).  Fall. EXAM: CT HEAD WITHOUT CONTRAST TECHNIQUE: Contiguous axial images were obtained from the base of the skull through the vertex without intravenous contrast. RADIATION DOSE REDUCTION: This exam was performed according to the departmental dose-optimization program which includes automated exposure control, adjustment of the mA and/or kV according to patient size and/or use of iterative reconstruction technique. COMPARISON:  11/05/2010 FINDINGS: Brain: There is atrophy and chronic small vessel disease changes. No acute intracranial abnormality. Specifically, no hemorrhage, hydrocephalus, mass lesion, acute infarction, or significant intracranial injury. Vascular: No hyperdense vessel or unexpected calcification. Skull: No acute calvarial abnormality. Sinuses/Orbits: Soft tissue swelling over the right orbit. No visible fracture. Paranasal sinuses clear. Other: None IMPRESSION: Atrophy, chronic microvascular disease. No acute intracranial abnormality. Electronically Signed   By: Charlett Nose M.D.   On: 01/22/2023 13:23    EKG: I have personally reviewed EKG: SVT at 163  Assessment/Plan Principal Problem:   AKI (acute kidney injury) (HCC) Active Problems:   History of liver transplant (HCC)   ALCOHOL ABUSE, HX OF   Hypertension   CKD (chronic kidney disease) stage 3, GFR 30-59 ml/min (HCC)   SVT (supraventricular tachycardia)   Nondisplaced fracture of base of third metacarpal bone, right hand, initial encounter for closed fracture   Spinal  stenosis of lumbar region at multiple levels    Assessment and Plan: * AKI (acute kidney injury) (HCC) Patient with CKD III at baseline. Has started process for renal transplant at [facility] last August but isn't on the list yet. He is dehydrated after not having any PO intake for greater than 48 hrs.  Plan Hydrate - 1/2NS at 100 cc/hr  Bmet in AM  Spinal stenosis of lumbar region at multiple levels Patient followed at [facility] spine center for severe stenosis L 45, advanced stenosis L 34. He has chronic back pain. Also noted to have C-spine stenosis with mild paresthesia hand(s).Question if combination of EtOH and spinal stenosis issues contributed to falling.  Plan Continue with back brace  PT/OT eval  APAP for pain    Nondisplaced fracture of base of third metacarpal bone, right hand, initial encounter for closed fracture On exam no obvious displacement. Able to make a fist.  \ Plan Ortho to place hand brace.  SVT (supraventricular tachycardia) Patient with no h/o cardiac or arrythmia issues in old records. Has had significant SVT but no hypotension. He was given dose of diltaizem in ED  Plan Medical tele admit  Continue home meds including BB  Add diltazem 60 mg tid   CKD (chronic kidney disease) stage 3, GFR 30-59 ml/min (HCC) Baseline Cr 2.8. Has been followed at [facility] and has been evaluated for transplant but has not been listed.  Plan Avoid nephrotoxic medications   Hypertension Patient reports adherence to his medical regimen. BP at admission stable  Plan Continue home medications  ALCOHOL ABUSE, HX OF Patient with chronic EtOH abuse. Had relapse in April '22, had relapse March '23. He does admit to drinking a pint of EtOH or more daily for the last days to weeks. Denies any h/o weithdrawal symptoms or DTs. Patient does admit to increasing confusion.  Plan  CIWA protocol  Folic acid, B12  History of liver transplant [facility]) Patient had transplant 2004 and 2012. He is  followed at [facility]. Reports adherence to his medical regimen. No evidence of hepatitis or liver failure.  Plan Continue home medications       DVT prophylaxis: SQ Heparin Code Status: Full Code Family Communication: spoke with Malachy Mood  Disposition Plan: TBD  Consults called: none  Admission status: Inpatient, Telemetry bed   Illene Regulus, MD [facility] 01/22/2023, 4:32 PM

## 2023-01-22 NOTE — ED Notes (Signed)
MD notified about pts HR  

## 2023-01-22 NOTE — Assessment & Plan Note (Signed)
Patient reports adherence to his medical regimen. BP at admission stable  Plan Continue home medications

## 2023-01-22 NOTE — Assessment & Plan Note (Signed)
Patient with no h/o cardiac or arrythmia issues in old records. Has had significant SVT but no hypotension. He was given dose of diltaizem in ED  Plan Medical tele admit  Continue home meds including BB  Add diltazem 60 mg tid

## 2023-01-22 NOTE — ED Notes (Signed)
Patient transported to CT 

## 2023-01-22 NOTE — Plan of Care (Signed)

## 2023-01-22 NOTE — ED Notes (Signed)
ED TO INPATIENT HANDOFF REPORT  ED Nurse Name and Phone #: [phone] [name]   S Name/Age/Gender [name] 72 y.o. male Room/Bed: 017C/017C  Code Status   Code Status: Full Code  Home/SNF/Other Home Patient oriented to: self, place, time, and situation Is this baseline? Yes   Triage Complete: Triage complete  Chief Complaint AKI (acute kidney injury) (HCC) [N17.9]  Triage Note Pt BIB EMS due to a fall. LSN 3 DAYS AGO. Pt DOES not REMEMBER FALLING. Pt was foubnd face down. Pt has mutplie facial injuries/ breakdown/ feet. BS 115. Welfare check issued yesterday. Right eyelid stuck shut.    Allergies Allergies  Allergen Reactions   Moxifloxacin Other (See Comments)    Pain     Level of Care/Admitting Diagnosis ED Disposition     ED Disposition  Admit   Condition  --   Comment  Hospital Area: [facility] [[id number]]  Level of Care: Telemetry Medical [[id number]]  May admit patient to [facility] or [facility] if equivalent level of care is available:: Yes  Covid Evaluation: Asymptomatic - no recent exposure (last 10 days) testing not required  Diagnosis: AKI (acute kidney injury) [facility]) [[id number]]  Admitting Physician: [name] [[id number]]  Attending Physician: [name] [[id number]]  Certification:: I certify this patient will need inpatient services for at least 2 midnights  Estimated Length of Stay: 5          B Medical/Surgery History Past Medical History:  Diagnosis Date   ALCOHOL ABUSE, HX OF    ANEMIA, IRON DEFICIENCY    DERMATITIS    Hepatic encephalopathy (HCC)    HEPATIC FAILURE, END STAGE 2004   liver transplant 2004; 2nd orthotopic liver transplant 01/25/11 [facility] [phone] dr. Fayrene Fearing dewar   Hypertension    THROMBOCYTOPENIA    Past Surgical History:  Procedure Laterality Date   (L) arm broke     Broke (L) ankle   2001   HERNIA REPAIR     LIVER TRANSPLANT  1st 04/21/03; 2nd 01/25/11   @ [facility] dr. dewar  [phone];  [phone]     A IV Location/Drains/Wounds Patient Lines/Drains/Airways Status     Active Line/Drains/Airways     Name Placement date Placement time Site Days   Peripheral IV 01/22/23 22 G Anterior;Right Hand 01/22/23  1105  Hand  less than 1   Peripheral IV 01/22/23 20 G Anterior;Distal;Right Forearm 01/22/23  1129  Forearm  less than 1   Peripheral IV 01/22/23 20 G Anterior;Left;Proximal Forearm 01/22/23  1140  Forearm  less than 1   Urethral Catheter Luisa Hart 01/22/23  1326  --  less than 1            Intake/Output Last 24 hours No intake or output data in the 24 hours ending 01/22/23 1642  Labs/Imaging Results for orders placed or performed during the hospital encounter of 01/22/23 (from the past 48 hour(s))  Comprehensive metabolic panel     Status: Abnormal   Collection Time: 01/22/23 11:23 AM  Result Value Ref Range   Sodium 144 135 - 145 mmol/L   Potassium 4.8 3.5 - 5.1 mmol/L   Chloride 104 98 - 111 mmol/L   CO2 22 22 - 32 mmol/L   Glucose, Bld 83 70 - 99 mg/dL    Comment: Glucose reference range applies only to samples taken after fasting for at least 8 hours.   BUN 64 (H) 8 - 23 mg/dL   Creatinine, Ser 2.84 (H) 0.61 -  1.24 mg/dL   Calcium 9.9 8.9 - 16.1 mg/dL   Total Protein 7.6 6.5 - 8.1 g/dL   Albumin 3.4 (L) 3.5 - 5.0 g/dL   AST 46 (H) 15 - 41 U/L   ALT 31 0 - 44 U/L   Alkaline Phosphatase 91 38 - 126 U/L   Total Bilirubin 1.4 (H) 0.3 - 1.2 mg/dL   GFR, Estimated 10 (L) >60 mL/min    Comment: (NOTE) Calculated using the CKD-EPI Creatinine Equation (2021)    Anion gap 18 (H) 5 - 15    Comment: Performed at [facility], [address], [place], [place] [address]  CBC with Differential     Status: Abnormal   Collection Time: 01/22/23 11:23 AM  Result Value Ref Range   WBC 6.9 4.0 - 10.5 K/uL   RBC 4.35 4.22 - 5.81 MIL/uL   Hemoglobin 14.2 13.0 - 17.0 g/dL   HCT 54.0 98.1 - 19.1 %   MCV 103.7 (H) 80.0 - 100.0 fL   MCH 32.6  26.0 - 34.0 pg   MCHC 31.5 30.0 - 36.0 g/dL   RDW 47.8 29.5 - 62.1 %   Platelets 235 150 - 400 K/uL   nRBC 0.0 0.0 - 0.2 %   Neutrophils Relative % 76 %   Neutro Abs 5.2 1.7 - 7.7 K/uL   Lymphocytes Relative 12 %   Lymphs Abs 0.8 0.7 - 4.0 K/uL   Monocytes Relative 12 %   Monocytes Absolute 0.8 0.1 - 1.0 K/uL   Eosinophils Relative 0 %   Eosinophils Absolute 0.0 0.0 - 0.5 K/uL   Basophils Relative 0 %   Basophils Absolute 0.0 0.0 - 0.1 K/uL   Immature Granulocytes 0 %   Abs Immature Granulocytes 0.03 0.00 - 0.07 K/uL    Comment: Performed at [facility], [address] [place], [place] [address]  CK     Status: None   Collection Time: 01/22/23 11:23 AM  Result Value Ref Range   Total CK 208 49 - 397 U/L    Comment: Performed at [facility], [address] [place], [place] [address]  Lactic acid, plasma     Status: None   Collection Time: 01/22/23 11:23 AM  Result Value Ref Range   Lactic Acid, Venous 1.9 0.5 - 1.9 mmol/L    Comment: Performed at [facility], [address] [place], [place] [address]  Protime-INR     Status: None   Collection Time: 01/22/23 11:23 AM  Result Value Ref Range   Prothrombin Time 14.5 11.4 - 15.2 seconds   INR 1.1 0.8 - 1.2    Comment: (NOTE) INR goal varies based on device and disease states. Performed at [facility], [address], [place], [place] [address]   APTT     Status: None   Collection Time: 01/22/23 11:23 AM  Result Value Ref Range   aPTT 29 24 - 36 seconds    Comment: Performed at [facility], [address], [place], [place] [address]  SARS Coronavirus 2 by RT PCR (hospital order, performed in [facility] hospital lab) *cepheid single result test* Anterior Nasal Swab     Status: None   Collection Time: 01/22/23 11:24 AM   Specimen: Anterior Nasal Swab  Result Value Ref Range   SARS Coronavirus 2 by RT PCR NEGATIVE NEGATIVE    Comment: Performed at [facility], [address]  [place],  [place] [address]  Urinalysis, Routine w reflex microscopic -Urine, Clean Catch     Status: Abnormal   Collection Time: 01/22/23 11:24 AM  Result Value Ref Range   Color, Urine YELLOW YELLOW   APPearance CLEAR CLEAR   Specific Gravity, Urine 1.010 1.005 - 1.030   pH 6.0 5.0 - 8.0   Glucose, UA NEGATIVE NEGATIVE mg/dL   Hgb urine dipstick SMALL (A) NEGATIVE   Bilirubin Urine SMALL (A) NEGATIVE   Ketones, ur 15 (A) NEGATIVE mg/dL   Protein, ur NEGATIVE NEGATIVE mg/dL   Nitrite NEGATIVE NEGATIVE   Leukocytes,Ua NEGATIVE NEGATIVE    Comment: Performed at [facility], [address], [place], [place] [address]  Urine rapid drug screen (hosp performed)     Status: None   Collection Time: 01/22/23 11:24 AM  Result Value Ref Range   Opiates NONE DETECTED NONE DETECTED   Cocaine NONE DETECTED NONE DETECTED   Benzodiazepines NONE DETECTED NONE DETECTED   Amphetamines NONE DETECTED NONE DETECTED   Tetrahydrocannabinol NONE DETECTED NONE DETECTED   Barbiturates NONE DETECTED NONE DETECTED    Comment: (NOTE) DRUG SCREEN FOR MEDICAL PURPOSES ONLY.  IF CONFIRMATION IS NEEDED FOR ANY PURPOSE, NOTIFY LAB WITHIN 5 DAYS.  LOWEST DETECTABLE LIMITS FOR URINE DRUG SCREEN Drug Class                     Cutoff (ng/mL) Amphetamine and metabolites    1000 Barbiturate and metabolites    200 Benzodiazepine                 200 Opiates and metabolites        300 Cocaine and metabolites        300 THC                            50 Performed at [facility], [address] [place], [place] [address]   Urinalysis, Microscopic (reflex)     Status: None   Collection Time: 01/22/23 11:24 AM  Result Value Ref Range   RBC / HPF 0-5 0 - 5 RBC/hpf   WBC, UA NONE SEEN 0 - 5 WBC/hpf   Bacteria, UA NONE SEEN NONE SEEN   Squamous Epithelial / HPF NONE SEEN 0 - 5 /HPF    Comment: Performed at [facility], [address] [place], [place] [address]   CT L-SPINE NO CHARGE  Result Date:  01/22/2023 CLINICAL DATA:  Patient found down.  Last seen 3 days ago. EXAM: CT LUMBAR SPINE WITHOUT CONTRAST TECHNIQUE: Multidetector CT imaging of the lumbar spine was performed without intravenous contrast administration. Multiplanar CT image reconstructions were also generated. RADIATION DOSE REDUCTION: This exam was performed according to the departmental dose-optimization program which includes automated exposure control, adjustment of the mA and/or kV according to patient size and/or use of iterative reconstruction technique. COMPARISON:  CT of the lumbar spine 4/5/3. FINDINGS: Segmentation: 5 non rib-bearing lumbar type vertebral bodies are present. The lowest fully formed vertebral body is L5. Alignment: Slight degenerative anterolisthesis L4-5 is stable. Other significant listhesis is present. Rightward curvature is centered at L4-5. Leftward curvature is centered at L5-S1 Vertebrae: Progressive vertebral plana compression fractures present at L1. Endplate sclerotic changes are worse on the left at L4-5 and right at vertebral heights are maintained. Paraspinal and other soft tissues: Atherosclerotic calcifications are present the aorta branch no significant adenopathy is present. The paraspinous musculature  is atrophic. Disc levels: L1-2: Facet hypertrophy again noted. Disc protrusion extends into the foramina bilaterally. Mild bilateral foraminal stenosis is present. L2-3: Mild facet hypertrophy is present. No significant protrusion or stenosis is present. L3-4: Mild broad-based disc bulge is present. Facet hypertrophy is noted bilaterally. Mild foraminal narrowing is worse on the right. L4-5: Endplate sclerotic changes worse on left. Facet spurring is worse on the right. Moderate foraminal stenosis is present bilaterally. Past wrapped disease is noted. L5-S1: Asymmetric right-sided facet hypertrophy and spurring is present. A rightward disc protrusion is present. This results in moderate right foraminal  stenosis. Mild left foraminal narrowing is present. IMPRESSION: 1. Progressive vertebral plana compression fracture at L1. 2. No new fractures. 3. Multilevel spondylosis of the lumbar spine as described. 4. Mild bilateral foraminal stenosis at L1-2 and L3-4 is worse on the right. 5. Moderate foraminal stenosis bilaterally at L4-5 is worse on the right. 6. Moderate right and mild left foraminal stenosis at L5-S1. 7.  Aortic Atherosclerosis (ICD10-I70.0). Electronically Signed   By: Marin Roberts M.D.   On: 01/22/2023 13:51   CT CHEST ABDOMEN PELVIS WO CONTRAST  Result Date: 01/22/2023 CLINICAL DATA:  Polytrauma, blunt.  Fall. EXAM: CT CHEST, ABDOMEN AND PELVIS WITHOUT CONTRAST TECHNIQUE: Multidetector CT imaging of the chest, abdomen and pelvis was performed following the standard protocol without IV contrast. RADIATION DOSE REDUCTION: This exam was performed according to the departmental dose-optimization program which includes automated exposure control, adjustment of the mA and/or kV according to patient size and/or use of iterative reconstruction technique. COMPARISON:  09/29/2011 abdominal CT. Thoracic and lumbar spine CTs 12/29/2021 FINDINGS: CT CHEST FINDINGS Cardiovascular: Heart is borderline in size. Aorta normal caliber. Scattered coronary artery and aortic calcifications. Mediastinum/Nodes: No mediastinal, hilar, or axillary adenopathy. Trachea and esophagus are unremarkable. Lungs/Pleura: Linear areas of scarring in the lower lobes bilaterally. No effusions or pneumothorax Musculoskeletal: Chronic compression fractures noted at T9 and T5. Mild compression fracture through the superior endplate of T3 age-indeterminate, but new since 12/29/2021. CT ABDOMEN PELVIS FINDINGS Hepatobiliary: No hepatic injury or perihepatic hematoma. Prior cholecystectomy. Pneumobilia noted. Pancreas: No focal abnormality or ductal dilatation. Spleen: No splenic injury or perisplenic hematoma. Adrenals/Urinary Tract:  No adrenal hemorrhage or renal injury identified. Bladder is unremarkable. No hydronephrosis. Stomach/Bowel: Prior hemicolectomy.  No bowel obstruction. Vascular/Lymphatic: Aortic atherosclerosis. No evidence of aneurysm or adenopathy. Reproductive: No visible focal abnormality. Other: No free fluid or free air. Musculoskeletal: Chronic compression fracture at L1, progressed significantly since 12/29/2021. Degenerative disc disease most pronounced at L4-5. Diffuse degenerative facet disease. IMPRESSION: Chronic compression fractures at T5, T9 and L1. L1 compression fracture has progressed since prior study. Age indeterminate mild compression fracture through the superior endplate of T3, new since 12/29/2021. Otherwise, no acute finding or significant traumatic injury in the chest, abdomen or pelvis. Bibasilar scarring. Coronary artery disease, aortic atherosclerosis. Electronically Signed   By: Charlett Nose M.D.   On: 01/22/2023 13:45   CT T-SPINE NO CHARGE  Result Date: 01/22/2023 CLINICAL DATA:  Fall.  Patient found down yesterday. EXAM: CT THORACIC SPINE WITHOUT CONTRAST TECHNIQUE: Multidetector CT images of the thoracic were obtained using the standard protocol without intravenous contrast. RADIATION DOSE REDUCTION: This exam was performed according to the departmental dose-optimization program which includes automated exposure control, adjustment of the mA and/or kV according to patient size and/or use of iterative reconstruction technique. COMPARISON:  CT of the thoracic spine 12/29/2021 FINDINGS: Alignment: No significant ceases is present. Mild leftward curvature is present in  the midthoracic spine. Vertebrae: Acute superior endplate fracture at T3 demonstrates 30% loss height without retropulsed bone. Remote superior endplate T5 fracture is present. Remote superior endplate T9 fracture is present. Schmorl's nodes involving the superior endplate of T8 both endplates at T8-9 in the inferior endplate of T9  have progressed. Schmorl's node the superior endplate of T11 associated remote superior endplate fracture is stable. L1 vertebral plana compression fracture has progressed Paraspinal and other soft tissues: Lungs are clear. Areas of atelectasis or scarring at the left lung base are similar to the prior study. Lungs are otherwise unremarkable. Disc levels: Significant focal disc disease or stenosis is present. IMPRESSION: 1. Acute superior endplate fracture at T3 with 30% loss height without retropulsed bone. 2. Remote superior endplate fractures at T5, T9, and T11. 3. Progressive vertebral plana compression fracture at L1. 4. Progressive Schmorl's nodes involving the superior endplate of T8 both endplates at T8-9 in the inferior endplate of T9. 5. Mild leftward curvature of the midthoracic spine. 6. Areas of atelectasis or scarring at the left lung base are similar to the prior study. Electronically Signed   By: Marin Roberts M.D.   On: 01/22/2023 13:44   DG Shoulder Right  Result Date: 01/22/2023 CLINICAL DATA:  Fall.  Pain. EXAM: RIGHT SHOULDER - 2+ VIEW COMPARISON:  None Available. FINDINGS: There is no evidence of fracture or dislocation. There is no evidence of arthropathy or other focal bone abnormality. Soft tissues are unremarkable. IMPRESSION: Negative right shoulder radiographs. Electronically Signed   By: Marin Roberts M.D.   On: 01/22/2023 13:37   DG Knee Complete 4 Views Left  Result Date: 01/22/2023 CLINICAL DATA:  Left knee pain.  Fall. EXAM: LEFT KNEE - COMPLETE 4+ VIEW COMPARISON:  None Available. FINDINGS: No evidence of fracture, dislocation, or joint effusion. No evidence of arthropathy or other focal bone abnormality. Soft tissues are unremarkable. IMPRESSION: Negative left knee radiographs. Electronically Signed   By: Marin Roberts M.D.   On: 01/22/2023 13:36   CT Maxillofacial Wo Contrast  Result Date: 01/22/2023 CLINICAL DATA:  Facial trauma, blunt.  Fall.  EXAM: CT MAXILLOFACIAL WITHOUT CONTRAST TECHNIQUE: Multidetector CT imaging of the maxillofacial structures was performed. Multiplanar CT image reconstructions were also generated. RADIATION DOSE REDUCTION: This exam was performed according to the departmental dose-optimization program which includes automated exposure control, adjustment of the mA and/or kV according to patient size and/or use of iterative reconstruction technique. COMPARISON:  None Available. FINDINGS: Osseous: No fracture or mandibular dislocation. No destructive process. Orbits: Negative. No traumatic or inflammatory finding. Sinuses: No air-fluid levels. Soft tissues: Soft tissue swelling over the right orbit and upper face. Limited intracranial: See head CT report IMPRESSION: Soft tissue swelling over the right orbit and upper face. No facial or orbital fracture. Electronically Signed   By: Charlett Nose M.D.   On: 01/22/2023 13:28   CT Cervical Spine Wo Contrast  Result Date: 01/22/2023 CLINICAL DATA:  Neck trauma (Age >= 65y), fall EXAM: CT CERVICAL SPINE WITHOUT CONTRAST TECHNIQUE: Multidetector CT imaging of the cervical spine was performed without intravenous contrast. Multiplanar CT image reconstructions were also generated. RADIATION DOSE REDUCTION: This exam was performed according to the departmental dose-optimization program which includes automated exposure control, adjustment of the mA and/or kV according to patient size and/or use of iterative reconstruction technique. COMPARISON:  None Available. FINDINGS: Alignment: No subluxation Skull base and vertebrae: No cervical spine fracture or focal bone lesion. There appears to be mild compression fracture through the  superior endplate of T3, partially imaged on this cervical spine series. See further discussion of the thoracic spine series. Soft tissues and spinal canal: No prevertebral fluid or swelling. No visible canal hematoma. Disc levels: Diffuse degenerative disc disease  with disc space narrowing and spurring. Mild degenerative facet disease bilaterally. Upper chest: No acute findings Other: None IMPRESSION: Apparent mild compression fracture through the superior endplate of T3, partially visualized on this cervical spine study. See thoracic spine study for further discussion. No acute cervical spine abnormality. Spondylosis. Electronically Signed   By: Charlett Nose M.D.   On: 01/22/2023 13:26   CT Head Wo Contrast  Result Date: 01/22/2023 CLINICAL DATA:  Head trauma, minor (Age >= 65y).  Fall. EXAM: CT HEAD WITHOUT CONTRAST TECHNIQUE: Contiguous axial images were obtained from the base of the skull through the vertex without intravenous contrast. RADIATION DOSE REDUCTION: This exam was performed according to the departmental dose-optimization program which includes automated exposure control, adjustment of the mA and/or kV according to patient size and/or use of iterative reconstruction technique. COMPARISON:  11/05/2010 FINDINGS: Brain: There is atrophy and chronic small vessel disease changes. No acute intracranial abnormality. Specifically, no hemorrhage, hydrocephalus, mass lesion, acute infarction, or significant intracranial injury. Vascular: No hyperdense vessel or unexpected calcification. Skull: No acute calvarial abnormality. Sinuses/Orbits: Soft tissue swelling over the right orbit. No visible fracture. Paranasal sinuses clear. Other: None IMPRESSION: Atrophy, chronic microvascular disease. No acute intracranial abnormality. Electronically Signed   By: Charlett Nose M.D.   On: 01/22/2023 13:23    Pending Labs Unresulted Labs (From admission, onward)     Start     Ordered   01/23/23 0500  Basic metabolic panel  Tomorrow morning,   R        01/22/23 1618   01/23/23 0500  CBC  Tomorrow morning,   R        01/22/23 1618   01/22/23 1615  HIV Antibody (routine testing w rflx)  (HIV Antibody (Routine testing w reflex) panel)  Add-on,   AD        01/22/23 1618    01/22/23 1133  Ammonia  Once,   STAT        01/22/23 1132   01/22/23 1127  Ethanol  Once,   URGENT        01/22/23 1126   01/22/23 1124  Blood culture (routine x 2)  BLOOD CULTURE X 2,   R (with STAT occurrences)      01/22/23 1126            Vitals/Pain Today's Vitals   01/22/23 1417 01/22/23 1418 01/22/23 1419 01/22/23 1430  BP:    133/83  Pulse: (!) 107 (!) 119 (!) 124 (!) 109  Resp: (!) 32 19 20 (!) 25  Temp: 99.7 F (37.6 C) 99.7 F (37.6 C) 99.7 F (37.6 C) 99.3 F (37.4 C)  TempSrc:      SpO2: 99% 91% 97% 97%  Weight:      Height:      PainSc:        Isolation Precautions No active isolations  Medications Medications  vancomycin variable dose per unstable renal function (pharmacist dosing) (has no administration in time range)  ceFEPIme (MAXIPIME) 1 g in sodium chloride 0.9 % 100 mL IVPB (has no administration in time range)  sulfamethoxazole-trimethoprim (BACTRIM) 400-80 MG per tablet 1 tablet (has no administration in time range)  amLODipine (NORVASC) tablet 5 mg (has no administration in time range)  metoprolol tartrate (  LOPRESSOR) tablet 50 mg (has no administration in time range)  fludrocortisone (FLORINEF) tablet 0.1 mg (has no administration in time range)  magnesium oxide (MAG-OX) tablet 400 mg (has no administration in time range)  pantoprazole (PROTONIX) EC tablet 40 mg (has no administration in time range)  ondansetron (ZOFRAN-ODT) disintegrating tablet 4 mg (has no administration in time range)  sodium bicarbonate tablet 1,300 mg (has no administration in time range)  ferrous sulfate tablet 325 mg (has no administration in time range)  cyclobenzaprine (FLEXERIL) tablet 10 mg (has no administration in time range)  heparin injection 5,000 Units (has no administration in time range)  0.45 % sodium chloride infusion (has no administration in time range)  acetaminophen (TYLENOL) tablet 650 mg (has no administration in time range)    Or  acetaminophen  (TYLENOL) suppository 650 mg (has no administration in time range)  HYDROmorphone (DILAUDID) injection 0.5-1 mg (has no administration in time range)  senna (SENOKOT) tablet 8.6 mg (has no administration in time range)  LORazepam (ATIVAN) tablet 1-4 mg (has no administration in time range)    Or  LORazepam (ATIVAN) tablet 0.5 mg (has no administration in time range)  thiamine (VITAMIN B1) tablet 100 mg (has no administration in time range)    Or  thiamine (VITAMIN B1) injection 100 mg (has no administration in time range)  folic acid (FOLVITE) tablet 1 mg (has no administration in time range)  multivitamin with minerals tablet 1 tablet (has no administration in time range)  tacrolimus (PROGRAF) capsule 1 mg (has no administration in time range)  sodium chloride 0.9 % bolus 1,000 mL (0 mLs Intravenous Stopped 01/22/23 1352)  vancomycin (VANCOREADY) IVPB 1500 mg/300 mL (0 mg Intravenous Stopped 01/22/23 1558)  ceFEPIme (MAXIPIME) 2 g in sodium chloride 0.9 % 100 mL IVPB (0 g Intravenous Stopped 01/22/23 1404)  sodium chloride 0.9 % bolus 1,000 mL (0 mLs Intravenous Stopped 01/22/23 1558)  diltiazem (CARDIZEM) injection 5 mg (5 mg Intravenous Given 01/22/23 1411)    Mobility non-ambulatory     Focused Assessments Neuro Assessment Handoff:  Swallow screen pass? Yes          Neuro Assessment:   Neuro Checks:      Has TPA been given? No If patient is a Neuro Trauma and patient is going to OR before floor call report to 4N Charge nurse: [phone] or [phone]   R Recommendations: See Admitting Provider Note  Report given to:   Additional Notes: axox4, but confused at times, room air

## 2023-01-22 NOTE — ED Triage Notes (Signed)
Pt BIB EMS due to a fall. LSN 3 DAYS AGO. Pt DOES not REMEMBER FALLING. Pt was foubnd face down. Pt has mutplie facial injuries/ breakdown/ feet. BS 115. Welfare check issued yesterday. Right eyelid stuck shut.

## 2023-01-22 NOTE — Progress Notes (Signed)
Orthopedic Tech Progress Note Patient Details:  [name] [birth year] [id number]  Ortho Devices Type of Ortho Device: Finger splint Ortho Device/Splint Location: RUE Ortho Device/Splint Interventions: Application   Post Interventions Patient Tolerated: Well Instructions Provided: Adjustment of device  Genelle Bal [name] 01/22/2023, 6:43 PM

## 2023-01-22 NOTE — Assessment & Plan Note (Addendum)
Patient with chronic EtOH abuse. Had relapse in April '22, had relapse March '23. He does admit to drinking a pint of EtOH or more daily for the last days to weeks. Denies any h/o weithdrawal symptoms or DTs. Patient does admit to increasing confusion.  Plan  CIWA protocol  Folic acid, B12

## 2023-01-22 NOTE — Assessment & Plan Note (Signed)
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 He has chronic back pain. Also noted to have C-spine stenosis with mild paresthesia hand(s).Question if combination of EtOH and spinal stenosis issues contributed to falling.  Plan Continue with back brace  PT/OT eval  APAP for pain

## 2023-01-22 NOTE — Progress Notes (Signed)
Pharmacy Antibiotic Note  [name] is a 72 y.o. male admitted on 01/22/2023 presenting found down, concern for sepsis.  Pharmacy has been consulted for vancomycin and cefepime dosing.  Plan: Vancomycin 1500 mg IV x 1, then variable dosing d/t unstable renal function Cefepime 2g IV x 1, then 1g IV q 24h Monitor renal function, Cx and clinical progression to narrow Vancomycin levels as indicated  Height: 5\' 8"  (172.7 cm) Weight: 72.6 kg (160 lb) IBW/kg (Calculated) : 68.4  Temp (24hrs), Avg:98.4 F (36.9 C), Min:98.4 F (36.9 C), Max:98.4 F (36.9 C)  Recent Labs  Lab 01/22/23 1123  WBC 6.9  CREATININE 5.65*    Estimated Creatinine Clearance: 11.9 mL/min (A) (by C-G formula based on SCr of 5.65 mg/dL (H)).    Allergies  Allergen Reactions   Moxifloxacin Other (See Comments)    Pain     Daylene Posey, PharmD, [facility] Clinical Pharmacist ED Pharmacist Phone # [phone] 01/22/2023 12:35 PM

## 2023-01-22 NOTE — Assessment & Plan Note (Signed)
Patient with CKD III at baseline. Has started process for renal transplant at [facility] last August but isn't on the list yet. He is dehydrated after not having any PO intake for greater than 48 hrs.  Plan Hydrate - 1/2NS at 100 cc/hr  Bmet in AM

## 2023-01-23 DIAGNOSIS — N179 Acute kidney failure, unspecified: Secondary | ICD-10-CM | POA: Diagnosis not present

## 2023-01-23 LAB — BASIC METABOLIC PANEL
Anion gap: 13 (ref 5–15)
BUN: 52 mg/dL — ABNORMAL HIGH (ref 8–23)
CO2: 21 mmol/L — ABNORMAL LOW (ref 22–32)
Calcium: 8.8 mg/dL — ABNORMAL LOW (ref 8.9–10.3)
Chloride: 108 mmol/L (ref 98–111)
Creatinine, Ser: 3.75 mg/dL — ABNORMAL HIGH (ref 0.61–1.24)
GFR, Estimated: 17 mL/min — ABNORMAL LOW (ref >60)
Glucose, Bld: 77 mg/dL (ref 70–99)
Potassium: 3.9 mmol/L (ref 3.5–5.1)
Sodium: 142 mmol/L (ref 135–145)

## 2023-01-23 LAB — CBC
HCT: 39.1 % (ref 39.0–52.0)
Hemoglobin: 12.4 g/dL — ABNORMAL LOW (ref 13.0–17.0)
MCH: 32.7 pg (ref 26.0–34.0)
MCHC: 31.7 g/dL (ref 30.0–36.0)
MCV: 103.2 fL — ABNORMAL HIGH (ref 80.0–100.0)
Platelets: 170 10*3/uL (ref 150–400)
RBC: 3.79 MIL/uL — ABNORMAL LOW (ref 4.22–5.81)
RDW: 13.4 % (ref 11.5–15.5)
WBC: 5.4 10*3/uL (ref 4.0–10.5)
nRBC: 0 % (ref 0.0–0.2)

## 2023-01-23 LAB — GLUCOSE, CAPILLARY
Glucose-Capillary: 68 mg/dL — ABNORMAL LOW (ref 70–99)
Glucose-Capillary: 73 mg/dL (ref 70–99)

## 2023-01-23 LAB — HIV ANTIBODY (ROUTINE TESTING W REFLEX): HIV Screen 4th Generation wRfx: NONREACTIVE

## 2023-01-23 LAB — CULTURE, BLOOD (ROUTINE X 2)
Special Requests: ADEQUATE
Special Requests: ADEQUATE

## 2023-01-23 MED ORDER — LACTULOSE ENEMA
300.0000 mL | Freq: Once | ORAL | Status: AC
  Administered 2023-01-23: 300 mL via RECTAL
  Filled 2023-01-23: qty 300

## 2023-01-23 MED ORDER — LORAZEPAM 2 MG/ML IJ SOLN: 1.0000 mg | INTRAMUSCULAR | Status: DC | PRN

## 2023-01-23 MED ORDER — LACTULOSE 10 GM/15ML PO SOLN
10.0000 g | Freq: Three times a day (TID) | ORAL | Status: DC
  Administered 2023-01-23 – 2023-01-24 (×2): 10 g via ORAL
  Filled 2023-01-23 (×2): qty 15

## 2023-01-23 MED ORDER — DOUBLE ANTIBIOTIC 500-10000 UNIT/GM EX OINT
1.0000 | TOPICAL_OINTMENT | Freq: Two times a day (BID) | CUTANEOUS | Status: DC
  Administered 2023-01-23 – 2023-01-27 (×9): 1 via TOPICAL
  Filled 2023-01-23: qty 28.4

## 2023-01-23 MED ORDER — DEXTROSE-SODIUM CHLORIDE 5-0.9 % IV SOLN
INTRAVENOUS | Status: DC
  Filled 2023-01-23 (×5): qty 1000

## 2023-01-23 NOTE — Progress Notes (Signed)
PROGRESS NOTE  [name]  DOB: 07-30-1951  PCP: Aviva Kluver ZOX:[id number]  DOA: 01/22/2023  LOS: 1 day  Hospital Day: 2  Brief narrative: [name] is a 72 y.o. 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  Also with history of depression, CKD, GERD, osteoporosis, basal cell cancer, severe spinal stenosis L4,5, advanced spinal stenosis L 3,4 and Cervical spinal stenosis followed at [facility], h/o right L4-5 facet cystectomy in 2017 Patient lives at home alone.  5/18, patient was brought to the ED by EMS after he was found on the floor unable to get up. Unclear circumstances of fall.  Apparently fell 3 days ago and could not get up.  Welfare check was issued by neighbor.  EMS found him on the floor facedown, lethargic and brought to the ED.  In the ED, afebrile, heart rate 120s, blood pressure 120s, breathing on room air. On exam, patient was arousable but slow to respond, unable to recall the details of fall. He was noted to have multiple facial injuries, swelling around right orbit with difficulty opening right eye.  He overall looked dry, had skin breakdown and multiple places in his body and feet.   Initial labs with normal WBC count, normal lactic acid, normal hemoglobin with MCV 104, BUN/creatinine elevated to 64/5.65, total CK 208, urinalysis unremarkable. Blood culture sent Skeletal survey done with CT head, CT maxillofacial, CT cervical thoracic, lumbar spine, CT chest abdomen pelvis, x-rays of the knee, shoulder. Significant findings being: CT head -no acute intracranial abnormality.  Chronic microvascular disease and atrophy CT maxillofacial with soft tissue swelling around right orbit and no facial or orbital fracture CT cervical/thoracic/lumbar spine -acute superior endplate fracture at T3 with 30% height loss, chronic compression fracture of T5 T9 and L1,  multilevel spondylosis of the lumbar spine EKG with SVT at 163.   Patient was given IV hydration, broad-spectrum IV antibiotics and 1 push of IV Cardizem Admitted to [facility].  Subjective: Patient was seen and examined this morning.  Elderly sick looking Caucasian male.  Propped up in bed.  Somnolent.  Unable to stay awake through the conversation.  Not in active pain.  No family at bedside. Chart reviewed Overnight, afebrile, heart rate in 80s this morning blood pressure in 120s, breathing on room air Plan set of labs from this morning with creatinine improving to 52/3.75  Assessment and plan: AKI on CKD 4 Chronic metabolic acidosis Baseline creatinine 2.55 from April 2023  Presented with creatinine elevated to 5.65.  Due to dehydration for 3 days after fall.  Gradually improving with IV fluid. Patient was apparently started in process for renal transplant at [facility] last August but isn't on the list yet.  Currently on half-normal saline at 100 mill per hour.  Looks dehydrated.  I will continue the same for today.  Continue to monitor renal function. Continue sodium bicarb Recent Labs    01/22/23 1123 01/23/23 0600  BUN 64* 52*  CREATININE 5.65* 3.75*   Chronic ongoing alcoholism H/o liver transplants twice (2004 and 2012) Does admit to drinking at least up into a day.  Also reports adherence to tacrolimus Watch out for withdrawal symptoms.  CIWA protocol.  Obtain ammonia level Continue tacrolimus Counseled to quit alcohol Continue PPI Recent Labs  Lab 01/22/23 1123 01/23/23 0600  AST 46*  --   ALT 31  --   ALKPHOS 91  --  BILITOT 1.4*  --   PROT 7.6  --   ALBUMIN 3.4*  --   INR 1.1  --   PLT 235 170   Acute metabolic encephalopathy Somnolent this morning.  Obtain ammonia level.  Minimize the use of sedatives. Continue monitor mental status change.  RN was also concerned about his swallowing.  Obtain speech eval.  SVT No history of arrhythmia Heart rate was elevated as  high as 160s in the ED.  Given a dose of IV Cardizem Started on oral Cardizem schedule on admission. It seems patient is on Lopressor at home.  I would stop Cardizem and resume blood pressure  Hypertension PTA list shows Lopressor 50 mg twice daily amlodipine 5 mg daily, HCTZ 12.5 mg daily, Florinef 0.1 mg daily.  Unclear if he was taking all of them.  Continue Lopressor for now.  Keep others on hold.  Impaired mobility  Falls Chronic back pain Spinal stenosis of lumbar region at multiple levels Follows up at [facility] spine center for severe stenosis L 45, advanced stenosis L 34.  Skeletal survey did not show any evidence of fracture  Currently has a back brace in place  Pending PT/OT eval back brace Roxicodone PRN, Robaxin PRN for pain control.  Right facial wound Continue local wound care with topical antibiotics  I did not see any acute evidence of infection.  I will stop the broad-spectrum antibiotics.  Home med list also shows Bactrim 1 tab daily MWF.  Unclear indication.   Mobility: Pending PT eval  Goals of care   Code Status: Full Code     DVT prophylaxis:  heparin injection 5,000 Units Start: 01/22/23 1630   Antimicrobials: Stop orders for antibiotics today Fluid: Currently on half NS at 100 mill per Consultants: None Family Communication: None at bedside  Status: Inpatient Level of care:  Telemetry Medical   Patient from: Home Anticipated d/c to: Pending clinical course Needs to continue in-hospital care:  Needs IV fluid, physical therapy, mental status monitoring     Diet:  Diet Order             Diet regular Room service appropriate? Yes with Assist; Fluid consistency: Thin  Diet effective now                   Scheduled Meds:  ferrous sulfate  325 mg Oral Q breakfast   fludrocortisone  0.1 mg Oral Daily   heparin  5,000 Units Subcutaneous Q8H   l-methylfolate-B6-B12  1 tablet Oral Daily   metoprolol tartrate  50 mg Oral BID   multivitamin  with minerals  1 tablet Oral Daily   pantoprazole  40 mg Oral Daily   polymixin-bacitracin   Topical BID   senna  1 tablet Oral BID   sodium bicarbonate  1,300 mg Oral BID   [START ON 01/24/2023] sulfamethoxazole-trimethoprim  1 tablet Oral Q M,W,F   tacrolimus  1 mg Oral BID   thiamine  100 mg Oral Daily   Or   thiamine  100 mg Intravenous Daily    PRN meds: acetaminophen **OR** acetaminophen, cyclobenzaprine, HYDROmorphone (DILAUDID) injection, LORazepam **OR** LORazepam, magnesium oxide, ondansetron   Infusions:   sodium chloride 100 mL/hr at 01/22/23 1855    Antimicrobials: Anti-infectives (From admission, onward)    Start     Dose/Rate Route Frequency Ordered Stop   01/24/23 1000  sulfamethoxazole-trimethoprim (BACTRIM) 400-80 MG per tablet 1 tablet       Note to Pharmacy: continuous  1 tablet Oral Every M-W-F 01/22/23 1546     01/23/23 1300  ceFEPIme (MAXIPIME) 1 g in sodium chloride 0.9 % 100 mL IVPB  Status:  Discontinued        1 g 200 mL/hr over 30 Minutes Intravenous Every 24 hours 01/22/23 1236 01/23/23 0949   01/22/23 1235  vancomycin variable dose per unstable renal function (pharmacist dosing)  Status:  Discontinued         Does not apply See admin instructions 01/22/23 1236 01/23/23 0949   01/22/23 1215  vancomycin (VANCOREADY) IVPB 1500 mg/300 mL        1,500 mg 150 mL/hr over 120 Minutes Intravenous  Once 01/22/23 1209 01/22/23 1558   01/22/23 1215  ceFEPIme (MAXIPIME) 2 g in sodium chloride 0.9 % 100 mL IVPB        2 g 200 mL/hr over 30 Minutes Intravenous  Once 01/22/23 1209 01/22/23 1404       Nutritional status:  Body mass index is 20.95 kg/m.          Objective: Vitals:   01/23/23 0500 01/23/23 0900  BP: 127/85 112/77  Pulse: 83 94  Resp: 20 18  Temp: 97.8 F (36.6 C) 98 F (36.7 C)  SpO2: 96%     Intake/Output Summary (Last 24 hours) at 01/23/2023 0954 Last data filed at 01/23/2023 0400 Gross per 24 hour  Intake 905.41 ml   Output 850 ml  Net 55.41 ml   Filed Weights   01/22/23 1104 01/22/23 1756  Weight: 72.6 kg 62.5 kg   Weight change:  Body mass index is 20.95 kg/m.   Physical Exam: General exam: Pleasant, elderly Caucasian male.  Chronic sick looking. Skin: Multiple rashes and abrasions in the body. HEENT: Atraumatic, normocephalic, no obvious bleeding.  Right facial area with wound as in the picture Lungs: Clear to auscultate bilaterally CVS: Regular rate and rhythm, no murmur GI/Abd soft, nontender, nondistended, bowel sound present CNS: Somnolent, open eyes on command.  Able to follow commands.  Speech slow and slurred  psychiatry: Sad affect Extremities: No edema, no calf tenderness  Data Review: I have personally reviewed the laboratory data and studies available.  F/u labs ordered Unresulted Labs (From admission, onward)     Start     Ordered   01/24/23 0500  Ammonia  Tomorrow morning,   R        01/23/23 0834   Unscheduled  CBC with Differential/Platelet  Daily,   R      01/23/23 0954   Unscheduled  Basic metabolic panel  Daily,   R      01/23/23 0954            Total time spent in review of labs and imaging, patient evaluation, formulation of plan, documentation and communication with family: 55 minutes  Signed, Lorin Glass, MD [facility] 01/23/2023

## 2023-01-23 NOTE — Evaluation (Signed)
Clinical/Bedside Swallow Evaluation Patient Details  Name: [name] MRN: [id number] Date of Birth: 1951-02-02  Today's Date: 01/23/2023 Time: SLP Start Time (ACUTE ONLY): 1340 SLP Stop Time (ACUTE ONLY): 1350 SLP Time Calculation (min) (ACUTE ONLY): 10 min  Past Medical History:  Past Medical History:  Diagnosis Date   ALCOHOL ABUSE, HX OF    ANEMIA, IRON DEFICIENCY    DERMATITIS    Hepatic encephalopathy (HCC)    HEPATIC FAILURE, END STAGE 2004   liver transplant 2004; 2nd orthotopic liver transplant 01/25/11 [facility] [phone] dr. Fayrene Fearing dewar   Hypertension    THROMBOCYTOPENIA    Past Surgical History:  Past Surgical History:  Procedure Laterality Date   (L) arm broke     Broke (L) ankle   2001   HERNIA REPAIR     LIVER TRANSPLANT  1st 04/21/03; 2nd 01/25/11   @ [facility] dr. Elmer Picker [phone];  [phone]   HPI:  [name] is a pleasant 72 year old male with medical history significant for 2 liver transplants the first in 2004 and a repeat in 2012, GERD, hypertension, history of alcohol abuse, osteoporosis, history of basal cell carcinoma, and history of a right L4-5 facet cystectomy in 2017, CKD III, depression. By report he fell at home 3 days PTA and was immobilized. EMS activated and patient transported to [facility] for evaluation. By record review he does have severe spinal stenosis L4,5, advanced spinal stenosis L 3,4 and Cervical spinal stenosis followed at [facility]. Found with AKI, AMS, severe coughing with liquid intake.    Assessment / Plan / Recommendation  Clinical Impression  Pt presents with reduced alertness upon SLP arrival. RN at bedside checking glucose levels (which were low at 68). Pt required cues for alertness including sternal rub. He was agitated and did not want to participate in PO intake, but agreed with encouragement. He did not participate in oral mech exam; however, structures appeared adequate for intake. RN reports  prior SEVERE choking episode this morning on water where he was gasping for air and "turned blue." Vitals returned to normal shortly thereafter. He demonstrated severe coughing again with water intake via cup. No s/s aspiration with ice chips or VERY small boluses of puree (he would only take tiny boluses). Solids were trialed with pt volitionally expectorating, so evaluation was discontinued. Given the choking episode with RN and again severe coughing with SLP, pt should remain NPO until more alert and can participate in re-evaluation by SLP. RN in agreement. If meds must be given (and he will allow for it) they may be crushed in puree pending re-evaluation.   SLP Visit Diagnosis: Dysphagia, oropharyngeal phase (R13.12)    Aspiration Risk  Severe aspiration risk;Risk for inadequate nutrition/hydration    Diet Recommendation NPO   Medication Administration: Crushed with puree Supervision: Full supervision/cueing for compensatory strategies    Other  Recommendations Oral Care Recommendations: Oral care QID    Recommendations for follow up therapy are one component of a multi-disciplinary discharge planning process, led by the attending physician.  Recommendations may be updated based on patient status, additional functional criteria and insurance authorization.  Follow up Recommendations Follow physician's recommendations for discharge plan and follow up therapies      Functional Status Assessment Patient has had a recent decline in their functional status and demonstrates the ability to make significant improvements in function in a reasonable and predictable amount of time.  Frequency and Duration min 2x/week  2 weeks  Prognosis Prognosis for improved oropharyngeal function: Fair Barriers to Reach Goals: Cognitive deficits      Swallow Study   General Date of Onset: 01/23/23 HPI: [name] is a pleasant 72 year old male with medical history significant for 2 liver  transplants the first in 2004 and a repeat in 2012, GERD, hypertension, history of alcohol abuse, osteoporosis, history of basal cell carcinoma, and history of a right L4-5 facet cystectomy in 2017, CKD III, depression. By report he fell at home 3 days PTA and was immobilized. EMS activated and patient transported to [facility] for evaluation. By record review he does have severe spinal stenosis L4,5, advanced spinal stenosis L 3,4 and Cervical spinal stenosis followed at [facility]. Found with AKI, AMS, severe coughing with liquid intake. Type of Study: Bedside Swallow Evaluation Previous Swallow Assessment: Swallow Screen by nurse: FAILED Diet Prior to this Study: NPO Temperature Spikes Noted: No Respiratory Status: Room air Behavior/Cognition: Lethargic/Drowsy;Requires cueing Oral Care Completed by SLP: No Patient Positioning: Upright in bed Baseline Vocal Quality: Normal Volitional Cough: Cognitively unable to elicit Volitional Swallow: Unable to elicit    Oral/Motor/Sensory Function Overall Oral Motor/Sensory Function: Generalized oral weakness   Ice Chips Ice chips: Within functional limits Presentation: Spoon   Thin Liquid Thin Liquid: Impaired Presentation: Straw Pharyngeal  Phase Impairments: Cough - Immediate    Puree Puree: Impaired Presentation: Spoon Pharyngeal Phase Impairments: Multiple swallows   Solid     Solid: Impaired Other Comments: expectorated     [name] P. [name], M.S., CCC-SLP Speech-Language Pathologist Acute Rehabilitation Services Pager: [phone]  [name] [name] 01/23/2023,2:02 PM

## 2023-01-23 NOTE — Evaluation (Signed)
Occupational Therapy Evaluation Patient Details Name: [name] MRN: [id number] DOB: 05-04-1951 Today's Date: 01/23/2023   History of Present Illness Pt is a 72 y/o M presenting to ED on 5/18 after fall at home, was immobilized for 3 days, admitted for AKI. Workup revealing severe spinal stenosis L4-5, and advanced spinal stenosis L3-4, nondispalced fx of R 3rd metacarpal. PMH includes 2 liver transplants, GERD, HTN, alcohol abuse, osteoporosis, basal cell carcinoma.   Clinical Impression   Pt questionable historian, reports independence at baseline with ADLs, although reports "it takes a while". Pt with decreased cognition, oriented to self only at time of OT evaluation. Pt currently needing min-total A for ADLs, mod-max A +2 for bed mobility,and mod A +2 for sit to stand transfer with RW. Pt on 2L O2 upon arrival, desatting to high 70's-high 80's during session, increased to 4L O2 and pt satting 85-88%, left pt on 4L O2 at end of session per RN request. Pt presenting with impairments listed below, will follow acutely. Patient will benefit from continued inpatient follow up therapy, <3 hours/day to maximize safety and independence with ADLs/functional mobility.       Recommendations for follow up therapy are one component of a multi-disciplinary discharge planning process, led by the attending physician.  Recommendations may be updated based on patient status, additional functional criteria and insurance authorization.   Assistance Recommended at Discharge Frequent or constant Supervision/Assistance  Patient can return home with the following Two people to help with walking and/or transfers;A lot of help with bathing/dressing/bathroom;Assistance with cooking/housework;Direct supervision/assist for medications management;Direct supervision/assist for financial management;Assist for transportation;Help with stairs or ramp for entrance    Functional Status Assessment  Patient has had a  recent decline in their functional status and demonstrates the ability to make significant improvements in function in a reasonable and predictable amount of time.  Equipment Recommendations  Other (comment) (defer)    Recommendations for Other Services PT consult     Precautions / Restrictions Precautions Precautions: Fall;Back Precaution Comments: maintained back precautions during session Required Braces or Orthoses: Spinal Brace Spinal Brace: Thoracolumbosacral orthotic Restrictions Weight Bearing Restrictions: No      Mobility Bed Mobility Overal bed mobility: Needs Assistance Bed Mobility: Sidelying to Sit, Sit to Sidelying, Rolling Rolling: Mod assist Sidelying to sit: Mod assist, +2 for physical assistance, Max assist     Sit to sidelying: Mod assist, +2 for physical assistance, Max assist      Transfers Overall transfer level: Needs assistance Equipment used: Rolling walker (2 wheels) Transfers: Sit to/from Stand Sit to Stand: Mod assist, +2 physical assistance                  Balance Overall balance assessment: Needs assistance Sitting-balance support: Feet supported Sitting balance-Leahy Scale: Fair     Standing balance support: During functional activity, Reliant on assistive device for balance Standing balance-Leahy Scale: Poor Standing balance comment: reliant on RW support                           ADL either performed or assessed with clinical judgement   ADL Overall ADL's : Needs assistance/impaired Eating/Feeding: NPO   Grooming: Minimal assistance;Sitting;Bed level   Upper Body Bathing: Moderate assistance;Bed level;Sitting   Lower Body Bathing: Maximal assistance;Sitting/lateral leans;Bed level   Upper Body Dressing : Moderate assistance;Sitting;Bed level   Lower Body Dressing: Maximal assistance;Sitting/lateral leans;Bed level   Toilet Transfer: Moderate assistance;+2 for physical assistance   Toileting-  Clothing  Manipulation and Hygiene: Total assistance Toileting - Clothing Manipulation Details (indicate cue type and reason): catheter     Functional mobility during ADLs: Moderate assistance;+2 for physical assistance;Rolling walker (2 wheels)       Vision   Additional Comments: will further assess     Perception Perception Perception Tested?: No   Praxis Praxis Praxis tested?: Not tested    Pertinent Vitals/Pain Pain Assessment Pain Assessment: No/denies pain     Hand Dominance     Extremity/Trunk Assessment Upper Extremity Assessment Upper Extremity Assessment: Generalized weakness (limited shoulder AROM, globally weak, R 3rd and 4th digits splinted, nondisplaced fx of 3rd metacarpal)   Lower Extremity Assessment Lower Extremity Assessment: Defer to PT evaluation   Cervical / Trunk Assessment Cervical / Trunk Assessment: Kyphotic;Other exceptions (spinal stenosis)   Communication Communication Communication: No difficulties   Cognition Arousal/Alertness: Lethargic Behavior During Therapy: Flat affect (though more alert once seated EOB) Overall Cognitive Status: Impaired/Different from baseline Area of Impairment: Attention, Following commands, Safety/judgement, Awareness, Problem solving, Orientation                 Orientation Level: Situation, Time, Place Current Attention Level: Focused   Following Commands: Follows one step commands with increased time Safety/Judgement: Decreased awareness of safety, Decreased awareness of deficits Awareness: Emergent (reports need to urinate) Problem Solving: Slow processing       General Comments  pt on 2L O2 upon arrival, when checked, SpO2 as low as 79% with bed mobility and standing attempts, increased pt to 4L O2 and pt fluctuating between 85-88% on 4L O2, RN notified and stated to leave pt on 4L O2    Exercises     Shoulder Instructions      Home Living Family/patient expects to be discharged to:: Private  residence Living Arrangements: Spouse/significant other Available Help at Discharge: Family Type of Home: House       Home Layout: Two level;Able to live on main level with bedroom/bathroom Alternate Level Stairs-Number of Steps: 14   Bathroom Shower/Tub: Walk-in shower             Additional Comments: Much of PLOF and home setup/available assist will need to be verified      Prior Functioning/Environment Prior Level of Function : Needs assist               ADLs Comments: reports ind, but "it takes a while"        OT Problem List: Decreased range of motion;Decreased strength;Decreased activity tolerance;Impaired balance (sitting and/or standing);Decreased cognition;Decreased safety awareness;Cardiopulmonary status limiting activity;Decreased knowledge of precautions;Decreased coordination;Impaired UE functional use      OT Treatment/Interventions: Therapeutic exercise;Self-care/ADL training;Energy conservation;DME and/or AE instruction;Therapeutic activities;Patient/family education;Balance training;Cognitive remediation/compensation;Visual/perceptual remediation/compensation    OT Goals(Current goals can be found in the care plan section) Acute Rehab OT Goals Patient Stated Goal: none stated OT Goal Formulation: With patient Time For Goal Achievement: 02/06/23 Potential to Achieve Goals: Good ADL Goals Pt Will Perform Upper Body Dressing: with min guard assist;sitting Pt Will Perform Lower Body Dressing: with min guard assist;sitting/lateral leans;sit to/from stand Pt Will Transfer to Toilet: with min assist;squat pivot transfer;stand pivot transfer;bedside commode Additional ADL Goal #1: pt will follow 2 step command in prep for ADLs  OT Frequency: Min 1X/week    Co-evaluation PT/OT/SLP Co-Evaluation/Treatment: Yes Reason for Co-Treatment: Complexity of the patient's impairments (multi-system involvement);For patient/therapist safety;To address functional/ADL  transfers   OT goals addressed during session: ADL's and self-care  AM-PAC OT "6 Clicks" Daily Activity     Outcome Measure Help from another person eating meals?:  (NPO) Help from another person taking care of personal grooming?: A Little Help from another person toileting, which includes using toliet, bedpan, or urinal?: A Lot Help from another person bathing (including washing, rinsing, drying)?: A Lot Help from another person to put on and taking off regular upper body clothing?: A Lot Help from another person to put on and taking off regular lower body clothing?: A Lot 6 Click Score: 11   End of Session Equipment Utilized During Treatment: Rolling walker (2 wheels);Gait belt;Back brace;Oxygen (2-4L O2) Nurse Communication: Mobility status;Other (comment) (SpO2 drop)  Activity Tolerance: Patient limited by fatigue Patient left: in bed;with call bell/phone within reach;with bed alarm set  OT Visit Diagnosis: Unsteadiness on feet (R26.81);Other abnormalities of gait and mobility (R26.89);Muscle weakness (generalized) (M62.81);History of falling (Z91.81);Repeated falls (R29.6)                Time: 7829-5621 OT Time Calculation (min): 33 min Charges:  OT General Charges $OT Visit: 1 Visit OT Evaluation $OT Eval Moderate Complexity: 1 Mod  Ariela Mochizuki K, OTD, OTR/L SecureChat Preferred Acute Rehab [phone]   Dailen Mcclish K Koonce 01/23/2023, 4:00 PM

## 2023-01-23 NOTE — Evaluation (Signed)
Physical Therapy Evaluation Patient Details Name: [name] MRN: [id number] DOB: Dec 03, 1950 Today's Date: 01/23/2023  History of Present Illness  Pt is a 72 y/o M presenting to ED on 5/18 after fall at home, was immobilized for 3 days, admitted for AKI. Workup revealing severe spinal stenosis L4-5, and advanced spinal stenosis L3-4, nondispalced fx of R 3rd metacarpal. PMH includes 2 liver transplants, GERD, HTN, alcohol abuse, osteoporosis, basal cell carcinoma.  Clinical Impression   Pt admitted with above diagnosis. Lives at home alone, in a 2-level home with unknown amount of steps to enter; Pt is a poor historian, and we will need a clearer picture of is prior level of function and home setup at some point; Presents to PT with altered mental status, generalized weakness, functional dependencies, and decr oxygen saturation, even with supplemental O2 bumped up from 2 L to 4L during session; mod-max A +2 for bed mobility,and mod A +2 for sit to stand transfer with RW. Pt on 2L O2 upon arrival, desatting to high 70's-high 80's during session, increased to 4L O2 and pt satting 85-88%, left pt on 4L O2 at end of session per RN request;  Pt currently with functional limitations due to the deficits listed below (see PT Problem List). Pt will benefit from skilled PT to increase their independence and safety with mobility to allow discharge to the venue listed below.          Recommendations for follow up therapy are one component of a multi-disciplinary discharge planning process, led by the attending physician.  Recommendations may be updated based on patient status, additional functional criteria and insurance authorization.  Follow Up Recommendations Can patient physically be transported by private vehicle: No     Assistance Recommended at Discharge Frequent or constant Supervision/Assistance  Patient can return home with the following       Equipment Recommendations Rolling walker (2  wheels);BSC/3in1  Recommendations for Other Services       Functional Status Assessment Patient has had a recent decline in their functional status and demonstrates the ability to make significant improvements in function in a reasonable and predictable amount of time.     Precautions / Restrictions Precautions Precautions: Fall;Back Precaution Comments: maintained back precautions during session Required Braces or Orthoses: Spinal Brace Spinal Brace: Thoracolumbosacral orthotic Restrictions Weight Bearing Restrictions: No      Mobility  Bed Mobility Overal bed mobility: Needs Assistance Bed Mobility: Sidelying to Sit, Sit to Sidelying, Rolling Rolling: Mod assist Sidelying to sit: Mod assist, +2 for physical assistance, Max assist     Sit to sidelying: Mod assist, +2 for physical assistance, Max assist General bed mobility comments: Following commands related to rolling and reaching for bedrail well; heavy mod assist to elevate trunk to sit    Transfers Overall transfer level: Needs assistance Equipment used: Rolling walker (2 wheels) Transfers: Sit to/from Stand, Bed to chair/wheelchair/BSC Sit to Stand: Mod assist, +2 physical assistance   Step pivot transfers: Mod assist, +2 safety/equipment       General transfer comment: Light mod assist to power up to stand; +2 assist for bilateral suport as pt took sidesteps towards [facility] with RW    Ambulation/Gait                  Stairs            Wheelchair Mobility    Modified Rankin (Stroke Patients Only)       Balance Overall balance assessment: Needs assistance Sitting-balance  support: Feet supported Sitting balance-Leahy Scale: Fair     Standing balance support: During functional activity, Reliant on assistive device for balance Standing balance-Leahy Scale: Poor Standing balance comment: reliant on RW support                             Pertinent Vitals/Pain Pain Assessment Pain  Assessment: No/denies pain    Home Living Family/patient expects to be discharged to:: Private residence Living Arrangements: Spouse/significant other Available Help at Discharge: Family Type of Home: House       Alternate Level Stairs-Number of Steps: 14 Home Layout: Two level;Able to live on main level with bedroom/bathroom   Additional Comments: Much of PLOF and home setup/available assist will need to be verified    Prior Function Prior Level of Function : Needs assist               ADLs Comments: reports ind, but "it takes a while"     Hand Dominance        Extremity/Trunk Assessment   Upper Extremity Assessment Upper Extremity Assessment: Defer to OT evaluation    Lower Extremity Assessment Lower Extremity Assessment: Generalized weakness    Cervical / Trunk Assessment Cervical / Trunk Assessment: Kyphotic;Other exceptions (spinal stenosis)  Communication   Communication: No difficulties  Cognition Arousal/Alertness: Lethargic Behavior During Therapy: Flat affect (though more alert once seated EOB) Overall Cognitive Status: Impaired/Different from baseline Area of Impairment: Attention, Following commands, Safety/judgement, Awareness, Problem solving, Orientation                 Orientation Level: Situation, Time, Place Current Attention Level: Focused   Following Commands: Follows one step commands with increased time Safety/Judgement: Decreased awareness of safety, Decreased awareness of deficits Awareness: Emergent (reports need to urinate) Problem Solving: Slow processing          General Comments General comments (skin integrity, edema, etc.): pt on 2L O2 upon arrival, when checked, SpO2 as low as 79% with bed mobility and standing attempts, increased pt to 4L O2 and pt fluctuating between 85-88% on 4L O2, RN notified and stated to leave pt on 4L O2; suboptimal fit of brace    Exercises     Assessment/Plan    PT Assessment Patient  needs continued PT services  PT Problem List Decreased strength;Decreased range of motion;Decreased activity tolerance;Decreased balance;Decreased mobility;Decreased coordination;Decreased cognition;Decreased knowledge of use of DME;Decreased safety awareness;Decreased knowledge of precautions;Cardiopulmonary status limiting activity;Decreased skin integrity       PT Treatment Interventions DME instruction;Gait training;Functional mobility training;Therapeutic activities;Therapeutic exercise;Balance training;Neuromuscular re-education;Cognitive remediation;Patient/family education    PT Goals (Current goals can be found in the Care Plan section)  Acute Rehab PT Goals Patient Stated Goal: Did not state, but agreeable to stand at EOB PT Goal Formulation: Patient unable to participate in goal setting Time For Goal Achievement: 02/06/23 Potential to Achieve Goals: Good    Frequency Min 2X/week     Co-evaluation   Reason for Co-Treatment: Complexity of the patient's impairments (multi-system involvement);For patient/therapist safety;To address functional/ADL transfers   OT goals addressed during session: ADL's and self-care       AM-PAC PT "6 Clicks" Mobility  Outcome Measure Help needed turning from your back to your side while in a flat bed without using bedrails?: A Little Help needed moving from lying on your back to sitting on the side of a flat bed without using bedrails?: A Lot Help needed moving to  and from a bed to a chair (including a wheelchair)?: A Lot Help needed standing up from a chair using your arms (e.g., wheelchair or bedside chair)?: A Lot Help needed to walk in hospital room?: A Lot Help needed climbing 3-5 steps with a railing? : Total 6 Click Score: 12    End of Session Equipment Utilized During Treatment: Gait belt;Back brace Activity Tolerance: Patient tolerated treatment well Patient left: in bed;with call bell/phone within reach;with bed alarm set Nurse  Communication: Mobility status;Other (comment) (need to incr supplemental O2 to 4L) PT Visit Diagnosis: Unsteadiness on feet (R26.81);Other abnormalities of gait and mobility (R26.89);History of falling (Z91.81);Muscle weakness (generalized) (M62.81);Other symptoms and signs involving the nervous system (R29.898)    Time: 8119-1478 PT Time Calculation (min) (ACUTE ONLY): 32 min   Charges:   PT Evaluation $PT Eval Moderate Complexity: 1 Mod          Van Clines, PT  Acute Rehabilitation Services Office [phone] Secure Chat welcomed   Levi Aland 01/23/2023, 4:41 PM

## 2023-01-24 DIAGNOSIS — N179 Acute kidney failure, unspecified: Secondary | ICD-10-CM | POA: Diagnosis not present

## 2023-01-24 LAB — BASIC METABOLIC PANEL
Anion gap: 9 (ref 5–15)
BUN: 45 mg/dL — ABNORMAL HIGH (ref 8–23)
CO2: 22 mmol/L (ref 22–32)
Calcium: 8.3 mg/dL — ABNORMAL LOW (ref 8.9–10.3)
Chloride: 110 mmol/L (ref 98–111)
Creatinine, Ser: 3.1 mg/dL — ABNORMAL HIGH (ref 0.61–1.24)
GFR, Estimated: 21 mL/min — ABNORMAL LOW (ref >60)
Glucose, Bld: 143 mg/dL — ABNORMAL HIGH (ref 70–99)
Potassium: 3.2 mmol/L — ABNORMAL LOW (ref 3.5–5.1)
Sodium: 141 mmol/L (ref 135–145)

## 2023-01-24 LAB — CBC WITH DIFFERENTIAL/PLATELET
Abs Immature Granulocytes: 0.01 10*3/uL (ref 0.00–0.07)
Basophils Absolute: 0 10*3/uL (ref 0.0–0.1)
Basophils Relative: 0 %
Eosinophils Absolute: 0 10*3/uL (ref 0.0–0.5)
Eosinophils Relative: 1 %
HCT: 34.8 % — ABNORMAL LOW (ref 39.0–52.0)
Hemoglobin: 11.1 g/dL — ABNORMAL LOW (ref 13.0–17.0)
Immature Granulocytes: 0 %
Lymphocytes Relative: 11 %
Lymphs Abs: 0.5 10*3/uL — ABNORMAL LOW (ref 0.7–4.0)
MCH: 32.6 pg (ref 26.0–34.0)
MCHC: 31.9 g/dL (ref 30.0–36.0)
MCV: 102.4 fL — ABNORMAL HIGH (ref 80.0–100.0)
Monocytes Absolute: 0.4 10*3/uL (ref 0.1–1.0)
Monocytes Relative: 8 %
Neutro Abs: 3.6 10*3/uL (ref 1.7–7.7)
Neutrophils Relative %: 80 %
Platelets: 158 10*3/uL (ref 150–400)
RBC: 3.4 MIL/uL — ABNORMAL LOW (ref 4.22–5.81)
RDW: 13.3 % (ref 11.5–15.5)
WBC: 4.5 10*3/uL (ref 4.0–10.5)
nRBC: 0 % (ref 0.0–0.2)

## 2023-01-24 LAB — AMMONIA: Ammonia: 19 umol/L (ref 9–35)

## 2023-01-24 LAB — CULTURE, BLOOD (ROUTINE X 2)

## 2023-01-24 MED ORDER — CHLORHEXIDINE GLUCONATE CLOTH 2 % EX PADS
6.0000 | MEDICATED_PAD | Freq: Every day | CUTANEOUS | Status: DC
  Administered 2023-01-24 – 2023-01-27 (×3): 6 via TOPICAL

## 2023-01-24 MED ORDER — LACTULOSE 10 GM/15ML PO SOLN
10.0000 g | Freq: Every day | ORAL | Status: DC
  Administered 2023-01-25 – 2023-01-27 (×3): 10 g via ORAL
  Filled 2023-01-24 (×3): qty 15

## 2023-01-24 NOTE — [place] FL2 (Signed)
[place] MEDICAID FL2 LEVEL OF CARE FORM     IDENTIFICATION  Patient Name: [name] Birthdate: [birth year] Sex: male Admission Date (Current Location): 01/22/2023  [facility] [employer] and [place] Number:  Producer, television/film/video and Address:  [address]. [facility], [address], [place], [place] [address]      Provider Number: [id number]  Attending Physician Name and Address:  Lorin Glass, MD  Relative Name and Phone Number:  Malachy Mood (Other) [phone]    Current Level of Care: Hospital Recommended Level of Care: Skilled Nursing Facility Prior Approval Number:    Date Approved/Denied:   PASRR Number: pending  Discharge Plan: SNF    Current Diagnoses: Patient Active Problem List   Diagnosis Date Noted   CKD (chronic kidney disease) stage 3, GFR 30-59 ml/min (HCC) 01/22/2023   AKI (acute kidney injury) (HCC) 01/22/2023   SVT (supraventricular tachycardia) 01/22/2023   Nondisplaced fracture of base of third metacarpal bone, right hand, initial encounter for closed fracture 01/22/2023   Spinal stenosis of lumbar region at multiple levels 01/22/2023   Hypertension 05/04/2011   History of liver transplant (HCC) 01/11/2011   ANEMIA, IRON DEFICIENCY 10/16/2010   DERMATITIS 10/16/2010   COLONIC POLYPS, HX OF 10/16/2010   THROMBOCYTOPENIA 10/14/2010   ALCOHOL ABUSE, HX OF 10/14/2010    Orientation RESPIRATION BLADDER Height & Weight     Self, Time, Situation, Place  Normal Continent Weight: 137 lb 12.6 oz (62.5 kg) Height:  5\' 8"  (172.7 cm)  BEHAVIORAL SYMPTOMS/MOOD NEUROLOGICAL BOWEL NUTRITION STATUS      Incontinent Diet (see d/c summary)  AMBULATORY STATUS COMMUNICATION OF NEEDS Skin   Extensive Assist Verbally Skin abrasions (Skin tear eyes (reddend))                       Personal Care Assistance Level of Assistance  Bathing, Feeding, Dressing Bathing Assistance: Limited assistance Feeding assistance: Independent Dressing Assistance:  Limited assistance     Functional Limitations Info  Sight, Hearing, Speech Sight Info: Impaired Hearing Info: Adequate Speech Info: Adequate    SPECIAL CARE FACTORS FREQUENCY  PT (By licensed PT), OT (By licensed OT)     PT Frequency: 5x/ week OT Frequency: 5x/ week            Contractures Contractures Info: Not present    Additional Factors Info  Code Status, Allergies Code Status Info: Full Allergies Info: Tape  Zolpidem  Moxifloxacin           Current Medications (01/24/2023):  This is the current hospital active medication list Current Facility-Administered Medications  Medication Dose Route Frequency Provider Last Rate Last Admin   acetaminophen (TYLENOL) tablet 650 mg  650 mg Oral Q6H PRN Norins, Rosalyn Gess, MD   650 mg at 01/23/23 2122   Or   acetaminophen (TYLENOL) suppository 650 mg  650 mg Rectal Q6H PRN Norins, Rosalyn Gess, MD       Chlorhexidine Gluconate Cloth 2 % PADS 6 each  6 each Topical Daily Dahal, Binaya, MD       cyclobenzaprine (FLEXERIL) tablet 10 mg  10 mg Oral BID PRN Norins, Rosalyn Gess, MD       dextrose 5 %-0.9 % sodium chloride infusion   Intravenous Continuous Dahal, Binaya, MD 75 mL/hr at 01/23/23 1430 New Bag at 01/23/23 1430   ferrous sulfate tablet 325 mg  325 mg Oral Q breakfast Norins, Rosalyn Gess, MD   325 mg at 01/24/23 0853   fludrocortisone (FLORINEF)  tablet 0.1 mg  0.1 mg Oral Daily Norins, Rosalyn Gess, MD   0.1 mg at 01/24/23 9147   heparin injection 5,000 Units  5,000 Units Subcutaneous Q8H Norins, Rosalyn Gess, MD   5,000 Units at 01/24/23 8295   HYDROmorphone (DILAUDID) injection 0.5-1 mg  0.5-1 mg Intravenous Q2H PRN Norins, Rosalyn Gess, MD       l-methylfolate-B6-B12 [facility]) 3-35-2 MG per tablet 1 tablet  1 tablet Oral Daily Norins, Rosalyn Gess, MD   1 tablet at 01/24/23 0853   [START ON 01/25/2023] lactulose (CHRONULAC) 10 GM/15ML solution 10 g  10 g Oral Daily Dahal, Binaya, MD       LORazepam (ATIVAN) injection 1 mg  1 mg Intravenous Q4H  PRN Dahal, Melina Schools, MD       LORazepam (ATIVAN) tablet 1-4 mg  1-4 mg Oral Q1H PRN Jacques Navy, MD   1 mg at 01/23/23 6213   Or   LORazepam (ATIVAN) tablet 0.5 mg  0.5 mg Oral Q1H PRN Jacques Navy, MD   0.5 mg at 01/23/23 2258   magnesium oxide (MAG-OX) tablet 400 mg  400 mg Oral TID PRN Norins, Rosalyn Gess, MD       metoprolol tartrate (LOPRESSOR) tablet 50 mg  50 mg Oral BID Jacques Navy, MD   50 mg at 01/24/23 0901   multivitamin with minerals tablet 1 tablet  1 tablet Oral Daily Norins, Rosalyn Gess, MD   1 tablet at 01/24/23 0853   ondansetron (ZOFRAN-ODT) disintegrating tablet 4 mg  4 mg Oral Q8H PRN Jacques Navy, MD   4 mg at 01/23/23 1806   pantoprazole (PROTONIX) EC tablet 40 mg  40 mg Oral Daily Norins, Rosalyn Gess, MD   40 mg at 01/24/23 0865   polymixin-bacitracin (POLYSPORIN) ointment 1 Application  1 Application Topical BID Lorin Glass, MD   1 Application at 01/24/23 0855   senna (SENOKOT) tablet 8.6 mg  1 tablet Oral BID Jacques Navy, MD   8.6 mg at 01/24/23 0901   sodium bicarbonate tablet 1,300 mg  1,300 mg Oral BID Jacques Navy, MD   1,300 mg at 01/24/23 7846   sulfamethoxazole-trimethoprim (BACTRIM) 400-80 MG per tablet 1 tablet  1 tablet Oral Q M,W,F Norins, Rosalyn Gess, MD   1 tablet at 01/24/23 0853   tacrolimus (PROGRAF) capsule 1 mg  1 mg Oral BID Jacques Navy, MD   1 mg at 01/24/23 9629   thiamine (VITAMIN B1) tablet 100 mg  100 mg Oral Daily Jacques Navy, MD   100 mg at 01/24/23 5284     Discharge Medications: Please see discharge summary for a list of discharge medications.  Relevant Imaging Results:  Relevant Lab Results:   Additional Information SSN: [id number] F Estella Malatesta, LCSWA

## 2023-01-24 NOTE — TOC CAGE-AID Note (Signed)
Transition of Care [facility]) - CAGE-AID Screening   Patient Details  Name: [name] MRN: [id number] Date of Birth: Jul 10, 1951  Transition of Care [facility]) CM/SW Contact:    Erin Sons, LCSW Phone Number: 01/24/2023, 11:07 AM   Clinical Narrative:  CAGE-AID completed; score of 3. Pt states he has been living back and forth between [place] and [place] for last 5 years. He currently lives with his girlfriend in [place]. Pt reports drinking 1 pint of liquor/day; he denies any other substance use. Pt is ambivalent about treatment for alcohol use. Pt seems to have participated in an IOP or PHP program in the past(he reports going to long groups multiple times/week) though states it was not helpful and others in the group did not take it seriously. He is accepting of resources and CSW explains the different levels of care on resource list.   CAGE-AID Screening:    Have You Ever Felt You Ought to Cut Down on Your Drinking or Drug Use?: Yes Have People Annoyed You By Critizing Your Drinking Or Drug Use?: Yes Have You Felt Bad Or Guilty About Your Drinking Or Drug Use?: Yes Have You Ever Had a Drink or Used Drugs First Thing In The Morning to Steady Your Nerves or to Get Rid of a Hangover?: No CAGE-AID Score: 3  Substance Abuse Education Offered: Yes  Substance abuse interventions: Patient Counseling, Transport planner

## 2023-01-24 NOTE — TOC Initial Note (Addendum)
Transition of Care [facility]) - Initial/Assessment Note    Patient Details  Name: [name] MRN: [id number] Date of Birth: 02-28-1951  Transition of [facility]) CM/SW Contact:    Ralene Bathe, LCSWA Phone Number: 01/24/2023, 11:17 AM  Clinical Narrative:                 CSW received consult for possible SNF placement at time of discharge.  Patient expressed understanding of PT recommendation and is partially agreeable to SNF placement at time of discharge. The patient requested that this writer contact his significant other, [name], to discuss discharge planing.  Patient reports preference for a facility in [place]. CSW discussed insurance authorization process and will provide Medicare SNF ratings list. CSW will send out referrals for review and provide bed offers as available.   Addendum 1400-  LCSW contacted the patient's significant other, [name], to discuss discharge planning as requested by the patient.  The significant other reports that she is currently in the hospital and unable to assist at this time.  Skilled Nursing Rehab Facilities-   [email or web address]   Ratings out of 5 stars (5 the highest)   Name Address  Phone # Quality Care Staffing Health Inspection Overall  [facility] [address] [phone] 2 1 5 4   [facility] [address], [place] [phone] 4 1 3 2   [facility] [address], [place]inette Otto [phone] 2 1 1 1   [facility] [address], [place] [phone] 4 1 3 2   [facility]  [address], [place] [phone] 3 2 5 5   [facility] [address] [facility] [phone] 2 1 2 1   [facility] [address] [place] [phone] 4 1 2 1   [facility] [address] [place] [phone] 2 4 3 3   [address] ([place]) [address] [place] [phone] 3 2 2 2   [facility] [address] [place], [place]  [phone] 1 2 1 1   [facility]) [address], [place] [phone] 3 1 1 1   [facility]igh[facility] [address] Liliane Shi [phone] 4 3 4 4   [facility] [address], [place] [phone] 3 4 3 3           [facility] [address], [place] [phone]      [facility], [place] [address], [place] [phone] 1 1 2 1   [facility] [address] [place] [phone] 2 2 4 4   [facility] [address]Cheree Ditto [phone] 2 1 4 3   [facility] [address], [place] [phone] 3 3 3 3           [address] (no [facility]) [address], [place] [phone] 4 4 5 5   [facility] (No [employer]) [address] [place], [place] [phone] 2 2 4 4   [facility] [address], [place] [phone] 2 1 2 1   [facility] (No UHC) [address], [place] [phone] 5 5 5 5   [facility] [address] [facility] [phone] 2 3 5 5   [facility] [address] [place] [phone] 2 1 1 1   [facility] [address] Liliane Shi [phone] 5 2 5 5   [facility]  [address] [place] [phone] 2 2 2 2   [facility] [address] [place] [phone] 4 2 1 1   [facility] [address] [place], [place] [phone] 2 2 3  3  [facility] [address] [place] [phone] 1 1 1 1   [facility] [address] Evlyn Clines  [phone] 2 3 3 3   [facility] (No Humana) [address], [place] [phone] 2 1 3 2   [facility]) [address], [place] Levan [phone] 1 1 1 1   [facility] [address], [place] Levan [phone] 3 2 5 5   [facility] [place] [address], [place] [phone] 2 1 1 1           [facility] [address] [place], [place] [phone] 4 4 5 5   [facility])  [address] [place] [phone] 2 1 2 1   [facility]) [address], [place] [phone]  1 4 3   [facility] [address] [place] [phone] 3 5 4 5   [address] [place], [place] [phone] 3 2 2 2   [facility]) [address] [place] [phone] 2 1 3 2     Expected Discharge Plan: Skilled Nursing Facility Barriers to Discharge: Insurance Authorization, Continued Medical Work up, SNF Pending bed offer   Patient Goals and CMS Choice   CMS Medicare.gov Compare Post Acute Care list provided to:: Patient Choice offered to / list presented to : Patient      Expected Discharge Plan and Services In-house Referral: Clinical Social Work     Living arrangements for the past 2 months: Apartment                                      Prior Living Arrangements/Services Living arrangements for the past 2 months: Apartment Lives with:: Self Patient language and need for interpreter reviewed:: Yes Do you feel safe going back to the place where you live?: Yes      Need for Family Participation in Patient Care: Yes (Comment) Care giver support system in place?: Yes (comment)   Criminal Activity/Legal Involvement Pertinent to Current Situation/Hospitalization: No - Comment as needed  Activities of Daily Living Home Assistive Devices/Equipment: None ADL Screening (condition at time of admission) Patient's cognitive ability adequate to safely complete daily activities?: Yes Is the patient deaf or have difficulty hearing?: No Does the patient have difficulty seeing, even when wearing glasses/contacts?: No Does the patient have difficulty concentrating, remembering, or making decisions?: Yes Patient able to express need for assistance with ADLs?: Yes Does the patient have difficulty dressing or bathing?: No Independently performs ADLs?: Yes (appropriate for developmental age) Does the patient have difficulty walking or climbing stairs?: No Weakness of  Legs: Both Weakness of Arms/Hands: Both  Permission Sought/Granted   Permission granted to share information with : Yes, Verbal Permission Granted  Share Information with NAME: Malachy Mood (Other) [id number]  Permission granted to share info w AGENCY: SNFs        Emotional Assessment Appearance:: Appears older than stated age Attitude/Demeanor/Rapport: Engaged Affect (typically observed): Adaptable Orientation: : Oriented to Self, Oriented to Place, Oriented to  Time Alcohol / Substance Use: Alcohol Use Psych Involvement: No (comment)  Admission diagnosis:  AKI (acute kidney injury) (HCC) [N17.9] Fall, initial encounter [W19.XXXA] Closed compression fracture of L3 lumbar vertebra, initial encounter [facility]) [S32.030A] Patient Active Problem List   Diagnosis Date Noted   CKD (chronic kidney disease) stage 3, GFR 30-59 ml/min (HCC) 01/22/2023   AKI (acute kidney injury) (HCC) 01/22/2023   SVT (supraventricular tachycardia) 01/22/2023  Nondisplaced fracture of base of third metacarpal bone, right hand, initial encounter for closed fracture 01/22/2023   Spinal stenosis of lumbar region at multiple levels 01/22/2023   Hypertension 05/04/2011   History of liver transplant (HCC) 01/11/2011   ANEMIA, IRON DEFICIENCY 10/16/2010   DERMATITIS 10/16/2010   COLONIC POLYPS, HX OF 10/16/2010   THROMBOCYTOPENIA 10/14/2010   ALCOHOL ABUSE, HX OF 10/14/2010   PCP:  Oneita Hurt, No Pharmacy:   [facility] [address] [place] - [address] [place] Dike [place] [address] Phone: [phone] Fax: [phone]     Social Determinants of Health (SDOH) Social History: SDOH Screenings   Food Insecurity: No Food Insecurity (01/22/2023)  Housing: Low Risk  (01/22/2023)  Transportation Needs: No Transportation Needs (01/22/2023)  Utilities: Not At Risk (01/22/2023)  Tobacco Use: Low Risk  (01/22/2023)   SDOH Interventions:     Readmission Risk Interventions     No  data to display

## 2023-01-24 NOTE — Progress Notes (Signed)
PROGRESS NOTE  [name]  DOB: 18-Jun-1951  PCP: Aviva Kluver XBM:[id number]  DOA: 01/22/2023  LOS: 2 days  Hospital Day: 3  Brief narrative: [name] is a 72 y.o. 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  Also with history of depression, CKD, GERD, osteoporosis, basal cell cancer, severe spinal stenosis L4,5, advanced spinal stenosis L 3,4 and Cervical spinal stenosis [facility], h/o right L4-5 facet cystectomy in 2017 Patient lives at home alone.  5/18, patient was brought to the ED by EMS after he was found on the floor unable to get up. Unclear circumstances of fall.  Apparently fell 3 days ago and could not get up.  Welfare check was issued by neighbor.  EMS found him on the floor facedown, lethargic and brought to the ED.  In the ED, afebrile, heart rate 120s, blood pressure 120s, breathing on room air. On exam, patient was arousable but slow to respond, unable to recall the details of fall. He was noted to have multiple facial injuries, swelling around right orbit with difficulty opening right eye.  He overall looked dry, had skin breakdown and multiple places in his body and feet.   Initial labs with normal WBC count, normal lactic acid, normal hemoglobin with MCV 104, BUN/creatinine elevated to 64/5.65, total CK 208, urinalysis unremarkable. Blood culture sent Skeletal survey done with CT head, CT maxillofacial, CT cervical thoracic, lumbar spine, CT chest abdomen pelvis, x-rays of the knee, shoulder. Significant findings being: CT head -no acute intracranial abnormality.  Chronic microvascular disease and atrophy CT maxillofacial with soft tissue swelling around right orbit and no facial or orbital fracture CT cervical/thoracic/lumbar spine -acute superior endplate fracture at T3 with 30% height loss, chronic compression fracture of T5 T9 and  L1, multilevel spondylosis of the lumbar spine EKG with SVT at 163.   Patient was given IV hydration, broad-spectrum IV antibiotics and 1 push of IV Cardizem Admitted to [facility].  Subjective: Patient was seen and examined this morning.   Sitting up in recliner.  Not in distress.  Looks more awake than yesterday.   Labs from this morning with potassium low at 3.2, creatinine improving to 3.1.  Assessment and plan: AKI on CKD 4 Chronic metabolic acidosis Baseline creatinine 2.55 from April 2023  Presented with creatinine elevated to 5.65.  Due to dehydration for 3 days after fall.  Gradually improving creatinine with IV fluid, 3.1 today.  Continue IV fluid.  Continue monitor renal function. Continue sodium bicarb Patient was apparently started in process for renal transplant at [facility] last August but isn't on the list yet.  I really wonder given his overall poor physical condition, if he really qualifies for transplant. Recent Labs    01/22/23 1123 01/23/23 0600 01/24/23 0136  BUN 64* 52* 45*  CREATININE 5.65* 3.75* 3.10*    Chronic ongoing alcoholism H/o liver transplants twice (2004 and 2012) Does admit to drinking at least up into a day.  Also reports adherence to tacrolimus Watch out for withdrawal symptoms.  On CIWA protocol.  Normal ammonia level Continue tacrolimus Counseled to quit alcohol Continue PPI Recent Labs  Lab 01/22/23 1123 01/23/23 0600 01/24/23 0136  AST 46*  --   --   ALT 31  --   --   ALKPHOS 91  --   --   BILITOT 1.4*  --   --  PROT 7.6  --   --   ALBUMIN 3.4*  --   --   AMMONIA  --   --  19  INR 1.1  --   --   PLT 235 170 158    Acute metabolic encephalopathy Dysphagia Was somnolent yesterday.  Gradually improved mental status.  Ammonia level normal.  I would minimize use of lactulose to avoid dehydration but target 1-2 bowel movements a day Speech therapy eval obtained.  Upgraded to dysphagia 3 diet this morning.  Hypoglycemia Blood sugar level  was low at 68 yesterday.  Probably because of poor oral intake from increased somnolence and dysphagia. No history of diabetes mellitus.  IV fluid was switched to D5 NS.  I would continue the same for now. Recent Labs  Lab 01/23/23 1327 01/23/23 1410  GLUCAP 68* 73   SVT No history of arrhythmia Heart rate was elevated as high as 160s in the ED.  Given a dose of IV Cardizem Currently on home dose of Lopressor.  Heart rate stable.  Hypertension PTA list shows Lopressor 50 mg twice daily amlodipine 5 mg daily, HCTZ 12.5 mg daily, Florinef 0.1 mg daily.  Unclear if he was taking all of them.   Currently continued on Lopressor only.  Blood pressure stable.  Impaired mobility  Falls Chronic back pain Spinal stenosis of lumbar region at multiple levels Follows [facility] for severe stenosis L 45, advanced stenosis L 34.  Skeletal survey did not show any evidence of fracture  He has a back brace in place  PT/OT eval obtained.  SNF recommended. Okay to continue Roxicodone PRN, Robaxin PRN for pain control.  Right facial wound Continue local wound care with topical antibiotics  Home med list also shows Bactrim 1 tab daily MWF.  Unclear indication.  Goals of care   Code Status: Full Code.  Palliative care consulted    DVT prophylaxis:  heparin injection 5,000 Units Start: 01/22/23 1630   Antimicrobials: Stop orders for antibiotics today Fluid: Currently on half NS at 100 mill per Consultants: None Family Communication: None at bedside  Status: Inpatient Level of care:  Telemetry Medical   Patient from: Home Anticipated d/c to: Pending clinical course Needs to continue in-hospital care:  Needs IV fluid, renal function monitoring     Diet:  Diet Order             DIET DYS 3 Room service appropriate? No; Fluid consistency: Thin  Diet effective now                   Scheduled Meds:  ferrous sulfate  325 mg Oral Q breakfast   fludrocortisone  0.1  mg Oral Daily   heparin  5,000 Units Subcutaneous Q8H   l-methylfolate-B6-B12  1 tablet Oral Daily   lactulose  10 g Oral TID   metoprolol tartrate  50 mg Oral BID   multivitamin with minerals  1 tablet Oral Daily   pantoprazole  40 mg Oral Daily   polymixin-bacitracin  1 Application Topical BID   senna  1 tablet Oral BID   sodium bicarbonate  1,300 mg Oral BID   sulfamethoxazole-trimethoprim  1 tablet Oral Q M,W,F   tacrolimus  1 mg Oral BID   thiamine  100 mg Oral Daily    PRN meds: acetaminophen **OR** acetaminophen, cyclobenzaprine, HYDROmorphone (DILAUDID) injection, LORazepam, LORazepam **OR** LORazepam, magnesium oxide, ondansetron   Infusions:   dextrose 5 % and 0.9 % NaCl 75  mL/hr at 01/23/23 1430    Antimicrobials: Anti-infectives (From admission, onward)    Start     Dose/Rate Route Frequency Ordered Stop   01/24/23 1000  sulfamethoxazole-trimethoprim (BACTRIM) 400-80 MG per tablet 1 tablet       Note to Pharmacy: continuous     1 tablet Oral Every M-W-F 01/22/23 1546     01/23/23 1300  ceFEPIme (MAXIPIME) 1 g in sodium chloride 0.9 % 100 mL IVPB  Status:  Discontinued        1 g 200 mL/hr over 30 Minutes Intravenous Every 24 hours 01/22/23 1236 01/23/23 0949   01/22/23 1235  vancomycin variable dose per unstable renal function (pharmacist dosing)  Status:  Discontinued         Does not apply See admin instructions 01/22/23 1236 01/23/23 0949   01/22/23 1215  vancomycin (VANCOREADY) IVPB 1500 mg/300 mL        1,500 mg 150 mL/hr over 120 Minutes Intravenous  Once 01/22/23 1209 01/22/23 1558   01/22/23 1215  ceFEPIme (MAXIPIME) 2 g in sodium chloride 0.9 % 100 mL IVPB        2 g 200 mL/hr over 30 Minutes Intravenous  Once 01/22/23 1209 01/22/23 1404       Nutritional status:  Body mass index is 20.95 kg/m.          Objective: Vitals:   01/24/23 0529 01/24/23 0800  BP: 137/85 (!) 133/54  Pulse: 83 96  Resp:  18  Temp: 98.6 F (37 C) 97.8 F (36.6  C)  SpO2: 91% 94%    Intake/Output Summary (Last 24 hours) at 01/24/2023 1150 Last data filed at 01/24/2023 1100 Gross per 24 hour  Intake 919.7 ml  Output 2650 ml  Net -1730.3 ml    Filed Weights   01/22/23 1104 01/22/23 1756  Weight: 72.6 kg 62.5 kg   Weight change:  Body mass index is 20.95 kg/m.   Physical Exam: General exam: Pleasant, elderly Caucasian male.  Chronic sick looking. Skin: Multiple rashes and abrasions in the body. HEENT: Atraumatic, normocephalic, no obvious bleeding.  Right facial area with wound as in the picture Lungs: Clear to auscultation bilaterally CVS: Regular rate and rhythm, no murmur GI/Abd soft, nontender, nondistended, bowel sound present CNS: Alert, awake, able to answer questions.   Psychiatry: Sad affect Extremities: No edema, no calf tenderness  Data Review: I have personally reviewed the laboratory data and studies available.  F/u labs ordered Unresulted Labs (From admission, onward)     Start     Ordered   01/24/23 0500  CBC with Differential/Platelet  Daily,   R      01/23/23 0954   01/24/23 0500  Basic metabolic panel  Daily,   R      01/23/23 0954            Total time spent in review of labs and imaging, patient evaluation, formulation of plan, documentation and communication with family: 45 minutes  Signed, Lorin Glass, MD [facility] 01/24/2023

## 2023-01-24 NOTE — Progress Notes (Signed)
Speech Language Pathology Treatment: Dysphagia  Patient Details Name: [name] MRN: [id number] DOB: 15-Mar-1951 Today's Date: 01/24/2023 Time: 4098-1191 SLP Time Calculation (min) (ACUTE ONLY): 11 min  Assessment / Plan / Recommendation Clinical Impression  Pt with improvements in swallow function today and receptive to po's. Pt aware of swallowing difficulty yesterday and stated he felt like he was drinking fast. He consumed applesauce, straw sips thin and solid texture. He is missing upper dentition and cannot find his upper plate but states he can eat without it. He was able to masticate graham cracker timely and cleared oral cavity. There was one delayed cough noted throughout session but otherwise his swallow appeared safe and efficient. Recommend he consume thin liquids, Dys 3 diet, sit upright, pills with thin (discussed with RN, if difficulty can place pill whole in applesauce. ST will follow up once more.    HPI HPI: [name] is a pleasant 72 year old male with medical history significant for 2 liver transplants the first in 2004 and a repeat in 2012, GERD, hypertension, history of alcohol abuse, osteoporosis, history of basal cell carcinoma, and history of a right L4-5 facet cystectomy in 2017, CKD III, depression. By report he fell at home 3 days PTA and was immobilized. EMS activated and patient transported to [facility] for evaluation. By record review he does have severe spinal stenosis L4,5, advanced spinal stenosis L 3,4 and Cervical spinal stenosis followed [facility]. Found with AKI, AMS, severe coughing with liquid intake.      SLP Plan  Continue with current plan of care      Recommendations for follow up therapy are one component of a multi-disciplinary discharge planning process, led by the attending physician.  Recommendations may be updated based on patient status, additional functional criteria and insurance authorization.    Recommendations  Diet  recommendations: Dysphagia 3 (mechanical soft);Thin liquid Liquids provided via: Cup;Straw Medication Administration: Whole meds with liquid (if cough whole in puree) Supervision: Patient able to self feed Compensations: Slow rate;Small sips/bites Postural Changes and/or Swallow Maneuvers: Seated upright 90 degrees                  Oral care BID   Intermittent Supervision/Assistance Dysphagia, oropharyngeal phase (R13.12)     Continue with current plan of care     Royce Macadamia  01/24/2023, 8:48 AM

## 2023-01-25 DIAGNOSIS — N1831 Chronic kidney disease, stage 3a: Secondary | ICD-10-CM

## 2023-01-25 DIAGNOSIS — Z944 Liver transplant status: Secondary | ICD-10-CM

## 2023-01-25 DIAGNOSIS — Z515 Encounter for palliative care: Secondary | ICD-10-CM

## 2023-01-25 DIAGNOSIS — Z7189 Other specified counseling: Secondary | ICD-10-CM | POA: Diagnosis not present

## 2023-01-25 DIAGNOSIS — N179 Acute kidney failure, unspecified: Secondary | ICD-10-CM | POA: Diagnosis not present

## 2023-01-25 LAB — CBC WITH DIFFERENTIAL/PLATELET
Abs Immature Granulocytes: 0.03 10*3/uL (ref 0.00–0.07)
Basophils Absolute: 0 10*3/uL (ref 0.0–0.1)
Basophils Relative: 0 %
Eosinophils Absolute: 0.3 10*3/uL (ref 0.0–0.5)
Eosinophils Relative: 6 %
HCT: 30 % — ABNORMAL LOW (ref 39.0–52.0)
Hemoglobin: 9.8 g/dL — ABNORMAL LOW (ref 13.0–17.0)
Immature Granulocytes: 1 %
Lymphocytes Relative: 16 %
Lymphs Abs: 0.7 10*3/uL (ref 0.7–4.0)
MCH: 33.2 pg (ref 26.0–34.0)
MCHC: 32.7 g/dL (ref 30.0–36.0)
MCV: 101.7 fL — ABNORMAL HIGH (ref 80.0–100.0)
Monocytes Absolute: 0.4 10*3/uL (ref 0.1–1.0)
Monocytes Relative: 10 %
Neutro Abs: 3 10*3/uL (ref 1.7–7.7)
Neutrophils Relative %: 67 %
Platelets: 161 10*3/uL (ref 150–400)
RBC: 2.95 MIL/uL — ABNORMAL LOW (ref 4.22–5.81)
RDW: 13.2 % (ref 11.5–15.5)
WBC: 4.4 10*3/uL (ref 4.0–10.5)
nRBC: 0 % (ref 0.0–0.2)

## 2023-01-25 LAB — BASIC METABOLIC PANEL
Anion gap: 7 (ref 5–15)
BUN: 30 mg/dL — ABNORMAL HIGH (ref 8–23)
CO2: 23 mmol/L (ref 22–32)
Calcium: 7.8 mg/dL — ABNORMAL LOW (ref 8.9–10.3)
Chloride: 107 mmol/L (ref 98–111)
Creatinine, Ser: 2.21 mg/dL — ABNORMAL HIGH (ref 0.61–1.24)
GFR, Estimated: 31 mL/min — ABNORMAL LOW (ref >60)
Glucose, Bld: 92 mg/dL (ref 70–99)
Potassium: 2.6 mmol/L — CL (ref 3.5–5.1)
Sodium: 137 mmol/L (ref 135–145)

## 2023-01-25 LAB — RETICULOCYTES
Immature Retic Fract: 11.7 % (ref 2.3–15.9)
RBC.: 2.93 MIL/uL — ABNORMAL LOW (ref 4.22–5.81)
Retic Count, Absolute: 31.1 10*3/uL (ref 19.0–186.0)
Retic Ct Pct: 1.1 % (ref 0.4–3.1)

## 2023-01-25 LAB — FOLATE: Folate: 11.2 ng/mL (ref >5.9)

## 2023-01-25 LAB — CULTURE, BLOOD (ROUTINE X 2): Culture: NO GROWTH

## 2023-01-25 LAB — FERRITIN: Ferritin: 1586 ng/mL — ABNORMAL HIGH (ref 24–336)

## 2023-01-25 LAB — VITAMIN B12: Vitamin B-12: 1707 pg/mL — ABNORMAL HIGH (ref 180–914)

## 2023-01-25 LAB — IRON AND TIBC
Iron: 13 ug/dL — ABNORMAL LOW (ref 45–182)
Saturation Ratios: 10 % — ABNORMAL LOW (ref 17.9–39.5)
TIBC: 133 ug/dL — ABNORMAL LOW (ref 250–450)
UIBC: 120 ug/dL

## 2023-01-25 LAB — MAGNESIUM: Magnesium: 1.2 mg/dL — ABNORMAL LOW (ref 1.7–2.4)

## 2023-01-25 MED ORDER — TRAZODONE HCL 50 MG PO TABS: 50.0000 mg | ORAL_TABLET | Freq: Every evening | ORAL | Status: DC | PRN

## 2023-01-25 MED ORDER — DEXTROSE-NACL 5-0.9 % IV SOLN: INTRAVENOUS | Status: DC

## 2023-01-25 MED ORDER — BUPROPION HCL ER (SR) 150 MG PO TB12
150.0000 mg | ORAL_TABLET | Freq: Every day | ORAL | Status: DC
  Administered 2023-01-25 – 2023-01-27 (×3): 150 mg via ORAL
  Filled 2023-01-25 (×3): qty 1

## 2023-01-25 MED ORDER — TAMSULOSIN HCL 0.4 MG PO CAPS
0.4000 mg | ORAL_CAPSULE | Freq: Every day | ORAL | Status: DC
  Administered 2023-01-25 – 2023-01-27 (×3): 0.4 mg via ORAL
  Filled 2023-01-25 (×3): qty 1

## 2023-01-25 MED ORDER — SODIUM BICARBONATE 650 MG PO TABS
1300.0000 mg | ORAL_TABLET | Freq: Two times a day (BID) | ORAL | Status: DC
  Administered 2023-01-25 – 2023-01-27 (×5): 1300 mg via ORAL
  Filled 2023-01-25 (×5): qty 2

## 2023-01-25 MED ORDER — MAGNESIUM SULFATE 2 GM/50ML IV SOLN
2.0000 g | Freq: Once | INTRAVENOUS | Status: AC
  Administered 2023-01-25: 2 g via INTRAVENOUS
  Filled 2023-01-25: qty 50

## 2023-01-25 MED ORDER — POTASSIUM CHLORIDE CRYS ER 20 MEQ PO TBCR
40.0000 meq | EXTENDED_RELEASE_TABLET | ORAL | Status: AC
  Administered 2023-01-25 (×2): 40 meq via ORAL
  Filled 2023-01-25 (×2): qty 2

## 2023-01-25 NOTE — TOC Progression Note (Signed)
Transition of Care [facility]) - Initial/Assessment Note    Patient Details  Name: [name] MRN: [id number] Date of Birth: [birth year]  Transition of Care [facility]) CM/SW Contact:    Ralene Bathe, LCSWA Phone Number: 01/25/2023, 3:53 PM  Clinical Narrative:                 Patient does not have any bed offers at this time.  LCSW contacted the patient's insurance company to inquire about the patient's out of network benefits.  The patient does not have out of network benefits and LCSW is unable find a SNF in [place] that is in network with the insurance.    TOC following.   Expected Discharge Plan: Skilled Nursing Facility Barriers to Discharge: English as a second language teacher, Continued Medical Work up, SNF Pending bed offer   Patient Goals and CMS Choice   CMS Medicare.gov Compare Post Acute Care list provided to:: Patient Choice offered to / list presented to : Patient      Expected Discharge Plan and Services In-house Referral: Clinical Social Work     Living arrangements for the past 2 months: Apartment                                      Prior Living Arrangements/Services Living arrangements for the past 2 months: Apartment Lives with:: Self Patient language and need for interpreter reviewed:: Yes Do you feel safe going back to the place where you live?: Yes      Need for Family Participation in Patient Care: Yes (Comment) Care giver support system in place?: Yes (comment)   Criminal Activity/Legal Involvement Pertinent to Current Situation/Hospitalization: No - Comment as needed  Activities of Daily Living Home Assistive Devices/Equipment: None ADL Screening (condition at time of admission) Patient's cognitive ability adequate to safely complete daily activities?: Yes Is the patient deaf or have difficulty hearing?: No Does the patient have difficulty seeing, even when wearing glasses/contacts?: No Does the patient have difficulty concentrating,  remembering, or making decisions?: Yes Patient able to express need for assistance with ADLs?: Yes Does the patient have difficulty dressing or bathing?: No Independently performs ADLs?: Yes (appropriate for developmental age) Does the patient have difficulty walking or climbing stairs?: No Weakness of Legs: Both Weakness of Arms/Hands: Both  Permission Sought/Granted   Permission granted to share information with : Yes, Verbal Permission Granted  Share Information with NAME: Malachy Mood (Other) [phone]  Permission granted to share info w AGENCY: SNFs        Emotional Assessment Appearance:: Appears older than stated age Attitude/Demeanor/Rapport: Engaged Affect (typically observed): Adaptable Orientation: : Oriented to Self, Oriented to Place, Oriented to  Time Alcohol / Substance Use: Alcohol Use Psych Involvement: No (comment)  Admission diagnosis:  AKI (acute kidney injury) (HCC) [N17.9] Fall, initial encounter [W19.XXXA] Closed compression fracture of L3 lumbar vertebra, initial encounter (HCC) [S32.030A] Patient Active Problem List   Diagnosis Date Noted   CKD (chronic kidney disease) stage 3, GFR 30-59 ml/min (HCC) 01/22/2023   AKI (acute kidney injury) (HCC) 01/22/2023   SVT (supraventricular tachycardia) 01/22/2023   Nondisplaced fracture of base of third metacarpal bone, right hand, initial encounter for closed fracture 01/22/2023   Spinal stenosis of lumbar region at multiple levels 01/22/2023   Hypertension 05/04/2011   History of liver transplant (HCC) 01/11/2011   ANEMIA, IRON DEFICIENCY 10/16/2010   DERMATITIS 10/16/2010   COLONIC POLYPS, HX OF 10/16/2010  THROMBOCYTOPENIA 10/14/2010   ALCOHOL ABUSE, HX OF 10/14/2010   PCP:  Oneita Hurt, No Pharmacy:   [facility] [address] [place] - [name] [place] [address] Phone: [phone] Fax: [phone]     Social Determinants of Health (SDOH) Social  History: SDOH Screenings   Food Insecurity: No Food Insecurity (01/22/2023)  Housing: Low Risk  (01/22/2023)  Transportation Needs: No Transportation Needs (01/22/2023)  Utilities: Not At Risk (01/22/2023)  Tobacco Use: Low Risk  (01/22/2023)   SDOH Interventions:     Readmission Risk Interventions     No data to display

## 2023-01-25 NOTE — Consult Note (Signed)
Consultation Note Date: 01/25/2023   Patient Name: [name]  DOB: 1950-09-30  MRN: [id number]  Age / Sex: 72 y.o., male  PCP: Pcp, No Referring Physician: Lorin Glass, MD  Reason for Consultation: Establishing goals of care and Psychosocial/spiritual support  HPI/Patient Profile: 72 y.o. 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 By report he fell at home 3 days PTA and was immobilized. EMS activated and [facility] transported [facility] for evaluation. By record review he does have severe spinal stenosis L4,5, advanced spinal stenosis L 3,4 and Cervical spinal stenosis followed at [facility].    ED Course: T 99.3  133/83  HR 114 (has bee nup to 130) RR 25. General appearance dissheveled. ZLab: BUN/Cr 64/5.65, CK total 208, Lactic acid 1.9  CBCD nl, INR 1.1, U/A negative UDS negative. CTchest/abd/pelvis - compression fx T5T9L1 probably old, CT C-spine negative for fx. CT head - atrophy noted, no acute findings, no facial bone fx's. X-ray righ hand - nond-displaced fx based 3rd metatarsal. EKG with SVT at 163.     Patient  faces treatment option decisions, advanced directive decisions and anticipatory care needs.   Clinical Assessment and Goals of Care:   This NP Lorinda Creed reviewed medical records, received report from team, assessed the patient and then meet at the patient's bedside  to discuss diagnosis, prognosis, GOC, EOL wishes disposition and options.   Concept of Palliative Care was introduced as specialized medical care for people and their families living with serious illness.  If focuses on providing relief from the symptoms and stress  of a serious illness.  The goal is to improve quality of life for both the patient and the family. Values and goals of care important to patient and family were attempted to be elicited.  Created space and opportunity for patient to explore thoughts and feelings regarding current medical situation.   Patient verbalizes his healthcare story secondary to his liver disease and transplants x 2.  He tells me that he [place] all of his care in [place] at the [facility] health system.  He does not have a primary care provider here in [place] and I believe his insurance continues to be out of [place].  He tells me he simply goes back and forth when he needs medical attention.  On further inquiry he does report relapsing and using EtOH secondary to "boredom".  He reports that he has significant physical and functional decline over the past 6 months.    We explored self advocacy in one's  healthcare treatment plan.   A  discussion was had today regarding advanced directives.  Concepts specific to code status, artifical feeding and hydration, continued IV antibiotics and rehospitalization was had.  The difference between a aggressive medical intervention path  and  a palliative comfort care path for this patient at this time was had.    MOST form introduced   Questions and concerns addressed.  Patient  encouraged to call with questions or concerns.     PMT will continue to support holistically.       Patient gave me the contact for his sister Candice Misner   # [phone] I have left a voicemail and await callback.     Patient does not have any advance care planning documents or living will or H POA.  He tells me in the event that he could not speak for himself he wishes for his stated girlfriend Malachy Mood to be his voice.  I was unable to leave a message with Donnald Garre today.  I understand that she herself is hospitalized.      SUMMARY OF RECOMMENDATIONS   Code Status/Advance Care  Planning: Full code Educated patient/family to consider DNR/DNI status understanding evidenced based poor outcomes in similar hospitalized patient, as the cause of arrest is likely associated with advanced chronic illness rather than an easily reversible acute cardio-pulmonary event.    Palliative Prophylaxis:  Bowel Regimen, Delirium Protocol, Frequent Pain Assessment, and Oral Care  Additional Recommendations (Limitations, Scope, Preferences): Full Scope Treatment  Psycho-social/Spiritual:  Desire for further Chaplaincy support:no   Prognosis:  Unable to determine  Discharge Planning: To Be Determined      Primary Diagnoses: Present on Admission:  Hypertension  SVT (supraventricular tachycardia)  Nondisplaced fracture of base of third metacarpal bone, right hand, initial encounter for closed fracture  Spinal stenosis of lumbar region at multiple levels   I have reviewed the medical record, interviewed the patient and family, and examined the patient. The following aspects are pertinent.  Past Medical History:  Diagnosis Date   ALCOHOL ABUSE, HX OF    ANEMIA, IRON DEFICIENCY    DERMATITIS    Hepatic encephalopathy (HCC)    HEPATIC FAILURE, END STAGE 2004   liver transplant 2004; 2nd orthotopic liver transplant 01/25/11 [facility] [phone] dr. Fayrene Fearing dewar   Hypertension    THROMBOCYTOPENIA    Social History   Socioeconomic History   Marital status: Single    Spouse name: Not on file   Number of children: Not on file   Years of education: Not on file   Highest education level: Not on file  Occupational History   Not on file  Tobacco Use   Smoking status: Never   Smokeless tobacco: Never   Tobacco comments:    Remotely remarried-divorced in 1979 after 3 kids. Single, lives with g-friend [name]. Disabled  Substance and Sexual Activity   Alcohol use: No    Comment: No alcohol since 2003   Drug use: No   Sexual activity: Not on file  Other Topics Concern   Not  on file  Social History Narrative   Not on file   Social Determinants of Health   Financial Resource Strain: Not on file  Food Insecurity: No Food Insecurity (01/22/2023)   Hunger Vital Sign    Worried About Running Out of Food in the Last Year: Never true    Ran Out of Food in the Last Year: Never true  Transportation Needs: No Transportation Needs (01/22/2023)   PRAPARE - Administrator, Civil Service (Medical): No    Lack of Transportation (Non-Medical): No  Physical Activity: Not on file  Stress: Not on file  Social Connections: Not on file   Family History  Problem Relation Age  of Onset   Arthritis Mother    Hypertension Mother    Lung cancer Father    Heart disease Father    Cancer Father        lung cancer   Hypertension Sister    Scheduled Meds:  buPROPion  150 mg Oral Daily   Chlorhexidine Gluconate Cloth  6 each Topical Daily   ferrous sulfate  325 mg Oral Q breakfast   fludrocortisone  0.1 mg Oral Daily   heparin  5,000 Units Subcutaneous Q8H   l-methylfolate-B6-B12  1 tablet Oral Daily   lactulose  10 g Oral Daily   metoprolol tartrate  50 mg Oral BID   multivitamin with minerals  1 tablet Oral Daily   pantoprazole  40 mg Oral Daily   polymixin-bacitracin  1 Application Topical BID   potassium chloride  40 mEq Oral Q2H   senna  1 tablet Oral BID   sodium bicarbonate  1,300 mg Oral BID   sulfamethoxazole-trimethoprim  1 tablet Oral Q M,W,F   tacrolimus  1 mg Oral BID   tamsulosin  0.4 mg Oral Daily   thiamine  100 mg Oral Daily   Continuous Infusions: PRN Meds:.acetaminophen **OR** acetaminophen, cyclobenzaprine, HYDROmorphone (DILAUDID) injection, LORazepam, LORazepam **OR** LORazepam, magnesium oxide, ondansetron, traZODone Medications Prior to Admission:  Prior to Admission medications   Medication Sig Start Date End Date Taking? Authorizing Provider  amLODipine (NORVASC) 5 MG tablet TAKE ONE TABLET BY MOUTH EVERY DAY Patient taking  differently: Take 5 mg by mouth daily. 04/01/12  Yes Newt Lukes, MD  Ascorbic Acid (VITAMIN C) 500 MG tablet Take 500 mg by mouth daily.     Yes [provider]  buPROPion (WELLBUTRIN SR) 150 MG 12 hr tablet Take 150 mg by mouth 2 (two) times daily.   Yes [provider]  Calcium-Magnesium-Vitamin D (CITRACAL CALCIUM+D PO) Take 1 tablet by mouth daily.   Yes [provider]  famotidine (PEPCID) 40 MG tablet Take 40 mg by mouth at bedtime.   Yes [provider]  Magnesium Oxide 400 MG CAPS Take 400 mg by mouth daily as needed (cramps).   Yes [provider]  metoprolol (LOPRESSOR) 50 MG tablet Take 1 tablet (50 mg total) by mouth 2 (two) times daily. 05/04/11  Yes Newt Lukes, MD  Multiple Vitamin (MULTIVITAMIN) tablet Take 1 tablet by mouth daily with breakfast.   Yes [provider]  sodium bicarbonate 650 MG tablet Take 1,300 mg by mouth 2 (two) times daily.    Yes [provider]  tacrolimus (PROGRAF) 1 MG capsule Take 1 mg by mouth in the morning and at bedtime.   Yes [provider]  tamsulosin (FLOMAX) 0.4 MG CAPS capsule Take 0.4 mg by mouth daily.   Yes [provider]  traZODone (DESYREL) 100 MG tablet Take 100 mg by mouth at bedtime as needed for sleep.   Yes [provider]   Allergies  Allergen Reactions   Tape Other (See Comments)    SKIN TEARS VERY EASILY!!!!! Can tolerate ONLY paper tape.   Zolpidem Other (See Comments)    Confusion   Moxifloxacin Rash and Other (See Comments)    Achilles tendon pain also   Review of Systems  Neurological:  Positive for weakness.    Physical Exam Constitutional:      Appearance: He is underweight. He is ill-appearing.  HENT:     Head:     Comments: Noted facial abrasions Cardiovascular:  Rate and Rhythm: Normal rate.  Pulmonary:     Effort: Pulmonary effort is normal.  Skin:    General: Skin is warm and dry.  Neurological:      Mental Status: He is lethargic.    Vital Signs: BP (!) 138/91 (BP Location: Left Wrist)   Pulse 94   Temp 98.8 F (37.1 C) (Oral)   Resp 20   Ht 5\' 8"  (1.727 m)   Wt 62.5 kg   SpO2 95%   BMI 20.95 kg/m  Pain Scale: 0-10   Pain Score: 0-No pain   SpO2: SpO2: 95 % O2 Device:SpO2: 95 % O2 Flow Rate: .O2 Flow Rate (L/min): 2 L/min  IO: Intake/output summary:  Intake/Output Summary (Last 24 hours) at 01/25/2023 1051 Last data filed at 01/25/2023 1048 Gross per 24 hour  Intake 3277.28 ml  Output 2900 ml  Net 377.28 ml    LBM: Last BM Date : 01/24/23 Baseline Weight: Weight: 72.6 kg Most recent weight: Weight: 62.5 kg     Palliative Assessment/Data:       Time: 75 minutes  Signed by: Lorinda Creed, NP   Please contact Palliative Medicine Team phone at [phone] for questions and concerns.  For individual provider: See Loretha Stapler

## 2023-01-25 NOTE — Progress Notes (Signed)
Physical Therapy Treatment Patient Details Name: [name] MRN: [id number] DOB: 03-28-1951 Today's Date: 01/25/2023   History of Present Illness Pt is a 72 y/o M presenting to ED on 5/18 after fall at home, was immobilized for 3 days, admitted for AKI. Workup revealing severe spinal stenosis L4-5, and advanced spinal stenosis L3-4, nondispalced fx of R 3rd metacarpal. PMH includes 2 liver transplants, GERD, HTN, alcohol abuse, osteoporosis, basal cell carcinoma.    PT Comments    Pt greeted resting in bed and agreeable to session with good progress towards acute goals. Pt needing significantly less assist for all mobility this session versus evaluation, requiring min A to complete bed mobility and transfer to stand with RW support. Pt able to progress gait for 100' in hall with RW support and up to min A to steady with no overt LOB noted. Pt needing cues for safety awareness as pt leaving RW behind and reaching for furniture once back in room. Educated pt on importance of continued mobility and time up OOB with pt verbalizing understanding. Current plan remains appropriate to address deficits and maximize functional independence and decrease caregiver burden, however pt may progress to home without need for PT follow-up, will continue to follow acutely.    Recommendations for follow up therapy are one component of a multi-disciplinary discharge planning process, led by the attending physician.  Recommendations may be updated based on patient status, additional functional criteria and insurance authorization.  Follow Up Recommendations  Can patient physically be transported by private vehicle: No    Assistance Recommended at Discharge Frequent or constant Supervision/Assistance  Patient can return home with the following     Equipment Recommendations  Rolling walker (2 wheels);BSC/3in1    Recommendations for Other Services       Precautions / Restrictions Precautions Precautions:  Fall;Back Precaution Comments: maintained back precautions during session Required Braces or Orthoses: Spinal Brace Spinal Brace: Thoracolumbosacral orthotic Restrictions Weight Bearing Restrictions: No     Mobility  Bed Mobility Overal bed mobility: Needs Assistance Bed Mobility: Sidelying to Sit, Rolling Rolling: Supervision Sidelying to sit: Min assist       General bed mobility comments: light min A to manage LEs initially    Transfers Overall transfer level: Needs assistance Equipment used: Rolling walker (2 wheels) Transfers: Sit to/from Stand, Bed to chair/wheelchair/BSC Sit to Stand: Min assist, Min guard   Step pivot transfers: Min guard       General transfer comment: min A to rise on initial stand to pwoer up, min guard for subsequent stands, min guard to step pivot EOB>chair without AD    Ambulation/Gait Ambulation/Gait assistance: Min assist, Min guard Gait Distance (Feet): 100 Feet Assistive device: Rolling walker (2 wheels) Gait Pattern/deviations: Step-through pattern, Decreased stride length, Trunk flexed, Narrow base of support Gait velocity: decr     General Gait Details: slow but faily steady gait with RW support, cues for wider BOS with pt able to correct, noted trunk flexion with pt unable to correct due to chronic back pain   Stairs             Wheelchair Mobility    Modified Rankin (Stroke Patients Only)       Balance Overall balance assessment: Needs assistance Sitting-balance support: Feet supported Sitting balance-Leahy Scale: Fair     Standing balance support: During functional activity, Reliant on assistive device for balance Standing balance-Leahy Scale: Poor Standing balance comment: able to maintain static standing without UE support, BUE during dynamic tasks  Cognition Arousal/Alertness: Awake/alert Behavior During Therapy: WFL for tasks assessed/performed Overall Cognitive  Status: Within Functional Limits for tasks assessed                                 General Comments: some short term memory deficits, did not remember PT session from Sunday, but otherwise A&Ox4        Exercises      General Comments General comments (skin integrity, edema, etc.): VSS on RA      Pertinent Vitals/Pain Pain Assessment Pain Assessment: No/denies pain    Home Living                          Prior Function            PT Goals (current goals can now be found in the care plan section) Acute Rehab PT Goals PT Goal Formulation: Patient unable to participate in goal setting Time For Goal Achievement: 02/06/23 Progress towards PT goals: Progressing toward goals    Frequency    Min 2X/week      PT Plan      Co-evaluation              AM-PAC PT "6 Clicks" Mobility   Outcome Measure  Help needed turning from your back to your side while in a flat bed without using bedrails?: A Little Help needed moving from lying on your back to sitting on the side of a flat bed without using bedrails?: A Little Help needed moving to and from a bed to a chair (including a wheelchair)?: A Little Help needed standing up from a chair using your arms (e.g., wheelchair or bedside chair)?: A Little Help needed to walk in hospital room?: A Little Help needed climbing 3-5 steps with a railing? : Total 6 Click Score: 16    End of Session Equipment Utilized During Treatment: Gait belt;Back brace Activity Tolerance: Patient tolerated treatment well Patient left: with call bell/phone within reach;in chair;with chair alarm set Nurse Communication: Mobility status PT Visit Diagnosis: Unsteadiness on feet (R26.81);Other abnormalities of gait and mobility (R26.89);History of falling (Z91.81);Muscle weakness (generalized) (M62.81);Other symptoms and signs involving the nervous system (J19.147)     Time: 8295-6213 PT Time Calculation (min) (ACUTE ONLY):  25 min  Charges:  $Gait Training: 8-22 mins $Therapeutic Activity: 8-22 mins                     Diogenes Whirley R. PTA Acute Rehabilitation Services Office: [phone]   Catalina Antigua 01/25/2023, 2:58 PM

## 2023-01-25 NOTE — Progress Notes (Signed)
Speech Language Pathology Treatment: Dysphagia  Patient Details Name: [name] MRN: [id number] DOB: June 16, 1951 Today's Date: 01/25/2023 Time: 4098-1191 SLP Time Calculation (min) (ACUTE ONLY): 9 min  Assessment / Plan / Recommendation Clinical Impression  Pt seen after initiating po's yesterday. He is alert, cognitively appropriate and receptive to liquids and solids. He denied difficulty with breakfast this morning. Swallow and respirations appeared coordinated with consecutive straw sips thin. Observed pt taking Potassium pill with RN without difficulty. He states he typically takes multiple pills at once and was advised to take 1-2 at a time for now until he feels he is back to baseline. No difficulty masticating graham cracker despite lack of upper dentition. Pt agreeable to remain on Dys 3 (chopped meats) to facilitate oral phase. Continue thin liquids, sitting upright. No further ST needed at this time.    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 By report he fell at home 3 days PTA and was immobilized. EMS activated and patient transported to [facility] for evaluation. By record review he does have severe spinal stenosis L4,5, advanced spinal stenosis L 3,4 and Cervical spinal stenosis followed at [facility]. Found with AKI, AMS, severe coughing with liquid intake.      SLP Plan  All goals met;Discharge SLP treatment due to (comment)      Recommendations for follow up therapy are one component of a multi-disciplinary discharge planning process, led by the attending physician.  Recommendations may be updated based on patient status, additional functional criteria and insurance authorization.     Recommendations  Diet recommendations: Dysphagia 3 (mechanical soft);Thin liquid Liquids provided via: Cup;Straw Medication Administration: Whole meds with liquid Supervision: Patient able to self feed Compensations: Slow rate;Small sips/bites Postural Changes and/or Swallow Maneuvers: Seated upright 90 degrees                  Oral care BID   None Dysphagia, oropharyngeal phase (R13.12)     All goals met;Discharge SLP treatment due to (comment)     Royce Macadamia  01/25/2023, 9:01 AM

## 2023-01-25 NOTE — Progress Notes (Signed)
PROGRESS NOTE  [name]  DOB: May 12, 1951  PCP: [name] ZOX:[id number]  DOA: 01/22/2023  LOS: 3 days  Hospital Day: 4  Brief narrative: [name] is a 73 y.o. 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  Also with history of depression, CKD, GERD, osteoporosis, basal cell cancer, severe spinal stenosis L4,5, advanced spinal stenosis L 3,4 and Cervical spinal stenosis followed at [name], h/o right L4-5 facet cystectomy in 2017 Patient lives at home alone.  5/18, patient was brought to the ED by EMS after he was found on the floor unable to get up. Unclear circumstances of fall.  Apparently fell 3 days ago and could not get up.  Welfare check was issued by neighbor.  EMS found him on the floor facedown, lethargic and brought to the ED.  In the ED, afebrile, heart rate 120s, blood pressure 120s, breathing on room air. On exam, patient was arousable but slow to respond, unable to recall the details of fall. He was noted to have multiple facial injuries, swelling around right orbit with difficulty opening right eye.  He overall looked dry, had skin breakdown and multiple places in his body and feet.   Initial labs with normal WBC count, normal lactic acid, normal hemoglobin with MCV 104, BUN/creatinine elevated to 64/5.65, total CK 208, urinalysis unremarkable. Blood culture sent Skeletal survey done with CT head, CT maxillofacial, CT cervical thoracic, lumbar spine, CT chest abdomen pelvis, x-rays of the knee, shoulder. Significant findings being: CT head -no acute intracranial abnormality.  Chronic microvascular disease and atrophy CT maxillofacial with soft tissue swelling around right orbit and no facial or orbital fracture CT cervical/thoracic/lumbar spine -acute superior endplate fracture at T3 with 30% height loss, chronic compression fracture of T5 T9 and  L1, multilevel spondylosis of the lumbar spine EKG with SVT at 163.   Patient was given IV hydration, broad-spectrum IV antibiotics and 1 push of IV Cardizem Admitted to [name].  Subjective: Patient was seen and examined this morning.  Sitting up in bed.  Not in distress no new symptoms. Foley catheter pulled out this morning.  Has not voided yet. Labs from this morning with potassium 2.6, creatinine down to 2.21, hemoglobin 9.8.  Assessment and plan: AKI on CKD 4 Chronic metabolic acidosis Baseline creatinine 2.55 from April 2023  Presented with creatinine elevated to 5.65.  Due to dehydration for 3 days after fall.  Gradually improving creatinine with IV fluid, 2.21 today which is within his normal range.  Stop IV fluid.  Encourage oral hydration. Chronically on sodium bicarb 1300 mg twice daily.  Continue the same. Patient was apparently started in process for renal transplant at [name] last August but isn't on the list yet.  I really wonder given his overall poor physical condition, if he really qualifies for transplant. Recent Labs    01/22/23 1123 01/23/23 0600 01/24/23 0136 01/25/23 0445  BUN 64* 52* 45* 30*  CREATININE 5.65* 3.75* 3.10* 2.21*   Severe hypokalemia/hypomagnesemia Potassium low at 2.6 this morning.  Replacement ordered. Magnesium level low at 1.2.  IV magnesium repletion ordered.  Resume oral magnesium from home. Recent Labs  Lab 01/22/23 1123 01/23/23 0600 01/24/23 0136 01/25/23 0445  K 4.8 3.9 3.2* 2.6*  MG  --   --   --  1.2*    Chronic ongoing alcoholism H/o liver transplants twice (2004 and 2012) Does admit  to drinking at least up into a day.  Also reports adherence to tacrolimus Watch out for withdrawal symptoms.  On CIWA protocol.  Normal ammonia level Continue tacrolimus Counseled to quit alcohol Continue PPI Recent Labs  Lab 01/22/23 1123 01/23/23 0600 01/24/23 0136 01/25/23 0445  AST 46*  --   --   --   ALT 31  --   --   --   ALKPHOS  91  --   --   --   BILITOT 1.4*  --   --   --   PROT 7.6  --   --   --   ALBUMIN 3.4*  --   --   --   AMMONIA  --   --  19  --   INR 1.1  --   --   --   PLT 235 170 158 161   Chronic macrocytic anemia Macrocytosis due to alcohol use.  Initial hemoglobin was in normal range due to hemoconcentration.  No active bleeding but hemoglobin level dropping down probably towards his normal. Obtain anemia panel Recent Labs    01/22/23 1123 01/23/23 0600 01/24/23 0136 01/25/23 0445  HGB 14.2 12.4* 11.1* 9.8*  MCV 103.7* 103.2* 102.4* 101.7*  FOLATE  --   --   --  11.2  RETICCTPCT  --   --   --  1.1   Acute metabolic encephalopathy Dysphagia Summary improved. Ammonia level normal.  I would minimize use of lactulose to avoid dehydration but target 1-2 bowel movements a day Speech therapy eval obtained.  Upgraded to dysphagia 3 diet on 5/20.  Hypoglycemia Blood sugar level was low at 68 on 5/19.  Probably because of poor oral intake from increased somnolence and dysphagia. No history of diabetes mellitus.  IV fluid was switched to D5 NS.  Oral diet has improved.  Stop dextrose drip today. Recent Labs  Lab 01/23/23 1327 01/23/23 1410  GLUCAP 68* 73   Hypertension PTA list shows Lopressor 50 mg twice daily, amlodipine 5 mg daily. Currently blood pressure and heart rate are controlled on Lopressor only.  Impaired mobility  Falls Chronic back pain Spinal stenosis of lumbar region at multiple levels [name] for severe stenosis L 45, advanced stenosis L 34.  Skeletal survey did not show any evidence of fracture  He has a back brace in place  PT/OT eval obtained.  SNF recommended. Okay to continue Roxicodone PRN, Robaxin PRN for pain control.  Right facial wound Continue local wound care with topical antibiotics  Acute urinary retention  BPH Noted to have urine retention in the ED.  Foley catheter was inserted.  Removed today for voiding trial.   Resume  Flomax  Depression PTA on bupropion 150 mg twice daily?? and trazodone 100 mg nightly as needed. Resume bupropion daily today.  Goals of care   Code Status: Full Code.  Palliative care consulted    DVT prophylaxis:  heparin injection 5,000 Units Start: 01/22/23 1630   Antimicrobials: Stop orders for antibiotics today Fluid: Not on IV fluid currently Consultants: None Family Communication: None at bedside  Status: Inpatient Level of care:  Telemetry Medical   Patient from: Home Anticipated d/c to: PT recommended SNF Needs to continue in-hospital care:  Needs IV potassium and magnesium replacement.  Repeat labs tomorrow.     Diet:  Diet Order             DIET DYS 3 Room service appropriate? No; Fluid consistency: Thin  Diet effective now                   Scheduled Meds:  buPROPion  150 mg Oral Daily   Chlorhexidine Gluconate Cloth  6 each Topical Daily   ferrous sulfate  325 mg Oral Q breakfast   fludrocortisone  0.1 mg Oral Daily   heparin  5,000 Units Subcutaneous Q8H   l-methylfolate-B6-B12  1 tablet Oral Daily   lactulose  10 g Oral Daily   metoprolol tartrate  50 mg Oral BID   multivitamin with minerals  1 tablet Oral Daily   pantoprazole  40 mg Oral Daily   polymixin-bacitracin  1 Application Topical BID   potassium chloride  40 mEq Oral Q2H   senna  1 tablet Oral BID   sodium bicarbonate  1,300 mg Oral BID   sulfamethoxazole-trimethoprim  1 tablet Oral Q M,W,F   tacrolimus  1 mg Oral BID   tamsulosin  0.4 mg Oral Daily   thiamine  100 mg Oral Daily    PRN meds: acetaminophen **OR** acetaminophen, cyclobenzaprine, HYDROmorphone (DILAUDID) injection, LORazepam, LORazepam **OR** LORazepam, magnesium oxide, ondansetron, traZODone   Infusions:   magnesium sulfate bolus IVPB       Antimicrobials: Anti-infectives (From admission, onward)    Start     Dose/Rate Route Frequency Ordered Stop   01/24/23 1000  sulfamethoxazole-trimethoprim  (BACTRIM) 400-80 MG per tablet 1 tablet       Note to Pharmacy: continuous     1 tablet Oral Every M-W-F 01/22/23 1546     01/23/23 1300  ceFEPIme (MAXIPIME) 1 g in sodium chloride 0.9 % 100 mL IVPB  Status:  Discontinued        1 g 200 mL/hr over 30 Minutes Intravenous Every 24 hours 01/22/23 1236 01/23/23 0949   01/22/23 1235  vancomycin variable dose per unstable renal function (pharmacist dosing)  Status:  Discontinued         Does not apply See admin instructions 01/22/23 1236 01/23/23 0949   01/22/23 1215  vancomycin (VANCOREADY) IVPB 1500 mg/300 mL        1,500 mg 150 mL/hr over 120 Minutes Intravenous  Once 01/22/23 1209 01/22/23 1558   01/22/23 1215  ceFEPIme (MAXIPIME) 2 g in sodium chloride 0.9 % 100 mL IVPB        2 g 200 mL/hr over 30 Minutes Intravenous  Once 01/22/23 1209 01/22/23 1404       Nutritional status:  Body mass index is 20.95 kg/m.          Objective: Vitals:   01/25/23 0419 01/25/23 0807  BP: 135/84 (!) 138/91  Pulse: 90 94  Resp: 18 20  Temp: 98.8 F (37.1 C) 98.8 F (37.1 C)  SpO2: 95% 95%    Intake/Output Summary (Last 24 hours) at 01/25/2023 1129 Last data filed at 01/25/2023 1048 Gross per 24 hour  Intake 3277.28 ml  Output 2300 ml  Net 977.28 ml   Filed Weights   01/22/23 1104 01/22/23 1756  Weight: 72.6 kg 62.5 kg   Weight change:  Body mass index is 20.95 kg/m.   Physical Exam: General exam: Pleasant, elderly Caucasian male.  Chronic sick looking. Skin: Multiple rashes and abrasions in the body. HEENT: Atraumatic, normocephalic, no obvious bleeding.  Right facial area with wound as in the picture Lungs: Clear to auscultation bilaterally CVS: Regular rate and rhythm, no murmur GI/Abd soft, nontender, nondistended, bowel sound present CNS: Alert, awake, able to answer questions.  Psychiatry: Sad affect Extremities: No edema, no calf tenderness  Data Review: I have personally reviewed the laboratory data and studies  available.  F/u labs ordered Unresulted Labs (From admission, onward)     Start     Ordered   01/26/23 0500  Magnesium  Tomorrow morning,   R        01/25/23 1127   01/26/23 0500  Phosphorus  Tomorrow morning,   R        01/25/23 1127   01/25/23 0807  Iron and TIBC  (Anemia Panel (PNL))  Once,   R        01/25/23 0806   01/25/23 0807  Ferritin  (Anemia Panel (PNL))  Once,   R        01/25/23 0806   01/25/23 0445  Vitamin B12  Once,   R        01/25/23 0445   01/24/23 0500  CBC with Differential/Platelet  Daily,   R      01/23/23 0954   01/24/23 0500  Basic metabolic panel  Daily,   R      01/23/23 0954            Total time spent in review of labs and imaging, patient evaluation, formulation of plan, documentation and communication with family: 45 minutes  Signed, Lorin Glass, MD [facility] 01/25/2023

## 2023-01-25 NOTE — Progress Notes (Signed)
Mobility Specialist Progress Note   01/25/23 1732  Mobility  Activity Dangled on edge of bed  Level of Assistance Contact guard assist, steadying assist  Assistive Device None  Activity Response Tolerated well  Mobility Referral Yes  $Mobility charge 1 Mobility  Mobility Specialist Start Time (ACUTE ONLY) 1730  Mobility Specialist Stop Time (ACUTE ONLY) 1733  Mobility Specialist Time Calculation (min) (ACUTE ONLY) 3 min   Pt found setting off bed alarm trying to use urinal at EOB. Pt requiring increased time and minG to get EOB, void successful. Returned back supine w/ fault, bed alarm on and call bell in reach.    Frederico Hamman Mobility Specialist Please contact via SecureChat or  Rehab office at [phone]

## 2023-01-25 NOTE — Care Management Important Message (Signed)
Important Message  Patient Details  Name: [name] MRN: [id number] Date of Birth: [birth year]   Medicare Important Message Given:  Yes     [name] 01/25/2023, 2:46 PM

## 2023-01-26 DIAGNOSIS — Z944 Liver transplant status: Secondary | ICD-10-CM | POA: Diagnosis not present

## 2023-01-26 DIAGNOSIS — S32030A Wedge compression fracture of third lumbar vertebra, initial encounter for closed fracture: Secondary | ICD-10-CM

## 2023-01-26 DIAGNOSIS — N179 Acute kidney failure, unspecified: Secondary | ICD-10-CM | POA: Diagnosis not present

## 2023-01-26 DIAGNOSIS — N1831 Chronic kidney disease, stage 3a: Secondary | ICD-10-CM | POA: Diagnosis not present

## 2023-01-26 LAB — BASIC METABOLIC PANEL
Anion gap: 5 (ref 5–15)
BUN: 24 mg/dL — ABNORMAL HIGH (ref 8–23)
CO2: 23 mmol/L (ref 22–32)
Calcium: 8.1 mg/dL — ABNORMAL LOW (ref 8.9–10.3)
Chloride: 106 mmol/L (ref 98–111)
Creatinine, Ser: 1.91 mg/dL — ABNORMAL HIGH (ref 0.61–1.24)
GFR, Estimated: 37 mL/min — ABNORMAL LOW (ref >60)
Glucose, Bld: 98 mg/dL (ref 70–99)
Potassium: 3.6 mmol/L (ref 3.5–5.1)
Sodium: 134 mmol/L — ABNORMAL LOW (ref 135–145)

## 2023-01-26 LAB — PHOSPHORUS: Phosphorus: 1.3 mg/dL — ABNORMAL LOW (ref 2.5–4.6)

## 2023-01-26 LAB — CBC WITH DIFFERENTIAL/PLATELET
Abs Immature Granulocytes: 0.03 10*3/uL (ref 0.00–0.07)
Basophils Absolute: 0 10*3/uL (ref 0.0–0.1)
Basophils Relative: 0 %
Eosinophils Absolute: 0.2 10*3/uL (ref 0.0–0.5)
Eosinophils Relative: 6 %
HCT: 29.3 % — ABNORMAL LOW (ref 39.0–52.0)
Hemoglobin: 9.9 g/dL — ABNORMAL LOW (ref 13.0–17.0)
Immature Granulocytes: 1 %
Lymphocytes Relative: 19 %
Lymphs Abs: 0.6 10*3/uL — ABNORMAL LOW (ref 0.7–4.0)
MCH: 33.4 pg (ref 26.0–34.0)
MCHC: 33.8 g/dL (ref 30.0–36.0)
MCV: 99 fL (ref 80.0–100.0)
Monocytes Absolute: 0.3 10*3/uL (ref 0.1–1.0)
Monocytes Relative: 10 %
Neutro Abs: 2.1 10*3/uL (ref 1.7–7.7)
Neutrophils Relative %: 64 %
Platelets: 183 10*3/uL (ref 150–400)
RBC: 2.96 MIL/uL — ABNORMAL LOW (ref 4.22–5.81)
RDW: 13.1 % (ref 11.5–15.5)
WBC: 3.3 10*3/uL — ABNORMAL LOW (ref 4.0–10.5)
nRBC: 0 % (ref 0.0–0.2)

## 2023-01-26 LAB — MAGNESIUM: Magnesium: 1.6 mg/dL — ABNORMAL LOW (ref 1.7–2.4)

## 2023-01-26 MED ORDER — MAGNESIUM SULFATE 4 GM/100ML IV SOLN
4.0000 g | Freq: Once | INTRAVENOUS | Status: AC
  Administered 2023-01-26: 4 g via INTRAVENOUS
  Filled 2023-01-26: qty 100

## 2023-01-26 MED ORDER — SODIUM PHOSPHATES 45 MMOLE/15ML IV SOLN
30.0000 mmol | Freq: Once | INTRAVENOUS | Status: AC
  Administered 2023-01-26: 30 mmol via INTRAVENOUS
  Filled 2023-01-26: qty 10

## 2023-01-26 NOTE — Progress Notes (Signed)
PROGRESS NOTE  [name]  DOB: Apr 02, 1951  PCP: Aviva Kluver WUJ:[id number]  DOA: 01/22/2023  LOS: 4 days  Hospital Day: 5  Brief narrative: [name] is a 72 y.o. 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  Also with history of depression, CKD, GERD, osteoporosis, basal cell cancer, severe spinal stenosis L4,5, advanced spinal stenosis L 3,4 and Cervical spinal stenosis followed at [facility], h/o right L4-5 facet cystectomy in 2017 Patient lives at home alone.  5/18, patient was brought to the ED by EMS after he was found on the floor unable to get up. Unclear circumstances of fall.  Apparently fell 3 days ago and could not get up.  Welfare check was issued by neighbor.  EMS found him on the floor facedown, lethargic and brought to the ED.  In the ED, afebrile, heart rate 120s, blood pressure 120s, breathing on room air. On exam, patient was arousable but slow to respond, unable to recall the details of fall. He was noted to have multiple facial injuries, swelling around right orbit with difficulty opening right eye.  He overall looked dry, had skin breakdown and multiple places in his body and feet.   Initial labs with normal WBC count, normal lactic acid, normal hemoglobin with MCV 104, BUN/creatinine elevated to 64/5.65, total CK 208, urinalysis unremarkable. Blood culture sent Skeletal survey done with CT head, CT maxillofacial, CT cervical thoracic, lumbar spine, CT chest abdomen pelvis, x-rays of the knee, shoulder. Significant findings being: CT head -no acute intracranial abnormality.  Chronic microvascular disease and atrophy CT maxillofacial with soft tissue swelling around right orbit and no facial or orbital fracture CT cervical/thoracic/lumbar spine -acute superior endplate fracture at T3 with 30% height loss, chronic compression fracture of T5 T9 and  L1, multilevel spondylosis of the lumbar spine EKG with SVT at 163.   Patient was given IV hydration, broad-spectrum IV antibiotics and 1 push of IV Cardizem Admitted to [facility].  Subjective: Patient was seen and examined this morning. Lying on bed.  Not in distress.  Feels better but wants to go home.  I discussed with OT who saw him earlier.  Patient still has significant lack of coordination and balance when ambulating.  His significant other is currently hospitalized due to ankle fracture.  It seems patient is not safe for discharge to home This Morning with Low Electrolytes.  Assessment and plan: AKI on CKD 4 Chronic metabolic acidosis Baseline creatinine 2.55 from April 2023  Presented with creatinine elevated to 5.65.  Due to dehydration for 3 days after fall.   He was started on IV fluid after which creatinine gradually improved, 1.91 today.  Currently not on IV fluid.  Encourage oral hydration.   Chronically on sodium bicarb 1300 mg twice daily.  Continue the same. Patient was apparently started in process for renal transplant at [facility] last August but isn't on the list yet.  I really wonder given his overall poor physical condition, if he really qualifies for transplant. Recent Labs    01/22/23 1123 01/23/23 0600 01/24/23 0136 01/25/23 0445 01/26/23 0146  BUN 64* 52* 45* 30* 24*  CREATININE 5.65* 3.75* 3.10* 2.21* 1.91*   Severe hypokalemia/hypomagnesemia/hypophosphatemia All electrolyte levels low as below.  Replacement ordered.  Recheck in a.m. Recent Labs  Lab 01/22/23 1123 01/23/23 0600 01/24/23 0136 01/25/23 0445 01/26/23 0146  K 4.8 3.9 3.2* 2.6* 3.6  MG  --   --   --  1.2* 1.6*  PHOS  --   --   --   --  1.3*   Chronic ongoing alcoholism H/o liver transplants twice (2004 and 2012) Does admit to drinking at least up into a day.  Also reports adherence to tacrolimus Watch out for withdrawal symptoms.  On CIWA protocol.  Normal ammonia level Continue  tacrolimus Counseled to quit alcohol Continue PPI Recent Labs  Lab 01/22/23 1123 01/23/23 0600 01/24/23 0136 01/25/23 0445 01/26/23 0146  AST 46*  --   --   --   --   ALT 31  --   --   --   --   ALKPHOS 91  --   --   --   --   BILITOT 1.4*  --   --   --   --   PROT 7.6  --   --   --   --   ALBUMIN 3.4*  --   --   --   --   AMMONIA  --   --  19  --   --   INR 1.1  --   --   --   --   PLT 235 170 158 161 183   Chronic macrocytic anemia Macrocytosis due to alcohol use.  Initial hemoglobin was in normal range due to hemoconcentration.  No active bleeding.  Hemoglobin close to 10. Recent Labs    01/22/23 1123 01/23/23 0600 01/24/23 0136 01/25/23 0445 01/26/23 0146  HGB 14.2 12.4* 11.1* 9.8* 9.9*  MCV 103.7* 103.2* 102.4* 101.7* 99.0  VITAMINB12  --   --   --  1,707*  --   FOLATE  --   --   --  11.2  --   FERRITIN  --   --   --  1,586*  --   TIBC  --   --   --  133*  --   IRON  --   --   --  13*  --   RETICCTPCT  --   --   --  1.1  --    Acute metabolic encephalopathy Dysphagia Mental status improved. Ammonia level normal.  I would minimize use of lactulose to avoid dehydration but target 1-2 bowel movements a day Speech therapy eval obtained.  Upgraded to dysphagia 3 diet on 5/20.  Hypertension PTA list shows Lopressor 50 mg twice daily, amlodipine 5 mg daily. Currently blood pressure and heart rate are controlled on Lopressor only.  Impaired mobility  Falls Chronic back pain Spinal stenosis of lumbar region at multiple levels Follows up at [facility] for severe stenosis L 45, advanced stenosis L 34.  Skeletal survey did not show any evidence of fracture  He has a back brace in place  PT/OT eval obtained.  SNF recommended. Okay to continue Roxicodone PRN, Robaxin PRN for pain control.  Right facial wound Continue local wound care with topical antibiotics  Acute urinary retention  BPH Noted to have urine retention in the ED.  Foley catheter was inserted,  removed on 5/21.  Passed voiding trial.  Continue Flomax as before.  Depression PTA on bupropion 150 mg twice daily?? and trazodone 100 mg nightly as needed. Currently on bupropion daily .  Goals of care   Code Status: Full Code.  Palliative care consulted    DVT prophylaxis:  heparin injection 5,000 Units Start: 01/22/23 1630   Antimicrobials: Stop orders for antibiotics today Fluid: Not on  IV fluid currently Consultants: None Family Communication: None at bedside  Status: Inpatient Level of care:  Telemetry Medical   Patient from: Home Anticipated d/c to: PT recommended SNF Needs to continue in-hospital care:  Needs aggressive electrolyte replacement.  Repeat labs tomorrow.    Diet:  Diet Order             DIET DYS 3 Room service appropriate? No; Fluid consistency: Thin  Diet effective now                   Scheduled Meds:  buPROPion  150 mg Oral Daily   Chlorhexidine Gluconate Cloth  6 each Topical Daily   ferrous sulfate  325 mg Oral Q breakfast   fludrocortisone  0.1 mg Oral Daily   heparin  5,000 Units Subcutaneous Q8H   l-methylfolate-B6-B12  1 tablet Oral Daily   lactulose  10 g Oral Daily   metoprolol tartrate  50 mg Oral BID   multivitamin with minerals  1 tablet Oral Daily   pantoprazole  40 mg Oral Daily   polymixin-bacitracin  1 Application Topical BID   senna  1 tablet Oral BID   sodium bicarbonate  1,300 mg Oral BID   sulfamethoxazole-trimethoprim  1 tablet Oral Q M,W,F   tacrolimus  1 mg Oral BID   tamsulosin  0.4 mg Oral Daily   thiamine  100 mg Oral Daily    PRN meds: acetaminophen **OR** acetaminophen, cyclobenzaprine, HYDROmorphone (DILAUDID) injection, LORazepam, magnesium oxide, ondansetron, traZODone   Infusions:   sodium phosphate 30 mmol in dextrose 5 % 250 mL infusion 30 mmol (01/26/23 1130)     Antimicrobials: Anti-infectives (From admission, onward)    Start     Dose/Rate Route Frequency Ordered Stop   01/24/23 1000   sulfamethoxazole-trimethoprim (BACTRIM) 400-80 MG per tablet 1 tablet       Note to Pharmacy: continuous     1 tablet Oral Every M-W-F 01/22/23 1546     01/23/23 1300  ceFEPIme (MAXIPIME) 1 g in sodium chloride 0.9 % 100 mL IVPB  Status:  Discontinued        1 g 200 mL/hr over 30 Minutes Intravenous Every 24 hours 01/22/23 1236 01/23/23 0949   01/22/23 1235  vancomycin variable dose per unstable renal function (pharmacist dosing)  Status:  Discontinued         Does not apply See admin instructions 01/22/23 1236 01/23/23 0949   01/22/23 1215  vancomycin (VANCOREADY) IVPB 1500 mg/300 mL        1,500 mg 150 mL/hr over 120 Minutes Intravenous  Once 01/22/23 1209 01/22/23 1558   01/22/23 1215  ceFEPIme (MAXIPIME) 2 g in sodium chloride 0.9 % 100 mL IVPB        2 g 200 mL/hr over 30 Minutes Intravenous  Once 01/22/23 1209 01/22/23 1404       Nutritional status:  Body mass index is 20.95 kg/m.          Objective: Vitals:   01/26/23 0423 01/26/23 0814  BP: (!) 154/86 127/73  Pulse: 77 72  Resp: 20   Temp: 98.6 F (37 C) 98.1 F (36.7 C)  SpO2: 96% 96%    Intake/Output Summary (Last 24 hours) at 01/26/2023 1405 Last data filed at 01/26/2023 0846 Gross per 24 hour  Intake 720 ml  Output 1800 ml  Net -1080 ml   Filed Weights   01/22/23 1104 01/22/23 1756  Weight: 72.6 kg 62.5 kg   Weight change:  Body  mass index is 20.95 kg/m.   Physical Exam: General exam: Pleasant, elderly Caucasian male.  Chronically sick looking. Skin: Multiple rashes and abrasions in the body. HEENT: Atraumatic, normocephalic, no obvious bleeding.  Right facial area with wound as in the picture Lungs: Clear to auscultation bilaterally CVS: Regular rate and rhythm, no murmur GI/Abd soft, nontender, nondistended, bowel sound present CNS: Alert, awake, able to answer questions.   Psychiatry: Frustrated because he is not able to return home yet Extremities: No edema, no calf tenderness  Data  Review: I have personally reviewed the laboratory data and studies available.  F/u labs ordered Unresulted Labs (From admission, onward)     Start     Ordered   01/27/23 0500  CBC with Differential/Platelet  Tomorrow morning,   R        01/26/23 1404   01/27/23 0500  Basic metabolic panel  Tomorrow morning,   R        01/26/23 1404   01/27/23 0500  Phosphorus  Tomorrow morning,   R        01/26/23 1404   01/27/23 0500  Magnesium  Tomorrow morning,   R        01/26/23 1404            Total time spent in review of labs and imaging, patient evaluation, formulation of plan, documentation and communication with family: 45 minutes  Signed, Lorin Glass, MD [facility] 01/26/2023

## 2023-01-26 NOTE — Progress Notes (Signed)
Occupational Therapy Treatment Patient Details Name: [name] MRN: [id number] DOB: 1950/12/14 Today's Date: 01/26/2023   History of present illness Pt is a 72 y/o M presenting to ED on 5/18 after fall at home, was immobilized for 3 days, admitted for AKI. Workup revealing severe spinal stenosis L4-5, and advanced spinal stenosis L3-4, nondispalced fx of R 3rd metacarpal. PMH includes 2 liver transplants, GERD, HTN, alcohol abuse, osteoporosis, basal cell carcinoma.   OT comments  Pt progressing towards OT goals this session. Pt with decreased safety awareness throughout session and continues to present as high fall risk. Pt concerned about dogs at home. Engaged Pt in safety concerns with back precautions and taking care of animals - especially as his girlfriend (who he lives with) is currently hospitalized with a broken foot/ankle?!?! And will be in a boot and not at her best. Pt required cues for safety with RW throughout session and extensive re-education about back brace don/doff and when to wear. When OT asked about r hand - no complaints. Pt is determined to return home and updated from SNF to [facility] due to complicated dc and Pt decline to go to facility. OT will attempt to maximize services acutely to maximize safety and independence in ADL and functional transfers.    Recommendations for follow up therapy are one component of a multi-disciplinary discharge planning process, led by the attending physician.  Recommendations may be updated based on patient status, additional functional criteria and insurance authorization.    Assistance Recommended at Discharge Frequent or constant Supervision/Assistance  Patient can return home with the following  A lot of help with bathing/dressing/bathroom;Assistance with cooking/housework;Direct supervision/assist for medications management;Direct supervision/assist for financial management;Assist for transportation;Help with stairs or ramp for entrance;A  little help with walking and/or transfers   Equipment Recommendations  BSC/3in1    Recommendations for Other Services PT consult    Precautions / Restrictions Precautions Precautions: Fall;Back Precaution Comments: re-educated on BLT back precautions - does not maintain without cues and cannot recall from previous sessions Required Braces or Orthoses: Spinal Brace Spinal Brace: Thoracolumbosacral orthotic;Applied in sitting position Restrictions Weight Bearing Restrictions: No       Mobility Bed Mobility Overal bed mobility: Needs Assistance Bed Mobility: Sidelying to Sit, Rolling, Sit to Sidelying Rolling: Supervision Sidelying to sit: Min assist     Sit to sidelying: Min guard General bed mobility comments: light min A for trunk elevation from sidelying    Transfers Overall transfer level: Needs assistance Equipment used: Rolling walker (2 wheels) Transfers: Sit to/from Stand, Bed to chair/wheelchair/BSC Sit to Stand: Min assist     Step pivot transfers: Min assist     General transfer comment: Pt needs min A to rise from bed and recliner, mod A from very low toilet in bathroom. vc for safe hand placement each time.     Balance Overall balance assessment: Needs assistance Sitting-balance support: Feet supported Sitting balance-Leahy Scale: Fair     Standing balance support: During functional activity, Reliant on assistive device for balance Standing balance-Leahy Scale: Poor Standing balance comment: able to maintain static standing without UE support, BUE during dynamic tasks                           ADL either performed or assessed with clinical judgement   ADL Overall ADL's : Needs assistance/impaired Eating/Feeding: NPO   Grooming: Wash/dry hands;Brushing hair;Minimal assistance;Standing;Cueing for safety;Cueing for compensatory techniques Grooming Details (indicate cue type and reason):  focused on functional tasks in standing and  maintaining back precautions - requires mod vc         Upper Body Dressing : Moderate assistance;Standing Upper Body Dressing Details (indicate cue type and reason): to don brace. spent significant time teaching Pt how to don brace, proper positioning, sequencing etc. Pt verbalized understanding, will need reinforcement     Toilet Transfer: Minimal assistance;Ambulation;Rolling walker (2 wheels);Regular Toilet;Grab bars Toilet Transfer Details (indicate cue type and reason): vc for safe hand placement and use of RW (Pt prefers to furniture surf and needs cues for safety) Toileting- Clothing Manipulation and Hygiene: Min guard;Sitting/lateral lean Toileting - Clothing Manipulation Details (indicate cue type and reason): bends forward initially for rear peri care - cues for baclk precautions     Functional mobility during ADLs: Rolling walker (2 wheels);Minimal assistance;Cueing for sequencing;Cueing for safety General ADL Comments: decreased safety awareness, edcuation for back precautions and brace    Extremity/Trunk Assessment              Vision       Perception     Praxis      Cognition Arousal/Alertness: Awake/alert Behavior During Therapy: WFL for tasks assessed/performed Overall Cognitive Status: Within Functional Limits for tasks assessed Area of Impairment: Attention, Following commands, Safety/judgement, Awareness, Problem solving, Orientation                 Orientation Level: Situation, Time, Place Current Attention Level: Focused   Following Commands: Follows one step commands with increased time Safety/Judgement: Decreased awareness of safety, Decreased awareness of deficits Awareness: Emergent (reports need to urinate) Problem Solving: Slow processing General Comments: some short term memory deficits, did not remember PT session from Sunday, but otherwise A&Ox4        Exercises      Shoulder Instructions       General Comments very poor  safety awareness    Pertinent Vitals/ Pain          Home Living                                          Prior Functioning/Environment              Frequency  Min 1X/week        Progress Toward Goals  OT Goals(current goals can now be found in the care plan section)  Progress towards OT goals: Progressing toward goals  Acute Rehab OT Goals Patient Stated Goal: get home to dogs OT Goal Formulation: With patient Time For Goal Achievement: 02/06/23 Potential to Achieve Goals: Good ADL Goals Pt Will Perform Upper Body Dressing: with min guard assist;sitting Pt Will Perform Lower Body Dressing: with min guard assist;sitting/lateral leans;sit to/from stand Pt Will Transfer to Toilet: with min assist;squat pivot transfer;stand pivot transfer;bedside commode Additional ADL Goal #1: pt will follow 2 step command in prep for ADLs  Plan Discharge plan needs to be updated (Pt unable to get into SNF)    Co-evaluation                 AM-PAC OT "6 Clicks" Daily Activity     Outcome Measure   Help from another person eating meals?: None Help from another person taking care of personal grooming?: A Little Help from another person toileting, which includes using toliet, bedpan, or urinal?: A Lot Help from another person bathing (including washing, rinsing,  drying)?: A Lot Help from another person to put on and taking off regular upper body clothing?: A Lot Help from another person to put on and taking off regular lower body clothing?: A Lot 6 Click Score: 15    End of Session Equipment Utilized During Treatment: Rolling walker (2 wheels);Gait belt;Back brace  OT Visit Diagnosis: Unsteadiness on feet (R26.81);Other abnormalities of gait and mobility (R26.89);Muscle weakness (generalized) (M62.81);History of falling (Z91.81);Repeated falls (R29.6)   Activity Tolerance Patient tolerated treatment well   Patient Left in bed;with call bell/phone within  reach;with bed alarm set   Nurse Communication Mobility status (BM)        Time: 1610-9604 OT Time Calculation (min): 27 min  Charges: OT General Charges $OT Visit: 1 Visit OT Treatments $Self Care/Home Management : 8-22 mins $Therapeutic Activity: 8-22 mins  Nyoka Cowden OTR/L Acute Rehabilitation Services Office: [phone]  Emelda Fear 01/26/2023, 1:31 PM

## 2023-01-26 NOTE — Progress Notes (Signed)
Patient ID: [name], male   DOB: [birth year], 72 y.o.   MRN: [id number]    Progress Note from the Palliative Medicine Team at [facility]   Patient Name: [name]        Date: 01/26/2023 DOB: [birth year]  Age: 72 y.o. MRN#: [id number] Attending Physician: Lorin Glass, MD Primary Care Physician: Pcp, No Admit Date: 01/22/2023   Medical records reviewed    72 y.o. 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 By report he fell at home 3 days PTA and was immobilized. EMS activated and patient transported to [facility] for evaluation. By record review he does have severe spinal stenosis L4,5, advanced spinal stenosis L 3,4 and Cervical spinal stenosis followed at [facility].   Initial 01-26-23   This NP assessed patient at the bedside as a follow up to  yesterday's GOCs meeting and to assess for palliative medicine needs and emotional support.  Patient is alert and oriented.  I shared with him that I was able to speak to his sister [name] today by telephone.  Expressed concerns over increasing nursing care needs and safety on discharge.  Today patient tells me he will consider rehabilitation but that he needs to go home first.  He expresses that he has significant financial needs that need to be addressed.  In conversation with his sister, she expresses her concerns regarding the fact that the patient continues to seek health care in [place] instead of securing healthcare benefits here in [place].  She agrees the patient could benefit from some short-term rehab hoping that he regain strength and ability to return home.  She  agrees the patient could benefit from outpatient alcohol rehab.  Education offered today  to patient regarding  the importance of continued conversation with his family and the medical providers regarding overall plan of care and treatment options,  ensuring decisions are within the context of the patients values and GOCs.  Again I reinforced the importance of self advocacy and responsibility  in securing a patient centered treatment plan  Questions and concerns addressed   Discussed with TOC team   Time: 50 minutes  Detailed review of medical records ( labs, imaging, vital signs), medically appropriate exam ( MS, skin, resp)   discussed with treatment team, counseling and education to patient, family, staff, documenting clinical information, medication management, coordination of care    Lorinda Creed NP  Palliative Medicine Team Team Phone # [phone] Pager [phone]

## 2023-01-27 DIAGNOSIS — N179 Acute kidney failure, unspecified: Secondary | ICD-10-CM | POA: Diagnosis not present

## 2023-01-27 LAB — CBC WITH DIFFERENTIAL/PLATELET
Abs Immature Granulocytes: 0.03 10*3/uL (ref 0.00–0.07)
Basophils Absolute: 0 10*3/uL (ref 0.0–0.1)
Basophils Relative: 0 %
Eosinophils Absolute: 0.2 10*3/uL (ref 0.0–0.5)
Eosinophils Relative: 7 %
HCT: 29 % — ABNORMAL LOW (ref 39.0–52.0)
Hemoglobin: 9.8 g/dL — ABNORMAL LOW (ref 13.0–17.0)
Immature Granulocytes: 1 %
Lymphocytes Relative: 17 %
Lymphs Abs: 0.6 10*3/uL — ABNORMAL LOW (ref 0.7–4.0)
MCH: 32.8 pg (ref 26.0–34.0)
MCHC: 33.8 g/dL (ref 30.0–36.0)
MCV: 97 fL (ref 80.0–100.0)
Monocytes Absolute: 0.3 10*3/uL (ref 0.1–1.0)
Monocytes Relative: 10 %
Neutro Abs: 2.2 10*3/uL (ref 1.7–7.7)
Neutrophils Relative %: 65 %
Platelets: 211 10*3/uL (ref 150–400)
RBC: 2.99 MIL/uL — ABNORMAL LOW (ref 4.22–5.81)
RDW: 13 % (ref 11.5–15.5)
WBC: 3.3 10*3/uL — ABNORMAL LOW (ref 4.0–10.5)
nRBC: 0 % (ref 0.0–0.2)

## 2023-01-27 LAB — BASIC METABOLIC PANEL
Anion gap: 9 (ref 5–15)
BUN: 20 mg/dL (ref 8–23)
CO2: 25 mmol/L (ref 22–32)
Calcium: 8.1 mg/dL — ABNORMAL LOW (ref 8.9–10.3)
Chloride: 103 mmol/L (ref 98–111)
Creatinine, Ser: 2.07 mg/dL — ABNORMAL HIGH (ref 0.61–1.24)
GFR, Estimated: 34 mL/min — ABNORMAL LOW (ref >60)
Glucose, Bld: 110 mg/dL — ABNORMAL HIGH (ref 70–99)
Potassium: 3.3 mmol/L — ABNORMAL LOW (ref 3.5–5.1)
Sodium: 137 mmol/L (ref 135–145)

## 2023-01-27 LAB — PHOSPHORUS: Phosphorus: 3.4 mg/dL (ref 2.5–4.6)

## 2023-01-27 LAB — CULTURE, BLOOD (ROUTINE X 2): Culture: NO GROWTH

## 2023-01-27 LAB — MAGNESIUM: Magnesium: 2.3 mg/dL (ref 1.7–2.4)

## 2023-01-27 MED ORDER — SENNOSIDES-DOCUSATE SODIUM 8.6-50 MG PO TABS: 1.0000 | ORAL_TABLET | Freq: Every day | ORAL | 0 refills | Status: AC

## 2023-01-27 MED ORDER — BUPROPION HCL ER (SR) 150 MG PO TB12: 150.0000 mg | ORAL_TABLET | Freq: Every day | ORAL | Status: AC

## 2023-01-27 MED ORDER — POTASSIUM CHLORIDE CRYS ER 20 MEQ PO TBCR
40.0000 meq | EXTENDED_RELEASE_TABLET | Freq: Once | ORAL | Status: AC
  Administered 2023-01-27: 40 meq via ORAL
  Filled 2023-01-27: qty 2

## 2023-01-27 MED ORDER — PANTOPRAZOLE SODIUM 40 MG PO TBEC: 40.0000 mg | DELAYED_RELEASE_TABLET | Freq: Every day | ORAL | 0 refills | Status: AC

## 2023-01-27 MED ORDER — FERROUS SULFATE 325 (65 FE) MG PO TABS: 325.0000 mg | ORAL_TABLET | Freq: Every day | ORAL | 0 refills | Status: DC

## 2023-01-27 NOTE — TOC Transition Note (Signed)
Transition of Care [facility]) - CM/SW Discharge Note   Patient Details  Name: [name] MRN: [id number] Date of Birth: 09-25-1950  Transition of [facility] CM/SW Contact:  Tom-Johnson, Hershal Coria, RN Phone Number: 01/27/2023, 2:09 PM   Clinical Narrative:     Patient is scheduled for discharge today.  Readmission Risk Assessment done. Home health referral called in to [facility] and Amy noted acceptance, info on AVS. Outpatient referral, hospital f/u and discharge instructions on AVS. RW and Bedside Commode ordered from [employer] and Earna Coder to deliver to patient at bedside. Neighbor, Misty Stanley to transport at discharge.  No further TOC needs noted  Final next level of care: Home w Home Health Services Barriers to Discharge: Barriers Resolved   Patient Goals and CMS Choice CMS Medicare.gov Compare Post Acute Care list provided to:: Patient Choice offered to / list presented to : Patient  Discharge Placement                  Patient to be transferred to facility by: Neighbor Name of family member notified: [facility] and Services Additional resources added to the After Visit Summary for   In-house Referral: Clinical Social Work              DME Arranged: Arts development officer, [employer] fast streamer DME Agency: [employer] Date DME Agency Contacted: 01/27/23 Time DME Agency Contacted: 1300 Representative spoke with at DME Agency: Earna Coder HH Arranged: PT, OT HH Agency: [facility] Date [facility] Agency Contacted: 01/27/23 Time HH Agency Contacted: 1218 Representative spoke with at [facility] Agency: Amy  Social Determinants of Health (SDOH) Interventions SDOH Screenings   Food Insecurity: No Food Insecurity (01/22/2023)  Housing: Low Risk  (01/22/2023)  Transportation Needs: No Transportation Needs (01/22/2023)  Utilities: Not At Risk (01/22/2023)  Tobacco Use: Low Risk  (01/22/2023)     Readmission Risk Interventions    01/27/2023    2:01 PM  Readmission Risk  Prevention Plan  Post Dischage Appt Complete  Medication Screening Complete  Transportation Screening Complete

## 2023-01-27 NOTE — Progress Notes (Signed)
AVS discharge instructions reviewed with patient and he verbalized understanding. PIV x2 removed. Patient clothing soiled, patient dressed in paper scrubs. Still awaiting BSC delivery.

## 2023-01-27 NOTE — Progress Notes (Cosign Needed)
    Durable Medical Equipment  (From admission, onward)           Start     Ordered   01/27/23 1346  For home use only DME Walker rolling  Once       Question Answer Comment  Walker: With 5 Inch Wheels   Patient needs a walker to treat with the following condition Gait instability      01/27/23 1407   01/27/23 1346  For home use only DME Bedside commode  Once       Comments: Pt's generalized weakness and decreased activity tolerance necessitate recommendation for bedside commode as he is not able to ambulate to the bathroom.  Question:  Patient needs a bedside commode to treat with the following condition  Answer:  Weakness   01/27/23 1407

## 2023-01-27 NOTE — Progress Notes (Signed)
PT Cancellation Note  Patient Details Name: [name] MRN: [id number] DOB: 01-Oct-1950   Cancelled Treatment:    Reason Eval/Treat Not Completed: (P) Patient declined, no reason specified, pt declined mobility despite encouragement, stating he is going home and needs to rest. Reviewed all precautions and brace wear with pt verbalizing understanding and without questions or concerns. Will continue to follow acutely.   Lenora Boys. PTA Acute Rehabilitation Services Office: [phone]    Catalina Antigua 01/27/2023, 1:40 PM

## 2023-01-27 NOTE — Care Management Important Message (Signed)
Important Message  Patient Details  Name: Kort Runck MRN: [id number] Date of Birth: March 02, 1951   Medicare Important Message Given:  Yes     [name] 01/27/2023, 4:05 PM

## 2023-01-27 NOTE — Discharge Summary (Signed)
Physician Discharge Summary  [name] WGN:[id number] DOB: 10/11/1950 DOA: 01/22/2023  PCP: Pcp, No  Admit date: 01/22/2023 Discharge date: 01/27/2023  Admitted From: Home Discharge disposition: Home with home health  Recommendations at discharge:  Encourage oral hydration Encourage use of back brace, assistive device for ambulation Encourage regular bowel movement   Brief narrative: [name] is a 72 y.o. 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  Also with history of depression, CKD, GERD, osteoporosis, basal cell cancer, severe spinal stenosis L4,5, advanced spinal stenosis L 3,4 and Cervical spinal stenosis followed at [facility], h/o right L4-5 facet cystectomy in 2017 Patient lives at home alone.  5/18, patient was brought to the ED by EMS after he was found on the floor unable to get up. Unclear circumstances of fall.  Apparently fell 3 days ago and could not get up.  Welfare check was issued by neighbor.  EMS found him on the floor facedown, lethargic and brought to the ED.  In the ED, afebrile, heart rate 120s, blood pressure 120s, breathing on room air. On exam, patient was arousable but slow to respond, unable to recall the details of fall. He was noted to have multiple facial injuries, swelling around right orbit with difficulty opening right eye.  He overall looked dry, had skin breakdown and multiple places in his body and feet.   Initial labs with normal WBC count, normal lactic acid, normal hemoglobin with MCV 104, BUN/creatinine elevated to 64/5.65, total CK 208, urinalysis unremarkable. Blood culture sent Skeletal survey done with CT head, CT maxillofacial, CT cervical thoracic, lumbar spine, CT chest abdomen pelvis, x-rays of the knee, shoulder. Significant findings being: CT head -no acute intracranial abnormality.  Chronic microvascular  disease and atrophy CT maxillofacial with soft tissue swelling around right orbit and no facial or orbital fracture CT cervical/thoracic/lumbar spine -acute superior endplate fracture at T3 with 30% height loss, chronic compression fracture of T5 T9 and L1, multilevel spondylosis of the lumbar spine EKG with SVT at 163.   Patient was given IV hydration, broad-spectrum IV antibiotics and 1 push of IV Cardizem Admitted to [facility].  Subjective: Patient was seen and examined this morning. Lying on bed not in distress.  Awake, alert, able to have a meaningful conversation.  Labs this morning showed improvement in electrolytes.   I discussed with him about the recommendation by therapist to go to SNF.  He wants to go home.  He states his significant other will be discharged from the hospital tomorrow  Home health service arranged   Hospital course: AKI on CKD 4 Chronic metabolic acidosis Baseline creatinine 2.55 from April 2023  Presented with creatinine elevated to 5.65.  Due to dehydration for 3 days after fall.   He was started on IV fluid after which creatinine gradually improved, 2.07 today.  Encourage oral hydration.   Chronically on sodium bicarb 1300 mg twice daily.  Continue the same. Patient was apparently started in process for renal transplant at [facility] last August but isn't on the list yet.  I really wonder given his overall poor physical condition, if he really qualifies for transplant. Recent Labs    01/22/23 1123 01/23/23 0600 01/24/23 0136 01/25/23 0445 01/26/23 0146 01/27/23 0128  BUN 64* 52* 45* 30* 24* 20  CREATININE 5.65* 3.75* 3.10* 2.21* 1.91* 2.07*   Severe hypokalemia/hypomagnesemia/hypophosphatemia All electrolyte levels were low yesterday.  Improved with replacement.  Potassium level was low this morning.  Replacement given. Recent Labs  Lab 01/23/23 0600 01/24/23 0136 01/25/23 0445 01/26/23 0146 01/27/23 0128  K 3.9 3.2* 2.6* 3.6 3.3*  MG  --   --  1.2*  1.6* 2.3  PHOS  --   --   --  1.3* 3.4   Chronic ongoing alcoholism H/o liver transplants twice (2004 and 2012) Does admit to drinking at least up into a day.  Also reports adherence to tacrolimus He did not have withdrawal symptoms in the hospital.  Ammonia level was normal.   Continue tacrolimus Counseled to quit alcohol Continue PPI Recent Labs  Lab 01/22/23 1123 01/23/23 0600 01/24/23 0136 01/25/23 0445 01/26/23 0146 01/27/23 0128  AST 46*  --   --   --   --   --   ALT 31  --   --   --   --   --   ALKPHOS 91  --   --   --   --   --   BILITOT 1.4*  --   --   --   --   --   PROT 7.6  --   --   --   --   --   ALBUMIN 3.4*  --   --   --   --   --   AMMONIA  --   --  19  --   --   --   INR 1.1  --   --   --   --   --   PLT 235 170 158 161 183 211   Chronic macrocytic anemia Macrocytosis due to alcohol use.  Initial hemoglobin was in normal range due to hemoconcentration.  No active bleeding.  Hemoglobin close to 10. Recent Labs    01/23/23 0600 01/24/23 0136 01/25/23 0445 01/26/23 0146 01/27/23 0128  HGB 12.4* 11.1* 9.8* 9.9* 9.8*  MCV 103.2* 102.4* 101.7* 99.0 97.0  VITAMINB12  --   --  1,707*  --   --   FOLATE  --   --  11.2  --   --   FERRITIN  --   --  1,586*  --   --   TIBC  --   --  133*  --   --   IRON  --   --  13*  --   --   RETICCTPCT  --   --  1.1  --   --    Acute metabolic encephalopathy Dysphagia Mental status improved. Ammonia level normal.  Continue as needed lactulose at home.   Per recommendation by speech therapist, patient is currently on dysphagia 3 diet.  Hypertension PTA list shows Lopressor 50 mg twice daily, amlodipine 5 mg daily. Currently blood pressure and heart rate are controlled on Lopressor only.  Impaired mobility  Falls Chronic back pain Spinal stenosis of lumbar region at multiple levels Follows up at [facility] spine center for severe stenosis L 45, advanced stenosis L 34.  Skeletal survey did not show any evidence of fracture   He is supposed to have a back brace in place.  Encourage compliance with it. PT/OT eval obtained.  SNF recommended. Okay to continue Roxicodone PRN, Robaxin PRN for pain control.  Right facial wound Continue local wound care with topical antibiotics  Acute urinary retention  BPH Noted to have urine retention in the ED.  Foley catheter was inserted, removed on 5/21.  Passed voiding trial.  Continue Flomax as  before.  Depression PTA on bupropion 150 mg twice daily?? and trazodone 100 mg nightly as needed. Currently on bupropion daily .  Goals of care   Code Status: Full Code.  Palliative care consulted  Wounds: Wound / Incision (Open or Dehisced) 01/22/23 Skin tear Eye Right dried, swollen, reddened area around eye, eye swollen shut (Active)  Date First Assessed/Time First Assessed: 01/22/23 1818   Wound Type: Skin tear  Location: Eye  Location Orientation: Right  Wound Description (Comments): dried, swollen, reddened area around eye, eye swollen shut    Assessments 01/22/2023  5:57 PM 01/27/2023  8:00 AM  Dressing Type -- None  Site / Wound Assessment -- Clean;Dry  Drainage Amount Scant None  Drainage Description Purulent --     No associated orders.    Discharge Exam:   Vitals:   01/26/23 1511 01/26/23 2037 01/27/23 0448 01/27/23 0943  BP: (!) 156/88 139/89 (!) 158/85 129/77  Pulse: 80 82 77 81  Resp: 18 19  17   Temp: 98.2 F (36.8 C) 98.1 F (36.7 C) 98.8 F (37.1 C) 98.3 F (36.8 C)  TempSrc: Oral Oral Oral Oral  SpO2: 95% 96% 97% 96%  Weight:      Height:        Body mass index is 20.95 kg/m.  General exam: Pleasant, elderly Caucasian male.  Chronically sick looking. Skin: Multiple rashes and abrasions in the body. HEENT: Atraumatic, normocephalic, no obvious bleeding.  Right facial area with wound as in the picture Lungs: Clear to auscultation bilaterally CVS: Regular rate and rhythm, no murmur GI/Abd soft, nontender, nondistended, bowel sound present CNS:  Alert, awake, able to answer questions.   Psychiatry: Frustrated because he is not able to return home yet Extremities: No edema, no calf tenderness  Follow ups:    Follow-up Information     [facility] Follow up.   Contact information: [address] [place] [address] [phone]                Discharge Instructions:   Discharge Instructions     Call MD for:  difficulty breathing, headache or visual disturbances   Complete by: As directed    Call MD for:  extreme fatigue   Complete by: As directed    Call MD for:  hives   Complete by: As directed    Call MD for:  persistant dizziness or light-headedness   Complete by: As directed    Call MD for:  persistant nausea and vomiting   Complete by: As directed    Call MD for:  severe uncontrolled pain   Complete by: As directed    Call MD for:  temperature >100.4   Complete by: As directed    Diet general   Complete by: As directed    Discharge instructions   Complete by: As directed    Recommendations at discharge:   Encourage oral hydration  Encourage use of back brace, assistive device for ambulation  Encourage regular bowel movement  Discharge instructions for diabetes mellitus: Check blood sugar 3 times a day and bedtime at home. If blood sugar running above 200 or less than 70 please call your MD to adjust insulin. If you notice signs and symptoms of hypoglycemia (low blood sugar) like jitteriness, confusion, thirst, tremor and sweating, please check blood sugar, drink sugary drink/biscuits/sweets to increase sugar level and call MD or return to ER.    General discharge instructions: Follow with  Primary MD Pcp, No in 7 days  Please request your PCP  to go over your hospital tests, procedures, radiology results at the follow up. Please get your medicines reviewed and adjusted.  Your PCP may decide to repeat certain labs or tests as  needed. Do not drive, operate heavy machinery, perform activities at heights, swimming or participation in water activities or provide baby sitting services if your were admitted for syncope or siezures until you have seen by Primary MD or a Neurologist and advised to do so again. [place] Controlled Substance Reporting System database was reviewed. Do not drive, operate heavy machinery, perform activities at heights, swim, participate in water activities or provide baby-sitting services while on medications for pain, sleep and mood until your outpatient physician has reevaluated you and advised to do so again.  You are strongly recommended to comply with the dose, frequency and duration of prescribed medications. Activity: As tolerated with Full fall precautions use walker/cane & assistance as needed Avoid using any recreational substances like cigarette, tobacco, alcohol, or non-prescribed drug. If you experience worsening of your admission symptoms, develop shortness of breath, life threatening emergency, suicidal or homicidal thoughts you must seek medical attention immediately by calling 911 or calling your MD immediately  if symptoms less severe. You must read complete instructions/literature along with all the possible adverse reactions/side effects for all the medicines you take and that have been prescribed to you. Take any new medicine only after you have completely understood and accepted all the possible adverse reactions/side effects.  Wear Seat belts while driving. You were cared for by a hospitalist during your hospital stay. If you have any questions about your discharge medications or the care you received while you were in the hospital after you are discharged, you can call the unit and ask to speak with the hospitalist or the covering physician. Once you are discharged, your primary care physician will handle any further medical issues. Please note that NO REFILLS for any discharge  medications will be authorized once you are discharged, as it is imperative that you return to your primary care physician (or establish a relationship with a primary care physician if you do not have one).   Discharge wound care:   Complete by: As directed    Increase activity slowly   Complete by: As directed        Discharge Medications:   Allergies as of 01/27/2023       Reactions   Tape Other (See Comments)   SKIN TEARS VERY EASILY!!!!! Can tolerate ONLY paper tape.   Zolpidem Other (See Comments)   Confusion   Moxifloxacin Rash, Other (See Comments)   Achilles tendon pain also        Medication List     STOP taking these medications    amLODipine 5 MG tablet Commonly known as: NORVASC   omeprazole 20 MG capsule Commonly known as: PriLOSEC Replaced by: pantoprazole 40 MG tablet       TAKE these medications    ascorbic acid 500 MG tablet Commonly known as: VITAMIN C Take 500 mg by mouth daily.   buPROPion 150 MG 12 hr tablet Commonly known as: WELLBUTRIN SR Take 1 tablet (150 mg total) by mouth daily. What changed: when to take this   CITRACAL CALCIUM+D PO Take 1 tablet by mouth daily.   famotidine 40 MG tablet Commonly known as: PEPCID Take 40 mg by mouth at bedtime.   ferrous sulfate 325 (65 FE) MG tablet  Take 1 tablet (325 mg total) by mouth daily with breakfast. Start taking on: Jan 28, 2023   Magnesium Oxide 400 MG Caps Take 400 mg by mouth daily as needed (cramps).   metoprolol tartrate 50 MG tablet Commonly known as: LOPRESSOR Take 1 tablet (50 mg total) by mouth 2 (two) times daily.   multivitamin tablet Take 1 tablet by mouth daily with breakfast.   pantoprazole 40 MG tablet Commonly known as: PROTONIX Take 1 tablet (40 mg total) by mouth daily. Start taking on: Jan 28, 2023 Replaces: omeprazole 20 MG capsule   senna-docusate 8.6-50 MG tablet Commonly known as: Senokot-S Take 1 tablet by mouth daily.   sodium bicarbonate 650  MG tablet Take 1,300 mg by mouth 2 (two) times daily.   tacrolimus 1 MG capsule Commonly known as: PROGRAF Take 1 mg by mouth in the morning and at bedtime.   tamsulosin 0.4 MG Caps capsule Commonly known as: FLOMAX Take 0.4 mg by mouth daily.   traZODone 100 MG tablet Commonly known as: DESYREL Take 100 mg by mouth at bedtime as needed for sleep.               Discharge Care Instructions  (From admission, onward)           Start     Ordered   01/27/23 0000  Discharge wound care:        01/27/23 1318             The results of significant diagnostics from this hospitalization (including imaging, microbiology, ancillary and laboratory) are listed below for reference.    Procedures and Diagnostic Studies:   CT L-SPINE NO CHARGE  Result Date: 01/22/2023 CLINICAL DATA:  Patient found down.  Last seen 3 days ago. EXAM: CT LUMBAR SPINE WITHOUT CONTRAST TECHNIQUE: Multidetector CT imaging of the lumbar spine was performed without intravenous contrast administration. Multiplanar CT image reconstructions were also generated. RADIATION DOSE REDUCTION: This exam was performed according to the departmental dose-optimization program which includes automated exposure control, adjustment of the mA and/or kV according to patient size and/or use of iterative reconstruction technique. COMPARISON:  CT of the lumbar spine 4/5/3. FINDINGS: Segmentation: 5 non rib-bearing lumbar type vertebral bodies are present. The lowest fully formed vertebral body is L5. Alignment: Slight degenerative anterolisthesis L4-5 is stable. Other significant listhesis is present. Rightward curvature is centered at L4-5. Leftward curvature is centered at L5-S1 Vertebrae: Progressive vertebral plana compression fractures present at L1. Endplate sclerotic changes are worse on the left at L4-5 and right at vertebral heights are maintained. Paraspinal and other soft tissues: Atherosclerotic calcifications are present  the aorta branch no significant adenopathy is present. The paraspinous musculature is atrophic. Disc levels: L1-2: Facet hypertrophy again noted. Disc protrusion extends into the foramina bilaterally. Mild bilateral foraminal stenosis is present. L2-3: Mild facet hypertrophy is present. No significant protrusion or stenosis is present. L3-4: Mild broad-based disc bulge is present. Facet hypertrophy is noted bilaterally. Mild foraminal narrowing is worse on the right. L4-5: Endplate sclerotic changes worse on left. Facet spurring is worse on the right. Moderate foraminal stenosis is present bilaterally. Past wrapped disease is noted. L5-S1: Asymmetric right-sided facet hypertrophy and spurring is present. A rightward disc protrusion is present. This results in moderate right foraminal stenosis. Mild left foraminal narrowing is present. IMPRESSION: 1. Progressive vertebral plana compression fracture at L1. 2. No new fractures. 3. Multilevel spondylosis of the lumbar spine as described. 4. Mild bilateral foraminal stenosis at L1-2  and L3-4 is worse on the right. 5. Moderate foraminal stenosis bilaterally at L4-5 is worse on the right. 6. Moderate right and mild left foraminal stenosis at L5-S1. 7.  Aortic Atherosclerosis (ICD10-I70.0). Electronically Signed   By: Marin Roberts M.D.   On: 01/22/2023 13:51   CT CHEST ABDOMEN PELVIS WO CONTRAST  Result Date: 01/22/2023 CLINICAL DATA:  Polytrauma, blunt.  Fall. EXAM: CT CHEST, ABDOMEN AND PELVIS WITHOUT CONTRAST TECHNIQUE: Multidetector CT imaging of the chest, abdomen and pelvis was performed following the standard protocol without IV contrast. RADIATION DOSE REDUCTION: This exam was performed according to the departmental dose-optimization program which includes automated exposure control, adjustment of the mA and/or kV according to patient size and/or use of iterative reconstruction technique. COMPARISON:  09/29/2011 abdominal CT. Thoracic and lumbar spine  CTs 12/29/2021 FINDINGS: CT CHEST FINDINGS Cardiovascular: Heart is borderline in size. Aorta normal caliber. Scattered coronary artery and aortic calcifications. Mediastinum/Nodes: No mediastinal, hilar, or axillary adenopathy. Trachea and esophagus are unremarkable. Lungs/Pleura: Linear areas of scarring in the lower lobes bilaterally. No effusions or pneumothorax Musculoskeletal: Chronic compression fractures noted at T9 and T5. Mild compression fracture through the superior endplate of T3 age-indeterminate, but new since 12/29/2021. CT ABDOMEN PELVIS FINDINGS Hepatobiliary: No hepatic injury or perihepatic hematoma. Prior cholecystectomy. Pneumobilia noted. Pancreas: No focal abnormality or ductal dilatation. Spleen: No splenic injury or perisplenic hematoma. Adrenals/Urinary Tract: No adrenal hemorrhage or renal injury identified. Bladder is unremarkable. No hydronephrosis. Stomach/Bowel: Prior hemicolectomy.  No bowel obstruction. Vascular/Lymphatic: Aortic atherosclerosis. No evidence of aneurysm or adenopathy. Reproductive: No visible focal abnormality. Other: No free fluid or free air. Musculoskeletal: Chronic compression fracture at L1, progressed significantly since 12/29/2021. Degenerative disc disease most pronounced at L4-5. Diffuse degenerative facet disease. IMPRESSION: Chronic compression fractures at T5, T9 and L1. L1 compression fracture has progressed since prior study. Age indeterminate mild compression fracture through the superior endplate of T3, new since 12/29/2021. Otherwise, no acute finding or significant traumatic injury in the chest, abdomen or pelvis. Bibasilar scarring. Coronary artery disease, aortic atherosclerosis. Electronically Signed   By: Charlett Nose M.D.   On: 01/22/2023 13:45   CT T-SPINE NO CHARGE  Result Date: 01/22/2023 CLINICAL DATA:  Fall.  Patient found down yesterday. EXAM: CT THORACIC SPINE WITHOUT CONTRAST TECHNIQUE: Multidetector CT images of the thoracic were  obtained using the standard protocol without intravenous contrast. RADIATION DOSE REDUCTION: This exam was performed according to the departmental dose-optimization program which includes automated exposure control, adjustment of the mA and/or kV according to patient size and/or use of iterative reconstruction technique. COMPARISON:  CT of the thoracic spine 12/29/2021 FINDINGS: Alignment: No significant ceases is present. Mild leftward curvature is present in the midthoracic spine. Vertebrae: Acute superior endplate fracture at T3 demonstrates 30% loss height without retropulsed bone. Remote superior endplate T5 fracture is present. Remote superior endplate T9 fracture is present. Schmorl's nodes involving the superior endplate of T8 both endplates at T8-9 in the inferior endplate of T9 have progressed. Schmorl's node the superior endplate of T11 associated remote superior endplate fracture is stable. L1 vertebral plana compression fracture has progressed Paraspinal and other soft tissues: Lungs are clear. Areas of atelectasis or scarring at the left lung base are similar to the prior study. Lungs are otherwise unremarkable. Disc levels: Significant focal disc disease or stenosis is present. IMPRESSION: 1. Acute superior endplate fracture at T3 with 30% loss height without retropulsed bone. 2. Remote superior endplate fractures at T5, T9, and T11. 3. Progressive  vertebral plana compression fracture at L1. 4. Progressive Schmorl's nodes involving the superior endplate of T8 both endplates at T8-9 in the inferior endplate of T9. 5. Mild leftward curvature of the midthoracic spine. 6. Areas of atelectasis or scarring at the left lung base are similar to the prior study. Electronically Signed   By: Marin Roberts M.D.   On: 01/22/2023 13:44   DG Shoulder Right  Result Date: 01/22/2023 CLINICAL DATA:  Fall.  Pain. EXAM: RIGHT SHOULDER - 2+ VIEW COMPARISON:  None Available. FINDINGS: There is no evidence of  fracture or dislocation. There is no evidence of arthropathy or other focal bone abnormality. Soft tissues are unremarkable. IMPRESSION: Negative right shoulder radiographs. Electronically Signed   By: Marin Roberts M.D.   On: 01/22/2023 13:37   DG Knee Complete 4 Views Left  Result Date: 01/22/2023 CLINICAL DATA:  Left knee pain.  Fall. EXAM: LEFT KNEE - COMPLETE 4+ VIEW COMPARISON:  None Available. FINDINGS: No evidence of fracture, dislocation, or joint effusion. No evidence of arthropathy or other focal bone abnormality. Soft tissues are unremarkable. IMPRESSION: Negative left knee radiographs. Electronically Signed   By: Marin Roberts M.D.   On: 01/22/2023 13:36   CT Maxillofacial Wo Contrast  Result Date: 01/22/2023 CLINICAL DATA:  Facial trauma, blunt.  Fall. EXAM: CT MAXILLOFACIAL WITHOUT CONTRAST TECHNIQUE: Multidetector CT imaging of the maxillofacial structures was performed. Multiplanar CT image reconstructions were also generated. RADIATION DOSE REDUCTION: This exam was performed according to the departmental dose-optimization program which includes automated exposure control, adjustment of the mA and/or kV according to patient size and/or use of iterative reconstruction technique. COMPARISON:  None Available. FINDINGS: Osseous: No fracture or mandibular dislocation. No destructive process. Orbits: Negative. No traumatic or inflammatory finding. Sinuses: No air-fluid levels. Soft tissues: Soft tissue swelling over the right orbit and upper face. Limited intracranial: See head CT report IMPRESSION: Soft tissue swelling over the right orbit and upper face. No facial or orbital fracture. Electronically Signed   By: Charlett Nose M.D.   On: 01/22/2023 13:28   CT Cervical Spine Wo Contrast  Result Date: 01/22/2023 CLINICAL DATA:  Neck trauma (Age >= 65y), fall EXAM: CT CERVICAL SPINE WITHOUT CONTRAST TECHNIQUE: Multidetector CT imaging of the cervical spine was performed without  intravenous contrast. Multiplanar CT image reconstructions were also generated. RADIATION DOSE REDUCTION: This exam was performed according to the departmental dose-optimization program which includes automated exposure control, adjustment of the mA and/or kV according to patient size and/or use of iterative reconstruction technique. COMPARISON:  None Available. FINDINGS: Alignment: No subluxation Skull base and vertebrae: No cervical spine fracture or focal bone lesion. There appears to be mild compression fracture through the superior endplate of T3, partially imaged on this cervical spine series. See further discussion of the thoracic spine series. Soft tissues and spinal canal: No prevertebral fluid or swelling. No visible canal hematoma. Disc levels: Diffuse degenerative disc disease with disc space narrowing and spurring. Mild degenerative facet disease bilaterally. Upper chest: No acute findings Other: None IMPRESSION: Apparent mild compression fracture through the superior endplate of T3, partially visualized on this cervical spine study. See thoracic spine study for further discussion. No acute cervical spine abnormality. Spondylosis. Electronically Signed   By: Charlett Nose M.D.   On: 01/22/2023 13:26   CT Head Wo Contrast  Result Date: 01/22/2023 CLINICAL DATA:  Head trauma, minor (Age >= 65y).  Fall. EXAM: CT HEAD WITHOUT CONTRAST TECHNIQUE: Contiguous axial images were obtained from the base  of the skull through the vertex without intravenous contrast. RADIATION DOSE REDUCTION: This exam was performed according to the departmental dose-optimization program which includes automated exposure control, adjustment of the mA and/or kV according to patient size and/or use of iterative reconstruction technique. COMPARISON:  11/05/2010 FINDINGS: Brain: There is atrophy and chronic small vessel disease changes. No acute intracranial abnormality. Specifically, no hemorrhage, hydrocephalus, mass lesion, acute  infarction, or significant intracranial injury. Vascular: No hyperdense vessel or unexpected calcification. Skull: No acute calvarial abnormality. Sinuses/Orbits: Soft tissue swelling over the right orbit. No visible fracture. Paranasal sinuses clear. Other: None IMPRESSION: Atrophy, chronic microvascular disease. No acute intracranial abnormality. Electronically Signed   By: Charlett Nose M.D.   On: 01/22/2023 13:23     Labs:   Basic Metabolic Panel: Recent Labs  Lab 01/23/23 0600 01/24/23 0136 01/25/23 0445 01/26/23 0146 01/27/23 0128  NA 142 141 137 134* 137  K 3.9 3.2* 2.6* 3.6 3.3*  CL 108 110 107 106 103  CO2 21* 22 23 23 25   GLUCOSE 77 143* 92 98 110*  BUN 52* 45* 30* 24* 20  CREATININE 3.75* 3.10* 2.21* 1.91* 2.07*  CALCIUM 8.8* 8.3* 7.8* 8.1* 8.1*  MG  --   --  1.2* 1.6* 2.3  PHOS  --   --   --  1.3* 3.4   GFR Estimated Creatinine Clearance: 29.8 mL/min (A) (by C-G formula based on SCr of 2.07 mg/dL (H)). Liver Function Tests: Recent Labs  Lab 01/22/23 1123  AST 46*  ALT 31  ALKPHOS 91  BILITOT 1.4*  PROT 7.6  ALBUMIN 3.4*   No results for input(s): "LIPASE", "AMYLASE" in the last 168 hours. Recent Labs  Lab 01/24/23 0136  AMMONIA 19   Coagulation profile Recent Labs  Lab 01/22/23 1123  INR 1.1    CBC: Recent Labs  Lab 01/22/23 1123 01/23/23 0600 01/24/23 0136 01/25/23 0445 01/26/23 0146 01/27/23 0128  WBC 6.9 5.4 4.5 4.4 3.3* 3.3*  NEUTROABS 5.2  --  3.6 3.0 2.1 2.2  HGB 14.2 12.4* 11.1* 9.8* 9.9* 9.8*  HCT 45.1 39.1 34.8* 30.0* 29.3* 29.0*  MCV 103.7* 103.2* 102.4* 101.7* 99.0 97.0  PLT 235 170 158 161 183 211   Cardiac Enzymes: Recent Labs  Lab 01/22/23 1123  CKTOTAL 208   BNP: Invalid input(s): "POCBNP" CBG: Recent Labs  Lab 01/23/23 1327 01/23/23 1410  GLUCAP 68* 73   D-Dimer No results for input(s): "DDIMER" in the last 72 hours. Hgb A1c No results for input(s): "HGBA1C" in the last 72 hours. Lipid Profile No results  for input(s): "CHOL", "HDL", "LDLCALC", "TRIG", "CHOLHDL", "LDLDIRECT" in the last 72 hours. Thyroid function studies No results for input(s): "TSH", "T4TOTAL", "T3FREE", "THYROIDAB" in the last 72 hours.  Invalid input(s): "FREET3" Anemia work up Recent Labs    01/25/23 0445  VITAMINB12 1,707*  FOLATE 11.2  FERRITIN 1,586*  TIBC 133*  IRON 13*  RETICCTPCT 1.1   Microbiology Recent Results (from the past 240 hour(s))  SARS Coronavirus 2 by RT PCR (hospital order, performed in [facility] hospital lab) *cepheid single result test* Anterior Nasal Swab     Status: None   Collection Time: 01/22/23 11:24 AM   Specimen: Anterior Nasal Swab  Result Value Ref Range Status   SARS Coronavirus 2 by RT PCR NEGATIVE NEGATIVE Final    Comment: Performed at [facility] Lab, [address] [place], [place] [address]  Blood culture (routine x 2)     Status: None  Collection Time: 01/22/23 11:24 AM   Specimen: BLOOD  Result Value Ref Range Status   Specimen Description BLOOD SITE NOT SPECIFIED  Final   Special Requests   Final    BOTTLES DRAWN AEROBIC AND ANAEROBIC Blood Culture adequate volume   Culture   Final    NO GROWTH 5 DAYS Performed at [facility] Lab, [address], [place], [place] [address]    Report Status 01/27/2023 FINAL  Final  Blood culture (routine x 2)     Status: None   Collection Time: 01/22/23 11:29 AM   Specimen: BLOOD  Result Value Ref Range Status   Specimen Description BLOOD LEFT ANTECUBITAL  Final   Special Requests   Final    BOTTLES DRAWN AEROBIC AND ANAEROBIC Blood Culture adequate volume   Culture   Final    NO GROWTH 5 DAYS Performed at [facility], [address] [place], [place] [address]    Report Status 01/27/2023 FINAL  Final    Time coordinating discharge: 45 minutes  Signed: Lorianne Malbrough  [facility] 01/27/2023, 1:18 PM

## 2023-02-07 ENCOUNTER — Emergency Department (HOSPITAL_COMMUNITY)
Admission: EM | Admit: 2023-02-07 | Discharge: 2023-02-07 | Disposition: A | Discharge status: Home / Self Care | Payer: Medicare (Managed Care) | Attending: Emergency Medicine | Admitting: Emergency Medicine

## 2023-02-07 ENCOUNTER — Other Ambulatory Visit: Payer: Self-pay

## 2023-02-07 ENCOUNTER — Emergency Department (HOSPITAL_COMMUNITY): Payer: Medicare (Managed Care)

## 2023-02-07 DIAGNOSIS — Y92009 Unspecified place in unspecified non-institutional (private) residence as the place of occurrence of the external cause: Secondary | ICD-10-CM

## 2023-02-07 DIAGNOSIS — R4182 Altered mental status, unspecified: Secondary | ICD-10-CM | POA: Insufficient documentation

## 2023-02-07 DIAGNOSIS — N189 Chronic kidney disease, unspecified: Secondary | ICD-10-CM | POA: Insufficient documentation

## 2023-02-07 DIAGNOSIS — I129 Hypertensive chronic kidney disease with stage 1 through stage 4 chronic kidney disease, or unspecified chronic kidney disease: Secondary | ICD-10-CM | POA: Diagnosis not present

## 2023-02-07 DIAGNOSIS — R251 Tremor, unspecified: Secondary | ICD-10-CM | POA: Insufficient documentation

## 2023-02-07 DIAGNOSIS — M546 Pain in thoracic spine: Secondary | ICD-10-CM | POA: Insufficient documentation

## 2023-02-07 DIAGNOSIS — R2681 Unsteadiness on feet: Secondary | ICD-10-CM | POA: Insufficient documentation

## 2023-02-07 DIAGNOSIS — M545 Low back pain, unspecified: Secondary | ICD-10-CM | POA: Insufficient documentation

## 2023-02-07 DIAGNOSIS — R296 Repeated falls: Secondary | ICD-10-CM | POA: Diagnosis not present

## 2023-02-07 DIAGNOSIS — M79601 Pain in right arm: Secondary | ICD-10-CM | POA: Insufficient documentation

## 2023-02-07 LAB — URINALYSIS, ROUTINE W REFLEX MICROSCOPIC
Bilirubin Urine: NEGATIVE
Glucose, UA: NEGATIVE mg/dL
Hgb urine dipstick: NEGATIVE
Ketones, ur: NEGATIVE mg/dL
Leukocytes,Ua: NEGATIVE
Nitrite: NEGATIVE
Protein, ur: NEGATIVE mg/dL
Specific Gravity, Urine: 1.008 (ref 1.005–1.030)
pH: 7 (ref 5.0–8.0)

## 2023-02-07 LAB — MAGNESIUM: Magnesium: 1.7 mg/dL (ref 1.7–2.4)

## 2023-02-07 LAB — CBC WITH DIFFERENTIAL/PLATELET
Abs Immature Granulocytes: 0.01 10*3/uL (ref 0.00–0.07)
Basophils Absolute: 0 10*3/uL (ref 0.0–0.1)
Basophils Relative: 0 %
Eosinophils Absolute: 0.1 10*3/uL (ref 0.0–0.5)
Eosinophils Relative: 3 %
HCT: 35.8 % — ABNORMAL LOW (ref 39.0–52.0)
Hemoglobin: 11.6 g/dL — ABNORMAL LOW (ref 13.0–17.0)
Immature Granulocytes: 0 %
Lymphocytes Relative: 16 %
Lymphs Abs: 0.8 10*3/uL (ref 0.7–4.0)
MCH: 32.9 pg (ref 26.0–34.0)
MCHC: 32.4 g/dL (ref 30.0–36.0)
MCV: 101.4 fL — ABNORMAL HIGH (ref 80.0–100.0)
Monocytes Absolute: 0.4 10*3/uL (ref 0.1–1.0)
Monocytes Relative: 8 %
Neutro Abs: 3.8 10*3/uL (ref 1.7–7.7)
Neutrophils Relative %: 73 %
Platelets: 268 10*3/uL (ref 150–400)
RBC: 3.53 MIL/uL — ABNORMAL LOW (ref 4.22–5.81)
RDW: 12.7 % (ref 11.5–15.5)
WBC: 5.2 10*3/uL (ref 4.0–10.5)
nRBC: 0 % (ref 0.0–0.2)

## 2023-02-07 LAB — COMPREHENSIVE METABOLIC PANEL
ALT: 21 U/L (ref 0–44)
AST: 31 U/L (ref 15–41)
Albumin: 3.4 g/dL — ABNORMAL LOW (ref 3.5–5.0)
Alkaline Phosphatase: 88 U/L (ref 38–126)
Anion gap: 8 (ref 5–15)
BUN: 20 mg/dL (ref 8–23)
CO2: 26 mmol/L (ref 22–32)
Calcium: 9.6 mg/dL (ref 8.9–10.3)
Chloride: 103 mmol/L (ref 98–111)
Creatinine, Ser: 3.14 mg/dL — ABNORMAL HIGH (ref 0.61–1.24)
GFR, Estimated: 21 mL/min — ABNORMAL LOW (ref >60)
Glucose, Bld: 84 mg/dL (ref 70–99)
Potassium: 4.3 mmol/L (ref 3.5–5.1)
Sodium: 137 mmol/L (ref 135–145)
Total Bilirubin: 0.8 mg/dL (ref 0.3–1.2)
Total Protein: 7.4 g/dL (ref 6.5–8.1)

## 2023-02-07 LAB — RAPID URINE DRUG SCREEN, HOSP PERFORMED
Amphetamines: NOT DETECTED
Barbiturates: NOT DETECTED
Benzodiazepines: NOT DETECTED
Cocaine: NOT DETECTED
Opiates: NOT DETECTED
Tetrahydrocannabinol: NOT DETECTED

## 2023-02-07 LAB — ETHANOL: Alcohol, Ethyl (B): <10 mg/dL (ref <10)

## 2023-02-07 LAB — CK: Total CK: 168 U/L (ref 49–397)

## 2023-02-07 MED ORDER — LACTATED RINGERS IV BOLUS
1000.0000 mL | Freq: Once | INTRAVENOUS | Status: AC
  Administered 2023-02-07: 1000 mL via INTRAVENOUS

## 2023-02-07 NOTE — ED Provider Notes (Signed)
I provided a substantive portion of the care of this patient.  I personally made/approved the management plan for this patient and take responsibility for the patient management.  EKG Interpretation  Date/Time:  Monday February 07 2023 13:08:65 EDT Ventricular Rate:  76 PR Interval:    QRS Duration: 99 QT Interval:  416 QTC Calculation: 468 R Axis:   31 Text Interpretation: Confirmed by Lorre Nick ([id number]) on 02/07/2023 5:44:39 PM   Patient here due to having trouble ambulating.  Patient has a wheelchair and walker at home.  He was seen by PT who recommended further treatment in the facility.  Patient is quiet this time.  He understands that he has an increased risk of fall at home and he accepts risk.  He has capacity at this time.  Will discharge   Lorre Nick, MD 02/07/23 1749

## 2023-02-07 NOTE — ED Provider Notes (Signed)
[place] EMERGENCY DEPARTMENT AT [facility] Provider Note   CSN: [id number] Arrival date & time: 02/07/23  1013     History  Chief Complaint  Patient presents with   Alcohol Intoxication   Fall    [name] is a 72 y.o. male with past medical history significant for liver transplant x2 (2004 and 2012), relapsed to alcohol abuse in 2022, hypertension, CKD brought to the ED by EMS from home due to "excessive ETOH use and frequent falls" per his girlfriend.  Patient arrives in c-collar placed by EMS.  Patient is complaining of right arm pain and back pain.  He states he was in the bathroom when he got tripped up by the dog and he fell.  He is unable to recall if he struck his head, but he is able to recall all of the events surrounding the fall.  Denies drug or alcohol use, states he last consumed alcohol a couple of weeks ago.        Home Medications Prior to Admission medications   Medication Sig Start Date End Date Taking? Authorizing Provider  Ascorbic Acid (VITAMIN C) 500 MG tablet Take 500 mg by mouth daily.      [provider]  buPROPion (WELLBUTRIN SR) 150 MG 12 hr tablet Take 1 tablet (150 mg total) by mouth daily. 01/27/23   Lorin Glass, MD  Calcium-Magnesium-Vitamin D (CITRACAL CALCIUM+D PO) Take 1 tablet by mouth daily.    [provider]  famotidine (PEPCID) 40 MG tablet Take 40 mg by mouth at bedtime.    [provider]  ferrous sulfate 325 (65 FE) MG tablet Take 1 tablet (325 mg total) by mouth daily with breakfast. 01/28/23 04/28/23  Lorin Glass, MD  Magnesium Oxide 400 MG CAPS Take 400 mg by mouth daily as needed (cramps).    [provider]  metoprolol (LOPRESSOR) 50 MG tablet Take 1 tablet (50 mg total) by mouth 2 (two) times daily. 05/04/11   Newt Lukes, MD  Multiple Vitamin (MULTIVITAMIN) tablet Take 1 tablet by mouth daily with breakfast.    [provider]  pantoprazole (PROTONIX) 40 MG  tablet Take 1 tablet (40 mg total) by mouth daily. 01/28/23 04/28/23  Lorin Glass, MD  senna-docusate (SENOKOT-S) 8.6-50 MG tablet Take 1 tablet by mouth daily. 01/27/23 04/27/23  Lorin Glass, MD  sodium bicarbonate 650 MG tablet Take 1,300 mg by mouth 2 (two) times daily.     [provider]  tacrolimus (PROGRAF) 1 MG capsule Take 1 mg by mouth in the morning and at bedtime.    [provider]  tamsulosin (FLOMAX) 0.4 MG CAPS capsule Take 0.4 mg by mouth daily.    [provider]  traZODone (DESYREL) 100 MG tablet Take 100 mg by mouth at bedtime as needed for sleep.    [provider]      Allergies    Tape, Zolpidem, and Moxifloxacin    Review of Systems   Review of Systems  Gastrointestinal:  Negative for abdominal pain, nausea and vomiting.  Musculoskeletal:  Positive for arthralgias (right arm pain) and back pain.  Neurological:  Negative for dizziness, syncope and light-headedness.    Physical Exam Updated Vital Signs BP (!) 152/89   Pulse 77   Temp (!) 97.5 F (36.4 C) (Oral)   Resp 16   SpO2 97%  Physical Exam Vitals and nursing note reviewed.  Constitutional:      General: He is not in acute distress.  Appearance: Normal appearance. He is not ill-appearing or diaphoretic.  HENT:     Head: Normocephalic and atraumatic.  Cardiovascular:     Rate and Rhythm: Normal rate and regular rhythm.  Pulmonary:     Effort: Pulmonary effort is normal.  Abdominal:     General: Abdomen is flat.     Palpations: Abdomen is soft.     Tenderness: There is no abdominal tenderness.  Musculoskeletal:     Cervical back: Full passive range of motion without pain. No spinous process tenderness or muscular tenderness.     Thoracic back: Tenderness present.     Lumbar back: Tenderness present.     Comments: General tenderness to thoracic and lumbar spine without obvious deformities or injuries.  No obvious gross deformity or injury to patient's right  arm.    Neurological:     Mental Status: He is alert and oriented to person, place, and time. Mental status is at baseline.     Motor: Tremor present.     Comments: Unsteady gait when ambulated.  Patient requires assistance x2 and looks at his feet while walking.  4/5 strength in bilateral lower extremities.  5/5 strength in bilateral upper extremities in major muscle groups.  Patient does have bilateral upper extremity tremors.    Psychiatric:        Mood and Affect: Mood normal.        Behavior: Behavior normal.     ED Results / Procedures / Treatments   Labs (all labs ordered are listed, but only abnormal results are displayed) Labs Reviewed  COMPREHENSIVE METABOLIC PANEL - Abnormal; Notable for the following components:      Result Value   Creatinine, Ser 3.14 (*)    Albumin 3.4 (*)    GFR, Estimated 21 (*)    All other components within normal limits  CBC WITH DIFFERENTIAL/PLATELET - Abnormal; Notable for the following components:   RBC 3.53 (*)    Hemoglobin 11.6 (*)    HCT 35.8 (*)    MCV 101.4 (*)    All other components within normal limits  ETHANOL  MAGNESIUM  CK  URINALYSIS, ROUTINE W REFLEX MICROSCOPIC  RAPID URINE DRUG SCREEN, HOSP PERFORMED    EKG None  Radiology CT Head Wo Contrast  Result Date: 02/07/2023 CLINICAL DATA:  72 year old male with head and neck injury from recent falls. EXAM: CT HEAD WITHOUT CONTRAST CT CERVICAL SPINE WITHOUT CONTRAST TECHNIQUE: Multidetector CT imaging of the head and cervical spine was performed following the standard protocol without intravenous contrast. Multiplanar CT image reconstructions of the cervical spine were also generated. RADIATION DOSE REDUCTION: This exam was performed according to the departmental dose-optimization program which includes automated exposure control, adjustment of the mA and/or kV according to patient size and/or use of iterative reconstruction technique. COMPARISON:  01/22/2023 and prior CTs  FINDINGS: CT HEAD FINDINGS Brain: No evidence of acute infarction, hemorrhage, hydrocephalus, extra-axial collection or mass lesion/mass effect. Atrophy and chronic small-vessel white matter ischemic changes again noted. Vascular: No hyperdense vessel or unexpected calcification. Skull: Normal. Negative for fracture or focal lesion. Sinuses/Orbits: No acute finding. Other: None. CT CERVICAL SPINE FINDINGS Alignment: Unchanged with minimal spondylolisthesis at C2-3 and C3-4. No acute subluxation identified. Skull base and vertebrae: No acute fracture. No primary bone lesion or focal pathologic process. Soft tissues and spinal canal: No prevertebral fluid or swelling. No visible canal hematoma. Disc levels: Mild multilevel degenerative disc disease and spondylosis noted contributing to mild to moderate central spinal and  biforaminal narrowing at C3-4 and C4-5, as well as RIGHT foraminal narrowing at C6-7. Upper chest: No acute abnormality Other: None IMPRESSION: 1. No evidence of acute intracranial abnormality. Atrophy and chronic small-vessel white matter ischemic changes. 2. No evidence of acute injury to the cervical spine. Degenerative changes as described. Electronically Signed   By: Harmon Pier M.D.   On: 02/07/2023 11:34   CT Cervical Spine Wo Contrast  Result Date: 02/07/2023 CLINICAL DATA:  72 year old male with head and neck injury from recent falls. EXAM: CT HEAD WITHOUT CONTRAST CT CERVICAL SPINE WITHOUT CONTRAST TECHNIQUE: Multidetector CT imaging of the head and cervical spine was performed following the standard protocol without intravenous contrast. Multiplanar CT image reconstructions of the cervical spine were also generated. RADIATION DOSE REDUCTION: This exam was performed according to the departmental dose-optimization program which includes automated exposure control, adjustment of the mA and/or kV according to patient size and/or use of iterative reconstruction technique. COMPARISON:   01/22/2023 and prior CTs FINDINGS: CT HEAD FINDINGS Brain: No evidence of acute infarction, hemorrhage, hydrocephalus, extra-axial collection or mass lesion/mass effect. Atrophy and chronic small-vessel white matter ischemic changes again noted. Vascular: No hyperdense vessel or unexpected calcification. Skull: Normal. Negative for fracture or focal lesion. Sinuses/Orbits: No acute finding. Other: None. CT CERVICAL SPINE FINDINGS Alignment: Unchanged with minimal spondylolisthesis at C2-3 and C3-4. No acute subluxation identified. Skull base and vertebrae: No acute fracture. No primary bone lesion or focal pathologic process. Soft tissues and spinal canal: No prevertebral fluid or swelling. No visible canal hematoma. Disc levels: Mild multilevel degenerative disc disease and spondylosis noted contributing to mild to moderate central spinal and biforaminal narrowing at C3-4 and C4-5, as well as RIGHT foraminal narrowing at C6-7. Upper chest: No acute abnormality Other: None IMPRESSION: 1. No evidence of acute intracranial abnormality. Atrophy and chronic small-vessel white matter ischemic changes. 2. No evidence of acute injury to the cervical spine. Degenerative changes as described. Electronically Signed   By: Harmon Pier M.D.   On: 02/07/2023 11:34   DG Forearm Right  Result Date: 02/07/2023 CLINICAL DATA:  Right arm pain following a fall last night. EXAM: RIGHT FOREARM - 2 VIEW COMPARISON:  None Available. FINDINGS: Normal-appearing forearm without fracture or dislocation. Marked 1st metacarpal/carpal degenerative changes. IMPRESSION: No fracture. Marked 1st metacarpal/carpal degenerative changes. Electronically Signed   By: Beckie Salts M.D.   On: 02/07/2023 10:59   DG Humerus Right  Result Date: 02/07/2023 CLINICAL DATA:  Right arm pain following a fall last night. EXAM: RIGHT HUMERUS - 2+ VIEW COMPARISON:  Right shoulder radiographs dated 01/22/2023. FINDINGS: There is no evidence of fracture or other  focal bone lesions. Soft tissues are unremarkable. IMPRESSION: Negative. Electronically Signed   By: Beckie Salts M.D.   On: 02/07/2023 10:57    Procedures Procedures    Medications Ordered in ED Medications  lactated ringers bolus 1,000 mL (0 mLs Intravenous Stopped 02/07/23 1240)  lactated ringers bolus 1,000 mL (1,000 mLs Intravenous New Bag/Given 02/07/23 1516)    ED Course/ Medical Decision Making/ A&P Clinical Course as of 02/07/23 1516  Mon Feb 07, 2023  1500 Frequent falls, another fall today, AUD, hasn't used in a few weeks. Very unsteady on his feet. Unsteady gait. TOC consult, PT OT consult out. Will remain here as boarder until they find placement. [CG]  1514 Has home health established, ambulated without issue. If he is steady on his feet with his walker, he can go home.  [CG]  Clinical Course User Index [CG] Al Decant, PA-C                             Medical Decision Making Amount and/or Complexity of Data Reviewed Labs: ordered. Radiology: ordered.   This patient presents to the ED with chief complaint(s) of frequent falls, EtOH abuse with pertinent past medical history of alcohol abuse, liver transplant, HTN.  The complaint involves an extensive differential diagnosis and also carries with it a high risk of complications and morbidity.    The differential diagnosis includes overdose, AMS secondary to toxic ingestion, intracranial injury, hypoglycemia, metabolic derangement.  This list is not exhaustive.     The initial plan is to obtain CT imaging head/c-spine and x-ray of right arm due to patient's AMS, will obtain baseline labs also and give IV fluids  Additional history obtained: Additional history obtained from EMS  who report girlfriend stated patient has "excessive ETOH use and frequent falls".  Patient's girlfriend was unreliable historian per EMS as she appeared altered.  EMS reports patient is altered.  He was placed in c-collar. Records  reviewed previous admission documents - patient recently admitted and discharged for AKI after prolonged downtime following a fall at home.  Patient was found to have fracture at base of 3rd metacarpal bone and compression fractures at T5, T9, L1 (probably old).    Initial Assessment:   On exam, patient is lying supine with c-collar on.  He has tremors in his upper extremities.  No obvious deformities to extremities.  He does have bruising along extremities in different healing stages.  Head is normocephalic, atraumatic.    Independent ECG/labs interpretation:  The following labs were independently interpreted:  CBC with anemia, appears improved from prior.  No leukocytosis.    Independent visualization and interpretation of imaging: I independently visualized the following imaging with scope of interpretation limited to determining acute life threatening conditions related to emergency care: CT head and cervical spine, which revealed no acute intracranial injury, skull fracture, cervical spine fracture or subluxation.  Right arm x-rays without evidence of acute fracture.   Treatment and Reassessment: Patient's c-collar was removed and he was allowed to sit up.  Will have patient walk to assess gait and stability.    Patient was ambulated with 2 staff assisting him.  He was unable to keep his head up and was very unsteady on his feet without assistance.  Patient unable to walk unassisted.  Will consult TOC and have PT/OT evaluate patient.    Disposition:   Patient placed in TOC boarder status until patient is evaluated.  Appreciate TOC team and PT/OT for their evaluations to determine best disposition for patient as he is a fall risk at home.    Patient care transitioned to Jannifer Hick, PA-C at end of shift.  Plan at time of hand-off is to ambulate patient with a walker.  If patient is able to ambulate with walker, he is appropriate for discharge home.  Informed by Karoline Caldwell, RN Case Manager  that patient has home health established at home and is not financially able to be placed in SNF.    Social Determinants of Health:   Patient's  alcohol abuse   increases the complexity of managing their presentation         Final Clinical Impression(s) / ED Diagnoses Final diagnoses:  Fall in home, initial encounter  Unsteady gait when walking  Frequent falls  Rx / DC Orders ED Discharge Orders     None         Lenard Simmer, PA-C 02/07/23 1516    Mardene Sayer, MD 02/08/23 1930

## 2023-02-07 NOTE — ED Notes (Addendum)
Pt amputated to bathroom.

## 2023-02-07 NOTE — ED Notes (Addendum)
Pt ambulated with walker. Pt very unstable, almost fell a few time. Pt was running into chairs in the hallway with his walker.

## 2023-02-07 NOTE — Discharge Instructions (Addendum)
Thank you for allowing me to be a part of your care today.   Your imaging did not show any new fractures or injuries.  I recommend increasing your water intake throughout the day to help with hydration.    Abstain from alcohol consumption as this increases your risk for falls.  Always walk using your walker to help reduce chance of falls.   Return to the ED if you develop sudden worsening of your symptoms or if you have any new concerns or falls.

## 2023-02-07 NOTE — ED Notes (Signed)
Pt hits call light a few times and states "that wasn't me,that was him" and points at empty chair.

## 2023-02-07 NOTE — Care Management (Addendum)
Transition of Care [facility]) - Emergency Department Mini Assessment   Patient Details  Name: [name] MRN: [id number] Date of Birth: 1951/06/17  Transition of Care [facility]) CM/SW Contact:    [name], RN Phone Number: 02/07/2023, 2:32 PM   Clinical Narrative: Received TOC consult for [facility] services. Per chart review on last [facility] visit 01/27/23 patient has home health services in place w/ [facility] and DME w/[facility] (3N1, RW). On last visit patient was faxed out to SNF and No SNF in [place] will accept patient due to insurance.    This RNCM notified Amy w/[facility] to confirm they are following patient for [facility] services.   TOC will continue to follow.  -2:54pm This RNCM spoke w/patient at bedside who reports he does have HH services and has been doing fine at home. Patient reports he lives w/ his girlfriend and she wanted him to come to the hospital. Patient reports he is unable to pay out of pocket for short term SNF due to no SNF in [place] will accept his insurance.  While this RNCM in room speaking with patient, [facility] PT came in to evaluate patient. Notified RN and EDP  No additional TOC needs.  - 3:12pm This RNCM notified EDP that other option is private duty nursing which patient will have to pay out of pocket. This RNCM received confirmation from Amy w/ [facility] that patient is active for HHPT/OT.  No additional TOC needs, re-consult for immediate TOC needs. .   ED Mini Assessment: What brought you to the Emergency Department? : frequent falls, excessive ETOH  Barriers to Discharge: Continued Medical Work up  [employer] interventions: home health services     Interventions which prevented an admission or readmission: Home Health Consult or Services    Patient Contact and Communications        ,          Patient states their goals for this hospitalization and ongoing recovery are:: return home CMS Medicare.gov Compare Post Acute Care list provided to:: Patient Choice offered to /  list presented to : Patient  Admission diagnosis:  etoh fall Patient Active Problem List   Diagnosis Date Noted   CKD (chronic kidney disease) stage 3, GFR 30-59 ml/min (HCC) 01/22/2023   AKI (acute kidney injury) (HCC) 01/22/2023   SVT (supraventricular tachycardia) 01/22/2023   Nondisplaced fracture of base of third metacarpal bone, right hand, initial encounter for closed fracture 01/22/2023   Spinal stenosis of lumbar region at multiple levels 01/22/2023   Hypertension 05/04/2011   History of liver transplant (HCC) 01/11/2011   ANEMIA, IRON DEFICIENCY 10/16/2010   DERMATITIS 10/16/2010   COLONIC POLYPS, HX OF 10/16/2010   THROMBOCYTOPENIA 10/14/2010   ALCOHOL ABUSE, HX OF 10/14/2010   PCP:  Oneita Hurt, No Pharmacy:   [facility] [address], [place] - [address] [place] [address] Phone: [phone] Fax: [phone]

## 2023-02-07 NOTE — ED Notes (Signed)
Pt stated that he can not give [place] a urine sample at this time. PA was notified.

## 2023-02-07 NOTE — ED Provider Notes (Signed)
Physical Exam  BP (!) 129/96   Pulse 76   Temp (!) 97.5 F (36.4 C) (Oral)   Resp 16   SpO2 96%   Physical Exam Vitals and nursing note reviewed.  HENT:     Head: Normocephalic and atraumatic.     Nose: Nose normal.     Mouth/Throat:     Mouth: Mucous membranes are moist.     Pharynx: Oropharynx is clear.  Eyes:     Extraocular Movements: Extraocular movements intact.     Conjunctiva/sclera: Conjunctivae normal.     Pupils: Pupils are equal, round, and reactive to light.  Cardiovascular:     Rate and Rhythm: Normal rate and regular rhythm.  Pulmonary:     Effort: Pulmonary effort is normal.     Breath sounds: Normal breath sounds. No wheezing.  Abdominal:     General: Abdomen is flat. Bowel sounds are normal.     Palpations: Abdomen is soft.     Tenderness: There is no abdominal tenderness.  Musculoskeletal:     Cervical back: Normal range of motion and neck supple. No tenderness.  Skin:    General: Skin is warm and dry.     Capillary Refill: Capillary refill takes less than 2 seconds.  Neurological:     Mental Status: He is alert and oriented to person, place, and time.     Comments: Patient alert and oriented to time, place, situation, year. He is able to tell me his address. He can tell me why he is here.      Procedures  Procedures  ED Course / MDM      Medical Decision Making Amount and/or Complexity of Data Reviewed Labs: ordered. Radiology: ordered.   Patient signed out to me at shift change pending PT/OT consult. Previous provider plan for this patient was to discharge him home if he was ambulated and showed steady gait. Please see previous provider note for further details.  In short this is a 72 year old male with past medical history of alcohol use who presents to the ED due to frequent falls.  The patient was signed out to me pending reassessment of ambulation, PT consult.  Patient was ambulated with nursing staff and was able to maintain steady  gait while ambulating.  The patient was evaluated by PT and in PTs note they wrote that the patient was alert only to self.  On my evaluation the patient is completely alert and oriented.  He can tell me the time, place, situation, year.  He can tell me his home address.  He can tell me why he is in the hospital.  The patient is requesting discharge.  He states that he has home health aides that come to his home, walker at home, wheelchair at home. This was confirmed to me by the previous provider.  He denies any alcohol use and his alcohol testing done here today is negative.  I had my attending, Dr. Freida Busman, come and evaluate the patient. Dr. Freida Busman feels as though this patient is stable to discharge home. Dr. Freida Busman discussed risks and benefits of being discharged but at this time the patient continues to request discharge home. He is alert and oriented x4. He denies any further concerns at this time. He understands that he could go home, fall again and further injure himself but he accepts these responsibilities.   Patient will be discharged home at this time.       Al Decant, PA-C 02/07/23 1756  Lorre Nick, MD 02/08/23 [phone]

## 2023-02-07 NOTE — Evaluation (Signed)
Physical Therapy Evaluation Patient Details Name: [name] MRN: [id number] DOB: Dec 15, 1950 Today's Date: 02/07/2023  History of Present Illness  72 y.o. male with brought to the ED by EMS from home due to "excessive ETOH use and frequent falls" per his girlfriend. pt c/o neck and R UE pain. Patient arrives in c-collar placed by EMS. imaging C spine and R arm negative; PMH: liver transplant x2 , relapsed to alcohol abuse in 2022, hypertension, CKD, fx 3rd metacarpal, spinal stenosis  Clinical Impression  Pt admitted with above diagnosis.  Pt oriented to self only a time of PT eval, presents with generalized weakness and deconditioning. Requires assist to come to sit and take lateral steps along EOB. Pt is a significant fall risk and declines to attempt further amb at this time stating he is "weak and cold" Will follow in acute setting.  Pt currently with functional limitations due to the deficits listed below (see PT Problem List). Pt will benefit from acute skilled PT to increase their independence and safety with mobility to allow discharge.          Recommendations for follow up therapy are one component of a multi-disciplinary discharge planning process, led by the attending physician.  Recommendations may be updated based on patient status, additional functional criteria and insurance authorization.  Follow Up Recommendations Can patient physically be transported by private vehicle: No     Assistance Recommended at Discharge Frequent or constant Supervision/Assistance  Patient can return home with the following       Equipment Recommendations None recommended by PT (?has DME)  Recommendations for Other Services       Functional Status Assessment Patient has had a recent decline in their functional status and demonstrates the ability to make significant improvements in function in a reasonable and predictable amount of time.     Precautions / Restrictions  Precautions Precautions: Fall;Back Restrictions Weight Bearing Restrictions: No      Mobility  Bed Mobility Overal bed mobility: Needs Assistance   Rolling: Supervision Sidelying to sit: Min guard     Sit to sidelying: Min assist General bed mobility comments: incr time to come to sitting, assist to fully elevate legs on return to supine    Transfers Overall transfer level: Needs assistance   Transfers: Sit to/from Stand Sit to Stand: Min assist           General transfer comment: STS x2 assist to rise and stabilize; incr potential sway in standing.    Ambulation/Gait Ambulation/Gait assistance: Min assist   Assistive device: 1 person hand held assist         General Gait Details: lateral steps along EOB, assist throughout to steady  Stairs            Wheelchair Mobility    Modified Rankin (Stroke Patients Only)       Balance Overall balance assessment: History of Falls Sitting-balance support: Feet supported       Standing balance support: During functional activity, Reliant on assistive device for balance Standing balance-Leahy Scale: Poor Standing balance comment: wide BOS and external assist to maintain standing                             Pertinent Vitals/Pain Pain Assessment Pain Assessment: Faces Faces Pain Scale: Hurts a little bit Pain Location: non-specific Pain Descriptors / Indicators: Sore, Grimacing    Home Living Family/patient expects to be discharged to:: Private residence Living Arrangements:  Spouse/significant other Available Help at Discharge: Family Type of Home: House Home Access: Stairs to enter   Entrance Stairs-Number of Steps: 15 Alternate Level Stairs-Number of Steps: 14+ Home Layout: Two level;Able to live on main level with bedroom/bathroom Home Equipment: None Additional Comments: pt is not a reliable historian, some of above info taken from previous admissions which is also possibly  unreliable    Prior Function Prior Level of Function : Patient poor historian/Family not available             Mobility Comments: reports independence       Hand Dominance        Extremity/Trunk Assessment   Upper Extremity Assessment Upper Extremity Assessment: Generalized weakness    Lower Extremity Assessment Lower Extremity Assessment: Generalized weakness       Communication   Communication: No difficulties  Cognition Arousal/Alertness: Awake/alert Behavior During Therapy: WFL for tasks assessed/performed Overall Cognitive Status: Impaired/Different from baseline                   Orientation Level: Disoriented to, Place, Time, Situation Current Attention Level: Focused   Following Commands: Follows one step commands inconsistently, Follows one step commands with increased time, Follows multi-step commands inconsistently Safety/Judgement: Decreased awareness of safety, Decreased awareness of deficits   Problem Solving: Slow processing, Decreased initiation General Comments: pt oriented to self only; pt states it is April and he does not know where he is; pt tangential and has difficulty answering basic questions        General Comments      Exercises     Assessment/Plan    PT Assessment Patient needs continued PT services  PT Problem List Decreased strength;Decreased range of motion;Decreased activity tolerance;Decreased balance;Decreased mobility;Decreased coordination;Decreased cognition;Decreased knowledge of use of DME;Decreased safety awareness;Decreased knowledge of precautions;Decreased skin integrity       PT Treatment Interventions DME instruction;Gait training;Functional mobility training;Therapeutic activities;Therapeutic exercise;Balance training;Patient/family education    PT Goals (Current goals can be found in the Care Plan section)  Acute Rehab PT Goals Patient Stated Goal: Did not state, but agreeable to stand at EOB PT Goal  Formulation: Patient unable to participate in goal setting Time For Goal Achievement: 02/21/23 Potential to Achieve Goals: Fair    Frequency Min 1X/week     Co-evaluation               AM-PAC PT "6 Clicks" Mobility  Outcome Measure Help needed turning from your back to your side while in a flat bed without using bedrails?: A Little Help needed moving from lying on your back to sitting on the side of a flat bed without using bedrails?: A Little Help needed moving to and from a bed to a chair (including a wheelchair)?: A Little Help needed standing up from a chair using your arms (e.g., wheelchair or bedside chair)?: A Little Help needed to walk in hospital room?: A Lot Help needed climbing 3-5 steps with a railing? : Total 6 Click Score: 15    End of Session Equipment Utilized During Treatment: Gait belt Activity Tolerance: Patient limited by fatigue Patient left: in bed;with call bell/phone within reach;with bed alarm set   PT Visit Diagnosis: Unsteadiness on feet (R26.81);Other abnormalities of gait and mobility (R26.89);History of falling (Z91.81);Muscle weakness (generalized) (M62.81);Other symptoms and signs involving the nervous system (R29.898)    Time: 1610-9604 PT Time Calculation (min) (ACUTE ONLY): 17 min   Charges:   PT Evaluation $PT Eval Low Complexity: 1 Low  Delice Bison, PT  Acute Rehab Dept [facility]) [phone]  02/07/2023   Drucilla Chalet 02/07/2023, 4:14 PM

## 2023-02-07 NOTE — ED Triage Notes (Addendum)
EMS reports from home, excessive ETOH use and frequent falls per girlfriend. Pt arrived in c-collar due to GF states he fell in bathroom last night. Unreliable Hx to due girlfriend appeared altered as well. Pt appears altered on arrival. Np obvious injuries. Pt c/o right arm pain.  BP 160/80 HR 70 RR 18 Sp02 99 RA CBG 118

## 2023-02-12 ENCOUNTER — Emergency Department (HOSPITAL_COMMUNITY): Payer: Medicare (Managed Care)

## 2023-02-12 ENCOUNTER — Encounter (HOSPITAL_COMMUNITY): Payer: Self-pay | Admitting: *Deleted

## 2023-02-12 ENCOUNTER — Inpatient Hospital Stay (HOSPITAL_COMMUNITY)
Admission: EM | Admit: 2023-02-12 | Discharge: 2023-02-14 | DRG: 683 | Disposition: A | Discharge status: Home Health | Payer: Medicare (Managed Care) | Attending: Internal Medicine | Admitting: Internal Medicine

## 2023-02-12 ENCOUNTER — Other Ambulatory Visit: Payer: Self-pay

## 2023-02-12 DIAGNOSIS — M4802 Spinal stenosis, cervical region: Secondary | ICD-10-CM | POA: Diagnosis present

## 2023-02-12 DIAGNOSIS — D509 Iron deficiency anemia, unspecified: Secondary | ICD-10-CM | POA: Diagnosis present

## 2023-02-12 DIAGNOSIS — I959 Hypotension, unspecified: Secondary | ICD-10-CM | POA: Diagnosis not present

## 2023-02-12 DIAGNOSIS — N179 Acute kidney failure, unspecified: Secondary | ICD-10-CM | POA: Diagnosis not present

## 2023-02-12 DIAGNOSIS — F32A Depression, unspecified: Secondary | ICD-10-CM | POA: Diagnosis present

## 2023-02-12 DIAGNOSIS — F1021 Alcohol dependence, in remission: Secondary | ICD-10-CM

## 2023-02-12 DIAGNOSIS — F10920 Alcohol use, unspecified with intoxication, uncomplicated: Secondary | ICD-10-CM

## 2023-02-12 DIAGNOSIS — Z91048 Other nonmedicinal substance allergy status: Secondary | ICD-10-CM

## 2023-02-12 DIAGNOSIS — I129 Hypertensive chronic kidney disease with stage 1 through stage 4 chronic kidney disease, or unspecified chronic kidney disease: Secondary | ICD-10-CM | POA: Diagnosis present

## 2023-02-12 DIAGNOSIS — Z944 Liver transplant status: Secondary | ICD-10-CM | POA: Diagnosis not present

## 2023-02-12 DIAGNOSIS — I10 Essential (primary) hypertension: Secondary | ICD-10-CM | POA: Diagnosis present

## 2023-02-12 DIAGNOSIS — N1832 Chronic kidney disease, stage 3b: Secondary | ICD-10-CM | POA: Diagnosis present

## 2023-02-12 DIAGNOSIS — N183 Chronic kidney disease, stage 3 unspecified: Secondary | ICD-10-CM | POA: Diagnosis present

## 2023-02-12 DIAGNOSIS — Z85828 Personal history of other malignant neoplasm of skin: Secondary | ICD-10-CM

## 2023-02-12 DIAGNOSIS — E869 Volume depletion, unspecified: Secondary | ICD-10-CM | POA: Diagnosis present

## 2023-02-12 DIAGNOSIS — Y906 Blood alcohol level of 120-199 mg/100 ml: Secondary | ICD-10-CM | POA: Diagnosis present

## 2023-02-12 DIAGNOSIS — R531 Weakness: Secondary | ICD-10-CM | POA: Diagnosis not present

## 2023-02-12 DIAGNOSIS — E872 Acidosis, unspecified: Secondary | ICD-10-CM | POA: Diagnosis present

## 2023-02-12 DIAGNOSIS — F10220 Alcohol dependence with intoxication, uncomplicated: Secondary | ICD-10-CM | POA: Diagnosis present

## 2023-02-12 DIAGNOSIS — W19XXXA Unspecified fall, initial encounter: Secondary | ICD-10-CM | POA: Diagnosis present

## 2023-02-12 DIAGNOSIS — Z79899 Other long term (current) drug therapy: Secondary | ICD-10-CM

## 2023-02-12 DIAGNOSIS — Z888 Allergy status to other drugs, medicaments and biological substances status: Secondary | ICD-10-CM

## 2023-02-12 DIAGNOSIS — Z881 Allergy status to other antibiotic agents status: Secondary | ICD-10-CM

## 2023-02-12 DIAGNOSIS — Z8249 Family history of ischemic heart disease and other diseases of the circulatory system: Secondary | ICD-10-CM

## 2023-02-12 DIAGNOSIS — K219 Gastro-esophageal reflux disease without esophagitis: Secondary | ICD-10-CM | POA: Diagnosis present

## 2023-02-12 DIAGNOSIS — M810 Age-related osteoporosis without current pathological fracture: Secondary | ICD-10-CM | POA: Diagnosis present

## 2023-02-12 LAB — CBC WITH DIFFERENTIAL/PLATELET
Abs Immature Granulocytes: 0.02 10*3/uL (ref 0.00–0.07)
Basophils Absolute: 0 10*3/uL (ref 0.0–0.1)
Basophils Relative: 0 %
Eosinophils Absolute: 0.1 10*3/uL (ref 0.0–0.5)
Eosinophils Relative: 2 %
HCT: 30.7 % — ABNORMAL LOW (ref 39.0–52.0)
Hemoglobin: 10.1 g/dL — ABNORMAL LOW (ref 13.0–17.0)
Immature Granulocytes: 1 %
Lymphocytes Relative: 26 %
Lymphs Abs: 1 10*3/uL (ref 0.7–4.0)
MCH: 33.2 pg (ref 26.0–34.0)
MCHC: 32.9 g/dL (ref 30.0–36.0)
MCV: 101 fL — ABNORMAL HIGH (ref 80.0–100.0)
Monocytes Absolute: 0.5 10*3/uL (ref 0.1–1.0)
Monocytes Relative: 13 %
Neutro Abs: 2.1 10*3/uL (ref 1.7–7.7)
Neutrophils Relative %: 58 %
Platelets: 172 10*3/uL (ref 150–400)
RBC: 3.04 MIL/uL — ABNORMAL LOW (ref 4.22–5.81)
RDW: 12.9 % (ref 11.5–15.5)
WBC: 3.7 10*3/uL — ABNORMAL LOW (ref 4.0–10.5)
nRBC: 0 % (ref 0.0–0.2)

## 2023-02-12 LAB — COMPREHENSIVE METABOLIC PANEL
ALT: 14 U/L (ref 0–44)
AST: 17 U/L (ref 15–41)
Albumin: 2.6 g/dL — ABNORMAL LOW (ref 3.5–5.0)
Alkaline Phosphatase: 60 U/L (ref 38–126)
Anion gap: 13 (ref 5–15)
BUN: 19 mg/dL (ref 8–23)
CO2: 21 mmol/L — ABNORMAL LOW (ref 22–32)
Calcium: 8.7 mg/dL — ABNORMAL LOW (ref 8.9–10.3)
Chloride: 103 mmol/L (ref 98–111)
Creatinine, Ser: 4.63 mg/dL — ABNORMAL HIGH (ref 0.61–1.24)
GFR, Estimated: 13 mL/min — ABNORMAL LOW (ref >60)
Glucose, Bld: 89 mg/dL (ref 70–99)
Potassium: 3.5 mmol/L (ref 3.5–5.1)
Sodium: 137 mmol/L (ref 135–145)
Total Bilirubin: 0.6 mg/dL (ref 0.3–1.2)
Total Protein: 5.6 g/dL — ABNORMAL LOW (ref 6.5–8.1)

## 2023-02-12 LAB — URINALYSIS, W/ REFLEX TO CULTURE (INFECTION SUSPECTED)
Bacteria, UA: NONE SEEN
Bilirubin Urine: NEGATIVE
Glucose, UA: NEGATIVE mg/dL
Hgb urine dipstick: NEGATIVE
Ketones, ur: NEGATIVE mg/dL
Leukocytes,Ua: NEGATIVE
Nitrite: NEGATIVE
Protein, ur: NEGATIVE mg/dL
Specific Gravity, Urine: 1.004 — ABNORMAL LOW (ref 1.005–1.030)
pH: 6 (ref 5.0–8.0)

## 2023-02-12 LAB — RAPID URINE DRUG SCREEN, HOSP PERFORMED
Amphetamines: NOT DETECTED
Barbiturates: NOT DETECTED
Benzodiazepines: NOT DETECTED
Cocaine: NOT DETECTED
Opiates: NOT DETECTED
Tetrahydrocannabinol: NOT DETECTED

## 2023-02-12 LAB — LACTIC ACID, PLASMA
Lactic Acid, Venous: 2.2 mmol/L (ref 0.5–1.9)
Lactic Acid, Venous: 2.2 mmol/L (ref 0.5–1.9)

## 2023-02-12 LAB — ETHANOL: Alcohol, Ethyl (B): 140 mg/dL — ABNORMAL HIGH (ref <10)

## 2023-02-12 LAB — MAGNESIUM: Magnesium: 1.5 mg/dL — ABNORMAL LOW (ref 1.7–2.4)

## 2023-02-12 LAB — PHOSPHORUS: Phosphorus: 3.7 mg/dL (ref 2.5–4.6)

## 2023-02-12 MED ORDER — SODIUM BICARBONATE 650 MG PO TABS
1300.0000 mg | ORAL_TABLET | Freq: Two times a day (BID) | ORAL | Status: DC
  Administered 2023-02-12 – 2023-02-14 (×4): 1300 mg via ORAL
  Filled 2023-02-12 (×4): qty 2

## 2023-02-12 MED ORDER — ACETAMINOPHEN 650 MG RE SUPP: 650.0000 mg | Freq: Four times a day (QID) | RECTAL | Status: DC | PRN

## 2023-02-12 MED ORDER — ADULT MULTIVITAMIN W/MINERALS CH
1.0000 | ORAL_TABLET | Freq: Every day | ORAL | Status: DC
  Administered 2023-02-12 – 2023-02-14 (×3): 1 via ORAL
  Filled 2023-02-12 (×3): qty 1

## 2023-02-12 MED ORDER — MAGNESIUM SULFATE 2 GM/50ML IV SOLN
2.0000 g | Freq: Once | INTRAVENOUS | Status: AC
  Administered 2023-02-12: 2 g via INTRAVENOUS
  Filled 2023-02-12: qty 50

## 2023-02-12 MED ORDER — TACROLIMUS 1 MG PO CAPS
1.0000 mg | ORAL_CAPSULE | Freq: Two times a day (BID) | ORAL | Status: DC
  Administered 2023-02-12 – 2023-02-14 (×4): 1 mg via ORAL
  Filled 2023-02-12 (×5): qty 1

## 2023-02-12 MED ORDER — LACTATED RINGERS IV BOLUS: 1000.0000 mL | Freq: Once | INTRAVENOUS | Status: DC

## 2023-02-12 MED ORDER — HEPARIN SODIUM (PORCINE) 5000 UNIT/ML IJ SOLN
5000.0000 [IU] | Freq: Three times a day (TID) | INTRAMUSCULAR | Status: DC
  Administered 2023-02-12 – 2023-02-14 (×5): 5000 [IU] via SUBCUTANEOUS
  Filled 2023-02-12 (×6): qty 1

## 2023-02-12 MED ORDER — SODIUM CHLORIDE 0.9% FLUSH
3.0000 mL | Freq: Two times a day (BID) | INTRAVENOUS | Status: DC
  Administered 2023-02-13 – 2023-02-14 (×3): 3 mL via INTRAVENOUS

## 2023-02-12 MED ORDER — THIAMINE MONONITRATE 100 MG PO TABS
100.0000 mg | ORAL_TABLET | Freq: Every day | ORAL | Status: DC
  Administered 2023-02-13 – 2023-02-14 (×2): 100 mg via ORAL
  Filled 2023-02-12 (×3): qty 1

## 2023-02-12 MED ORDER — TAMSULOSIN HCL 0.4 MG PO CAPS
0.4000 mg | ORAL_CAPSULE | Freq: Every day | ORAL | Status: DC
  Administered 2023-02-13 – 2023-02-14 (×2): 0.4 mg via ORAL
  Filled 2023-02-12 (×2): qty 1

## 2023-02-12 MED ORDER — THIAMINE HCL 100 MG/ML IJ SOLN
100.0000 mg | Freq: Every day | INTRAMUSCULAR | Status: DC
  Administered 2023-02-12: 100 mg via INTRAVENOUS
  Filled 2023-02-12: qty 2

## 2023-02-12 MED ORDER — ACETAMINOPHEN 325 MG PO TABS: 650.0000 mg | ORAL_TABLET | Freq: Four times a day (QID) | ORAL | Status: DC | PRN

## 2023-02-12 MED ORDER — SODIUM CHLORIDE 0.9 % IV SOLN: INTRAVENOUS | Status: AC

## 2023-02-12 MED ORDER — SODIUM CHLORIDE 0.9 % IV BOLUS
1000.0000 mL | Freq: Once | INTRAVENOUS | Status: AC
  Administered 2023-02-12: 1000 mL via INTRAVENOUS

## 2023-02-12 MED ORDER — FOLIC ACID 1 MG PO TABS
1.0000 mg | ORAL_TABLET | Freq: Every day | ORAL | Status: DC
  Administered 2023-02-12 – 2023-02-14 (×3): 1 mg via ORAL
  Filled 2023-02-12 (×3): qty 1

## 2023-02-12 MED ORDER — PANTOPRAZOLE SODIUM 40 MG PO TBEC
40.0000 mg | DELAYED_RELEASE_TABLET | Freq: Every day | ORAL | Status: DC
  Administered 2023-02-13 – 2023-02-14 (×2): 40 mg via ORAL
  Filled 2023-02-12 (×2): qty 1

## 2023-02-12 MED ORDER — POLYETHYLENE GLYCOL 3350 17 G PO PACK: 17.0000 g | PACK | Freq: Every day | ORAL | Status: DC | PRN

## 2023-02-12 MED ORDER — BUPROPION HCL ER (SR) 150 MG PO TB12
150.0000 mg | ORAL_TABLET | Freq: Every day | ORAL | Status: DC
  Administered 2023-02-13 – 2023-02-14 (×2): 150 mg via ORAL
  Filled 2023-02-12 (×2): qty 1

## 2023-02-12 MED ORDER — SODIUM CHLORIDE 0.9 % IV SOLN: Freq: Once | INTRAVENOUS | Status: AC

## 2023-02-12 NOTE — H&P (Signed)
History and Physical   [name] WUJ:[id number] DOB: July 24, 1951 DOA: 02/12/2023  PCP: Pcp, No   Patient coming from: Home  Chief Complaint: Weakness  HPI: [name] is a 72 y.o. male with medical history significant of alcohol use, liver transplant x 2, thrombocytopenia, anemia, CKD 3B, GERD, depression, spinal stenosis status post surgical intervention, hypertension.    Patient presenting with generalized weakness.  States he has had difficulty with generalized weakness, possibly worse in his legs.  Called EMS and patient was transported to the ED.  Patient reports the has continued to drink since discharge from recent admission including this AM.  Patient recently admitted for altered mental status, fall, electrolyte disturbance, AKI.  Improved with fluid resuscitation.  Notably did not have any withdrawal symptoms at that time.  Denies fevers, chills, chest pain, shortness of breath, abdominal pain, constipation, diarrhea, nausea, vomiting.  ED Course: Signs in the ED notable for blood pressure in the 80s to 90s systolic.  Lab workup included CMP with bicarb 21, creatinine elevated to 4.63 from baseline of around 2, calcium 8.7, protein 5.6, albumin 2.6.  Magnesium and phosphorus pending.  CBC showed hemoglobin stable at 10.1 and WBC stable at 3.7.  Lactic acid pending.  Ethanol level 140.  UDS, urinalysis, blood cultures pending.  Chest x-ray showed enlarged cardiac silhouette but no overt CHF.  Patient was ordered 2 L of IV fluids which were just being started at the time of admission.  Review of Systems: As per HPI otherwise all other systems reviewed and are negative.  Past Medical History:  Diagnosis Date   ALCOHOL ABUSE, HX OF    ANEMIA, IRON DEFICIENCY    DERMATITIS    Hepatic encephalopathy (HCC)    HEPATIC FAILURE, END STAGE 2004   liver transplant 2004; 2nd orthotopic liver transplant 01/25/11 [facility] [phone] dr. Fayrene Fearing dewar   Hypertension    THROMBOCYTOPENIA      Past Surgical History:  Procedure Laterality Date   (L) arm broke     Broke (L) ankle   2001   HERNIA REPAIR     LIVER TRANSPLANT  1st 04/21/03; 2nd 01/25/11   @ [facility] dr. dewar [phone];  [phone]    Social History  reports that he has never smoked. He has never used smokeless tobacco. He reports current alcohol use. He reports that he does not use drugs.  Allergies  Allergen Reactions   Tape Other (See Comments)    SKIN TEARS VERY EASILY!!!!! Can tolerate ONLY paper tape.   Zolpidem Other (See Comments)    Confusion   Moxifloxacin Rash and Other (See Comments)    Achilles tendon pain also    Family History  Problem Relation Age of Onset   Arthritis Mother    Hypertension Mother    Lung cancer Father    Heart disease Father    Cancer Father        lung cancer   Hypertension Sister   Reviewed on admission  Prior to Admission medications   Medication Sig Start Date End Date Taking? Authorizing Provider  Ascorbic Acid (VITAMIN C) 500 MG tablet Take 500 mg by mouth daily.      [provider]  buPROPion (WELLBUTRIN SR) 150 MG 12 hr tablet Take 1 tablet (150 mg total) by mouth daily. 01/27/23   Lorin Glass, MD  Calcium-Magnesium-Vitamin D (CITRACAL CALCIUM+D PO) Take 1 tablet by mouth daily.    [provider]  famotidine (PEPCID) 40 MG tablet Take 40  mg by mouth at bedtime.    [provider]  ferrous sulfate 325 (65 FE) MG tablet Take 1 tablet (325 mg total) by mouth daily with breakfast. 01/28/23 04/28/23  Lorin Glass, MD  Magnesium Oxide 400 MG CAPS Take 400 mg by mouth daily as needed (cramps).    [provider]  metoprolol (LOPRESSOR) 50 MG tablet Take 1 tablet (50 mg total) by mouth 2 (two) times daily. 05/04/11   Newt Lukes, MD  Multiple Vitamin (MULTIVITAMIN) tablet Take 1 tablet by mouth daily with breakfast.    [provider]  pantoprazole (PROTONIX) 40 MG tablet Take 1 tablet (40  mg total) by mouth daily. 01/28/23 04/28/23  Lorin Glass, MD  senna-docusate (SENOKOT-S) 8.6-50 MG tablet Take 1 tablet by mouth daily. 01/27/23 04/27/23  Lorin Glass, MD  sodium bicarbonate 650 MG tablet Take 1,300 mg by mouth 2 (two) times daily.     [provider]  tacrolimus (PROGRAF) 1 MG capsule Take 1 mg by mouth in the morning and at bedtime.    [provider]  tamsulosin (FLOMAX) 0.4 MG CAPS capsule Take 0.4 mg by mouth daily.    [provider]  traZODone (DESYREL) 100 MG tablet Take 100 mg by mouth at bedtime as needed for sleep.    [provider]    Physical Exam: Vitals:   02/12/23 1507 02/12/23 1525 02/12/23 1527 02/12/23 1530  BP: 95/63 (!) 84/59  (!) 83/57  Pulse: 78 78  76  Resp: 18 16  15   Temp: 97.8 F (36.6 C)     TempSrc: Oral     SpO2: 99% 100%  98%  Weight:   62.5 kg   Height:   5\' 8"  (1.727 m)     Physical Exam Constitutional:      General: He is not in acute distress.    Appearance: Normal appearance.  HENT:     Head: Normocephalic and atraumatic.     Mouth/Throat:     Mouth: Mucous membranes are moist.     Pharynx: Oropharynx is clear.  Eyes:     Extraocular Movements: Extraocular movements intact.     Pupils: Pupils are equal, round, and reactive to light.  Cardiovascular:     Rate and Rhythm: Normal rate and regular rhythm.     Pulses: Normal pulses.     Heart sounds: Normal heart sounds.  Pulmonary:     Effort: Pulmonary effort is normal. No respiratory distress.     Breath sounds: Normal breath sounds.  Abdominal:     General: Bowel sounds are normal. There is no distension.     Palpations: Abdomen is soft.     Tenderness: There is no abdominal tenderness.  Musculoskeletal:        General: No swelling or deformity.  Skin:    General: Skin is warm and dry.  Neurological:     General: No focal deficit present.     Mental Status: Mental status is at baseline.    Labs on Admission: I have personally  reviewed following labs and imaging studies  CBC: Recent Labs  Lab 02/07/23 1033 02/12/23 1545  WBC 5.2 3.7*  NEUTROABS 3.8 2.1  HGB 11.6* 10.1*  HCT 35.8* 30.7*  MCV 101.4* 101.0*  PLT 268 172    Basic Metabolic Panel: Recent Labs  Lab 02/07/23 1033 02/12/23 1545  NA 137 137  K 4.3 3.5  CL 103 103  CO2 26 21*  GLUCOSE 84 89  BUN  20 19  CREATININE 3.14* 4.63*  CALCIUM 9.6 8.7*  MG 1.7  --     GFR: Estimated Creatinine Clearance: 13.3 mL/min (A) (by C-G formula based on SCr of 4.63 mg/dL (H)).  Liver Function Tests: Recent Labs  Lab 02/07/23 1033 02/12/23 1545  AST 31 17  ALT 21 14  ALKPHOS 88 60  BILITOT 0.8 0.6  PROT 7.4 5.6*  ALBUMIN 3.4* 2.6*    Urine analysis:    Component Value Date/Time   COLORURINE YELLOW 02/07/2023 1821   APPEARANCEUR CLEAR 02/07/2023 1821   LABSPEC 1.008 02/07/2023 1821   PHURINE 7.0 02/07/2023 1821   GLUCOSEU NEGATIVE 02/07/2023 1821   GLUCOSEU NEGATIVE 09/22/2011 1445   HGBUR NEGATIVE 02/07/2023 1821   BILIRUBINUR NEGATIVE 02/07/2023 1821   KETONESUR NEGATIVE 02/07/2023 1821   PROTEINUR NEGATIVE 02/07/2023 1821   UROBILINOGEN 0.2 10/07/2014 1439   NITRITE NEGATIVE 02/07/2023 1821   LEUKOCYTESUR NEGATIVE 02/07/2023 1821    Radiological Exams on Admission: DG Chest Port 1 View  Result Date: 02/12/2023 CLINICAL DATA:  Weakness. EXAM: PORTABLE CHEST 1 VIEW COMPARISON:  10/07/2014 FINDINGS: The cardio pericardial silhouette is enlarged. Lungs are hyperexpanded. The lungs are clear without focal pneumonia, edema, pneumothorax or pleural effusion. No acute bony abnormality. Telemetry leads overlie the chest. IMPRESSION: Enlargement of the cardiopericardial silhouette without overt failure. No acute cardiopulmonary findings. Electronically Signed   By: Kennith Center M.D.   On: 02/12/2023 15:34    EKG: Independently reviewed.  Sinus rhythm at 70 bpm.  Nonspecific T wave changes.  Assessment/Plan Principal Problem:   Acute  renal failure superimposed on stage 3b chronic kidney disease (HCC) Active Problems:   ANEMIA, IRON DEFICIENCY   History of liver transplant (HCC)   ALCOHOL ABUSE, HX OF   Hypertension   CKD (chronic kidney disease) stage 3, GFR 30-59 ml/min (HCC)   AKI on CKD 3B Hypotension > Patient presenting with generalized weakness found to have AKI with creatinine elevated to 4.63 from baseline of 2. > Presented after a fall and was found down recently and had AKI at that time responded to IV fluids. > Has had continued alcohol use and decrease other fluid intake. > Intermittent low blood pressure in the ED but is not yet received IV fluids.  2 L have been ordered. - Monitor in progressive unit for now while monitoring blood pressure response to IV fluids - Continue with 2 L IV fluid bolus ordered. - Continue with maintenance fluids following this - Follow-up magnesium, phosphorus - Trend renal function and electrolytes - Continue home bicarb  Alcohol use > Previous history of alcohol use status post liver transplant x 2 as below. > LFT stable.  Currently pleasantly intoxicated with ethanol level of 140. > Notably did not have withdrawal symptoms during previous admission per discharge summary. > Reportedly continues to drink about a pint a day. - Trend LFTs - CIWA, without Ativan for now - Daily thiamine, folate, multivitamin - Continue home PPI  Status post liver transplant x 2 > In 2004 and 2012 at [facility].  Continues to take home tacrolimus. > LFTs stable - Trend LFTs - Continue home tacrolimus  GERD - Continue PPI  Depression - Continue home bupropion  Recent bladder outlet obstruction > Temporarily catheterize during recent admission. - Continue home tamsulosin  DVT prophylaxis: Heparin Code Status:   Full Family Communication:  None on admission  Disposition Plan:   Patient is from:  Home  Anticipated DC to:  Home  Anticipated DC  date:  1 to 3 days  Anticipated DC barriers: None  Consults called:  None Admission status:  Observation, progressive  Severity of Illness: The appropriate patient status for this patient is OBSERVATION. Observation status is judged to be reasonable and necessary in order to provide the required intensity of service to ensure the patient's safety. The patient's presenting symptoms, physical exam findings, and initial radiographic and laboratory data in the context of their medical condition is felt to place them at decreased risk for further clinical deterioration. Furthermore, it is anticipated that the patient will be medically stable for discharge from the hospital within 2 midnights of admission.    Synetta Fail MD [facility]  How to contact the [facility] Attending or Consulting provider 7A - 7P or covering provider during after hours 7P -7A, for this patient?   Check the care team in [facility] and look for a) attending/consulting [facility] provider listed and b) the [facility] team listed Log into [email or web address] and use [facility] universal password to access. If you do not have the password, please contact the hospital operator. Locate the [facility] provider you are looking for under [facility] and page to a number that you can be directly reached. If you still have difficulty reaching the provider, please page the [facility] (Director on Call) for the Hospitalists listed on amion for assistance.  02/12/2023, 5:10 PM

## 2023-02-12 NOTE — ED Provider Notes (Signed)
[name] AT [facility] Provider Note   CSN: [id number] Arrival date & time: 02/12/23  1456     History  Chief Complaint  Patient presents with   Weakness    [name] is a 72 y.o. male.  72 year old male with prior medical history as detailed below presents for evaluation.  Patient complains of generalized weakness.  He admits to drinking alcohol earlier today.  He reports that he drinks alcohol almost every day.  Despite repeated questioning he is unable to quantify his daily alcohol intake.  He denies fever.  He denies chest pain or shortness of breath.  He denies head injury.  Patient reports that he is compliant with previously prescribed medications including tacrolimus.  Excerpt from recent DC summary below:  "PMH significant for liver transplants twice (2004 and 2012), who relapsed to alcoholism in 2022 and currently drinks at least a pint daily.  Also with history of depression, CKD, GERD, osteoporosis, basal cell cancer, severe spinal stenosis L4,5, advanced spinal stenosis L 3,4 and Cervical spinal stenosis followed at [facility], h/o right L4-5 facet cystectomy in 2017 Patient lives at home alone."  Baseline Cr appears to be 2.55 from April 2023.  No establised PCP locally per report.  Last admission for AKI improved with IVF/rehydration.     The history is provided by the patient and medical records.       Home Medications Prior to Admission medications   Medication Sig Start Date End Date Taking? Authorizing Provider  Ascorbic Acid (VITAMIN C) 500 MG tablet Take 500 mg by mouth daily.      [provider]  buPROPion (WELLBUTRIN SR) 150 MG 12 hr tablet Take 1 tablet (150 mg total) by mouth daily. 01/27/23   Lorin Glass, MD  Calcium-Magnesium-Vitamin D (CITRACAL CALCIUM+D PO) Take 1 tablet by mouth daily.    [provider]  famotidine (PEPCID) 40 MG tablet Take 40 mg by mouth at bedtime.     [provider]  ferrous sulfate 325 (65 FE) MG tablet Take 1 tablet (325 mg total) by mouth daily with breakfast. 01/28/23 04/28/23  Lorin Glass, MD  Magnesium Oxide 400 MG CAPS Take 400 mg by mouth daily as needed (cramps).    [provider]  metoprolol (LOPRESSOR) 50 MG tablet Take 1 tablet (50 mg total) by mouth 2 (two) times daily. 05/04/11   Newt Lukes, MD  Multiple Vitamin (MULTIVITAMIN) tablet Take 1 tablet by mouth daily with breakfast.    [provider]  pantoprazole (PROTONIX) 40 MG tablet Take 1 tablet (40 mg total) by mouth daily. 01/28/23 04/28/23  Lorin Glass, MD  senna-docusate (SENOKOT-S) 8.6-50 MG tablet Take 1 tablet by mouth daily. 01/27/23 04/27/23  Lorin Glass, MD  sodium bicarbonate 650 MG tablet Take 1,300 mg by mouth 2 (two) times daily.     [provider]  tacrolimus (PROGRAF) 1 MG capsule Take 1 mg by mouth in the morning and at bedtime.    [provider]  tamsulosin (FLOMAX) 0.4 MG CAPS capsule Take 0.4 mg by mouth daily.    [provider]  traZODone (DESYREL) 100 MG tablet Take 100 mg by mouth at bedtime as needed for sleep.    [provider]      Allergies    Tape, Zolpidem, and Moxifloxacin    Review of Systems   Review of Systems  All other systems reviewed and are negative.   Physical Exam Updated Vital  Signs BP (!) 83/57   Pulse 76   Temp 97.8 F (36.6 C) (Oral)   Resp 15   Ht 5\' 8"  (1.727 m)   Wt 62.5 kg   SpO2 98%   BMI 20.95 kg/m  Physical Exam Vitals and nursing note reviewed.  Constitutional:      General: He is not in acute distress.    Appearance: Normal appearance. He is well-developed.  HENT:     Head: Normocephalic and atraumatic.  Eyes:     Conjunctiva/sclera: Conjunctivae normal.     Pupils: Pupils are equal, round, and reactive to light.  Cardiovascular:     Rate and Rhythm: Normal rate and regular rhythm.     Heart sounds: Normal heart sounds.   Pulmonary:     Effort: Pulmonary effort is normal. No respiratory distress.     Breath sounds: Normal breath sounds.  Abdominal:     General: There is no distension.     Palpations: Abdomen is soft.     Tenderness: There is no abdominal tenderness.  Musculoskeletal:        General: No deformity. Normal range of motion.     Cervical back: Normal range of motion and neck supple.  Skin:    General: Skin is warm and dry.  Neurological:     General: No focal deficit present.     Mental Status: He is alert and oriented to person, place, and time.     ED Results / Procedures / Treatments   Labs (all labs ordered are listed, but only abnormal results are displayed) Labs Reviewed  CBC WITH DIFFERENTIAL/PLATELET - Abnormal; Notable for the following components:      Result Value   WBC 3.7 (*)    RBC 3.04 (*)    Hemoglobin 10.1 (*)    HCT 30.7 (*)    MCV 101.0 (*)    All other components within normal limits  ETHANOL - Abnormal; Notable for the following components:   Alcohol, Ethyl (B) 140 (*)    All other components within normal limits  COMPREHENSIVE METABOLIC PANEL    EKG EKG Interpretation  Date/Time:  Saturday February 12 2023 15:10:23 EDT Ventricular Rate:  78 PR Interval:  252 QRS Duration: 103 QT Interval:  406 QTC Calculation: 463 R Axis:   -10 Text Interpretation: Sinus rhythm Prolonged PR interval Left ventricular hypertrophy Probable inferior infarct, age indeterminate Confirmed by Kristine Royal ([id number]) on 02/12/2023 3:13:06 PM  Radiology DG Chest Port 1 View  Result Date: 02/12/2023 CLINICAL DATA:  Weakness. EXAM: PORTABLE CHEST 1 VIEW COMPARISON:  10/07/2014 FINDINGS: The cardio pericardial silhouette is enlarged. Lungs are hyperexpanded. The lungs are clear without focal pneumonia, edema, pneumothorax or pleural effusion. No acute bony abnormality. Telemetry leads overlie the chest. IMPRESSION: Enlargement of the cardiopericardial silhouette without overt failure.  No acute cardiopulmonary findings. Electronically Signed   By: Kennith Center M.D.   On: 02/12/2023 15:34    Procedures Procedures    Medications Ordered in ED Medications  lactated ringers bolus 1,000 mL (has no administration in time range)    ED Course/ Medical Decision Making/ A&P                             Medical Decision Making Amount and/or Complexity of Data Reviewed Labs: ordered. Radiology: ordered.    Medical Screen Complete  This patient presented to the ED with complaint of weakness, ETOH intoxication  This complaint involves  an extensive number of treatment options. The initial differential diagnosis includes, but is not limited to, metabolic abnormality, AKI, dehydration, EtOH intoxication, etc.  This presentation is: Acute, Chronic, Self-Limited, Previously Undiagnosed, Uncertain Prognosis, Complicated, Systemic Symptoms, and Threat to Life/Bodily Function  Patient was presenting with complaint of generalized weakness.  Patient admits to alcohol intake earlier today.  He is clinically intoxicated.  Alcohol level is elevated at 140.  Patient with evidence of AKI.  IV fluids initiated here in the ED.  Patient would benefit from admission for further workup and treatment.  Hospitalist service is aware of case and evaluate for same.  Additional history obtained:  External records from outside sources obtained and reviewed including prior ED visits and prior Inpatient records.    Lab Tests:  I ordered and personally interpreted labs.    Imaging Studies ordered:  I ordered imaging studies including CXR  I independently visualized and interpreted obtained imaging which showed NAD I agree with the radiologist interpretation.   Cardiac Monitoring:  The patient was maintained on a cardiac monitor.  I personally viewed and interpreted the cardiac monitor which showed an underlying rhythm of: NSR   Medicines ordered:  I ordered medication  including IVF  for suspected dehydration  Reevaluation of the patient after these medicines showed that the patient: improved    Problem List / ED Course:  AKI, ETOH Intoxication   Reevaluation:  After the interventions noted above, I reevaluated the patient and found that they have: improved  Disposition:  After consideration of the diagnostic results and the patients response to treatment, I feel that the patent would benefit from admission.          Final Clinical Impression(s) / ED Diagnoses Final diagnoses:  Weakness  AKI (acute kidney injury) (HCC)  Alcoholic intoxication without complication [facility])    Rx / DC Orders ED Discharge Orders     None         Wynetta Fines, MD 02/12/23 1704

## 2023-02-12 NOTE — ED Notes (Signed)
Ems gave him of nss from his home

## 2023-02-12 NOTE — ED Notes (Signed)
The pt is asking for food x3  lab results need completion

## 2023-02-12 NOTE — Progress Notes (Signed)
Pt arrived to 6E21 on stretcher from ED in no acute distress, no pain, VSS. Mg resulted 1.5.  Dr. Arville Care notified.

## 2023-02-12 NOTE — Progress Notes (Signed)
Pt lactic result 2.2 - MD notified.  IVF at 150/hr infusing.

## 2023-02-12 NOTE — ED Notes (Signed)
The pt has not voided since he arrived here

## 2023-02-12 NOTE — ED Notes (Signed)
The pt admits to alcohol  and earlier today he was drinking alcohol

## 2023-02-12 NOTE — ED Notes (Signed)
ED TO INPATIENT HANDOFF REPORT  ED Nurse Name and Phone #: [phone]  S Name/Age/Gender [name] 72 y.o. male Room/Bed: 002C/002C  Code Status   Code Status: Full Code  Home/SNF/Other Home Patient oriented to: self, place, time, and situation Is this baseline? Yes   Triage Complete: Triage complete  Chief Complaint Acute renal failure superimposed on stage 3b chronic kidney disease (HCC) [N17.9, N18.32]  Triage Note The pt came from home after he had some weakness and could not make his legs move after driving his car  somewhere then coming home.  He arrived by [facility]  from there he arrived alert and oriented  he is c/o some back pain for awhile.  Iv per ems    Allergies Allergies  Allergen Reactions   Tape Other (See Comments)    SKIN TEARS VERY EASILY!!!!! Can tolerate ONLY paper tape.   Zolpidem Other (See Comments)    Confusion   Moxifloxacin Rash and Other (See Comments)    Achilles tendon pain also    Level of Care/Admitting Diagnosis ED Disposition     ED Disposition  Admit   Condition  --   Comment  Hospital Area: [facility] [[id number]]  Level of Care: Progressive [102]  Admit to Progressive based on following criteria: Other see comments  Admit to Progressive based on following criteria: RESPIRATORY PROBLEMS hypoxemic/hypercapnic respiratory failure that is responsive to NIPPV (BiPAP) or High Flow Nasal Cannula (6-80 lpm). Frequent assessment/intervention, no > Q2 hrs < Q4 hrs, to maintain oxygenation and pulmonary hygiene.  Comments: Awaiting BP response to IVF, currently borderline Hypotension, IVF just started in ED  May place patient in observation at [facility] or [facility] if equivalent level of care is available:: No  Covid Evaluation: Asymptomatic - no recent exposure (last 10 days) testing not required  Diagnosis: Acute renal failure superimposed on stage 3b chronic kidney disease [facility]) [[id number]]  Admitting Physician:  Synetta Fail [[id number]]  Attending Physician: Synetta Fail [phone]          B Medical/Surgery History Past Medical History:  Diagnosis Date   ALCOHOL ABUSE, HX OF    ANEMIA, IRON DEFICIENCY    DERMATITIS    Hepatic encephalopathy (HCC)    HEPATIC FAILURE, END STAGE 2004   liver transplant 2004; 2nd orthotopic liver transplant 01/25/11 [facility] [phone] dr. Fayrene Fearing dewar   Hypertension    THROMBOCYTOPENIA    Past Surgical History:  Procedure Laterality Date   (L) arm broke     Broke (L) ankle   2001   HERNIA REPAIR     LIVER TRANSPLANT  1st 04/21/03; 2nd 01/25/11   @ [facility] dr. Elmer Picker [phone];  [phone]     A IV Location/Drains/Wounds Patient Lines/Drains/Airways Status     Active Line/Drains/Airways     Name Placement date Placement time Site Days   Peripheral IV 02/12/23 18 G Left Antecubital 02/12/23  1531  Antecubital  less than 1   Wound / Incision (Open or Dehisced) 01/22/23 Skin tear Eye Right dried, swollen, reddened area around eye, eye swollen shut 01/22/23  1818  Eye  21            Intake/Output Last 24 hours No intake or output data in the 24 hours ending 02/12/23 1835  Labs/Imaging Results for orders placed or performed during the hospital encounter of 02/12/23 (from the past 48 hour(s))  CBC with Differential     Status: Abnormal   Collection Time: 02/12/23  3:45 PM  Result Value Ref Range   WBC 3.7 (L) 4.0 - 10.5 K/uL   RBC 3.04 (L) 4.22 - 5.81 MIL/uL   Hemoglobin 10.1 (L) 13.0 - 17.0 g/dL   HCT 16.1 (L) 09.6 - 04.5 %   MCV 101.0 (H) 80.0 - 100.0 fL   MCH 33.2 26.0 - 34.0 pg   MCHC 32.9 30.0 - 36.0 g/dL   RDW 40.9 81.1 - 91.4 %   Platelets 172 150 - 400 K/uL   nRBC 0.0 0.0 - 0.2 %   Neutrophils Relative % 58 %   Neutro Abs 2.1 1.7 - 7.7 K/uL   Lymphocytes Relative 26 %   Lymphs Abs 1.0 0.7 - 4.0 K/uL   Monocytes Relative 13 %   Monocytes Absolute 0.5 0.1 - 1.0 K/uL   Eosinophils Relative 2 %    Eosinophils Absolute 0.1 0.0 - 0.5 K/uL   Basophils Relative 0 %   Basophils Absolute 0.0 0.0 - 0.1 K/uL   Immature Granulocytes 1 %   Abs Immature Granulocytes 0.02 0.00 - 0.07 K/uL    Comment: Performed at [facility], [address] [place], [place] [address]  Comprehensive metabolic panel     Status: Abnormal   Collection Time: 02/12/23  3:45 PM  Result Value Ref Range   Sodium 137 135 - 145 mmol/L   Potassium 3.5 3.5 - 5.1 mmol/L   Chloride 103 98 - 111 mmol/L   CO2 21 (L) 22 - 32 mmol/L   Glucose, Bld 89 70 - 99 mg/dL    Comment: Glucose reference range applies only to samples taken after fasting for at least 8 hours.   BUN 19 8 - 23 mg/dL   Creatinine, Ser 6.21 (H) 0.61 - 1.24 mg/dL   Calcium 8.7 (L) 8.9 - 10.3 mg/dL   Total Protein 5.6 (L) 6.5 - 8.1 g/dL   Albumin 2.6 (L) 3.5 - 5.0 g/dL   AST 17 15 - 41 U/L   ALT 14 0 - 44 U/L   Alkaline Phosphatase 60 38 - 126 U/L   Total Bilirubin 0.6 0.3 - 1.2 mg/dL   GFR, Estimated 13 (L) >60 mL/min    Comment: (NOTE) Calculated using the CKD-EPI Creatinine Equation (2021)    Anion gap 13 5 - 15    Comment: Performed at [facility], [address] [place], [place] [address]  Ethanol     Status: Abnormal   Collection Time: 02/12/23  3:48 PM  Result Value Ref Range   Alcohol, Ethyl (B) 140 (H) <10 mg/dL    Comment: (NOTE) Lowest detectable limit for serum alcohol is 10 mg/dL.  For medical purposes only. Performed at [facility], [address], [place], [place] [address]    DG Chest Port 1 View  Result Date: 02/12/2023 CLINICAL DATA:  Weakness. EXAM: PORTABLE CHEST 1 VIEW COMPARISON:  10/07/2014 FINDINGS: The cardio pericardial silhouette is enlarged. Lungs are hyperexpanded. The lungs are clear without focal pneumonia, edema, pneumothorax or pleural effusion. No acute bony abnormality. Telemetry leads overlie the chest. IMPRESSION: Enlargement of the cardiopericardial silhouette without overt failure. No  acute cardiopulmonary findings. Electronically Signed   By: Kennith Center M.D.   On: 02/12/2023 15:34    Pending Labs Unresulted Labs (From admission, onward)     Start     Ordered   02/13/23 0500  Comprehensive metabolic panel  Tomorrow morning,   R        02/12/23 1707  02/13/23 0500  CBC  Tomorrow morning,   R        02/12/23 1707   02/12/23 1649  Magnesium  Once,   STAT        02/12/23 1648   02/12/23 1649  Phosphorus  Once,   STAT        02/12/23 1648   02/12/23 1635  Urine rapid drug screen (hosp performed)  Once,   STAT        02/12/23 1634   02/12/23 1635  Urinalysis, w/ Reflex to Culture (Infection Suspected) -Urine, Clean Catch  Once,   URGENT       Question:  Specimen Source  Answer:  Urine, Clean Catch   02/12/23 1634   02/12/23 1635  Culture, blood (routine x 2)  BLOOD CULTURE X 2,   R (with STAT occurrences)      02/12/23 1634   02/12/23 1635  Lactic acid, plasma  Now then every 2 hours,   R (with STAT occurrences)      02/12/23 1634            Vitals/Pain Today's Vitals   02/12/23 1525 02/12/23 1527 02/12/23 1530 02/12/23 1531  BP: (!) 84/59  (!) 83/57   Pulse: 78  76   Resp: 16  15   Temp:      TempSrc:      SpO2: 100%  98%   Weight:  62.5 kg    Height:  5\' 8"  (1.727 m)    PainSc:  0-No pain  0-No pain    Isolation Precautions No active isolations  Medications Medications  buPROPion (WELLBUTRIN SR) 12 hr tablet 150 mg (has no administration in time range)  sodium bicarbonate tablet 1,300 mg (has no administration in time range)  pantoprazole (PROTONIX) EC tablet 40 mg (has no administration in time range)  tamsulosin (FLOMAX) capsule 0.4 mg (has no administration in time range)  tacrolimus (PROGRAF) capsule 1 mg (has no administration in time range)  heparin injection 5,000 Units (has no administration in time range)  sodium chloride flush (NS) 0.9 % injection 3 mL (has no administration in time range)  acetaminophen (TYLENOL) tablet 650 mg  (has no administration in time range)    Or  acetaminophen (TYLENOL) suppository 650 mg (has no administration in time range)  0.9 %  sodium chloride infusion (has no administration in time range)  polyethylene glycol (MIRALAX / GLYCOLAX) packet 17 g (has no administration in time range)  thiamine (VITAMIN B1) tablet 100 mg (has no administration in time range)    Or  thiamine (VITAMIN B1) injection 100 mg (has no administration in time range)  folic acid (FOLVITE) tablet 1 mg (has no administration in time range)  multivitamin with minerals tablet 1 tablet (has no administration in time range)  sodium chloride 0.9 % bolus 1,000 mL (0 mLs Intravenous Stopped 02/12/23 1800)  0.9 %  sodium chloride infusion ( Intravenous New Bag/Given 02/12/23 1803)    Mobility walks     Focused Assessments    R Recommendations: See Admitting Provider Note  Report given to:   Additional Notes:

## 2023-02-12 NOTE — ED Triage Notes (Signed)
The pt came from home after he had some weakness and could not make his legs move after driving his car  somewhere then coming home.  He arrived by [facility]  from there he arrived alert and oriented  he is c/o some back pain for awhile.  Iv per ems

## 2023-02-13 DIAGNOSIS — R531 Weakness: Secondary | ICD-10-CM | POA: Diagnosis present

## 2023-02-13 DIAGNOSIS — M4802 Spinal stenosis, cervical region: Secondary | ICD-10-CM | POA: Diagnosis present

## 2023-02-13 DIAGNOSIS — I129 Hypertensive chronic kidney disease with stage 1 through stage 4 chronic kidney disease, or unspecified chronic kidney disease: Secondary | ICD-10-CM | POA: Diagnosis present

## 2023-02-13 DIAGNOSIS — W19XXXA Unspecified fall, initial encounter: Secondary | ICD-10-CM | POA: Diagnosis present

## 2023-02-13 DIAGNOSIS — Z8249 Family history of ischemic heart disease and other diseases of the circulatory system: Secondary | ICD-10-CM | POA: Diagnosis not present

## 2023-02-13 DIAGNOSIS — Z91048 Other nonmedicinal substance allergy status: Secondary | ICD-10-CM | POA: Diagnosis not present

## 2023-02-13 DIAGNOSIS — Z85828 Personal history of other malignant neoplasm of skin: Secondary | ICD-10-CM | POA: Diagnosis not present

## 2023-02-13 DIAGNOSIS — Z944 Liver transplant status: Secondary | ICD-10-CM | POA: Diagnosis not present

## 2023-02-13 DIAGNOSIS — N1832 Chronic kidney disease, stage 3b: Secondary | ICD-10-CM | POA: Diagnosis present

## 2023-02-13 DIAGNOSIS — N179 Acute kidney failure, unspecified: Secondary | ICD-10-CM | POA: Diagnosis present

## 2023-02-13 DIAGNOSIS — E872 Acidosis, unspecified: Secondary | ICD-10-CM | POA: Diagnosis present

## 2023-02-13 DIAGNOSIS — K219 Gastro-esophageal reflux disease without esophagitis: Secondary | ICD-10-CM | POA: Diagnosis present

## 2023-02-13 DIAGNOSIS — F10220 Alcohol dependence with intoxication, uncomplicated: Secondary | ICD-10-CM | POA: Diagnosis present

## 2023-02-13 DIAGNOSIS — Z888 Allergy status to other drugs, medicaments and biological substances status: Secondary | ICD-10-CM | POA: Diagnosis not present

## 2023-02-13 DIAGNOSIS — E869 Volume depletion, unspecified: Secondary | ICD-10-CM | POA: Diagnosis present

## 2023-02-13 DIAGNOSIS — Z79899 Other long term (current) drug therapy: Secondary | ICD-10-CM | POA: Diagnosis not present

## 2023-02-13 DIAGNOSIS — I959 Hypotension, unspecified: Secondary | ICD-10-CM | POA: Diagnosis not present

## 2023-02-13 DIAGNOSIS — Z881 Allergy status to other antibiotic agents status: Secondary | ICD-10-CM | POA: Diagnosis not present

## 2023-02-13 DIAGNOSIS — N170 Acute kidney failure with tubular necrosis: Secondary | ICD-10-CM

## 2023-02-13 DIAGNOSIS — M810 Age-related osteoporosis without current pathological fracture: Secondary | ICD-10-CM | POA: Diagnosis present

## 2023-02-13 DIAGNOSIS — Y906 Blood alcohol level of 120-199 mg/100 ml: Secondary | ICD-10-CM | POA: Diagnosis present

## 2023-02-13 DIAGNOSIS — N178 Other acute kidney failure: Secondary | ICD-10-CM | POA: Diagnosis not present

## 2023-02-13 DIAGNOSIS — F32A Depression, unspecified: Secondary | ICD-10-CM | POA: Diagnosis present

## 2023-02-13 DIAGNOSIS — D509 Iron deficiency anemia, unspecified: Secondary | ICD-10-CM | POA: Diagnosis present

## 2023-02-13 LAB — CBC
HCT: 31.4 % — ABNORMAL LOW (ref 39.0–52.0)
Hemoglobin: 10.1 g/dL — ABNORMAL LOW (ref 13.0–17.0)
MCH: 32.6 pg (ref 26.0–34.0)
MCHC: 32.2 g/dL (ref 30.0–36.0)
MCV: 101.3 fL — ABNORMAL HIGH (ref 80.0–100.0)
Platelets: 159 10*3/uL (ref 150–400)
RBC: 3.1 MIL/uL — ABNORMAL LOW (ref 4.22–5.81)
RDW: 13 % (ref 11.5–15.5)
WBC: 3.3 10*3/uL — ABNORMAL LOW (ref 4.0–10.5)
nRBC: 0 % (ref 0.0–0.2)

## 2023-02-13 LAB — COMPREHENSIVE METABOLIC PANEL
ALT: 14 U/L (ref 0–44)
AST: 18 U/L (ref 15–41)
Albumin: 2.5 g/dL — ABNORMAL LOW (ref 3.5–5.0)
Alkaline Phosphatase: 65 U/L (ref 38–126)
Anion gap: 11 (ref 5–15)
BUN: 16 mg/dL (ref 8–23)
CO2: 21 mmol/L — ABNORMAL LOW (ref 22–32)
Calcium: 8.8 mg/dL — ABNORMAL LOW (ref 8.9–10.3)
Chloride: 107 mmol/L (ref 98–111)
Creatinine, Ser: 3.98 mg/dL — ABNORMAL HIGH (ref 0.61–1.24)
GFR, Estimated: 16 mL/min — ABNORMAL LOW (ref >60)
Glucose, Bld: 86 mg/dL (ref 70–99)
Potassium: 3.8 mmol/L (ref 3.5–5.1)
Sodium: 139 mmol/L (ref 135–145)
Total Bilirubin: 0.4 mg/dL (ref 0.3–1.2)
Total Protein: 5.5 g/dL — ABNORMAL LOW (ref 6.5–8.1)

## 2023-02-13 LAB — LACTIC ACID, PLASMA: Lactic Acid, Venous: 1 mmol/L (ref 0.5–1.9)

## 2023-02-13 LAB — CULTURE, BLOOD (ROUTINE X 2): Special Requests: ADEQUATE

## 2023-02-13 MED ORDER — ENSURE ENLIVE PO LIQD
237.0000 mL | Freq: Two times a day (BID) | ORAL | Status: DC
  Administered 2023-02-13 – 2023-02-14 (×2): 237 mL via ORAL

## 2023-02-13 NOTE — Progress Notes (Signed)
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  Patient continues to drink alcohol.  Patient has been admitted with acute kidney injury on chronic kidney disease, weakness and daily alcohol use.  Patient is a poor historian.  Patient was recently managed on an inpatient basis for altered mental status, falls, electrolyte abnormalities and acute kidney injury.  Patient tells me that he is visiting his girlfriend from out of state.  Patient also tells me that he is currently being evaluated for possible kidney transplantation.  02/13/2023: Patient seen.  No withdrawal symptoms.  Patient is a poor historian   Assessment & Plan:   Principal Problem:   Acute renal failure superimposed on stage 3b chronic kidney disease (HCC) Active Problems:   ANEMIA, IRON DEFICIENCY   History of liver transplant (HCC)   ALCOHOL ABUSE, HX OF   Hypertension   CKD (chronic kidney disease) stage 3, GFR 30-59 ml/min (HCC)   AKI on CKD 3B Hypotension -Patient has baseline chronic kidney disease stage IIIb. -Patient presented with generalized weakness found to have AKI with creatinine elevated to 4.63 from baseline of 2. -Patient tells me that he is currently being worked up for possible kidney transplantation. -Avoid nephrotoxins. -Keep MAP greater than 65 mmHg. -Dose all medications, considering EGFR of less than 50 mL/min per 1.73 m. -Adequate fluid intake. -Continue to monitor renal function and electrolytes.   Alcohol use > Previous history of alcohol use status post liver transplant x 2 as below. > LFT stable.  -Patient continues to use alcohol. -Patient  counseled to quit alcohol use. -Follow LFTs. -CIWA protocol. -Monitor closely for alcohol withdrawals.     Status post liver transplant x 2 > In 2004 and 2012 at [facility].  Continues to take home tacrolimus. > LFTs stable - Trend LFTs - Continue home tacrolimus   GERD - Continue PPI   Depression - Continue home bupropion   Recent bladder outlet obstruction > Temporarily catheterize during recent admission. - Continue home tamsulosin     DVT prophylaxis: Subcutaneous heparin Code Status: Full code Family Communication:  Disposition Plan: Likely home in the next 1 to 2 days.   Consultants:  None.  Procedures:  None.  Antimicrobials:  None.   Subjective: Patient is a poor historian.  Objective: Vitals:   02/13/23 0605 02/13/23 0711 02/13/23 0900 02/13/23 1130  BP:   123/60   Pulse:   75   Resp:      Temp:  98.2 F (36.8 C)  98 F (36.7 C)  TempSrc:  Oral  Oral  SpO2:      Weight: 60.8 kg     Height:        Intake/Output Summary (Last 24 hours) at 02/13/2023 1144 Last data filed at 02/13/2023 1130 Gross per 24 hour  Intake 3349.06 ml  Output 1350 ml  Net 1999.06 ml   Filed Weights   02/12/23 1527 02/12/23 1944 02/13/23 0605  Weight: 62.5 kg 59.7 kg 60.8 kg    Examination:  General exam: Appears calm and comfortable.  Patient is pale. Respiratory system: Clear to auscultation. Cardiovascular system: S1 & S2 heard Gastrointestinal system: Abdomen is nondistended, soft and nontender.  Central nervous  system: Alert and oriented. No focal neurological deficits. Extremities: No leg edema.  Data Reviewed: I have personally reviewed following labs and imaging studies  CBC: Recent Labs  Lab 02/07/23 1033 02/12/23 1545 02/13/23 0244  WBC 5.2 3.7* 3.3*  NEUTROABS 3.8 2.1  --   HGB 11.6* 10.1* 10.1*  HCT 35.8* 30.7* 31.4*  MCV 101.4* 101.0* 101.3*  PLT 268 172 159   Basic Metabolic Panel: Recent Labs  Lab  02/07/23 1033 02/12/23 1545 02/12/23 1915 02/13/23 0244  NA 137 137  --  139  K 4.3 3.5  --  3.8  CL 103 103  --  107  CO2 26 21*  --  21*  GLUCOSE 84 89  --  86  BUN 20 19  --  16  CREATININE 3.14* 4.63*  --  3.98*  CALCIUM 9.6 8.7*  --  8.8*  MG 1.7  --  1.5*  --   PHOS  --   --  3.7  --    GFR: Estimated Creatinine Clearance: 15.1 mL/min (A) (by C-G formula based on SCr of 3.98 mg/dL (H)). Liver Function Tests: Recent Labs  Lab 02/07/23 1033 02/12/23 1545 02/13/23 0244  AST 31 17 18   ALT 21 14 14   ALKPHOS 88 60 65  BILITOT 0.8 0.6 0.4  PROT 7.4 5.6* 5.5*  ALBUMIN 3.4* 2.6* 2.5*   No results for input(s): "LIPASE", "AMYLASE" in the last 168 hours. No results for input(s): "AMMONIA" in the last 168 hours. Coagulation Profile: No results for input(s): "INR", "PROTIME" in the last 168 hours. Cardiac Enzymes: Recent Labs  Lab 02/07/23 1033  CKTOTAL 168   BNP (last 3 results) No results for input(s): "PROBNP" in the last 8760 hours. HbA1C: No results for input(s): "HGBA1C" in the last 72 hours. CBG: No results for input(s): "GLUCAP" in the last 168 hours. Lipid Profile: No results for input(s): "CHOL", "HDL", "LDLCALC", "TRIG", "CHOLHDL", "LDLDIRECT" in the last 72 hours. Thyroid Function Tests: No results for input(s): "TSH", "T4TOTAL", "FREET4", "T3FREE", "THYROIDAB" in the last 72 hours. Anemia Panel: No results for input(s): "VITAMINB12", "FOLATE", "FERRITIN", "TIBC", "IRON", "RETICCTPCT" in the last 72 hours. Urine analysis:    Component Value Date/Time   COLORURINE YELLOW 02/12/2023 2157   APPEARANCEUR CLEAR 02/12/2023 2157   LABSPEC 1.004 (L) 02/12/2023 2157   PHURINE 6.0 02/12/2023 2157   GLUCOSEU NEGATIVE 02/12/2023 2157   GLUCOSEU NEGATIVE 09/22/2011 1445   HGBUR NEGATIVE 02/12/2023 2157   BILIRUBINUR NEGATIVE 02/12/2023 2157   KETONESUR NEGATIVE 02/12/2023 2157   PROTEINUR NEGATIVE 02/12/2023 2157   UROBILINOGEN 0.2 10/07/2014 1439   NITRITE  NEGATIVE 02/12/2023 2157   LEUKOCYTESUR NEGATIVE 02/12/2023 2157   Sepsis Labs: @LABRCNTIP (procalcitonin:4,lacticidven:4)  ) Recent Results (from the past 240 hour(s))  Culture, blood (routine x 2)     Status: None (Preliminary result)   Collection Time: 02/12/23  7:00 PM   Specimen: BLOOD RIGHT ARM  Result Value Ref Range Status   Specimen Description BLOOD RIGHT ARM  Final   Special Requests   Final    BOTTLES DRAWN AEROBIC AND ANAEROBIC Blood Culture results may not be optimal due to an excessive volume of blood received in culture bottles   Culture   Final    NO GROWTH < 12 HOURS Performed at [facility], [address], [place], [place] [address]    Report Status PENDING  Incomplete  Culture, blood (routine x 2)     Status: None (Preliminary result)   Collection  Time: 02/12/23  7:15 PM   Specimen: BLOOD RIGHT HAND  Result Value Ref Range Status   Specimen Description BLOOD RIGHT HAND  Final   Special Requests   Final    BOTTLES DRAWN AEROBIC ONLY Blood Culture adequate volume   Culture   Final    NO GROWTH < 12 HOURS Performed at [facility], [address], [place], [place] [address]    Report Status PENDING  Incomplete         Radiology Studies: DG Chest Port 1 View  Result Date: 02/12/2023 CLINICAL DATA:  Weakness. EXAM: PORTABLE CHEST 1 VIEW COMPARISON:  10/07/2014 FINDINGS: The cardio pericardial silhouette is enlarged. Lungs are hyperexpanded. The lungs are clear without focal pneumonia, edema, pneumothorax or pleural effusion. No acute bony abnormality. Telemetry leads overlie the chest. IMPRESSION: Enlargement of the cardiopericardial silhouette without overt failure. No acute cardiopulmonary findings. Electronically Signed   By: Kennith Center M.D.   On: 02/12/2023 15:34        Scheduled Meds:  buPROPion  150 mg Oral Daily   feeding supplement  237 mL Oral BID BM   folic acid  1 mg Oral Daily   heparin  5,000 Units Subcutaneous Q8H    multivitamin with minerals  1 tablet Oral Daily   pantoprazole  40 mg Oral Daily   sodium bicarbonate  1,300 mg Oral BID   sodium chloride flush  3 mL Intravenous Q12H   tacrolimus  1 mg Oral BID   tamsulosin  0.4 mg Oral Daily   thiamine  100 mg Oral Daily   Or   thiamine  100 mg Intravenous Daily   Continuous Infusions:   LOS: 0 days    Time spent: 55 minutes.    Berton Mount, MD  [facility] Pager #: [phone] 7PM-7AM contact night coverage as above

## 2023-02-13 NOTE — Evaluation (Signed)
Physical Therapy Evaluation Patient Details Name: [name] MRN: [id number] DOB: [birth year] Today's Date: 02/13/2023  History of Present Illness  72 y.o. male  presenting 02/12/23 with generalized weakness. +resumed drinking alcohol PTA; +AKI PMH significant of alcohol use, liver transplant x 2, thrombocytopenia, anemia, CKD 3B, GERD, depression, spinal stenosis status post surgical intervention, hypertension.  Clinical Impression   Pt admitted secondary to problem above with deficits below. PTA patient was living with sister and girlfriend in a condo. He has 5+5 steps to enter and 15 steps to second floor bedroom/bathroom. He had a fall on the steps (3 months ago per pt, ?accuracy) and injured his back. Noted after session concluded that during his 01/2023 hospitalization he was wearing a TLSO with pt educated on back precautions (he required constant cues to adhere to them). He has no brace and mentioned nothing about needing a brace. Pt currently requires min guard assist to ambulate 160 ft with RW. Anticipate could benefit from HHPT for walker safety education. Anticipate patient will benefit from PT to address problems listed below.Will continue to follow acutely to maximize functional mobility independence and safety.          Recommendations for follow up therapy are one component of a multi-disciplinary discharge planning process, led by the attending physician.  Recommendations may be updated based on patient status, additional functional criteria and insurance authorization.  Follow Up Recommendations Can patient physically be transported by private vehicle: Yes     Assistance Recommended at Discharge Frequent or constant Supervision/Assistance  Patient can return home with the following  A little help with walking and/or transfers;A little help with bathing/dressing/bathroom;Assistance with cooking/housework;Direct supervision/assist for medications management;Direct  supervision/assist for financial management;Assist for transportation;Help with stairs or ramp for entrance    Equipment Recommendations None recommended by PT  Recommendations for Other Services  OT consult    Functional Status Assessment Patient has had a recent decline in their functional status and demonstrates the ability to make significant improvements in function in a reasonable and predictable amount of time.     Precautions / Restrictions Precautions Precautions: Fall;Back Precaution Comments: noted in May '24 pt was wearing TLSO and required total cues to adhere to back precautions Spinal Brace:  (no brace present)      Mobility  Bed Mobility Overal bed mobility: Modified Independent Bed Mobility: Supine to Sit, Sit to Supine     Supine to sit: Modified independent (Device/Increase time), HOB elevated Sit to supine: Modified independent (Device/Increase time)   General bed mobility comments: no physical assist    Transfers Overall transfer level: Needs assistance Equipment used: Rolling walker (2 wheels) Transfers: Sit to/from Stand Sit to Stand: Min assist           General transfer comment: from EOB minguard, from std toilet min assist    Ambulation/Gait Ambulation/Gait assistance: Min guard Gait Distance (Feet): 160 Feet Assistive device: Rolling walker (2 wheels) Gait Pattern/deviations: Step-through pattern, Trunk flexed, Wide base of support   Gait velocity interpretation: 1.31 - 2.62 ft/sec, indicative of limited community ambulator   General Gait Details: pushes RW too far ahead and nearly kicks back legs of RW with his feet; when turning his left foot ended up outside the walker and required cues to notice this  Stairs            Wheelchair Mobility    Modified Rankin (Stroke Patients Only)       Balance Overall balance assessment: Needs assistance, History of Falls (  h/o ETOH abuse) Sitting-balance support: No upper extremity  supported, Feet supported Sitting balance-Leahy Scale: Fair     Standing balance support: Bilateral upper extremity supported, Reliant on assistive device for balance Standing balance-Leahy Scale: Poor                               Pertinent Vitals/Pain Pain Assessment Pain Assessment: No/denies pain    Home Living Family/patient expects to be discharged to:: Private residence Living Arrangements: Other relatives;Non-relatives/Friends (sister, girlfriend) Available Help at Discharge: Family;Available 24 hours/day Type of Home: House Home Access: Stairs to enter Entrance Stairs-Rails: Right;Left (first 5 has bil rails; second 5 has one rail) Entrance Stairs-Number of Steps: 5+5 Alternate Level Stairs-Number of Steps: 15 Home Layout: Two level;Bed/bath upstairs Home Equipment: Rolling Walker (2 wheels);Standard Walker;Cane - single point;Grab bars - tub/shower Additional Comments: pt ?reliability as some of these answers do not match prior admissions information    Prior Function Prior Level of Function : Patient poor historian/Family not available             Mobility Comments: reports fell on steps ~3 months ago with broken vertebra; using RW "off and on" depending on back pain ADLs Comments: has not been in shower since he fell on the steps     Hand Dominance        Extremity/Trunk Assessment   Upper Extremity Assessment Upper Extremity Assessment: Generalized weakness    Lower Extremity Assessment Lower Extremity Assessment: Generalized weakness    Cervical / Trunk Assessment Cervical / Trunk Assessment: Kyphotic  Communication   Communication: No difficulties  Cognition Arousal/Alertness: Awake/alert Behavior During Therapy: WFL for tasks assessed/performed Overall Cognitive Status: Impaired/Different from baseline Area of Impairment: Orientation, Safety/judgement, Awareness                 Orientation Level: Disoriented to, Place  [facility] hospital)       Safety/Judgement: Decreased awareness of deficits, Decreased awareness of safety Awareness: Anticipatory   General Comments: several times made comments that indicated he didn't know where he was (thought [place]); anticipated needing to use bathroom on the way back from the walk        General Comments General comments (skin integrity, edema, etc.): on room air; sat monitor working only some of the time with sats 93%; HR 107 with ambulation    Exercises     Assessment/Plan    PT Assessment Patient needs continued PT services  PT Problem List Decreased strength;Decreased balance;Decreased mobility;Decreased cognition;Decreased knowledge of use of DME;Decreased safety awareness;Decreased knowledge of precautions;Pain       PT Treatment Interventions DME instruction;Gait training;Functional mobility training;Therapeutic activities;Therapeutic exercise;Balance training;Patient/family education;Stair training;Cognitive remediation    PT Goals (Current goals can be found in the Care Plan section)  Acute Rehab PT Goals Patient Stated Goal: be able to go home PT Goal Formulation: With patient Time For Goal Achievement: 02/27/23 Potential to Achieve Goals: Good    Frequency Min 1X/week     Co-evaluation               AM-PAC PT "6 Clicks" Mobility  Outcome Measure Help needed turning from your back to your side while in a flat bed without using bedrails?: None Help needed moving from lying on your back to sitting on the side of a flat bed without using bedrails?: None Help needed moving to and from a bed to a chair (including a wheelchair)?: A Little Help  needed standing up from a chair using your arms (e.g., wheelchair or bedside chair)?: A Little Help needed to walk in hospital room?: A Little Help needed climbing 3-5 steps with a railing? : A Little 6 Click Score: 20    End of Session Equipment Utilized During Treatment: Gait  belt Activity Tolerance: Patient tolerated treatment well Patient left: in bed;with call bell/phone within reach;with bed alarm set Nurse Communication: Mobility status PT Visit Diagnosis: Other abnormalities of gait and mobility (R26.89);Muscle weakness (generalized) (M62.81);History of falling (Z91.81)    Time: 1416-1450 PT Time Calculation (min) (ACUTE ONLY): 34 min   Charges:   PT Evaluation $PT Eval Low Complexity: 1 Low PT Treatments $Gait Training: 8-22 mins         Jerolyn Center, PT Acute Rehabilitation Services  Office [phone]   Zena Amos 02/13/2023, 3:11 PM

## 2023-02-14 DIAGNOSIS — N178 Other acute kidney failure: Secondary | ICD-10-CM | POA: Diagnosis not present

## 2023-02-14 DIAGNOSIS — N1832 Chronic kidney disease, stage 3b: Secondary | ICD-10-CM | POA: Diagnosis not present

## 2023-02-14 LAB — CULTURE, BLOOD (ROUTINE X 2): Culture: NO GROWTH

## 2023-02-14 MED ORDER — VITAMIN B-1 100 MG PO TABS: 100.0000 mg | ORAL_TABLET | Freq: Every day | ORAL | 0 refills | Status: AC

## 2023-02-14 MED ORDER — FOLIC ACID 1 MG PO TABS: 1.0000 mg | ORAL_TABLET | Freq: Every day | ORAL | 0 refills | Status: AC

## 2023-02-14 MED ORDER — SODIUM BICARBONATE 650 MG PO TABS: 1300.0000 mg | ORAL_TABLET | Freq: Two times a day (BID) | ORAL | 0 refills | Status: AC

## 2023-02-14 MED ORDER — ENSURE ENLIVE PO LIQD: 237.0000 mL | Freq: Two times a day (BID) | ORAL | 12 refills | Status: AC

## 2023-02-14 NOTE — TOC Initial Note (Addendum)
Transition of Care [facility]) - Initial/Assessment Note    Patient Details  Name: [name] MRN: [id number] Date of Birth: 1950/11/22  Transition of Care [facility] CM/SW Contact:    Ronny Bacon, RN Phone Number: 02/14/2023, 11:38 AM  Clinical Narrative: Spoke with patient at bedside. Patient reports, retired, lives in a house in [place] with his sister and travels to [place], via personal car, to visit his Girlfriend every 3 months. Patient plans to return to [place] on 02/22/2023, via personal vehicle. He is unsure if girlfriend plans to travel with him. PCP is Dr. Scarlette Ar in [place] and he uses the [facility] in [place]. Girlfriend will transport patient home at discharge.    Patient confirmed address in [place] is [address] [place], [place] [address].      Amy from [place] confirmed that patient is current with their service and they can follow him when discharged to see if they can provide services in [place].       Expected Discharge Plan: Home w Home Health Services Barriers to Discharge: Continued Medical Work up   Patient Goals and CMS Choice Patient states their goals for this hospitalization and ongoing recovery are:: To go back to [place]          Expected Discharge Plan and Services   Discharge Planning Services: CM Consult   Living arrangements for the past 2 months: Apartment                                      Prior Living Arrangements/Services Living arrangements for the past 2 months: Apartment Lives with:: Self Patient language and need for interpreter reviewed:: Yes Do you feel safe going back to the place where you live?: Yes      Need for Family Participation in Patient Care: No (Comment) Care giver support system in place?: Yes (comment) Current home services: DME (RW, [facility]) Criminal Activity/Legal Involvement Pertinent to Current Situation/Hospitalization: No - Comment as needed  Activities of Daily Living Home Assistive  Devices/Equipment: Cane (specify quad or straight), Eyeglasses, Walker (specify type) (states he does not use the cane or walker) ADL Screening (condition at time of admission) Patient's cognitive ability adequate to safely complete daily activities?: Yes Is the patient deaf or have difficulty hearing?: No Does the patient have difficulty seeing, even when wearing glasses/contacts?: No Does the patient have difficulty concentrating, remembering, or making decisions?: Yes Patient able to express need for assistance with ADLs?: No Does the patient have difficulty dressing or bathing?: Yes Independently performs ADLs?: Yes (appropriate for developmental age) Does the patient have difficulty walking or climbing stairs?: Yes Weakness of Legs: Both Weakness of Arms/Hands: Both  Permission Sought/Granted                  Emotional Assessment Appearance:: Appears stated age Attitude/Demeanor/Rapport: Engaged Affect (typically observed): Appropriate Orientation: : Oriented to Self, Oriented to Place, Oriented to  Time Alcohol / Substance Use: Alcohol Use Psych Involvement: No (comment)  Admission diagnosis:  Weakness [R53.1] AKI (acute kidney injury) (HCC) [N17.9] Alcoholic intoxication without complication (HCC) [F10.920] Acute renal failure superimposed on stage 3b chronic kidney disease (HCC) [N17.9, N18.32] Acute kidney injury superimposed on chronic kidney disease (HCC) [N17.9, N18.9] Patient Active Problem List   Diagnosis Date Noted   Acute kidney injury superimposed on chronic kidney disease (HCC) 02/13/2023   Acute renal failure superimposed on stage 3b chronic kidney  disease (HCC) 02/12/2023   CKD (chronic kidney disease) stage 3, GFR 30-59 ml/min (HCC) 01/22/2023   AKI (acute kidney injury) (HCC) 01/22/2023   SVT (supraventricular tachycardia) 01/22/2023   Nondisplaced fracture of base of third metacarpal bone, right hand, initial encounter for closed fracture 01/22/2023    Spinal stenosis of lumbar region at multiple levels 01/22/2023   Hypertension 05/04/2011   History of liver transplant (HCC) 01/11/2011   ANEMIA, IRON DEFICIENCY 10/16/2010   DERMATITIS 10/16/2010   COLONIC POLYPS, HX OF 10/16/2010   THROMBOCYTOPENIA 10/14/2010   ALCOHOL ABUSE, HX OF 10/14/2010   PCP:  Oneita Hurt, No Pharmacy:   [facility] [address], [place] - [address] [place] [address] Phone: [phone] Fax: [phone]     Social Determinants of Health (SDOH) Social History: SDOH Screenings   Food Insecurity: No Food Insecurity (02/12/2023)  Housing: Low Risk  (02/12/2023)  Transportation Needs: No Transportation Needs (02/13/2023)  Utilities: Not At Risk (02/12/2023)  Tobacco Use: Low Risk  (02/12/2023)   SDOH Interventions:     Readmission Risk Interventions    01/27/2023    2:01 PM  Readmission Risk Prevention Plan  Post Dischage Appt Complete  Medication Screening Complete  Transportation Screening Complete

## 2023-02-14 NOTE — Discharge Summary (Incomplete)
Physician Discharge Summary  Patient ID: [name] MRN: [id number] DOB/AGE: 70-Mar-1952 70 y.o.  Admit date: 02/12/2023 Discharge date: 02/14/2023  Admission Diagnoses:  Discharge Diagnoses:  Principal Problem:   Acute renal failure superimposed on stage 3b chronic kidney disease (HCC) Active Problems:   ANEMIA, IRON DEFICIENCY   History of liver transplant (HCC)   ALCOHOL ABUSE, HX OF   Hypertension   CKD (chronic kidney disease) stage 3, GFR 30-59 ml/min (HCC)   Acute kidney injury superimposed on chronic kidney disease (HCC) Lactic acidosis Metabolic acidosis Hypomagnesemia.   Discharged Condition: stable  Hospital Course:  Patient is a 72 year old male past medical history significant for alcohol abuse, status post liver transplantation on 2 occasions, thrombocytopenia, anemia, chronic kidney disease stage IIIb, GERD, depression, hypertension and spinal stenosis.  Patient continues to drink alcohol.  Patient was admitted with acute kidney injury on chronic kidney disease, weakness and daily alcohol use.  Patient is a poor historian.  Patient was recently managed on an inpatient basis for altered mental status, falls, electrolyte abnormalities and acute kidney injury.  Patient was managed supportively during the hospital stay.  Patient was adequately hydrated.  Electrolytes were monitored and corrected during the hospital stay.  Lactic acid improved from 2.2-1 during the hospital stay.  Serum creatinine improved during the hospital stay as well.  Magnesium was noted to be 1.5.  Patient is known to kidney transplantation team in [place] state [place]  Patient will follow-up with the renal transplantation team on discharge.  Patient has been optimized.  Patient will also follow-up with her primary care provider on discharge.  No withdrawal symptoms noted during the hospital stay.   AKI on CKD 3B Hypotension -Patient has baseline chronic kidney disease  stage IIIb. -Patient presented with generalized weakness found to have AKI with creatinine elevated to 4.63 from baseline of 2. -Patient tells me that he is currently being worked up for possible kidney transplantation. -Avoid nephrotoxins. -Keep MAP greater than 65 mmHg. -Dose all medications, considering EGFR of less than 50 mL/min per 1.73 m. -Adequate fluid intake. -Continue to monitor renal function and electrolytes.   Alcohol use > Previous history of alcohol use status post liver transplant x 2 as below. > LFT stable.  -Patient continues to use alcohol. -Patient counseled to quit alcohol use. -Follow LFTs. -CIWA protocol. -Monitor closely for alcohol withdrawals.     Status post liver transplant x 2 > In 2004 and 2012 at [facility].  Continues to take home tacrolimus. > LFTs stable - Trend LFTs - Continue home tacrolimus   GERD - Continue PPI   Depression - Continue home bupropion   Recent bladder outlet obstruction > Temporarily catheterize during recent admission. - Continue home tamsulosin  Lactic acidosis/lactic and metabolic acidosis.: -Likely secondary to hypotension and volume depletion. -Lactic acidosis improved with hydration and resolution of hypotension.  Hypomagnesemia: -Magnesium of 1.5 prior to discharge. -Patient is an alcoholic. -Ensure adequate diet. -Monitor and continue to replete magnesium on discharge.    Consults: None  Significant Diagnostic Studies:  -Baseline serum creatinine of 2-3.  Admitted.  Serum creatinine of 4.63, improved to 3.98 prior to discharge.  -Magnesium of 1.5 prior to discharge.  -Lactic acid of 2.2 on presentation, improved to 1.  Discharge Exam: Blood pressure 137/87, pulse 93, temperature 98.5 F (36.9 C), temperature source Oral, resp. rate 16, height 5\' 8"  (1.727 m), weight 62.1 kg, SpO2 93 %.   Disposition:  Discharge disposition: 06-Home-Health Care  Svc       Discharge Instructions     Diet - low sodium heart healthy   Complete by: As directed    Increase activity slowly   Complete by: As directed       Allergies as of 02/14/2023       Reactions   Tape Other (See Comments)   SKIN TEARS VERY EASILY!!!!! Can tolerate ONLY paper tape.   Zolpidem Other (See Comments)   Confusion   Moxifloxacin Rash, Other (See Comments)   Achilles tendon pain also        Medication List     TAKE these medications    ascorbic acid 500 MG tablet Commonly known as: VITAMIN C Take 500 mg by mouth daily.   buPROPion 150 MG 12 hr tablet Commonly known as: WELLBUTRIN SR Take 1 tablet (150 mg total) by mouth daily. What changed: when to take this   CITRACAL CALCIUM+D PO Take 1 tablet by mouth daily.   famotidine 40 MG tablet Commonly known as: PEPCID Take 40 mg by mouth at bedtime.   feeding supplement Liqd Take 237 mLs by mouth 2 (two) times daily between meals.   ferrous sulfate 325 (65 FE) MG tablet Take 1 tablet (325 mg total) by mouth daily with breakfast.   folic acid 1 MG tablet Commonly known as: FOLVITE Take 1 tablet (1 mg total) by mouth daily. Start taking on: February 15, 2023   Magnesium Oxide 400 MG Caps Take 400 mg by mouth daily as needed (cramps).   metoprolol tartrate 50 MG tablet Commonly known as: LOPRESSOR Take 1 tablet (50 mg total) by mouth 2 (two) times daily.   multivitamin tablet Take 1 tablet by mouth daily with breakfast.   pantoprazole 40 MG tablet Commonly known as: PROTONIX Take 1 tablet (40 mg total) by mouth daily.   senna-docusate 8.6-50 MG tablet Commonly known as: Senokot-S Take 1 tablet by mouth daily.   sodium bicarbonate 650 MG tablet Take 2 tablets (1,300 mg total) by mouth 2 (two) times daily. What changed: when to take this   tacrolimus 1 MG capsule Commonly known as: PROGRAF Take 1 mg by mouth in the morning and at bedtime.   tamsulosin 0.4 MG Caps capsule Commonly  known as: FLOMAX Take 0.4 mg by mouth daily.   thiamine 100 MG tablet Commonly known as: Vitamin B-1 Take 1 tablet (100 mg total) by mouth daily. Start taking on: February 15, 2023   traZODone 100 MG tablet Commonly known as: DESYREL Take 100 mg by mouth at bedtime as needed for sleep.        Follow-up Information     [facility] Follow up.   Why: Physical Therapy and Occupational Therapy. Call for follow up when you are discharged. Contact information: [address] [place] [address] [phone]                 Time spent: 35 minutes.  SignedBarnetta Chapel 02/14/2023, 3:04 PM

## 2023-02-14 NOTE — Evaluation (Signed)
Occupational Therapy Evaluation Patient Details Name: [name] MRN: [id number] DOB: [birth year] Today's Date: 02/14/2023   History of Present Illness 72 y.o. male  presenting 02/12/23 with generalized weakness. +resumed drinking alcohol PTA; +AKI PMH significant of alcohol use, liver transplant x 2, thrombocytopenia, anemia, CKD 3B, GERD, depression, spinal stenosis status post surgical intervention, hypertension.   Clinical Impression      Pt reports independence at baseline with ADLs and functional mobility, lives with relatives who can assist at d/c. Pt currently with decreased awareness of situation and needs min cues for safety, needing set up - mod A for ADLs, mod I for bed mobility, and min guard for transfers with RW. Pt presenting with impairments listed below, will follow acutely. Recommend HHOT at d/c.    Recommendations for follow up therapy are one component of a multi-disciplinary discharge planning process, led by the attending physician.  Recommendations may be updated based on patient status, additional functional criteria and insurance authorization.   Assistance Recommended at Discharge Frequent or constant Supervision/Assistance  Patient can return home with the following A lot of help with bathing/dressing/bathroom;Assistance with cooking/housework;Direct supervision/assist for medications management;Direct supervision/assist for financial management;Assist for transportation;Help with stairs or ramp for entrance;A little help with walking and/or transfers    Functional Status Assessment  Patient has had a recent decline in their functional status and demonstrates the ability to make significant improvements in function in a reasonable and predictable amount of time.  Equipment Recommendations  BSC/3in1    Recommendations for Other Services PT consult     Precautions / Restrictions Precautions Precautions: Fall;Back Precaution Comments: noted in May '24 pt was  wearing TLSO, however pt reports he does not wear back brace at home Restrictions Weight Bearing Restrictions: No      Mobility Bed Mobility Overal bed mobility: Modified Independent                  Transfers Overall transfer level: Needs assistance Equipment used: Rolling walker (2 wheels) Transfers: Sit to/from Stand Sit to Stand: Min guard                  Balance Overall balance assessment: Needs assistance Sitting-balance support: No upper extremity supported, Feet supported Sitting balance-Leahy Scale: Good Sitting balance - Comments: sits EOB without LOB   Standing balance support: Bilateral upper extremity supported, Reliant on assistive device for balance Standing balance-Leahy Scale: Poor                             ADL either performed or assessed with clinical judgement   ADL Overall ADL's : Needs assistance/impaired Eating/Feeding: Set up;Sitting   Grooming: Set up;Sitting   Upper Body Bathing: Minimal assistance;Sitting   Lower Body Bathing: Moderate assistance;Sitting/lateral leans   Upper Body Dressing : Minimal assistance;Sitting   Lower Body Dressing: Moderate assistance;Sitting/lateral leans   Toilet Transfer: Min guard;Rolling walker (2 wheels);Ambulation;Regular Social worker and Hygiene: Min guard       Functional mobility during ADLs: Min guard;Rolling walker (2 wheels)       Vision   Vision Assessment?: No apparent visual deficits     Perception Perception Perception Tested?: No   Praxis Praxis Praxis tested?: Not tested    Pertinent Vitals/Pain Pain Assessment Pain Assessment: No/denies pain     Hand Dominance Right   Extremity/Trunk Assessment Upper Extremity Assessment Upper Extremity Assessment: Generalized weakness   Lower Extremity Assessment Lower Extremity  Assessment: Generalized weakness   Cervical / Trunk Assessment Cervical / Trunk Assessment:  Kyphotic   Communication Communication Communication: No difficulties   Cognition Arousal/Alertness: Awake/alert Behavior During Therapy: WFL for tasks assessed/performed Overall Cognitive Status: Impaired/Different from baseline Area of Impairment: Orientation, Safety/judgement, Awareness                 Orientation Level: Situation         Awareness: Anticipatory         General Comments  HR up to 142 with short distance ambuation to bathroom, quickly decreased to 110-120    Exercises     Shoulder Instructions      Home Living Family/patient expects to be discharged to:: Private residence Living Arrangements: Other relatives;Non-relatives/Friends Available Help at Discharge: Family;Available 24 hours/day Type of Home: House Home Access: Stairs to enter [employer] of Steps: 5+5 Entrance Stairs-Rails: Right;Left Home Layout: Two level;Bed/bath upstairs Alternate Level Stairs-Number of Steps: 15 Alternate Level Stairs-Rails: Right;Left;Can reach both Bathroom Shower/Tub: Chief Strategy Officer: Handicapped height Bathroom Accessibility: Yes   Home Equipment: Agricultural consultant (2 wheels);Standard Walker;Cane - single point;Grab bars - tub/shower;BSC/3in1          Prior Functioning/Environment Prior Level of Function : Patient poor historian/Family not available             Mobility Comments: reports on AD use ADLs Comments: reports he is ind        OT Problem List: Decreased range of motion;Decreased strength;Decreased activity tolerance;Impaired balance (sitting and/or standing);Decreased cognition;Decreased safety awareness;Cardiopulmonary status limiting activity;Decreased knowledge of precautions;Decreased coordination;Impaired UE functional use      OT Treatment/Interventions: Therapeutic exercise;Self-care/ADL training;Energy conservation;DME and/or AE instruction;Therapeutic activities;Patient/family education;Balance  training;Cognitive remediation/compensation;Visual/perceptual remediation/compensation    OT Goals(Current goals can be found in the care plan section) Acute Rehab OT Goals Patient Stated Goal: none stated OT Goal Formulation: With patient Time For Goal Achievement: 02/28/23 Potential to Achieve Goals: Good ADL Goals Pt Will Perform Upper Body Dressing: with supervision;sitting Pt Will Perform Lower Body Dressing: with supervision;sitting/lateral leans;sit to/from stand Pt Will Transfer to Toilet: with supervision;ambulating;regular height toilet Pt Will Perform Tub/Shower Transfer: Tub transfer;Shower transfer;with supervision;ambulating;shower seat  OT Frequency: Min 1X/week    Co-evaluation              AM-PAC OT "6 Clicks" Daily Activity     Outcome Measure Help from another person eating meals?: None Help from another person taking care of personal grooming?: A Little Help from another person toileting, which includes using toliet, bedpan, or urinal?: A Little Help from another person bathing (including washing, rinsing, drying)?: A Lot Help from another person to put on and taking off regular upper body clothing?: A Little Help from another person to put on and taking off regular lower body clothing?: A Lot 6 Click Score: 17   End of Session Equipment Utilized During Treatment: Gait belt;Rolling walker (2 wheels) Nurse Communication: Mobility status  Activity Tolerance: Patient tolerated treatment well Patient left: in chair;with call bell/phone within reach;with chair alarm set  OT Visit Diagnosis: Unsteadiness on feet (R26.81);Other abnormalities of gait and mobility (R26.89);Muscle weakness (generalized) (M62.81);History of falling (Z91.81);Repeated falls (R29.6)                Time: 1610-9604 OT Time Calculation (min): 21 min Charges:  OT General Charges $OT Visit: 1 Visit OT Evaluation $OT Eval Moderate Complexity: 1 Mod  Amie Cowens K, OTD, OTR/L SecureChat  Preferred Acute Rehab [phone]  Dalphine Handing 02/14/2023, 10:45 AM

## 2023-02-15 LAB — CULTURE, BLOOD (ROUTINE X 2)

## 2023-02-17 LAB — CULTURE, BLOOD (ROUTINE X 2): Culture: NO GROWTH

## 2023-03-29 ENCOUNTER — Other Ambulatory Visit: Payer: Self-pay | Admitting: Internal Medicine

## 2023-11-14 ENCOUNTER — Emergency Department (HOSPITAL_COMMUNITY): Payer: Medicare (Managed Care)

## 2023-11-14 ENCOUNTER — Emergency Department (HOSPITAL_COMMUNITY)
Admission: EM | Admit: 2023-11-14 | Discharge: 2023-11-15 | Disposition: A | Discharge status: Home / Self Care | Payer: Medicare (Managed Care) | Attending: Emergency Medicine | Admitting: Emergency Medicine

## 2023-11-14 DIAGNOSIS — W01198A Fall on same level from slipping, tripping and stumbling with subsequent striking against other object, initial encounter: Secondary | ICD-10-CM | POA: Diagnosis not present

## 2023-11-14 DIAGNOSIS — S32409A Unspecified fracture of unspecified acetabulum, initial encounter for closed fracture: Secondary | ICD-10-CM | POA: Insufficient documentation

## 2023-11-14 DIAGNOSIS — M25551 Pain in right hip: Secondary | ICD-10-CM | POA: Diagnosis present

## 2023-11-14 DIAGNOSIS — W19XXXA Unspecified fall, initial encounter: Secondary | ICD-10-CM

## 2023-11-14 DIAGNOSIS — S32591A Other specified fracture of right pubis, initial encounter for closed fracture: Secondary | ICD-10-CM | POA: Insufficient documentation

## 2023-11-14 DIAGNOSIS — S32401A Unspecified fracture of right acetabulum, initial encounter for closed fracture: Secondary | ICD-10-CM

## 2023-11-14 LAB — URINALYSIS, ROUTINE W REFLEX MICROSCOPIC
Bilirubin Urine: NEGATIVE
Glucose, UA: NEGATIVE mg/dL
Hgb urine dipstick: NEGATIVE
Ketones, ur: NEGATIVE mg/dL
Leukocytes,Ua: NEGATIVE
Nitrite: NEGATIVE
Protein, ur: NEGATIVE mg/dL
Specific Gravity, Urine: 1.008 (ref 1.005–1.030)
pH: 5 (ref 5.0–8.0)

## 2023-11-14 LAB — CBC WITH DIFFERENTIAL/PLATELET
Abs Immature Granulocytes: 0.09 10*3/uL — ABNORMAL HIGH (ref 0.00–0.07)
Basophils Absolute: 0 10*3/uL (ref 0.0–0.1)
Basophils Relative: 0 %
Eosinophils Absolute: 0.1 10*3/uL (ref 0.0–0.5)
Eosinophils Relative: 2 %
HCT: 29.9 % — ABNORMAL LOW (ref 39.0–52.0)
Hemoglobin: 9.6 g/dL — ABNORMAL LOW (ref 13.0–17.0)
Immature Granulocytes: 2 %
Lymphocytes Relative: 17 %
Lymphs Abs: 0.7 10*3/uL (ref 0.7–4.0)
MCH: 32.9 pg (ref 26.0–34.0)
MCHC: 32.1 g/dL (ref 30.0–36.0)
MCV: 102.4 fL — ABNORMAL HIGH (ref 80.0–100.0)
Monocytes Absolute: 0.6 10*3/uL (ref 0.1–1.0)
Monocytes Relative: 16 %
Neutro Abs: 2.5 10*3/uL (ref 1.7–7.7)
Neutrophils Relative %: 63 %
Platelets: 225 10*3/uL (ref 150–400)
RBC: 2.92 MIL/uL — ABNORMAL LOW (ref 4.22–5.81)
RDW: 14.4 % (ref 11.5–15.5)
WBC: 3.9 10*3/uL — ABNORMAL LOW (ref 4.0–10.5)
nRBC: 0 % (ref 0.0–0.2)

## 2023-11-14 LAB — BASIC METABOLIC PANEL
Anion gap: 8 (ref 5–15)
BUN: 33 mg/dL — ABNORMAL HIGH (ref 8–23)
CO2: 21 mmol/L — ABNORMAL LOW (ref 22–32)
Calcium: 9.2 mg/dL (ref 8.9–10.3)
Chloride: 103 mmol/L (ref 98–111)
Creatinine, Ser: 2.36 mg/dL — ABNORMAL HIGH (ref 0.61–1.24)
GFR, Estimated: 29 mL/min — ABNORMAL LOW (ref >60)
Glucose, Bld: 102 mg/dL — ABNORMAL HIGH (ref 70–99)
Potassium: 4.5 mmol/L (ref 3.5–5.1)
Sodium: 132 mmol/L — ABNORMAL LOW (ref 135–145)

## 2023-11-14 MED ORDER — FAMOTIDINE 20 MG PO TABS
40.0000 mg | ORAL_TABLET | Freq: Every day | ORAL | Status: DC
  Administered 2023-11-14: 40 mg via ORAL
  Filled 2023-11-14: qty 2

## 2023-11-14 MED ORDER — METOPROLOL TARTRATE 25 MG PO TABS
50.0000 mg | ORAL_TABLET | Freq: Two times a day (BID) | ORAL | Status: DC
  Administered 2023-11-14 – 2023-11-15 (×2): 50 mg via ORAL
  Filled 2023-11-14 (×2): qty 2

## 2023-11-14 MED ORDER — TACROLIMUS 1 MG PO CAPS: 1.0000 mg | ORAL_CAPSULE | Freq: Two times a day (BID) | ORAL | Status: DC

## 2023-11-14 MED ORDER — TRAZODONE HCL 100 MG PO TABS: 100.0000 mg | ORAL_TABLET | Freq: Every evening | ORAL | Status: DC | PRN

## 2023-11-14 MED ORDER — BUPROPION HCL ER (SR) 150 MG PO TB12
150.0000 mg | ORAL_TABLET | Freq: Two times a day (BID) | ORAL | Status: DC
  Administered 2023-11-14 – 2023-11-15 (×2): 150 mg via ORAL
  Filled 2023-11-14 (×2): qty 1

## 2023-11-14 MED ORDER — TACROLIMUS 1 MG PO CAPS
1.0000 mg | ORAL_CAPSULE | Freq: Two times a day (BID) | ORAL | Status: DC
  Administered 2023-11-15 (×2): 1 mg via ORAL
  Filled 2023-11-14 (×2): qty 1

## 2023-11-14 MED ORDER — OXYCODONE-ACETAMINOPHEN 5-325 MG PO TABS: 1.0000 | ORAL_TABLET | Freq: Four times a day (QID) | ORAL | 0 refills | Status: DC | PRN

## 2023-11-14 MED ORDER — OXYCODONE-ACETAMINOPHEN 5-325 MG PO TABS: 1.0000 | ORAL_TABLET | Freq: Four times a day (QID) | ORAL | Status: DC | PRN

## 2023-11-14 MED ORDER — TAMSULOSIN HCL 0.4 MG PO CAPS
0.4000 mg | ORAL_CAPSULE | Freq: Every day | ORAL | Status: DC
Start: 2023-11-15 — End: 2023-11-15
  Administered 2023-11-15: 0.4 mg via ORAL
  Filled 2023-11-14: qty 1

## 2023-11-14 NOTE — Discharge Instructions (Signed)
 You are seen in the emergency department for right hip pain after a fall.  You had x-rays and a CAT scan of your pelvis.  You had areas of fracture of the right pelvis.  Orthopedics is recommending weightbearing as tolerated.  We are providing you with a walker and a prescription for pain medicine.  Please schedule follow-up appointment with orthopedics and your primary care doctor.  Return to the emergency department if any worsening or concerning symptoms

## 2023-11-14 NOTE — ED Provider Notes (Signed)
 [place] EMERGENCY DEPARTMENT AT [facility] Provider Note   CSN: [id number] Arrival date & time: 11/14/23  1233     History {Add pertinent medical, surgical, social history, OB history to HPI:1} Chief Complaint  Patient presents with   [name] is a 73 y.o. male.  Lives at home independently.  He had a slip and fall about a week ago in which he injured his right shoulder and right hip.  Since then his right leg has been very weak and will not hold him up and he is having difficulty ambulating.  Feels like he has to pick up his leg.  No head or neck pain no chest pain or shortness of breath.  The history is provided by the patient.  Fall This is a new problem. The current episode started more than 1 week ago. The problem has not changed since onset.Pertinent negatives include no chest pain, no abdominal pain, no headaches and no shortness of breath. The symptoms are aggravated by bending and intercourse. Nothing relieves the symptoms. He has tried rest for the symptoms. The treatment provided no relief.       Home Medications Prior to Admission medications   Medication Sig Start Date End Date Taking? Authorizing Provider  Ascorbic Acid (VITAMIN C) 500 MG tablet Take 500 mg by mouth daily.      [provider]  buPROPion (WELLBUTRIN SR) 150 MG 12 hr tablet Take 1 tablet (150 mg total) by mouth daily. Patient taking differently: Take 150 mg by mouth 2 (two) times daily. 01/27/23   Lorin Glass, MD  Calcium-Magnesium-Vitamin D (CITRACAL CALCIUM+D PO) Take 1 tablet by mouth daily.    [provider]  famotidine (PEPCID) 40 MG tablet Take 40 mg by mouth at bedtime.    [provider]  feeding supplement (ENSURE ENLIVE / ENSURE PLUS) LIQD Take 237 mLs by mouth 2 (two) times daily between meals. 02/14/23   Berton Mount I, MD  ferrous sulfate 325 (65 FE) MG tablet Take 1 tablet (325 mg total) by mouth daily with breakfast. 01/28/23  04/28/23  Lorin Glass, MD  Magnesium Oxide 400 MG CAPS Take 400 mg by mouth daily as needed (cramps).    [provider]  metoprolol (LOPRESSOR) 50 MG tablet Take 1 tablet (50 mg total) by mouth 2 (two) times daily. 05/04/11   Newt Lukes, MD  Multiple Vitamin (MULTIVITAMIN) tablet Take 1 tablet by mouth daily with breakfast.    [provider]  pantoprazole (PROTONIX) 40 MG tablet Take 1 tablet (40 mg total) by mouth daily. 01/28/23 04/28/23  Lorin Glass, MD  sodium bicarbonate 650 MG tablet Take 2 tablets (1,300 mg total) by mouth 2 (two) times daily. 02/14/23 03/16/23  Berton Mount I, MD  tacrolimus (PROGRAF) 1 MG capsule Take 1 mg by mouth in the morning and at bedtime.    [provider]  tamsulosin (FLOMAX) 0.4 MG CAPS capsule Take 0.4 mg by mouth daily.    [provider]  traZODone (DESYREL) 100 MG tablet Take 100 mg by mouth at bedtime as needed for sleep.    [provider]      Allergies    Tape, Zolpidem, and Moxifloxacin    Review of Systems   Review of Systems  Respiratory:  Negative for shortness of breath.   Cardiovascular:  Negative for chest pain.  Gastrointestinal:  Negative for abdominal pain.  Neurological:  Negative for headaches.    Physical  Exam Updated Vital Signs BP 121/79 (BP Location: Right Arm)   Pulse 61   Temp 97.8 F (36.6 C) (Oral)   Resp 17   SpO2 100%  Physical Exam Vitals and nursing note reviewed.  Constitutional:      General: He is not in acute distress.    Appearance: Normal appearance. He is well-developed.  HENT:     Head: Normocephalic and atraumatic.  Eyes:     Conjunctiva/sclera: Conjunctivae normal.  Cardiovascular:     Rate and Rhythm: Normal rate and regular rhythm.     Heart sounds: No murmur heard. Pulmonary:     Effort: Pulmonary effort is normal. No respiratory distress.     Breath sounds: Normal breath sounds.  Abdominal:     Palpations: Abdomen is soft.      Tenderness: There is no abdominal tenderness. There is no guarding or rebound.  Musculoskeletal:        General: No deformity. Normal range of motion.     Cervical back: Neck supple.  Skin:    General: Skin is warm and dry.     Capillary Refill: Capillary refill takes less than 2 seconds.  Neurological:     Mental Status: He is alert.     Motor: Weakness (He has 3 out of 5 weakness of his proximal right leg.  Distal strength and sensation preserved.) present.     ED Results / Procedures / Treatments   Labs (all labs ordered are listed, but only abnormal results are displayed) Labs Reviewed - No data to display  EKG None  Radiology DG Shoulder Right Result Date: 11/14/2023 CLINICAL DATA:  Pain after fall. EXAM: RIGHT SHOULDER - 2+ VIEW COMPARISON:  01/22/2023 FINDINGS: There is no evidence of fracture or dislocation. Minor acromioclavicular and glenohumeral degenerative spurring. No erosions. Soft tissues are unremarkable. IMPRESSION: 1. No fracture or dislocation of the right shoulder. 2. Minor acromioclavicular and glenohumeral degenerative spurring. Electronically Signed   By: Narda Rutherford M.D.   On: 11/14/2023 15:43   DG Hip Unilat  With Pelvis 2-3 Views Right Result Date: 11/14/2023 CLINICAL DATA:  Pain after fall. EXAM: DG HIP (WITH OR WITHOUT PELVIS) 2-3V RIGHT COMPARISON:  Radiograph 12/29/2021 FINDINGS: Cortical irregularity about the right inferior ramus may represent a nondisplaced fracture. No other fracture of the pelvis or right hip. Mild right hip joint space narrowing with acetabular spurring. The femoral head is seated, no dislocation. Pubic symphysis and sacroiliac joints are congruent. Mild sacroiliac degenerative change. IMPRESSION: 1. Cortical irregularity about the right inferior ramus may represent a nondisplaced fracture. Recommend correlation with point tenderness. 2. Mild right hip osteoarthritis. Electronically Signed   By: Narda Rutherford M.D.   On:  11/14/2023 15:42    Procedures Procedures  {Document cardiac monitor, telemetry assessment procedure when appropriate:1}  Medications Ordered in ED Medications - No data to display  ED Course/ Medical Decision Making/ A&P   {   Click here for ABCD2, HEART and other calculatorsREFRESH Note before signing :1}                              Medical Decision Making Amount and/or Complexity of Data Reviewed Labs: ordered. Radiology: ordered.   This patient complains of ***; this involves an extensive number of treatment Options and is a complaint that carries with it a high risk of complications and morbidity. The differential includes ***  I ordered, reviewed and interpreted labs, which included ***  I ordered medication *** and reviewed PMP when indicated. I ordered imaging studies which included *** and I independently    visualized and interpreted imaging which showed *** Additional history obtained from *** Previous records obtained and reviewed *** I consulted *** and discussed lab and imaging findings and discussed disposition.  Cardiac monitoring reviewed, *** Social determinants considered, *** Critical Interventions: ***  After the interventions stated above, I reevaluated the patient and found *** Admission and further testing considered, ***   {Document critical care time when appropriate:1} {Document review of labs and clinical decision tools ie heart score, Chads2Vasc2 etc:1}  {Document your independent review of radiology images, and any outside records:1} {Document your discussion with family members, caretakers, and with consultants:1} {Document social determinants of health affecting pt's care:1} {Document your decision making why or why not admission, treatments were needed:1} Final Clinical Impression(s) / ED Diagnoses Final diagnoses:  None    Rx / DC Orders ED Discharge Orders     None

## 2023-11-14 NOTE — ED Triage Notes (Signed)
 Pt arrives via [facility] from home for a fall last week. He reports that he was standing up from the couch when his feet slipped and he landed on the ground on his R side. Currently c/o R hip and R shoulder pain. Denies LOC. He states that since then he has been non-ambulatory.

## 2023-11-14 NOTE — ED Provider Triage Note (Signed)
 Emergency Medicine Provider Triage Evaluation Note  [name] , a 73 y.o. male  was evaluated in triage.  Pt complains of a fall Pt complains of hip and shoulder pain right side  Review of Systems  Positive: fall Negative:   Physical Exam  BP (!) 156/77 (BP Location: Left Arm)   Pulse 69   Temp (!) 97.5 F (36.4 C) (Oral)   Resp 16   SpO2 98%  Gen:   Awake, no distress   Resp:  Normal effort  MSK:   Difficulty lifting shoulder, tender right shoulder, tender right hip,  Other:    Medical Decision Making  Medically screening exam initiated at 12:49 PM.  Appropriate orders placed.  [name] was informed that the remainder of the evaluation will be completed by another provider, this initial triage assessment does not replace that evaluation, and the importance of remaining in the ED until their evaluation is complete.     Elson Areas, [place] 11/14/23 1251

## 2023-11-15 NOTE — Progress Notes (Signed)
  PT Cancellation Note  Patient Details Name: [name] MRN: [id number] DOB: 1950-11-26   Cancelled Treatment:    Reason Eval/Treat Not Completed: Patient not medically ready Noted Ortho consult pending (no indication in MD notes of consult ),PT Will evaluate after WBS   clearance due to nondisplaced pelvic fracture. Blanchard Kelch PT Acute Rehabilitation Services Office [phone]   Rada Hay 11/15/2023, 8:03 AM

## 2023-11-15 NOTE — Progress Notes (Signed)
 Consult received from EDP. Imaging reviewed which shows nondisplaced pelvic fractures without disruption to the pelvic ring. Patient may WBAT.  Pain control and mobilization with PT.  Follow up as outpatient in 2-3 weeks post hospital discharge.

## 2023-11-15 NOTE — ED Notes (Signed)
 PTAR called at 1205.

## 2023-11-15 NOTE — ED Provider Notes (Addendum)
 Emergency Medicine Observation Re-evaluation Note  [name] is a 73 y.o. male, seen on rounds today.  Pt initially presented to the ED for complaints of Fall Currently, the patient is sleeping.  Physical Exam  BP 137/80 (BP Location: Right Arm)   Pulse 67   Temp 98 F (36.7 C) (Oral)   Resp 18   SpO2 99%  Physical Exam General: Sleeping Cardiac: Extremities well-perfused Lungs: Breathing is unlabored Psych: Deferred  ED Course / MDM  EKG:   I have reviewed the labs performed to date as well as medications administered while in observation.  Recent changes in the last 24 hours include presentation to the emergency department yesterday for arthralgias following a fall a week ago.  This has caused decreased mobility.  CT pelvis showed acute nondisplaced fracture at the right puboacetabular junction.  Awaiting PT evaluation and social work consult.  Plan  Current plan is for placement.  I spoke with Dr. Charm Barges who saw this patient in the ED yesterday.  He did consult with orthopedic surgeon, Dr. Roda Shutters, who advised that injury is nonoperative and patient can weight-bear as tolerated.  PT evaluated and patient is able to ambulate with rolling walker.  Face-to-face orders were ordered for home health PT.  Patient does request discharge home at this time.   Gloris Manchester, MD 11/15/23 8119    Gloris Manchester, MD 11/15/23 1048    Gloris Manchester, MD 11/15/23 1150

## 2023-11-15 NOTE — Discharge Planning (Signed)
 RNCM notes consult for home health and possible DME needs; PT consult placed.  Awaiting recommendations by PT.  Mirtie Bastyr J. Lucretia Roers, RN, BSN, NCM  Transitions of Care  Nurse Case Manager  [facility] Emergency Departments  Operative Services  [phone]

## 2023-11-15 NOTE — Care Management (Addendum)
 Transition of Care [facility]) - Emergency Department Mini Assessment   Patient Details  Name: [name] MRN: [id number] Date of Birth: 24-Dec-1950  Transition of Care [facility]) CM/SW Contact:    [name] Atlas, RN Phone Number: 11/15/2023, 12:30 PM   Clinical Narrative: [name] consult for HH/DME. Per chart review patient presented to [facility] ED  Via EMS due to a fall last week. Per chart review patient was set up with [facility] last year to follow him in [place]. Patient's current PCP is in [place]. PT has recommended HHPT services.  This RNCM spoke with patient at bedside who reports he has help at home and he usually cooks at home. Patient reports he currently resides with his girlfriend and goes back and forth to [place]. This RNCM encouraged patient to obtain insurance in [place] if he plans to remain in [place]. Patient verbalized understanding. Prior to admission patient has the following DME: RW, cane. This RNCM offered HH choice ([email or web address]) patient will accept any HH agency that will accept his insurance. This RNCM advised patient due to his insurance being out of [place], it maybe a challenge to get [facility] services set up. Patient reports previously he couldn't afford HHPT due to being $40 per visit. Patient now reports his insurance will cover for $20 per visit and he can afford.  This RNCM spoke with [name] with [facility] who reports they are not currently following patient. [name] will contact this RNCM back to advise if she's able to accept.  - 12:50pm Notified the following HH agencies awaiting response: [name] w/[facility]: declined due to being oon [name] w/ [facility]: declined due to insurance is out of network and plan has no oon benefits, and PCP is in [place]  [name]: declined due to insurance out of state/oon [place] w/[facility]: declined oon, unable to accept for charity. - 1:30pm This RNCM spoke with patient and girlfriend at bedside. Explained his insurance is out of network for the [facility] agencies,  unable to get anyone to accept. Patient reports he will call his insurance. Patient's girlfriend request to speak with this RNCM. Patient's girlfriend [name] reports patient is an alcoholic and fell due to drinking. [name] reports patient has Medicaid in [place]. This RNCM explained patient will need to apply for [place] Medicaid which takes 30-45 days. This RNCM explained the importance of patient obtaining PCP in [place], insurance if he plans to remain in [place]. Attached social services, SA, PCP info to AVS. [name] verbalized understanding.  Notified RN, MD.  [name]: no HH agency will accept patient due to [employer] and no PCP established in [place], has PCP in [place].  Transportation at discharge: non emergent ambulance  No additional TOC needs at this time.    ED Mini Assessment: What brought you to the Emergency Department? : PAtient reports fall last week  Barriers to Discharge: Barriers Resolved  Barrier interventions: HH/DME  Means of departure: Ambulance  Interventions which prevented an admission or readmission: Home Health Consult or Services    Patient Contact and Communications        ,          Patient states their goals for this hospitalization and ongoing recovery are:: To feel better CMS Medicare.gov Compare Post Acute Care list provided to:: Patient Choice offered to / list presented to : Patient  Admission diagnosis:  Fall/right side pain Patient Active Problem List   Diagnosis Date Noted   Acute kidney injury superimposed on chronic kidney disease (HCC) 02/13/2023   Acute renal  failure superimposed on stage 3b chronic kidney disease (HCC) 02/12/2023   CKD (chronic kidney disease) stage 3, GFR 30-59 ml/min (HCC) 01/22/2023   AKI (acute kidney injury) (HCC) 01/22/2023   SVT (supraventricular tachycardia) (HCC) 01/22/2023   Nondisplaced fracture of base of third metacarpal bone, right hand, initial encounter for closed fracture 01/22/2023   Spinal stenosis of lumbar region at  multiple levels 01/22/2023   Hypertension 05/04/2011   History of liver transplant (HCC) 01/11/2011   ANEMIA, IRON DEFICIENCY 10/16/2010   DERMATITIS 10/16/2010   History of colonic polyps 10/16/2010   THROMBOCYTOPENIA 10/14/2010   ALCOHOL ABUSE, HX OF 10/14/2010   PCP:  [name], No Pharmacy:   [facility] [address] [place] - [name] [place] [address] Phone: [phone] Fax: [phone]

## 2023-11-15 NOTE — Evaluation (Signed)
 Physical Therapy Evaluation Patient Details Name: [name] MRN: [id number] DOB: 1951/08/25 Today's Date: 11/15/2023  History of Present Illness  [name] is a 73 y.o. male. sustained a fall several days ago and unable to ambulate.  CT pelvis showed nondisplaced Right rami fracture and inferior pubic rami fracture. QMV:HQION transplant, thrombocytopenia, ETOH, HTN, anemia,  L armand ankle fxs,  Clinical Impression  Pt admitted with above diagnosis.  Pt currently with functional limitations due to the deficits listed below (see PT Problem List). Pt will benefit from acute skilled PT to increase their independence and safety with mobility to allow discharge.     The patient reports that he has assistance of another person at home. Reports lives in 2 level home but has been staying on first level since fall. Patient has a RW and ambulated x 30'  Patient transports that he  wants to return home, asking for nonemergency  transport as  does not have anyone to transport and has 4 STE home.  PT will follow while in hospital.  Recommend HHPT, patient agreeable.      If plan is discharge home, recommend the following: A little help with walking and/or transfers;Assistance with cooking/housework;A lot of help with walking and/or transfers;Assist for transportation;A lot of help with bathing/dressing/bathroom;Help with stairs or ramp for entrance   Can travel by private vehicle        Equipment Recommendations None recommended by PT  Recommendations for Other Services       Functional Status Assessment Patient has had a recent decline in their functional status and demonstrates the ability to make significant improvements in function in a reasonable and predictable amount of time.     Precautions / Restrictions Precautions Precautions: Fall Restrictions Weight Bearing Restrictions Per Provider Order: Yes RLE Weight Bearing Per Provider Order: Weight bearing as tolerated       Mobility  Bed Mobility Overal bed mobility: Needs Assistance Bed Mobility: Supine to Sit, Sit to Supine     Supine to sit: Supervision Sit to supine: Supervision   General bed mobility comments: self assists the right leg    Transfers Overall transfer level: Needs assistance Equipment used: Rolling walker (2 wheels) Transfers: Sit to/from Stand Sit to Stand: Contact guard assist           General transfer comment: stands at [employer]    Ambulation/Gait Ambulation/Gait assistance: Contact guard assist Gait Distance (Feet): 30 Feet Assistive device: Rolling walker (2 wheels) Gait Pattern/deviations: Step-to pattern, Antalgic Gait velocity: decr     General Gait Details: left LE is shakey, no balance loss  Stairs            Wheelchair Mobility     Tilt Bed    Modified Rankin (Stroke Patients Only)       Balance Overall balance assessment: Needs assistance, History of Falls Sitting-balance support: No upper extremity supported, Feet supported Sitting balance-Leahy Scale: Fair     Standing balance support: During functional activity, Bilateral upper extremity supported, Reliant on assistive device for balance Standing balance-Leahy Scale: Fair Standing balance comment: with RW                             Pertinent Vitals/Pain Pain Assessment Pain Assessment: Faces Faces Pain Scale: Hurts whole lot Pain Location: right  groin area Pain Descriptors / Indicators: Grimacing, Discomfort Pain Intervention(s): Monitored during session, Limited activity within patient's tolerance    Home Living Family/patient expects  to be discharged to:: Private residence Living Arrangements: Other relatives;Non-relatives/Friends   Type of Home: House Home Access: Stairs to enter Entrance Stairs-Rails: Doctor, general practice of Steps: 4 Alternate Level Stairs-Number of Steps: 14 Home Layout: Two level;Bed/bath upstairs;1/2 bath on main  level Home Equipment: Rolling Walker (2 wheels);Grab bars - tub/shower;BSC/3in1 Additional Comments: patient reports that he has been sleeping on couch since fall    Prior Function               Mobility Comments: reports on AD use ADLs Comments: reports he is ind, reports he cooks     Extremity/Trunk Assessment   Upper Extremity Assessment Upper Extremity Assessment: Overall WFL for tasks assessed    Lower Extremity Assessment Lower Extremity Assessment: RLE deficits/detail RLE Deficits / Details: able to  flex hip in supine, self assists  RLE onto the  bed    Cervical / Trunk Assessment Cervical / Trunk Assessment: Kyphotic  Communication   Communication Communication: No apparent difficulties    Cognition Arousal: Alert Behavior During Therapy: WFL for tasks assessed/performed   PT - Cognitive impairments: No family/caregiver present to determine baseline, Orientation                       PT - Cognition Comments: states  he wants to get his clothes from upstairs, pt. then oriented to WL. Following commands: Intact       Cueing Cueing Techniques: Verbal cues     General Comments      Exercises     Assessment/Plan    PT Assessment Patient needs continued PT services  PT Problem List Decreased strength;Decreased range of motion;Decreased cognition;Decreased activity tolerance;Decreased balance;Decreased mobility;Pain       PT Treatment Interventions DME instruction;Therapeutic exercise;Gait training;Functional mobility training;Therapeutic activities;Patient/family education    PT Goals (Current goals can be found in the Care Plan section)  Acute Rehab PT Goals Patient Stated Goal: to go home PT Goal Formulation: With patient Time For Goal Achievement: 11/29/23 Potential to Achieve Goals: Good    Frequency Min 2X/week     Co-evaluation               AM-PAC PT "6 Clicks" Mobility  Outcome Measure Help needed turning from your  back to your side while in a flat bed without using bedrails?: None Help needed moving from lying on your back to sitting on the side of a flat bed without using bedrails?: None Help needed moving to and from a bed to a chair (including a wheelchair)?: None Help needed standing up from a chair using your arms (e.g., wheelchair or bedside chair)?: A Little Help needed to walk in hospital room?: A Little Help needed climbing 3-5 steps with a railing? : A Lot 6 Click Score: 20    End of Session Equipment Utilized During Treatment: Gait belt Activity Tolerance: Patient tolerated treatment well Patient left: in bed Nurse Communication: Mobility status PT Visit Diagnosis: Unsteadiness on feet (R26.81);History of falling (Z91.81);Pain Pain - Right/Left: Right Pain - part of body: Leg    Time: 1123-1140 PT Time Calculation (min) (ACUTE ONLY): 17 min   Charges:   PT Evaluation $PT Eval Low Complexity: 1 Low   PT General Charges $$ ACUTE PT VISIT: 1 Visit         Blanchard Kelch PT Acute Rehabilitation Services Office [phone] Weekend [phone]   Rada Hay 11/15/2023, 11:58 AM

## 2024-01-21 ENCOUNTER — Inpatient Hospital Stay (HOSPITAL_COMMUNITY)
Admission: EM | Admit: 2024-01-21 | Discharge: 2024-01-24 | DRG: 896 | Disposition: A | Discharge status: Home / Self Care | Payer: Medicare (Managed Care) | Attending: Internal Medicine | Admitting: Internal Medicine

## 2024-01-21 ENCOUNTER — Emergency Department (HOSPITAL_COMMUNITY): Payer: Medicare (Managed Care)

## 2024-01-21 ENCOUNTER — Encounter (HOSPITAL_COMMUNITY): Payer: Self-pay

## 2024-01-21 ENCOUNTER — Other Ambulatory Visit: Payer: Self-pay

## 2024-01-21 DIAGNOSIS — Z881 Allergy status to other antibiotic agents status: Secondary | ICD-10-CM | POA: Diagnosis not present

## 2024-01-21 DIAGNOSIS — S2249XA Multiple fractures of ribs, unspecified side, initial encounter for closed fracture: Secondary | ICD-10-CM | POA: Diagnosis present

## 2024-01-21 DIAGNOSIS — Z9181 History of falling: Secondary | ICD-10-CM

## 2024-01-21 DIAGNOSIS — Z944 Liver transplant status: Secondary | ICD-10-CM | POA: Diagnosis not present

## 2024-01-21 DIAGNOSIS — S2241XA Multiple fractures of ribs, right side, initial encounter for closed fracture: Secondary | ICD-10-CM

## 2024-01-21 DIAGNOSIS — I10 Essential (primary) hypertension: Secondary | ICD-10-CM | POA: Diagnosis present

## 2024-01-21 DIAGNOSIS — I959 Hypotension, unspecified: Secondary | ICD-10-CM | POA: Diagnosis present

## 2024-01-21 DIAGNOSIS — F10229 Alcohol dependence with intoxication, unspecified: Principal | ICD-10-CM | POA: Diagnosis present

## 2024-01-21 DIAGNOSIS — W19XXXA Unspecified fall, initial encounter: Secondary | ICD-10-CM | POA: Diagnosis not present

## 2024-01-21 DIAGNOSIS — D631 Anemia in chronic kidney disease: Secondary | ICD-10-CM | POA: Diagnosis present

## 2024-01-21 DIAGNOSIS — N1832 Chronic kidney disease, stage 3b: Secondary | ICD-10-CM | POA: Diagnosis present

## 2024-01-21 DIAGNOSIS — Z7141 Alcohol abuse counseling and surveillance of alcoholic: Secondary | ICD-10-CM

## 2024-01-21 DIAGNOSIS — N183 Chronic kidney disease, stage 3 unspecified: Secondary | ICD-10-CM | POA: Diagnosis present

## 2024-01-21 DIAGNOSIS — Z79899 Other long term (current) drug therapy: Secondary | ICD-10-CM

## 2024-01-21 DIAGNOSIS — I129 Hypertensive chronic kidney disease with stage 1 through stage 4 chronic kidney disease, or unspecified chronic kidney disease: Secondary | ICD-10-CM | POA: Diagnosis present

## 2024-01-21 DIAGNOSIS — Z888 Allergy status to other drugs, medicaments and biological substances status: Secondary | ICD-10-CM

## 2024-01-21 DIAGNOSIS — S271XXA Traumatic hemothorax, initial encounter: Secondary | ICD-10-CM | POA: Diagnosis present

## 2024-01-21 DIAGNOSIS — E8809 Other disorders of plasma-protein metabolism, not elsewhere classified: Secondary | ICD-10-CM | POA: Diagnosis present

## 2024-01-21 DIAGNOSIS — Z79621 Long term (current) use of calcineurin inhibitor: Secondary | ICD-10-CM | POA: Diagnosis not present

## 2024-01-21 DIAGNOSIS — K5900 Constipation, unspecified: Secondary | ICD-10-CM | POA: Diagnosis not present

## 2024-01-21 DIAGNOSIS — Y908 Blood alcohol level of 240 mg/100 ml or more: Secondary | ICD-10-CM | POA: Diagnosis present

## 2024-01-21 DIAGNOSIS — N179 Acute kidney failure, unspecified: Secondary | ICD-10-CM | POA: Diagnosis present

## 2024-01-21 DIAGNOSIS — E875 Hyperkalemia: Secondary | ICD-10-CM | POA: Diagnosis not present

## 2024-01-21 DIAGNOSIS — F1021 Alcohol dependence, in remission: Secondary | ICD-10-CM | POA: Diagnosis not present

## 2024-01-21 DIAGNOSIS — E871 Hypo-osmolality and hyponatremia: Secondary | ICD-10-CM | POA: Diagnosis not present

## 2024-01-21 DIAGNOSIS — Z801 Family history of malignant neoplasm of trachea, bronchus and lung: Secondary | ICD-10-CM | POA: Diagnosis not present

## 2024-01-21 DIAGNOSIS — Z91048 Other nonmedicinal substance allergy status: Secondary | ICD-10-CM | POA: Diagnosis not present

## 2024-01-21 DIAGNOSIS — Z8261 Family history of arthritis: Secondary | ICD-10-CM

## 2024-01-21 DIAGNOSIS — Y92009 Unspecified place in unspecified non-institutional (private) residence as the place of occurrence of the external cause: Secondary | ICD-10-CM

## 2024-01-21 DIAGNOSIS — R296 Repeated falls: Secondary | ICD-10-CM | POA: Diagnosis present

## 2024-01-21 DIAGNOSIS — R0902 Hypoxemia: Secondary | ICD-10-CM | POA: Diagnosis present

## 2024-01-21 DIAGNOSIS — N1831 Chronic kidney disease, stage 3a: Secondary | ICD-10-CM | POA: Diagnosis not present

## 2024-01-21 DIAGNOSIS — K7030 Alcoholic cirrhosis of liver without ascites: Secondary | ICD-10-CM | POA: Diagnosis present

## 2024-01-21 DIAGNOSIS — Z8249 Family history of ischemic heart disease and other diseases of the circulatory system: Secondary | ICD-10-CM

## 2024-01-21 LAB — COMPREHENSIVE METABOLIC PANEL WITH GFR
ALT: 14 U/L (ref 0–44)
AST: 35 U/L (ref 15–41)
Albumin: 2.7 g/dL — ABNORMAL LOW (ref 3.5–5.0)
Alkaline Phosphatase: 71 U/L (ref 38–126)
Anion gap: 9 (ref 5–15)
BUN: 30 mg/dL — ABNORMAL HIGH (ref 8–23)
CO2: 22 mmol/L (ref 22–32)
Calcium: 8.5 mg/dL — ABNORMAL LOW (ref 8.9–10.3)
Chloride: 104 mmol/L (ref 98–111)
Creatinine, Ser: 2.99 mg/dL — ABNORMAL HIGH (ref 0.61–1.24)
GFR, Estimated: 22 mL/min — ABNORMAL LOW (ref >60)
Glucose, Bld: 90 mg/dL (ref 70–99)
Potassium: 3.8 mmol/L (ref 3.5–5.1)
Sodium: 135 mmol/L (ref 135–145)
Total Bilirubin: 0.8 mg/dL (ref 0.0–1.2)
Total Protein: 5.6 g/dL — ABNORMAL LOW (ref 6.5–8.1)

## 2024-01-21 LAB — CBC WITH DIFFERENTIAL/PLATELET
Abs Immature Granulocytes: 0.05 10*3/uL (ref 0.00–0.07)
Basophils Absolute: 0 10*3/uL (ref 0.0–0.1)
Basophils Relative: 0 %
Eosinophils Absolute: 0.1 10*3/uL (ref 0.0–0.5)
Eosinophils Relative: 2 %
HCT: 28.2 % — ABNORMAL LOW (ref 39.0–52.0)
Hemoglobin: 9.1 g/dL — ABNORMAL LOW (ref 13.0–17.0)
Immature Granulocytes: 1 %
Lymphocytes Relative: 20 %
Lymphs Abs: 1.4 10*3/uL (ref 0.7–4.0)
MCH: 31.2 pg (ref 26.0–34.0)
MCHC: 32.3 g/dL (ref 30.0–36.0)
MCV: 96.6 fL (ref 80.0–100.0)
Monocytes Absolute: 0.5 10*3/uL (ref 0.1–1.0)
Monocytes Relative: 7 %
Neutro Abs: 4.8 10*3/uL (ref 1.7–7.7)
Neutrophils Relative %: 70 %
Platelets: 203 10*3/uL (ref 150–400)
RBC: 2.92 MIL/uL — ABNORMAL LOW (ref 4.22–5.81)
RDW: 16.7 % — ABNORMAL HIGH (ref 11.5–15.5)
WBC: 6.8 10*3/uL (ref 4.0–10.5)
nRBC: 0 % (ref 0.0–0.2)

## 2024-01-21 LAB — PROTIME-INR
INR: 1.1 (ref 0.8–1.2)
Prothrombin Time: 14.6 s (ref 11.4–15.2)

## 2024-01-21 LAB — AMMONIA: Ammonia: 15 umol/L (ref 9–35)

## 2024-01-21 LAB — ETHANOL: Alcohol, Ethyl (B): 295 mg/dL — ABNORMAL HIGH (ref <15)

## 2024-01-21 LAB — GLUCOSE, CAPILLARY: Glucose-Capillary: 95 mg/dL (ref 70–99)

## 2024-01-21 MED ORDER — AMLODIPINE BESYLATE 5 MG PO TABS
5.0000 mg | ORAL_TABLET | Freq: Every day | ORAL | Status: DC
  Administered 2024-01-22 – 2024-01-24 (×3): 5 mg via ORAL
  Filled 2024-01-21 (×3): qty 1

## 2024-01-21 MED ORDER — ADULT MULTIVITAMIN W/MINERALS CH
1.0000 | ORAL_TABLET | Freq: Every day | ORAL | Status: DC
  Administered 2024-01-22 – 2024-01-24 (×3): 1 via ORAL
  Filled 2024-01-21 (×3): qty 1

## 2024-01-21 MED ORDER — LORAZEPAM 1 MG PO TABS: 1.0000 mg | ORAL_TABLET | ORAL | Status: DC | PRN

## 2024-01-21 MED ORDER — ACETAMINOPHEN 500 MG PO TABS: 500.0000 mg | ORAL_TABLET | Freq: Three times a day (TID) | ORAL | Status: DC

## 2024-01-21 MED ORDER — INSULIN ASPART 100 UNIT/ML IJ SOLN: 0.0000 [IU] | Freq: Every day | INTRAMUSCULAR | Status: DC

## 2024-01-21 MED ORDER — ONDANSETRON HCL 4 MG/2ML IJ SOLN: 4.0000 mg | Freq: Four times a day (QID) | INTRAMUSCULAR | Status: DC | PRN

## 2024-01-21 MED ORDER — THIAMINE MONONITRATE 100 MG PO TABS
100.0000 mg | ORAL_TABLET | Freq: Every day | ORAL | Status: DC
  Administered 2024-01-22 – 2024-01-24 (×3): 100 mg via ORAL
  Filled 2024-01-21 (×3): qty 1

## 2024-01-21 MED ORDER — ENOXAPARIN SODIUM 30 MG/0.3ML IJ SOSY
30.0000 mg | PREFILLED_SYRINGE | INTRAMUSCULAR | Status: DC
  Administered 2024-01-21 – 2024-01-23 (×3): 30 mg via SUBCUTANEOUS
  Filled 2024-01-21 (×3): qty 0.3

## 2024-01-21 MED ORDER — GUAIFENESIN ER 600 MG PO TB12: 600.0000 mg | ORAL_TABLET | Freq: Two times a day (BID) | ORAL | Status: DC | PRN

## 2024-01-21 MED ORDER — TAMSULOSIN HCL 0.4 MG PO CAPS
0.4000 mg | ORAL_CAPSULE | Freq: Every day | ORAL | Status: DC
  Administered 2024-01-22 – 2024-01-24 (×3): 0.4 mg via ORAL
  Filled 2024-01-21 (×3): qty 1

## 2024-01-21 MED ORDER — LORAZEPAM 2 MG/ML IJ SOLN: 1.0000 mg | INTRAMUSCULAR | Status: DC | PRN

## 2024-01-21 MED ORDER — MORPHINE SULFATE (PF) 2 MG/ML IV SOLN: 2.0000 mg | INTRAVENOUS | Status: DC | PRN

## 2024-01-21 MED ORDER — FOLIC ACID 1 MG PO TABS
1.0000 mg | ORAL_TABLET | Freq: Every day | ORAL | Status: DC
  Administered 2024-01-22 – 2024-01-24 (×3): 1 mg via ORAL
  Filled 2024-01-21 (×3): qty 1

## 2024-01-21 MED ORDER — INSULIN ASPART 100 UNIT/ML IJ SOLN
0.0000 [IU] | Freq: Three times a day (TID) | INTRAMUSCULAR | Status: DC
  Administered 2024-01-22: 2 [IU] via SUBCUTANEOUS

## 2024-01-21 MED ORDER — METHOCARBAMOL 500 MG PO TABS
500.0000 mg | ORAL_TABLET | Freq: Three times a day (TID) | ORAL | Status: DC
  Administered 2024-01-21 – 2024-01-24 (×8): 500 mg via ORAL
  Filled 2024-01-21 (×8): qty 1

## 2024-01-21 MED ORDER — OXYCODONE HCL 5 MG PO TABS
5.0000 mg | ORAL_TABLET | Freq: Once | ORAL | Status: AC
  Administered 2024-01-21: 5 mg via ORAL
  Filled 2024-01-21: qty 1

## 2024-01-21 MED ORDER — FENTANYL CITRATE PF 50 MCG/ML IJ SOSY
25.0000 ug | PREFILLED_SYRINGE | Freq: Once | INTRAMUSCULAR | Status: AC
  Administered 2024-01-21: 25 ug via INTRAVENOUS
  Filled 2024-01-21: qty 1

## 2024-01-21 MED ORDER — FAMOTIDINE 20 MG PO TABS
10.0000 mg | ORAL_TABLET | Freq: Every day | ORAL | Status: DC
  Administered 2024-01-21 – 2024-01-23 (×3): 10 mg via ORAL
  Filled 2024-01-21 (×3): qty 1

## 2024-01-21 MED ORDER — METOPROLOL TARTRATE 50 MG PO TABS
50.0000 mg | ORAL_TABLET | Freq: Two times a day (BID) | ORAL | Status: DC
  Administered 2024-01-21 – 2024-01-24 (×6): 50 mg via ORAL
  Filled 2024-01-21 (×6): qty 1

## 2024-01-21 MED ORDER — OXYCODONE HCL 5 MG PO TABS
5.0000 mg | ORAL_TABLET | ORAL | Status: DC | PRN
  Administered 2024-01-22 – 2024-01-24 (×7): 10 mg via ORAL
  Filled 2024-01-21 (×7): qty 2

## 2024-01-21 MED ORDER — SODIUM CHLORIDE 0.9 % IV BOLUS
1000.0000 mL | Freq: Once | INTRAVENOUS | Status: AC
  Administered 2024-01-21: 1000 mL via INTRAVENOUS

## 2024-01-21 MED ORDER — ONDANSETRON HCL 4 MG PO TABS
4.0000 mg | ORAL_TABLET | Freq: Four times a day (QID) | ORAL | Status: DC | PRN
  Administered 2024-01-22: 4 mg via ORAL
  Filled 2024-01-21: qty 1

## 2024-01-21 MED ORDER — THIAMINE HCL 100 MG/ML IJ SOLN
100.0000 mg | Freq: Every day | INTRAMUSCULAR | Status: DC
  Filled 2024-01-21: qty 2

## 2024-01-21 MED ORDER — DEXTROSE IN LACTATED RINGERS 5 % IV SOLN: INTRAVENOUS | Status: AC

## 2024-01-21 NOTE — H&P (Signed)
 History and Physical    Patient: [name] DOB: 06/08/1951 DOA: 01/21/2024 DOS: the patient was seen and examined on 01/21/2024 PCP: Pcp, No  Patient coming from: Home  Chief Complaint:  Chief Complaint  Patient presents with   Fall   HPI: [name] is a 73 y.o. male with medical history significant of alcohol abuse, hepatic failure status post liver transplant in 2004 and 2012, chronic kidney disease stage III, anemia of chronic disease who continues to drink alcohol.  His last drink was yesterday.  Patient brought to the emergency room secondary to sustaining a fall at home today.  He apparently drank about a pint of liquor today reported by EMS despite his reported last use yesterday.  EMS arrived and patient was hypotensive with systolic blood pressure around 90.  The systolic drop in his 60s when they get him up.  He received fluid boluses almost to the ER.  He complained of the right sided chest wall pain otherwise nonspecific complaints.  Patient was evaluated and found to have significant multiple rib fractures.  Due to his chronic medical problems including liver transplant trauma surgeon consulted who recommended admission to the medical service where they will follow.  Review of Systems: As mentioned in the history of present illness. All other systems reviewed and are negative. Past Medical History:  Diagnosis Date   ALCOHOL ABUSE, HX OF    ANEMIA, IRON DEFICIENCY    DERMATITIS    Hepatic encephalopathy (HCC)    HEPATIC FAILURE, END STAGE 2004   liver transplant 2004; 2nd orthotopic liver transplant 01/25/11 [facility] [phone] dr. Royston Cornea dewar   Hypertension    THROMBOCYTOPENIA    Past Surgical History:  Procedure Laterality Date   (L) arm broke     Broke (L) ankle   2001   HERNIA REPAIR     LIVER TRANSPLANT  1st 04/21/03; 2nd 01/25/11   @ [facility] dr. Wyona Heft [phone];  [phone]   Social History:  reports that he has never  smoked. He has never used smokeless tobacco. He reports current alcohol use. He reports that he does not use drugs.  Allergies  Allergen Reactions   Tape Other (See Comments)    SKIN TEARS VERY EASILY!!!!! Can tolerate ONLY paper tape.   Zolpidem Other (See Comments)    Confusion   Moxifloxacin Rash and Other (See Comments)    Achilles tendon pain also    Family History  Problem Relation Age of Onset   Arthritis Mother    Hypertension Mother    Lung cancer Father    Heart disease Father    Cancer Father        lung cancer   Hypertension Sister     Prior to Admission medications   Medication Sig Start Date End Date Taking? Authorizing Provider  amLODipine  (NORVASC ) 5 MG tablet Take 5 mg by mouth daily.   Yes [provider]  Ascorbic Acid (VITAMIN C) 500 MG tablet Take 500 mg by mouth daily.     Yes [provider]  buPROPion  (WELLBUTRIN  SR) 150 MG 12 hr tablet Take 1 tablet (150 mg total) by mouth daily. Patient taking differently: Take 150 mg by mouth 2 (two) times daily. 01/27/23  Yes Dahal, Aminta Baldy, MD  Calcium-Magnesium -Vitamin D (CITRACAL CALCIUM+D PO) Take 1 tablet by mouth daily.   Yes [provider]  famotidine  (PEPCID ) 40 MG tablet Take 40 mg by mouth at bedtime.   Yes [provider]  feeding supplement (  ENSURE ENLIVE / ENSURE PLUS) LIQD Take 237 mLs by mouth 2 (two) times daily between meals. Patient taking differently: Take 237 mLs by mouth as needed (Meal replacement). 02/14/23  Yes Fonnie Iba I, MD  Magnesium  Oxide 400 MG CAPS Take 400 mg by mouth daily as needed (cramps).   Yes [provider]  metoprolol  (LOPRESSOR ) 50 MG tablet Take 1 tablet (50 mg total) by mouth 2 (two) times daily. 05/04/11  Yes Carolene Chute, MD  Multiple Vitamin (MULTIVITAMIN) tablet Take 1 tablet by mouth daily with breakfast.   Yes [provider]  pantoprazole  (PROTONIX ) 40 MG tablet Take 1 tablet (40 mg total) by mouth daily.  01/28/23 01/21/24 Yes Dahal, Aminta Baldy, MD  sodium bicarbonate  650 MG tablet Take 2 tablets (1,300 mg total) by mouth 2 (two) times daily. Patient taking differently: Take 650 mg by mouth 2 (two) times daily. 02/14/23 01/21/24 Yes Doroteo Gasmen, MD  tacrolimus  (PROGRAF ) 1 MG capsule Take 1 mg by mouth in the morning and at bedtime.   Yes [provider]  tamsulosin  (FLOMAX ) 0.4 MG CAPS capsule Take 0.4 mg by mouth daily.   Yes [provider]  traZODone  (DESYREL ) 100 MG tablet Take 100 mg by mouth at bedtime as needed for sleep.   Yes [provider]  oxyCODONE -acetaminophen  (PERCOCET/ROXICET) 5-325 MG tablet Take 1 tablet by mouth every 6 (six) hours as needed for severe pain (pain score 7-10). Patient not taking: Reported on 01/21/2024 11/14/23   Tonya Fredrickson, MD    Physical Exam: Vitals:   01/21/24 1800 01/21/24 1900 01/21/24 2053 01/21/24 2107  BP: 121/71 126/71  136/78  Pulse: 62 64    Resp: 13 14    Temp:   (!) 97.5 F (36.4 C) 97.6 F (36.4 C)  TempSrc:   Oral Oral  SpO2: 91% (!) 88%  96%  Weight:      Height:       Constitutional: Acutely ill looking, tremulous, NAD, calm, comfortable Eyes: PERRL, lids and conjunctivae normal ENMT: Mucous membranes are moist. Posterior pharynx clear of any exudate or lesions.abnormal dentition Neck: normal, supple, no masses, no thyromegaly Respiratory: clear to auscultation bilaterally, no wheezing, no crackles. Normal respiratory effort. No accessory muscle use.  Right-sided chest wall tenderness Cardiovascular: Regular rate and rhythm, no murmurs / rubs / gallops. No extremity edema. 2+ pedal pulses. No carotid bruits.  Abdomen: no tenderness, no masses palpated. No hepatosplenomegaly. Bowel sounds positive.  Musculoskeletal: Good range of motion, no joint swelling or tenderness, Skin: no rashes, lesions, ulcers. No induration Neurologic: CN 2-12 grossly intact. Sensation intact, DTR normal. Strength 5/5 in all  4.  Psychiatric: Normal judgment and insight. Alert and oriented x 3.  Anxious mood  Data Reviewed:  BUN is 30 creatinine 2.99 calcium 8.5.  Albumin 2.7.  Hemoglobin 9.1.  PT 14.6 INR 1.1.  CT cervical spine without contrast showed no acute fracture.  CT head without contrast showed no acute intracranial abnormality.  Chest x-ray showed minimal right apical pneumothorax.  Also displaced fractures involving the lateral portions of the right 3rd through 8th ribs CT chest abdomen pelvis without contrast showed acute displaced segmental right 3rd through 9th rib fractures with small associated pneumothorax no evidence of hemothorax  Assessment and Plan:  #1 status post fall: Patient was hypotensive when EMS arrived.  Most likely as result of alcoholism.  Patient is being aggressively hydrated.  Will admit.  Pain control.  Counseled on alcohol intake.  PT  OT consulted.  #2 acute right 3rd through 8th rib fractures: Associated small pneumothorax.  Patient will be admitted for pain management.  Initiate incentive spirometer with pain control.  Also PT and OT consultation  #3 alcohol abuse: Initiate CIWA protocol.  Counseling provided  #4 history of liver transplant: Stable.  Continue to monitor  #5 chronic kidney disease stage IIIb: Continue to monitor.  Appears to be at baseline  #6 essential hypertension: Will resume home regimen.      Advance Care Planning:   Code Status: Full Code   Consults: Trauma surgery  Family Communication: No family at bedside  Severity of Illness: The appropriate patient status for this patient is INPATIENT. Inpatient status is judged to be reasonable and necessary in order to provide the required intensity of service to ensure the patient's safety. The patient's presenting symptoms, physical exam findings, and initial radiographic and laboratory data in the context of their chronic comorbidities is felt to place them at high risk for further clinical  deterioration. Furthermore, it is not anticipated that the patient will be medically stable for discharge from the hospital within 2 midnights of admission.   * I certify that at the point of admission it is my clinical judgment that the patient will require inpatient hospital care spanning beyond 2 midnights from the point of admission due to high intensity of service, high risk for further deterioration and high frequency of surveillance required.*  AuthorCarolin Chyle, MD 01/21/2024 9:21 PM  For on call review [email or web address].

## 2024-01-21 NOTE — ED Triage Notes (Signed)
 PT BIB [facility] from home after drinking a pint of vodka all day, climbing up some stairs and falling hitting the right flank area/ ribs on toilet. A friend next door helped him off the floor. PT is describing right flank pain as a 10. PT did not hit head, no LOC and not on thinners. PT does have a poor PO intake. When EMS arrived PT had a sitting BP of 90/52 and standing palpable HR of 60. PT given 500L of fluid. CBG:120 EKG normal and a bruise is noted in the right flank area.

## 2024-01-21 NOTE — ED Provider Notes (Signed)
 [place] EMERGENCY DEPARTMENT AT [facility] Provider Note  History  Chief Complaint:  No chief complaint on file.  The history is provided by the patient.     [name] is a 73 y.o. 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  The patient states that he is drank approximately 1 pint of liquor today.  He reports that he has not drank much water.  Less appetite is normal.  EMS states that on scene patient did have systolic blood pressure in the 90s sitting down.  When they stood him up he was systolics in the 60s.  Patient was given fluids and patient's blood pressure did respond.  He was not tachycardic at that time.  He had a normal mental status.  He did appear intoxicated.  Patient complains of no chest pain, shortness of breath, abdominal pain, nausea or vomiting.  He states he has not had a fever.  States he has been taking his antirejection medications.   Past Medical History:  Diagnosis Date   ALCOHOL ABUSE, HX OF    ANEMIA, IRON DEFICIENCY    DERMATITIS    Hepatic encephalopathy (HCC)    HEPATIC FAILURE, END STAGE 2004   liver transplant 2004; 2nd orthotopic liver transplant 01/25/11 [facility] [phone] dr. Royston Cornea dewar   Hypertension    THROMBOCYTOPENIA     Past Surgical History:  Procedure Laterality Date   (L) arm broke     Broke (L) ankle   2001   HERNIA REPAIR     LIVER TRANSPLANT  1st 04/21/03; 2nd 01/25/11   @ [facility] dr. Wyona Heft [phone];  [phone]    Family History  Problem Relation Age of Onset   Arthritis Mother    Hypertension Mother    Lung cancer Father    Heart disease Father    Cancer Father        lung cancer   Hypertension Sister     Social History   Tobacco Use   Smoking status: Never   Smokeless tobacco: Never   Tobacco comments:    Remotely remarried-divorced in 1979 after 3 kids. Single, lives with g-friend [name].  Disabled  Substance Use Topics   Alcohol use: Yes    Comment: No alcohol since 2003   Drug use: No    Review of Systems  Review of Systems   Reviewed and documented in HPI if pertinent.   Physical Exam   Vitals:   01/21/24 1540  BP: (!) 141/86  Pulse: 63  Resp: 14  Temp: 97.6 F (36.4 C)  SpO2: 100%   Physical Exam Vitals and nursing note reviewed.  Constitutional:      General: He is not in acute distress.    Appearance: He is well-developed.  HENT:     Head: Normocephalic and atraumatic.  Eyes:     Conjunctiva/sclera: Conjunctivae normal.  Cardiovascular:     Rate and Rhythm: Normal rate and regular rhythm.     Heart sounds: No murmur heard. Pulmonary:     Effort: Pulmonary effort is normal. No respiratory distress.     Breath sounds: Normal breath sounds.  Abdominal:     Palpations: Abdomen is soft.     Tenderness: There is no abdominal tenderness.  Musculoskeletal:        General: No swelling.     Cervical back: Neck supple. No bony tenderness.     Thoracic  back: No bony tenderness.     Lumbar back: No bony tenderness.     Right lower leg: No edema.     Left lower leg: No edema.  Skin:    General: Skin is warm and dry.     Capillary Refill: Capillary refill takes less than 2 seconds.  Neurological:     Mental Status: He is alert.  Psychiatric:        Mood and Affect: Mood normal.      Procedures   Procedures  ED Course - Medical Decision Making  Brief Overview [name] is a 73 y.o. male who presents as per above.  I have reviewed the nursing documentation for past medical history, family history, and social history and agree.  I have reviewed the patient's vital signs. There are ***no abnormalities***.  Initial Differential Diagnoses: I am primarily concerned for ***  Therapies: These medications and interventions were provided for the patient while in the ED.  Medications - No data to display  Testing Results: On my  interpretation labs are significant for : ***  On my interpretation imaging is significant for: ***  I interpreted the ECG. It reveals a sinus rhythm. The QTc, PR, and QRS are appropriate. There are no signs of acute ischemia or of significant electrical abnormalities. The ECG does not show a STEMI. There are no ST depressions. There are no T wave inversions. There is no evidence of a High-Grade Conduction Block, WPW, Brugada Sign, ARVC, DeWinters T Waves, or Wellens Waves.  See the EMR for full details regarding lab and imaging results.   Medical Decision Making Amount and/or Complexity of Data Reviewed Labs: ordered. Radiology: ordered.  Risk Prescription drug management. Decision regarding hospitalization.     ### All radiography studies, electrocardiograms, and laboratory data were personally reviewed by me and incorporated into my medical decision making. Impression   1. Fall, initial encounter      Note: Chief Executive Officer was used in Surveyor, minerals of this note.

## 2024-01-21 NOTE — Consult Note (Signed)
 [name] 1951/05/04  [id number].    Requesting MD: Arminda Landmark, MD Chief Complaint/Reason for Consult: fall, rib fractures  HPI:  [name] is a 73 yo male with a history of CKD and EtOH cirrhosis s/p liver transplant x2 (2004 and 2012 at [facility]), who presented to the ED today after a fall at home. He reports he was going to the bathroom and fell, and hit his ride side on the toilet. He did not hit his head and denies loss of consciousness. He has pain on the right chest wall, but denies pain anywhere else. He had a soft BP at the scene per EMS and was given 500mL crystalloid. BP has been normal in the ED. He is currently stable on room air. EtOH level today is 295. CT scans in the ED showed numerous right sided rib fractures with a small hemothorax. He has no other acute injuries in the head, C spine, or chest/abd/pelvis.  He is not on any blood thinners. He was seen in the ED in March after a previous fall, at which time he had acute pelvic fractures. CT scan today shows healed bilateral rib fractures.  ROS: Review of Systems  Constitutional:  Negative for chills and fever.  Respiratory:  Negative for shortness of breath.   Gastrointestinal:  Negative for abdominal pain.  Musculoskeletal:  Positive for falls.  Neurological:  Negative for loss of consciousness.    Family History  Problem Relation Age of Onset   Arthritis Mother    Hypertension Mother    Lung cancer Father    Heart disease Father    Cancer Father        lung cancer   Hypertension Sister     Past Medical History:  Diagnosis Date   ALCOHOL ABUSE, HX OF    ANEMIA, IRON DEFICIENCY    DERMATITIS    Hepatic encephalopathy (HCC)    HEPATIC FAILURE, END STAGE 2004   liver transplant 2004; 2nd orthotopic liver transplant 01/25/11 [facility] [phone] dr. Royston Cornea dewar   Hypertension    THROMBOCYTOPENIA     Past Surgical History:  Procedure Laterality Date   (L) arm broke     Broke (L) ankle   2001    HERNIA REPAIR     LIVER TRANSPLANT  1st 04/21/03; 2nd 01/25/11   @ [facility] dr. Wyona Heft [phone];  [phone]    Social History:  reports that he has never smoked. He has never used smokeless tobacco. He reports current alcohol use. He reports that he does not use drugs.  Allergies:  Allergies  Allergen Reactions   Tape Other (See Comments)    SKIN TEARS VERY EASILY!!!!! Can tolerate ONLY paper tape.   Zolpidem Other (See Comments)    Confusion   Moxifloxacin Rash and Other (See Comments)    Achilles tendon pain also    (Not in a hospital admission)    Physical Exam: Blood pressure 126/71, pulse 64, temperature 97.6 F (36.4 C), temperature source Oral, resp. rate 14, height 5\' 8"  (1.727 m), weight 62.1 kg, SpO2 (!) 88%. General: resting comfortably, appears stated age, no apparent distress Neurological: alert and oriented, no focal deficits, cranial nerves grossly in tact, pupils equal. HEENT: normocephalic, atraumatic, no scleral icterus CV: regular rate and rhythm, extremities warm and well-perfused Respiratory: normal work of breathing on room air, symmetric chest wall expansion. Resolving ecchymosis on the left upper chest wall/left shoulder. Abdomen: soft, nondistended, nontender to deep palpation. Well-healed surgical scars. Extremities: warm and  well-perfused, no deformities, moving all extremities spontaneously Psychiatric: normal mood and affect Skin: warm and dry, no jaundice, no rashes or lesions   Results for orders placed or performed during the hospital encounter of 01/21/24 (from the past 48 hours)  CBC with Differential     Status: Abnormal   Collection Time: 01/21/24  3:52 PM  Result Value Ref Range   WBC 6.8 4.0 - 10.5 K/uL   RBC 2.92 (L) 4.22 - 5.81 MIL/uL   Hemoglobin 9.1 (L) 13.0 - 17.0 g/dL   HCT 13.0 (L) 86.5 - 78.4 %   MCV 96.6 80.0 - 100.0 fL   MCH 31.2 26.0 - 34.0 pg   MCHC 32.3 30.0 - 36.0 g/dL   RDW 69.6 (H) 29.5 - 28.4 %    Platelets 203 150 - 400 K/uL   nRBC 0.0 0.0 - 0.2 %   Neutrophils Relative % 70 %   Neutro Abs 4.8 1.7 - 7.7 K/uL   Lymphocytes Relative 20 %   Lymphs Abs 1.4 0.7 - 4.0 K/uL   Monocytes Relative 7 %   Monocytes Absolute 0.5 0.1 - 1.0 K/uL   Eosinophils Relative 2 %   Eosinophils Absolute 0.1 0.0 - 0.5 K/uL   Basophils Relative 0 %   Basophils Absolute 0.0 0.0 - 0.1 K/uL   Immature Granulocytes 1 %   Abs Immature Granulocytes 0.05 0.00 - 0.07 K/uL    Comment: Performed at [facility], [address], [place], [place] [address]  Comprehensive metabolic panel     Status: Abnormal   Collection Time: 01/21/24  3:52 PM  Result Value Ref Range   Sodium 135 135 - 145 mmol/L   Potassium 3.8 3.5 - 5.1 mmol/L   Chloride 104 98 - 111 mmol/L   CO2 22 22 - 32 mmol/L   Glucose, Bld 90 70 - 99 mg/dL    Comment: Glucose reference range applies only to samples taken after fasting for at least 8 hours.   BUN 30 (H) 8 - 23 mg/dL   Creatinine, Ser 0.10 (H) 0.61 - 1.24 mg/dL   Calcium 8.5 (L) 8.9 - 10.3 mg/dL   Total Protein 5.6 (L) 6.5 - 8.1 g/dL   Albumin 2.7 (L) 3.5 - 5.0 g/dL   AST 35 15 - 41 U/L   ALT 14 0 - 44 U/L   Alkaline Phosphatase 71 38 - 126 U/L   Total Bilirubin 0.8 0.0 - 1.2 mg/dL   GFR, Estimated 22 (L) >60 mL/min    Comment: (NOTE) Calculated using the CKD-EPI Creatinine Equation (2021)    Anion gap 9 5 - 15    Comment: Performed at [facility], [address], [place], [place] [address]  Ethanol     Status: Abnormal   Collection Time: 01/21/24  4:30 PM  Result Value Ref Range   Alcohol, Ethyl (B) 295 (H) <15 mg/dL    Comment: Please note change in reference range. (NOTE) For medical purposes only. Performed at [facility], [address] [place], [place] [address]   Protime-INR     Status: None   Collection Time: 01/21/24  4:30 PM  Result Value Ref Range   Prothrombin Time 14.6 11.4 - 15.2 seconds   INR 1.1 0.8 - 1.2    Comment: (NOTE) INR  goal varies based on device and disease states. Performed at [facility], [address] [place], [place] [address]   Ammonia     Status:  None   Collection Time: 01/21/24  4:30 PM  Result Value Ref Range   Ammonia 15 9 - 35 umol/L    Comment: Performed at [facility], [address] [place], [place] [address]   CT CHEST ABDOMEN PELVIS WO CONTRAST Result Date: 01/21/2024 CLINICAL DATA:  Blunt abdominal trauma EXAM: CT CHEST, ABDOMEN AND PELVIS WITHOUT CONTRAST TECHNIQUE: Multidetector CT imaging of the chest, abdomen and pelvis was performed following the standard protocol without IV contrast. Examination was performed without IV contrast at the request of the ordering clinician. Assessment of the vasculature, soft tissues, and solid viscera is limited without IV contrast, especially in the setting of trauma. RADIATION DOSE REDUCTION: This exam was performed according to the departmental dose-optimization program which includes automated exposure control, adjustment of the mA and/or kV according to patient size and/or use of iterative reconstruction technique. COMPARISON:  01/22/2023, 11/14/2023, 01/21/2024 FINDINGS: CT CHEST FINDINGS Cardiovascular: Unenhanced imaging of the heart demonstrates mild cardiomegaly without pericardial effusion. Normal caliber of the thoracic aorta. Atherosclerosis of the aorta and coronary vasculature. Assessment of the vascular lumen cannot be performed without intravenous contrast. Mediastinum/Nodes: No enlarged mediastinal, hilar, or axillary lymph nodes. Thyroid gland, trachea, and esophagus demonstrate no significant findings. Lungs/Pleura: Bibasilar atelectasis, right greater than left. No acute airspace disease. Minimal right pleural fluid is identified adjacent multiple rib fractures, consistent with small hemothorax. No evidence of pneumothorax. Central airways are patent. Musculoskeletal: There are displaced segmental right third through ninth rib  fractures. There are other prior healed bilateral rib fractures noted. Stable chronic compression deformities at T3 and T9. Prior healed sternal fracture. Reconstructed images demonstrate no additional findings. CT ABDOMEN PELVIS FINDINGS Hepatobiliary: Prior cholecystectomy. Pneumobilia likely related to prior sphincterotomy or biliary duct manipulation. Unremarkable unenhanced appearance of the liver. Pancreas: Unremarkable unenhanced appearance. Spleen: Unremarkable unenhanced appearance. Adrenals/Urinary Tract: No urinary tract calculi or obstructive uropathy. The adrenals and bladder are unremarkable. Stomach/Bowel: No bowel obstruction or ileus. Normal appendix right lower quadrant. No bowel wall thickening or inflammatory change. Vascular/Lymphatic: Aortic atherosclerosis. No enlarged abdominal or pelvic lymph nodes. Reproductive: Prostate is unremarkable. Other: No free fluid or free intraperitoneal gas. No abdominal wall hernia. Musculoskeletal: There are subacute healing fractures through the right superior and inferior pubic rami, right sacral ala, and left parasymphyseal region. Chronic compression deformity noted at the L1 level unchanged. No acute fractures. Severe spondylosis and facet hypertrophy at L4-5 again noted. Reconstructed images demonstrate no additional findings. IMPRESSION: 1. Acute displaced segmental right third through ninth rib fractures, with small associated hemothorax. No evidence of pneumothorax. 2. No acute intra-abdominal or intrapelvic trauma identified on this unenhanced exam. 3. Aortic Atherosclerosis (ICD10-I70.0). Coronary artery atherosclerosis. Electronically Signed   By: Bobbye Burrow M.D.   On: 01/21/2024 18:45   CT Cervical Spine Wo Contrast Result Date: 01/21/2024 CLINICAL DATA:  Neck trauma (Age >= 65y) EXAM: CT CERVICAL SPINE WITHOUT CONTRAST TECHNIQUE: Multidetector CT imaging of the cervical spine was performed without intravenous contrast. Multiplanar CT image  reconstructions were also generated. RADIATION DOSE REDUCTION: This exam was performed according to the departmental dose-optimization program which includes automated exposure control, adjustment of the mA and/or kV according to patient size and/or use of iterative reconstruction technique. COMPARISON:  CT 02/07/2023 FINDINGS: Alignment: No traumatic subluxation. Mild broad-based rightward curvature. Trace retrolisthesis at C3-C4 is unchanged from prior exam. Skull base and vertebrae: No acute fracture. Advanced chronic degenerative change at C1-C2 with pannus. T3 superior endplate compression deformity was present on prior  exam. Soft tissues and spinal canal: No prevertebral fluid or swelling. No visible canal hematoma.21 Disc levels: Diffuse degenerative disc disease with mild multilevel facet hypertrophy. Upper chest: Assessed on concurrent chest CT, reported separately. Other: Scattered air within the soft tissues about the right neck may be related to right rib fractures. IMPRESSION: 1. No acute fracture or subluxation of the cervical spine. 2. Multilevel degenerative change throughout the cervical spine. Electronically Signed   By: Chadwick Colonel M.D.   On: 01/21/2024 18:44   CT Head Wo Contrast Result Date: 01/21/2024 CLINICAL DATA:  Head trauma, minor (Age >= 65y) Status post fall. EXAM: CT HEAD WITHOUT CONTRAST TECHNIQUE: Contiguous axial images were obtained from the base of the skull through the vertex without intravenous contrast. RADIATION DOSE REDUCTION: This exam was performed according to the departmental dose-optimization program which includes automated exposure control, adjustment of the mA and/or kV according to patient size and/or use of iterative reconstruction technique. COMPARISON:  Head CT 02/07/2023 FINDINGS: Brain: No intracranial hemorrhage, mass effect, or midline shift. Stable degree of atrophy and chronic small vessel ischemia. No hydrocephalus. The basilar cisterns are patent.  Chronic bilateral basal gangliar mineralization. No evidence of territorial infarct or acute ischemia. No extra-axial or intracranial fluid collection. Vascular: Atherosclerosis of skullbase vasculature without hyperdense vessel or abnormal calcification. Gas in the cavernous sinus is typically related to IV catheters. Skull: No fracture or focal lesion. Sinuses/Orbits: No acute finding. No evidence of acute fracture. Chronic opacification of lower right mastoid air cells. Other: No confluent scalp hematoma. IMPRESSION: 1. No acute intracranial abnormality. No skull fracture. 2. Stable atrophy and chronic small vessel ischemia. Electronically Signed   By: Chadwick Colonel M.D.   On: 01/21/2024 18:38   DG Chest Portable 1 View Result Date: 01/21/2024 CLINICAL DATA:  Fall on right side. EXAM: PORTABLE CHEST 1 VIEW COMPARISON:  February 12, 2023. FINDINGS: Stable cardiomediastinal silhouette. Left lung is clear. Severely displaced fractures are seen involving the lateral portions of the right third through eighth ribs. Minimal right apical pneumothorax is noted. Minimal right basilar subsegmental atelectasis is noted. IMPRESSION: Minimal right apical pneumothorax is noted secondary to severely displaced fractures involving the lateral portions of the right third through eighth ribs. Electronically Signed   By: Rosalene Colon M.D.   On: 01/21/2024 15:58      Assessment/Plan 73 yo male s/p ground-level fall at home. R 3-9 rib fractures Small right hemothorax  - Multimodal pain control: scheduled robaxin , prn oxycodone  and morphine . Will avoid NSAIDs and gabapentin given CKD. - Aggressive pulmonary toilet, IS - PT/OT ordered - Suspect falls are related to ongoing EtOH use - Given medical comorbidities, request hospitalist admission. Trauma will follow.   Karleen Overall, MD [facility] General, Hepatobiliary and Pancreatic Surgery 01/21/24 7:36 PM

## 2024-01-22 ENCOUNTER — Inpatient Hospital Stay (HOSPITAL_COMMUNITY): Payer: Medicare (Managed Care)

## 2024-01-22 DIAGNOSIS — Z944 Liver transplant status: Secondary | ICD-10-CM | POA: Diagnosis not present

## 2024-01-22 DIAGNOSIS — N1831 Chronic kidney disease, stage 3a: Secondary | ICD-10-CM | POA: Diagnosis not present

## 2024-01-22 DIAGNOSIS — W19XXXA Unspecified fall, initial encounter: Secondary | ICD-10-CM | POA: Diagnosis not present

## 2024-01-22 DIAGNOSIS — I10 Essential (primary) hypertension: Secondary | ICD-10-CM

## 2024-01-22 DIAGNOSIS — F1021 Alcohol dependence, in remission: Secondary | ICD-10-CM | POA: Diagnosis not present

## 2024-01-22 LAB — GLUCOSE, CAPILLARY
Glucose-Capillary: 111 mg/dL — ABNORMAL HIGH (ref 70–99)
Glucose-Capillary: 142 mg/dL — ABNORMAL HIGH (ref 70–99)
Glucose-Capillary: 74 mg/dL (ref 70–99)
Glucose-Capillary: 128 mg/dL — ABNORMAL HIGH (ref 70–99)

## 2024-01-22 LAB — CBC
HCT: 29.3 % — ABNORMAL LOW (ref 39.0–52.0)
HCT: 33.3 % — ABNORMAL LOW (ref 39.0–52.0)
Hemoglobin: 9.8 g/dL — ABNORMAL LOW (ref 13.0–17.0)
Hemoglobin: 10.9 g/dL — ABNORMAL LOW (ref 13.0–17.0)
MCH: 31.8 pg (ref 26.0–34.0)
MCH: 31.1 pg (ref 26.0–34.0)
MCHC: 33.4 g/dL (ref 30.0–36.0)
MCHC: 32.7 g/dL (ref 30.0–36.0)
MCV: 95.1 fL (ref 80.0–100.0)
MCV: 94.9 fL (ref 80.0–100.0)
Platelets: 201 10*3/uL (ref 150–400)
Platelets: 183 10*3/uL (ref 150–400)
RBC: 3.08 MIL/uL — ABNORMAL LOW (ref 4.22–5.81)
RBC: 3.51 MIL/uL — ABNORMAL LOW (ref 4.22–5.81)
RDW: 16.5 % — ABNORMAL HIGH (ref 11.5–15.5)
RDW: 16.4 % — ABNORMAL HIGH (ref 11.5–15.5)
WBC: 4.7 10*3/uL (ref 4.0–10.5)
WBC: 5.6 10*3/uL (ref 4.0–10.5)
nRBC: 0 % (ref 0.0–0.2)
nRBC: 0 % (ref 0.0–0.2)

## 2024-01-22 LAB — COMPREHENSIVE METABOLIC PANEL WITH GFR
ALT: 19 U/L (ref 0–44)
AST: 39 U/L (ref 15–41)
Albumin: 3.1 g/dL — ABNORMAL LOW (ref 3.5–5.0)
Alkaline Phosphatase: 85 U/L (ref 38–126)
Anion gap: 11 (ref 5–15)
BUN: 20 mg/dL (ref 8–23)
CO2: 23 mmol/L (ref 22–32)
Calcium: 9.2 mg/dL (ref 8.9–10.3)
Chloride: 101 mmol/L (ref 98–111)
Creatinine, Ser: 2.5 mg/dL — ABNORMAL HIGH (ref 0.61–1.24)
GFR, Estimated: 27 mL/min — ABNORMAL LOW (ref >60)
Glucose, Bld: 104 mg/dL — ABNORMAL HIGH (ref 70–99)
Potassium: 4.1 mmol/L (ref 3.5–5.1)
Sodium: 135 mmol/L (ref 135–145)
Total Bilirubin: 1.4 mg/dL — ABNORMAL HIGH (ref 0.0–1.2)
Total Protein: 6.5 g/dL (ref 6.5–8.1)

## 2024-01-22 LAB — CBC WITH DIFFERENTIAL/PLATELET
Abs Immature Granulocytes: 0.01 10*3/uL (ref 0.00–0.07)
Basophils Absolute: 0 10*3/uL (ref 0.0–0.1)
Basophils Relative: 0 %
Eosinophils Absolute: 0 10*3/uL (ref 0.0–0.5)
Eosinophils Relative: 1 %
HCT: 30.2 % — ABNORMAL LOW (ref 39.0–52.0)
Hemoglobin: 9.9 g/dL — ABNORMAL LOW (ref 13.0–17.0)
Immature Granulocytes: 0 %
Lymphocytes Relative: 16 %
Lymphs Abs: 0.8 10*3/uL (ref 0.7–4.0)
MCH: 30.9 pg (ref 26.0–34.0)
MCHC: 32.8 g/dL (ref 30.0–36.0)
MCV: 94.4 fL (ref 80.0–100.0)
Monocytes Absolute: 0.6 10*3/uL (ref 0.1–1.0)
Monocytes Relative: 11 %
Neutro Abs: 3.5 10*3/uL (ref 1.7–7.7)
Neutrophils Relative %: 72 %
Platelets: 204 10*3/uL (ref 150–400)
RBC: 3.2 MIL/uL — ABNORMAL LOW (ref 4.22–5.81)
RDW: 16.7 % — ABNORMAL HIGH (ref 11.5–15.5)
WBC: 4.9 10*3/uL (ref 4.0–10.5)
nRBC: 0 % (ref 0.0–0.2)

## 2024-01-22 LAB — PHOSPHORUS: Phosphorus: 2.9 mg/dL (ref 2.5–4.6)

## 2024-01-22 LAB — CREATININE, SERUM
Creatinine, Ser: 2.72 mg/dL — ABNORMAL HIGH (ref 0.61–1.24)
GFR, Estimated: 24 mL/min — ABNORMAL LOW (ref >60)

## 2024-01-22 LAB — HEMOGLOBIN A1C
Hgb A1c MFr Bld: 4.3 % — ABNORMAL LOW (ref 4.8–5.6)
Mean Plasma Glucose: 76.71 mg/dL

## 2024-01-22 LAB — MAGNESIUM: Magnesium: 1.5 mg/dL — ABNORMAL LOW (ref 1.7–2.4)

## 2024-01-22 MED ORDER — MAGNESIUM SULFATE 4 GM/100ML IV SOLN
4.0000 g | Freq: Once | INTRAVENOUS | Status: AC
  Administered 2024-01-22: 4 g via INTRAVENOUS
  Filled 2024-01-22: qty 100

## 2024-01-22 MED ORDER — TACROLIMUS 1 MG PO CAPS
1.0000 mg | ORAL_CAPSULE | Freq: Two times a day (BID) | ORAL | Status: DC
Start: 2024-01-22 — End: 2024-01-24
  Administered 2024-01-22 – 2024-01-24 (×5): 1 mg via ORAL
  Filled 2024-01-22 (×6): qty 1

## 2024-01-22 NOTE — Progress Notes (Signed)
 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  He reports that he drank about a pint of liquor and was found to be hypotensive with SBP in the 90s.  He received IV fluids in the ED and complained of right-sided chest wall pain and further workup was done and he was found to have an acute right 3 through 9 rib fractures.  Trauma surgery was consulted and recommending multimodal management.  Assessment and Plan:  Fall in the setting of Alcohol Intoxication: Patient was hypotensive when EMS arrived.  Most likely as result of alcoholism.  Patient is being aggressively hydrated.  Will admit.  Pain control.  Counseled on alcohol intake.  PT OT consulted and recommending HH   Acute Ribs 3-9 Fx with small Right Hemothorax in the setting of Fall Patient will be admitted for pain management.  Initiate incentive spirometer with pain control.  PT/OT recommend HH.  On IV fentanyl now and also getting oxycodone  and Robaxin .  Repeat chest x-ray done and shows mild right effusion hemothorax is stable.  Per general surgery repeat film based on clinical issues and will not repeat unless she develops change.   Alcohol Abuse: Initiate CIWA protocol w/ Lorazepam  and CTM for Withdrawls.  Counseling provided   History of Liver Transplant x2: Resume Home Tacrolimus . CTM    AKI on CKD Stage 3b: BUN/Cr Trend: Recent Labs  Lab 01/21/24 1552 01/22/24 0002 01/22/24 0847  BUN 30*  --  20  CREATININE 2.99* 2.72* 2.50*  -IV fluid hydration with D5 and LR at 100 mL/h x 1 day -Avoid Nephrotoxic Medications, Contrast Dyes, Hypotension and  Dehydration to Ensure Adequate Renal Perfusion and will need to Renally Adjust Meds. CTM and Trend Renal Function carefully and repeat CMP in the AM   Hyperbilirubinemia: Mild and likely reactive. T Bili is 1.4. CTM and Trend and repeat CMP in the AM  Essential HTN: Will resume home regimen continue Amlodipine  5 mg p.o. daily and Metoprolol  Tartrate 50 g p.o. twice daily. CTM BP per Protocol. Last BP reading was 157/85  Normocytic Anemia/Anemia of Chronic Disease: Hgb/Hct Trend:  Recent Labs  Lab 01/21/24 1552 01/22/24 0002 01/22/24 0847 01/22/24 0900  HGB 9.1* 9.8* 10.9* 9.9*  HCT 28.2* 29.3* 33.3* 30.2*  MCV 96.6 95.1 94.9 94.4  -Check Anemia Panel in the AM. CTM for S/Sx of Bleeding; No overt bleeding noted. Repeat CBC in the AM   Hypoalbuminemia: Patient's Albumin Level went from 2.7 -> 3.1. CTM and Trend Repeat CMP in the AM   DVT prophylaxis: enoxaparin (LOVENOX) injection 30 mg Start: 01/21/24 2215    Code Status: Full Code Family Communication: No family currently at bedside  Disposition Plan:  Level of care: Med-Surg Status is: Inpatient Remains inpatient appropriate because: Needs further clinical improvement adequate pain control given his multiple rib fractures ensure that he is not withdrawing from alcohol   Consultants:  Trauma Surgery  Procedures:  As delineated as above  Antimicrobials:  Anti-infectives (From admission, onward)    None       Subjective: Seen and examined at bedside and states that  he is having pain in his right side of his ribs partially when he takes a deep breath or coughs.  No lightheadedness or dizziness.  Feels okay otherwise.  Denies any other pain besides t the pain in the right side of his chest.  Objective: Vitals:   01/21/24 2349 01/22/24 0550 01/22/24 0804 01/22/24 1639  BP: 137/86 (!) 164/88 (!) 173/109 (!) 157/85  Pulse: 68 74 84 76  Resp: 16  17 17   Temp: (!) 97.5 F (36.4 C) 98.3 F (36.8 C) 98 F (36.7 C) (!) 97.5  F (36.4 C)  TempSrc: Oral Oral Oral Oral  SpO2: 100% 95% 93% 93%  Weight:      Height:        Intake/Output Summary (Last 24 hours) at 01/22/2024 1828 Last data filed at 01/22/2024 1522 Gross per 24 hour  Intake 1720.92 ml  Output 450 ml  Net 1270.92 ml   Filed Weights   01/21/24 1550  Weight: 62.1 kg   Examination: Physical Exam:  Constitutional: Thin Caucasian elderly male who appears older than his stated age who appears a little uncomfortable Respiratory: Diminished to auscultation bilaterally with some coarse breath sounds and does have a slight crackle no appreciable wheezing or rales.Normal respiratory effort and patient is not tachypenic. No accessory muscle use.  Unlabored breathing is not wearing supplemental oxygen nasal cannula Cardiovascular: RRR, no murmurs / rubs / gallops. S1 and S2 auscultated. No extremity edema. Abdomen: Soft, non-tender, non-distended. Bowel sounds positive.  GU: Deferred. Musculoskeletal: No clubbing / cyanosis of digits/nails. No joint deformity upper and lower extremities. Skin: No rashes, lesions, ulcers on a limited skin evaluation. No induration; Warm and dry.  Neurologic: CN 2-12 grossly intact with no focal deficits. Romberg sign and cerebellar reflexes not assessed.  Psychiatric: Normal judgment and insight. Awake and Alert  Data Reviewed: I have personally reviewed following labs and imaging studies  CBC: Recent Labs  Lab 01/21/24 1552 01/22/24 0002 01/22/24 0847 01/22/24 0900  WBC 6.8 4.7 5.6 4.9  NEUTROABS 4.8  --   --  3.5  HGB 9.1* 9.8* 10.9* 9.9*  HCT 28.2* 29.3* 33.3* 30.2*  MCV 96.6 95.1 94.9 94.4  PLT 203 201 183 204   Basic Metabolic Panel: Recent Labs  Lab 01/21/24 1552 01/22/24 0002 01/22/24 0847 01/22/24 0900  NA 135  --  135  --   K 3.8  --  4.1  --   CL 104  --  101  --   CO2 22  --  23  --   GLUCOSE 90  --  104*  --   BUN 30*  --  20  --   CREATININE 2.99* 2.72* 2.50*  --   CALCIUM 8.5*  --  9.2   --   MG  --   --   --  1.5*  PHOS  --   --   --  2.9   GFR: Estimated Creatinine Clearance: 24.2 mL/min (A) (by C-G formula based on SCr of 2.5 mg/dL (H)). Liver Function Tests: Recent Labs  Lab 01/21/24 1552 01/22/24 0847  AST 35 39  ALT 14 19  ALKPHOS 71 85  BILITOT 0.8 1.4*  PROT 5.6* 6.5  ALBUMIN 2.7* 3.1*   No results for input(s): "LIPASE", "AMYLASE" in the last 168 hours. Recent Labs  Lab 01/21/24 1630  AMMONIA 15   Coagulation Profile: Recent Labs  Lab 01/21/24 1630  INR 1.1   Cardiac Enzymes: No results for input(s): "CKTOTAL", "CKMB", "CKMBINDEX", "  TROPONINI" in the last 168 hours. BNP (last 3 results) No results for input(s): "PROBNP" in the last 8760 hours. HbA1C: Recent Labs    01/22/24 0002  HGBA1C 4.3*   CBG: Recent Labs  Lab 01/21/24 2133 01/22/24 0800 01/22/24 1132 01/22/24 1636  GLUCAP 95 111* 142* 74   Lipid Profile: No results for input(s): "CHOL", "HDL", "LDLCALC", "TRIG", "CHOLHDL", "LDLDIRECT" in the last 72 hours. Thyroid Function Tests: No results for input(s): "TSH", "T4TOTAL", "FREET4", "T3FREE", "THYROIDAB" in the last 72 hours. Anemia Panel: No results for input(s): "VITAMINB12", "FOLATE", "FERRITIN", "TIBC", "IRON", "RETICCTPCT" in the last 72 hours. Sepsis Labs: No results for input(s): "PROCALCITON", "LATICACIDVEN" in the last 168 hours.  No results found for this or any previous visit (from the past 240 hours).   Radiology Studies: DG CHEST PORT 1 VIEW Result Date: 01/22/2024 CLINICAL DATA:  Right hemothorax. EXAM: PORTABLE CHEST 1 VIEW COMPARISON:  Jan 21, 2024. FINDINGS: Stable cardiomediastinal silhouette. Stable right rib fractures. No definite pneumothorax is noted currently. Minimal right basilar subsegmental atelectasis is noted with small pleural effusion or hemothorax. Minimal left basilar subsegmental atelectasis is noted. IMPRESSION: Stable right rib fractures are noted with minimal right basilar subsegmental  atelectasis with small right pleural effusion or hemothorax. Electronically Signed   By: Rosalene Colon M.D.   On: 01/22/2024 11:56   CT CHEST ABDOMEN PELVIS WO CONTRAST Result Date: 01/21/2024 CLINICAL DATA:  Blunt abdominal trauma EXAM: CT CHEST, ABDOMEN AND PELVIS WITHOUT CONTRAST TECHNIQUE: Multidetector CT imaging of the chest, abdomen and pelvis was performed following the standard protocol without IV contrast. Examination was performed without IV contrast at the request of the ordering clinician. Assessment of the vasculature, soft tissues, and solid viscera is limited without IV contrast, especially in the setting of trauma. RADIATION DOSE REDUCTION: This exam was performed according to the departmental dose-optimization program which includes automated exposure control, adjustment of the mA and/or kV according to patient size and/or use of iterative reconstruction technique. COMPARISON:  01/22/2023, 11/14/2023, 01/21/2024 FINDINGS: CT CHEST FINDINGS Cardiovascular: Unenhanced imaging of the heart demonstrates mild cardiomegaly without pericardial effusion. Normal caliber of the thoracic aorta. Atherosclerosis of the aorta and coronary vasculature. Assessment of the vascular lumen cannot be performed without intravenous contrast. Mediastinum/Nodes: No enlarged mediastinal, hilar, or axillary lymph nodes. Thyroid gland, trachea, and esophagus demonstrate no significant findings. Lungs/Pleura: Bibasilar atelectasis, right greater than left. No acute airspace disease. Minimal right pleural fluid is identified adjacent multiple rib fractures, consistent with small hemothorax. No evidence of pneumothorax. Central airways are patent. Musculoskeletal: There are displaced segmental right third through ninth rib fractures. There are other prior healed bilateral rib fractures noted. Stable chronic compression deformities at T3 and T9. Prior healed sternal fracture. Reconstructed images demonstrate no additional  findings. CT ABDOMEN PELVIS FINDINGS Hepatobiliary: Prior cholecystectomy. Pneumobilia likely related to prior sphincterotomy or biliary duct manipulation. Unremarkable unenhanced appearance of the liver. Pancreas: Unremarkable unenhanced appearance. Spleen: Unremarkable unenhanced appearance. Adrenals/Urinary Tract: No urinary tract calculi or obstructive uropathy. The adrenals and bladder are unremarkable. Stomach/Bowel: No bowel obstruction or ileus. Normal appendix right lower quadrant. No bowel wall thickening or inflammatory change. Vascular/Lymphatic: Aortic atherosclerosis. No enlarged abdominal or pelvic lymph nodes. Reproductive: Prostate is unremarkable. Other: No free fluid or free intraperitoneal gas. No abdominal wall hernia. Musculoskeletal: There are subacute healing fractures through the right superior and inferior pubic rami, right sacral ala, and left parasymphyseal region. Chronic compression deformity noted at the L1 level unchanged. No acute fractures. Severe  spondylosis and facet hypertrophy at L4-5 again noted. Reconstructed images demonstrate no additional findings. IMPRESSION: 1. Acute displaced segmental right third through ninth rib fractures, with small associated hemothorax. No evidence of pneumothorax. 2. No acute intra-abdominal or intrapelvic trauma identified on this unenhanced exam. 3. Aortic Atherosclerosis (ICD10-I70.0). Coronary artery atherosclerosis. Electronically Signed   By: Bobbye Burrow M.D.   On: 01/21/2024 18:45   CT Cervical Spine Wo Contrast Result Date: 01/21/2024 CLINICAL DATA:  Neck trauma (Age >= 65y) EXAM: CT CERVICAL SPINE WITHOUT CONTRAST TECHNIQUE: Multidetector CT imaging of the cervical spine was performed without intravenous contrast. Multiplanar CT image reconstructions were also generated. RADIATION DOSE REDUCTION: This exam was performed according to the departmental dose-optimization program which includes automated exposure control, adjustment of  the mA and/or kV according to patient size and/or use of iterative reconstruction technique. COMPARISON:  CT 02/07/2023 FINDINGS: Alignment: No traumatic subluxation. Mild broad-based rightward curvature. Trace retrolisthesis at C3-C4 is unchanged from prior exam. Skull base and vertebrae: No acute fracture. Advanced chronic degenerative change at C1-C2 with pannus. T3 superior endplate compression deformity was present on prior exam. Soft tissues and spinal canal: No prevertebral fluid or swelling. No visible canal hematoma.21 Disc levels: Diffuse degenerative disc disease with mild multilevel facet hypertrophy. Upper chest: Assessed on concurrent chest CT, reported separately. Other: Scattered air within the soft tissues about the right neck may be related to right rib fractures. IMPRESSION: 1. No acute fracture or subluxation of the cervical spine. 2. Multilevel degenerative change throughout the cervical spine. Electronically Signed   By: Chadwick Colonel M.D.   On: 01/21/2024 18:44   CT Head Wo Contrast Result Date: 01/21/2024 CLINICAL DATA:  Head trauma, minor (Age >= 65y) Status post fall. EXAM: CT HEAD WITHOUT CONTRAST TECHNIQUE: Contiguous axial images were obtained from the base of the skull through the vertex without intravenous contrast. RADIATION DOSE REDUCTION: This exam was performed according to the departmental dose-optimization program which includes automated exposure control, adjustment of the mA and/or kV according to patient size and/or use of iterative reconstruction technique. COMPARISON:  Head CT 02/07/2023 FINDINGS: Brain: No intracranial hemorrhage, mass effect, or midline shift. Stable degree of atrophy and chronic small vessel ischemia. No hydrocephalus. The basilar cisterns are patent. Chronic bilateral basal gangliar mineralization. No evidence of territorial infarct or acute ischemia. No extra-axial or intracranial fluid collection. Vascular: Atherosclerosis of skullbase  vasculature without hyperdense vessel or abnormal calcification. Gas in the cavernous sinus is typically related to IV catheters. Skull: No fracture or focal lesion. Sinuses/Orbits: No acute finding. No evidence of acute fracture. Chronic opacification of lower right mastoid air cells. Other: No confluent scalp hematoma. IMPRESSION: 1. No acute intracranial abnormality. No skull fracture. 2. Stable atrophy and chronic small vessel ischemia. Electronically Signed   By: Chadwick Colonel M.D.   On: 01/21/2024 18:38   DG Chest Portable 1 View Result Date: 01/21/2024 CLINICAL DATA:  Fall on right side. EXAM: PORTABLE CHEST 1 VIEW COMPARISON:  February 12, 2023. FINDINGS: Stable cardiomediastinal silhouette. Left lung is clear. Severely displaced fractures are seen involving the lateral portions of the right third through eighth ribs. Minimal right apical pneumothorax is noted. Minimal right basilar subsegmental atelectasis is noted. IMPRESSION: Minimal right apical pneumothorax is noted secondary to severely displaced fractures involving the lateral portions of the right third through eighth ribs. Electronically Signed   By: Rosalene Colon M.D.   On: 01/21/2024 15:58   Scheduled Meds:  amLODipine   5 mg Oral Daily  enoxaparin (LOVENOX) injection  30 mg Subcutaneous Q24H   famotidine   10 mg Oral QHS   folic acid   1 mg Oral Daily   insulin aspart  0-15 Units Subcutaneous TID WC   insulin aspart  0-5 Units Subcutaneous QHS   methocarbamol   500 mg Oral TID   metoprolol  tartrate  50 mg Oral BID   multivitamin with minerals  1 tablet Oral Daily   tacrolimus   1 mg Oral BID   tamsulosin   0.4 mg Oral Daily   thiamine   100 mg Oral Daily   Or   thiamine   100 mg Intravenous Daily   Continuous Infusions:  dextrose  5% lactated ringers  100 mL/hr at 01/22/24 1522   magnesium  sulfate bolus IVPB      LOS: 1 day   Aura Leeds, DO [facility] Available via Epic secure chat 7am-7pm After these hours, please  refer to coverage provider listed on [email or web address] 01/22/2024, 6:28 PM

## 2024-01-22 NOTE — Evaluation (Signed)
 Physical Therapy Evaluation Patient Details Name: [name] MRN: [id number] DOB: November 20, 1950 Today's Date: 01/22/2024  History of Present Illness  Pt is a 73 y.o. male admitted 5/17 for fall in home while intoxicated. Imaging showed R ribs 3-9 fxs and Small hemothorax. PMH: alcohol use, cirrhosis s/p liver transplant x2, HTN  Clinical Impression  Pt in bed upon arrival and agreeable to limited PT eval. PTA, pt would ambulate with no AD or RW. History of multiple falls with pt reporting an increased fear of falling. With encouragement, pt was agreeable to attempt to move towards the EOB. He required CGA to roll while holding a folded towel onto R ribs. Pt reports a significant increase in pain and declined further transfer. Discussed need for pt to progress to ambulating and stair negotiation with pt agreeing to work with PT in future sessions. Pt has 4 steps to enter home which could be a potential barrier to d/c. Anticipating pt will progress well with continued mobility and pain management. Pt will have 24/7 physical assist available upon d/c home with recommendation for HHPT. Pt currently with functional limitations due to the deficits listed below (see PT Problem List). Pt would benefit from acute skilled PT to address functional impairments. Acute PT to follow.         If plan is discharge home, recommend the following: A little help with walking and/or transfers;A little help with bathing/dressing/bathroom;Assistance with cooking/housework;Assist for transportation;Help with stairs or ramp for entrance     Equipment Recommendations None recommended by PT     Functional Status Assessment Patient has had a recent decline in their functional status and demonstrates the ability to make significant improvements in function in a reasonable and predictable amount of time.     Precautions / Restrictions Precautions Precautions: Fall Recall of Precautions/Restrictions:  Intact Precaution/Restrictions Comments: R rib fxs Restrictions Weight Bearing Restrictions Per Provider Order: No      Mobility  Bed Mobility Overal bed mobility: Needs Assistance Bed Mobility: Rolling Rolling: Contact guard assist      General bed mobility comments: CGA to roll onto L side while pressing folded towel onto ribs for comfort. Declined further transfer due to pain    Transfers  General transfer comment: Pt declined            Pertinent Vitals/Pain Pain Assessment Pain Assessment: Faces Faces Pain Scale: Hurts whole lot Pain Location: ribs Pain Descriptors / Indicators: Discomfort Pain Intervention(s): Monitored during session, Limited activity within patient's tolerance, Repositioned    Home Living Family/patient expects to be discharged to:: Private residence Living Arrangements: Spouse/significant other Available Help at Discharge: Family;Available 24 hours/day Type of Home: House Home Access: Stairs to enter Entrance Stairs-Rails: Right;Left Entrance Stairs-Number of Steps: 4 Alternate Level Stairs-Number of Steps: 14 Home Layout: Two level;Bed/bath upstairs;1/2 bath on main level;Able to live on main level with bedroom/bathroom Home Equipment: Rolling Walker (2 wheels);Rollator (4 wheels) Additional Comments: Was staying on ground floor and sleeping on sectional prior to admit    Prior Function Prior Level of Function : Independent/Modified Independent;History of Falls (last six months)    Mobility Comments: Pt reports using RW prn, mostly no AD. History of multiple falls with fear of falling       Extremity/Trunk Assessment     Lower Extremity Assessment Lower Extremity Assessment: Overall WFL for tasks assessed    Cervical / Trunk Assessment Cervical / Trunk Assessment: Kyphotic  Communication   Communication Communication: No apparent difficulties    Cognition  Arousal: Alert Behavior During Therapy: WFL for tasks  assessed/performed   PT - Cognitive impairments: No apparent impairments    Following commands: Intact       Cueing Cueing Techniques: Verbal cues      PT Assessment Patient needs continued PT services  PT Problem List Decreased activity tolerance;Decreased balance;Decreased mobility       PT Treatment Interventions DME instruction;Gait training;Stair training;Functional mobility training;Therapeutic activities;Therapeutic exercise;Balance training;Neuromuscular re-education;Patient/family education    PT Goals (Current goals can be found in the Care Plan section)  Acute Rehab PT Goals Patient Stated Goal: to go home PT Goal Formulation: With patient Time For Goal Achievement: 02/05/24 Potential to Achieve Goals: Good    Frequency Min 2X/week        AM-PAC PT "6 Clicks" Mobility  Outcome Measure Help needed turning from your back to your side while in a flat bed without using bedrails?: A Little Help needed moving from lying on your back to sitting on the side of a flat bed without using bedrails?: A Little Help needed moving to and from a bed to a chair (including a wheelchair)?: A Little Help needed standing up from a chair using your arms (e.g., wheelchair or bedside chair)?: A Little Help needed to walk in hospital room?: A Lot Help needed climbing 3-5 steps with a railing? : A Lot 6 Click Score: 16    End of Session   Activity Tolerance: Patient limited by pain Patient left: in bed;with call bell/phone within reach Nurse Communication: Mobility status PT Visit Diagnosis: Unsteadiness on feet (R26.81);Other abnormalities of gait and mobility (R26.89);Muscle weakness (generalized) (M62.81)    Time: 1610-9604 PT Time Calculation (min) (ACUTE ONLY): 16 min   Charges:   PT Evaluation $PT Eval Low Complexity: 1 Low   PT General Charges $$ ACUTE PT VISIT: 1 Visit       Orysia Blas, PT, DPT Secure Chat Preferred  Rehab Office [phone]   Alissa April  Adela Ades 01/22/2024, 5:23 PM

## 2024-01-22 NOTE — Hospital Course (Addendum)
 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  He reports that he drank about a pint of liquor and was found to be hypotensive with SBP in the 90s.  He received IV fluids in the ED and complained of right-sided chest wall pain and further workup was done and he was found to have an acute right 3 through 9 rib fractures.  Trauma surgery was consulted and recommending multimodal management.  Continued to complain of pain with deep inspiration with improvement with Oxy.  Trauma surgery is now signing off as of 01/23/2024.  Patient was ambulating with physical therapy and home O2 screen was done and did not desaturate.  Chest x-ray remained stable and he did not show any signs of withdrawal.  He is medically stable for discharge at this time and will need continued incentive spirometry and discontinue drinking.  Pain is fairly well-controlled on oral oxycodone  so he will be discharged home on a short course with PCP to refill  Assessment and Plan:  Fall in the setting of Alcohol Intoxication: Patient was hypotensive when EMS arrived.  Most likely as result of alcoholism.  Patient is being aggressively hydrated and blood pressure improved..  Will admit.  Pain control.  Counseled on alcohol intake.  PT OT consulted and recommending HH.  Was not orthostatic at the time of discharge and ambulatory home O2 screen was done and he did not require oxygen.   Acute Ribs 3-9 Fx with small Right Hemothorax in the setting of Fall Patient will be admitted for pain management.  Initiate incentive spirometer with pain control.  PT/OT recommend HH.  On IV fentanyl  now and also getting oxycodone  and Robaxin .  Repeat chest x-ray done 5/18 and shows mild right  effusion hemothorax is stable.  Repeat chest x-ray today was stable and showed Probable trace bilateral pleural effusions and bibasilar atelectasis or infiltrate.  Continue with Incentive Spirometry patient was able to do more today.  Repeat chest x-ray in 3 to 6 weeks   Alcohol Abuse: Initiate CIWA protocol w/ Lorazepam  and CTM for Withdrawls.  Counseling provided.  No signs of withdrawal at the time of discharge.  Hypoxemia: In the setting of Rib Fx's.  He remainedon 2 L supplemental oxygen via nasal cannula and this was removed for assessment and his O2 sats decreased to 89 to 91%.  This is reapplied and his O2 saturations are improved.  Repeat chest x-ray as above and ambulatory home O2 screen done and he did not desaturate.   History of Liver Transplant x2: Resume Home Tacrolimus . CTM    AKI on CKD Stage 3b: BUN/Cr Trend went from 30/2.99 -> 20/2.50 -> 17/2.32 and is stable at the time of discharge at 17/2.3 . IV fluid hydration with D5 and LR at 100 mL/h x 1 day now stopped. Avoid Nephrotoxic Medications, Contrast Dyes, Hypotension and Dehydration to Ensure Adequate Renal Perfusion and will need to Renally Adjust Meds. CTM and Trend Renal Function carefully and repeat CMP in the AM   Constipation: Bowel regimen initiated w/ Senna-Docusate 1 tab po BID, Miralax  17 grams BID, and Bisacodyl  Suppository 10 mg RC.  Continue with bowel regimen outpatient setting  Hyperbilirubinemia: Mild and improved. Likely was reactive. T Bili  went from 1.4 -> 1.1 and is 1.0.. CTM and Trend and repeat CMP in the AM  Hyperkalemia: Mild. K+ is 5.2 yesterday and he was given 10 g of Lokelma  x1 with improvement and now his potassium is 4.0. CTM and Trend and repeat CMP within 1 week  HypoNa+: Mild. Na+ is now went from 134 is now 132. CTM and Trend and repeat CMP within 1 week  Essential HTN: Will resume home regimen continue Amlodipine  5 mg p.o. daily and Metoprolol  Tartrate 50 g p.o. twice daily. CTM BP per  Protocol. Last BP reading was a little elevated 167/83  Normocytic Anemia/Anemia of Chronic Disease: Hgb/Hct Trend fairly stable:  Recent Labs  Lab 01/21/24 1552 01/22/24 0002 01/22/24 0847 01/22/24 0900 01/23/24 0935 01/24/24 0810  HGB 9.1* 9.8* 10.9* 9.9* 9.9* 10.1*  HCT 28.2* 29.3* 33.3* 30.2* 30.4* 31.2*  MCV 96.6 95.1 94.9 94.4 95.6 95.1  -Checked Anemia Panel and it showed an iron level 18, UIBC 143, TIBC 161, saturation ration of 11%, ferritin of 1037, folate of 27.4, vitamin B12 533.  Initiate iron supplementation in outpatient setting CTM for S/Sx of Bleeding; No overt bleeding noted. Repeat CBC in the AM   Hypoalbuminemia: Patient's Albumin Level went from 2.7 -> 3.1 -> 2.7 and is now 2.8. CTM and Trend Repeat CMP in the AM

## 2024-01-22 NOTE — Evaluation (Signed)
 Occupational Therapy Evaluation Patient Details Name: [name] MRN: [id number] DOB: 1951/08/08 Today's Date: 01/22/2024   History of Present Illness   Pt is a 73 y.o. male admitted 5/17 for fall in home while intoxicated. Imaging showed R ribs 3-9 fxs and Small hemothorax. PMH: alcohol use, cirrhosis s/p liver transplant x2, HTN     Clinical Impressions Pt admitted based on above, and was seen based on problem list below. PTA pt was living with his significant other and was independent with ADLs and IADLs. Today pt is requiring set up  to CGA for ADLs. Bed mobility was min assist and functional transfers are  CGA. Pt limited d/t pain, but tolerating treatment well. Pt acknowledges functional deficits, but shows poor judgement regarding safety. Recommendation of HHOT to promote safety within the home and reduce fall risk. OT will continue to follow acutely to maximize functional independence.        If plan is discharge home, recommend the following:   A little help with walking and/or transfers;A little help with bathing/dressing/bathroom;Help with stairs or ramp for entrance     Functional Status Assessment   Patient has had a recent decline in their functional status and demonstrates the ability to make significant improvements in function in a reasonable and predictable amount of time.     Equipment Recommendations   BSC/3in1      Precautions/Restrictions   Precautions Precautions: Fall Recall of Precautions/Restrictions: Intact Restrictions Weight Bearing Restrictions Per Provider Order: No     Mobility Bed Mobility Overal bed mobility: Needs Assistance Bed Mobility: Supine to Sit     Supine to sit: Min assist     General bed mobility comments: Min HH assist    Transfers Overall transfer level: Needs assistance Equipment used: Rolling walker (2 wheels) Transfers: Sit to/from Stand, Bed to chair/wheelchair/BSC Sit to Stand: Contact guard  assist     Step pivot transfers: Contact guard assist     General transfer comment: CGA for balance able to complete with increased time      Balance Overall balance assessment: Needs assistance Sitting-balance support: Bilateral upper extremity supported, Feet supported Sitting balance-Leahy Scale: Fair     Standing balance support: Bilateral upper extremity supported, During functional activity, Reliant on assistive device for balance Standing balance-Leahy Scale: Poor       ADL either performed or assessed with clinical judgement   ADL Overall ADL's : Needs assistance/impaired Eating/Feeding: Set up;Sitting   Grooming: Set up;Sitting           Upper Body Dressing : Set up;Sitting   Lower Body Dressing: Contact guard assist;Sit to/from stand Lower Body Dressing Details (indicate cue type and reason): Able to figure 4 for socks, CGA for balance with standing Toilet Transfer: Contact guard assist;Ambulation;Rolling walker (2 wheels) Toilet Transfer Details (indicate cue type and reason): Simulated in room Toileting- Clothing Manipulation and Hygiene: Sit to/from stand Toileting - Clothing Manipulation Details (indicate cue type and reason): Adequate standing balance     Functional mobility during ADLs: Contact guard assist;Rolling walker (2 wheels) General ADL Comments: painful but able to complete     Vision Baseline Vision/History: 0 No visual deficits Vision Assessment?: No apparent visual deficits            Pertinent Vitals/Pain Pain Assessment Pain Assessment: Faces Faces Pain Scale: Hurts little more Pain Location: ribs Pain Descriptors / Indicators: Discomfort Pain Intervention(s): Monitored during session     Extremity/Trunk Assessment Upper Extremity Assessment Upper Extremity Assessment: Generalized weakness;RUE  deficits/detail RUE Deficits / Details: Pain in R chest d/t rib fxs, WFL RUE: Shoulder pain with ROM   Lower Extremity  Assessment Lower Extremity Assessment: Defer to PT evaluation   Cervical / Trunk Assessment Cervical / Trunk Assessment: Kyphotic   Communication Communication Communication: No apparent difficulties   Cognition Arousal: Alert Behavior During Therapy: WFL for tasks assessed/performed Cognition: No apparent impairments       OT - Cognition Comments: Pt with poor judgement with safety, acknowledges deficits, but poor decisions with safety     Following commands: Intact       Cueing  General Comments   Cueing Techniques: Verbal cues  VSS on RA           Home Living Family/patient expects to be discharged to:: Private residence Living Arrangements: Spouse/significant other Available Help at Discharge: Family;Available 24 hours/day Type of Home: House Home Access: Stairs to enter [employer] of Steps: 4 Entrance Stairs-Rails: Right;Left Home Layout: Two level;Bed/bath upstairs;1/2 bath on main level Alternate Level Stairs-Number of Steps: 14 Alternate Level Stairs-Rails: Right;Left;Can reach both Bathroom Shower/Tub: Chief Strategy Officer: Handicapped height Bathroom Accessibility: Yes How Accessible: Accessible via walker Home Equipment: Rolling Walker (2 wheels);Rollator (4 wheels)          Prior Functioning/Environment Prior Level of Function : Independent/Modified Independent       Mobility Comments: Pt reports using RW prn, mostly no AD, frequent falls      OT Problem List: Decreased strength;Decreased range of motion;Decreased activity tolerance;Impaired balance (sitting and/or standing)   OT Treatment/Interventions: Self-care/ADL training;Therapeutic exercise;Energy conservation;DME and/or AE instruction;Patient/family education;Balance training      OT Goals(Current goals can be found in the care plan section)   Acute Rehab OT Goals Patient Stated Goal: To go home OT Goal Formulation: With patient Time For Goal  Achievement: 02/05/24 Potential to Achieve Goals: Good   OT Frequency:  Min 2X/week       AM-PAC OT "6 Clicks" Daily Activity     Outcome Measure Help from another person eating meals?: None Help from another person taking care of personal grooming?: A Little Help from another person toileting, which includes using toliet, bedpan, or urinal?: A Little Help from another person bathing (including washing, rinsing, drying)?: A Little Help from another person to put on and taking off regular upper body clothing?: A Little Help from another person to put on and taking off regular lower body clothing?: A Little 6 Click Score: 19   End of Session Equipment Utilized During Treatment: Rolling walker (2 wheels) Nurse Communication: Mobility status  Activity Tolerance: Patient tolerated treatment well Patient left: in chair;with call bell/phone within reach;with chair alarm set  OT Visit Diagnosis: Unsteadiness on feet (R26.81);Other abnormalities of gait and mobility (R26.89);Repeated falls (R29.6);History of falling (Z91.81);Muscle weakness (generalized) (M62.81)                Time: 4098-1191 OT Time Calculation (min): 39 min Charges:  OT General Charges $OT Visit: 1 Visit OT Evaluation $OT Eval Low Complexity: 1 Low OT Treatments $Self Care/Home Management : 23-37 mins  Delmer Ferraris, OT  Acute Rehabilitation Services Office [phone] Secure chat preferred   Mickael Alamo 01/22/2024, 10:34 AM

## 2024-01-22 NOTE — Progress Notes (Signed)
 Subjective/Chief Complaint: Patient complains of pain with deep inspiration. Oxy helps some.  No n/v.  Has been OOB in chair but not walking yet.    Objective: Vital signs in last 24 hours: Temp:  [97.5 F (36.4 C)-98.3 F (36.8 C)] 98 F (36.7 C) (05/18 0804) Pulse Rate:  [62-84] 84 (05/18 0804) Resp:  [13-17] 17 (05/18 0804) BP: (121-173)/(71-109) 173/109 (05/18 0804) SpO2:  [88 %-100 %] 93 % (05/18 0804) Weight:  [62.1 kg] 62.1 kg (05/17 1550)    Intake/Output from previous day: 05/17 0701 - 05/18 0700 In: 656 [I.V.:656] Out: 200 [Urine:200] Intake/Output this shift: Total I/O In: 120 [P.O.:120] Out: -   Gen:  A&O x 3, NAD. Looks chronically ill.  CV:  reg Resp:  coarse BS on right.  Decreased b bases.   Abd:  soft, non distended.    Lab Results:  Recent Labs    01/22/24 0847 01/22/24 0900  WBC 5.6 4.9  HGB 10.9* 9.9*  HCT 33.3* 30.2*  PLT 183 204   BMET Recent Labs    01/21/24 1552 01/22/24 0002 01/22/24 0847  NA 135  --  135  K 3.8  --  4.1  CL 104  --  101  CO2 22  --  23  GLUCOSE 90  --  104*  BUN 30*  --  20  CREATININE 2.99* 2.72* 2.50*  CALCIUM 8.5*  --  9.2   PT/INR Recent Labs    01/21/24 1630  LABPROT 14.6  INR 1.1   ABG No results for input(s): "PHART", "HCO3" in the last 72 hours.  Invalid input(s): "PCO2", "PO2"  Studies/Results: CT CHEST ABDOMEN PELVIS WO CONTRAST Result Date: 01/21/2024 CLINICAL DATA:  Blunt abdominal trauma EXAM: CT CHEST, ABDOMEN AND PELVIS WITHOUT CONTRAST TECHNIQUE: Multidetector CT imaging of the chest, abdomen and pelvis was performed following the standard protocol without IV contrast. Examination was performed without IV contrast at the request of the ordering clinician. Assessment of the vasculature, soft tissues, and solid viscera is limited without IV contrast, especially in the setting of trauma. RADIATION DOSE REDUCTION: This exam was performed according to the departmental dose-optimization  program which includes automated exposure control, adjustment of the mA and/or kV according to patient size and/or use of iterative reconstruction technique. COMPARISON:  01/22/2023, 11/14/2023, 01/21/2024 FINDINGS: CT CHEST FINDINGS Cardiovascular: Unenhanced imaging of the heart demonstrates mild cardiomegaly without pericardial effusion. Normal caliber of the thoracic aorta. Atherosclerosis of the aorta and coronary vasculature. Assessment of the vascular lumen cannot be performed without intravenous contrast. Mediastinum/Nodes: No enlarged mediastinal, hilar, or axillary lymph nodes. Thyroid gland, trachea, and esophagus demonstrate no significant findings. Lungs/Pleura: Bibasilar atelectasis, right greater than left. No acute airspace disease. Minimal right pleural fluid is identified adjacent multiple rib fractures, consistent with small hemothorax. No evidence of pneumothorax. Central airways are patent. Musculoskeletal: There are displaced segmental right third through ninth rib fractures. There are other prior healed bilateral rib fractures noted. Stable chronic compression deformities at T3 and T9. Prior healed sternal fracture. Reconstructed images demonstrate no additional findings. CT ABDOMEN PELVIS FINDINGS Hepatobiliary: Prior cholecystectomy. Pneumobilia likely related to prior sphincterotomy or biliary duct manipulation. Unremarkable unenhanced appearance of the liver. Pancreas: Unremarkable unenhanced appearance. Spleen: Unremarkable unenhanced appearance. Adrenals/Urinary Tract: No urinary tract calculi or obstructive uropathy. The adrenals and bladder are unremarkable. Stomach/Bowel: No bowel obstruction or ileus. Normal appendix right lower quadrant. No bowel wall thickening or inflammatory change. Vascular/Lymphatic: Aortic atherosclerosis. No enlarged abdominal or pelvic lymph nodes.  Reproductive: Prostate is unremarkable. Other: No free fluid or free intraperitoneal gas. No abdominal wall  hernia. Musculoskeletal: There are subacute healing fractures through the right superior and inferior pubic rami, right sacral ala, and left parasymphyseal region. Chronic compression deformity noted at the L1 level unchanged. No acute fractures. Severe spondylosis and facet hypertrophy at L4-5 again noted. Reconstructed images demonstrate no additional findings. IMPRESSION: 1. Acute displaced segmental right third through ninth rib fractures, with small associated hemothorax. No evidence of pneumothorax. 2. No acute intra-abdominal or intrapelvic trauma identified on this unenhanced exam. 3. Aortic Atherosclerosis (ICD10-I70.0). Coronary artery atherosclerosis. Electronically Signed   By: Bobbye Burrow M.D.   On: 01/21/2024 18:45   CT Cervical Spine Wo Contrast Result Date: 01/21/2024 CLINICAL DATA:  Neck trauma (Age >= 65y) EXAM: CT CERVICAL SPINE WITHOUT CONTRAST TECHNIQUE: Multidetector CT imaging of the cervical spine was performed without intravenous contrast. Multiplanar CT image reconstructions were also generated. RADIATION DOSE REDUCTION: This exam was performed according to the departmental dose-optimization program which includes automated exposure control, adjustment of the mA and/or kV according to patient size and/or use of iterative reconstruction technique. COMPARISON:  CT 02/07/2023 FINDINGS: Alignment: No traumatic subluxation. Mild broad-based rightward curvature. Trace retrolisthesis at C3-C4 is unchanged from prior exam. Skull base and vertebrae: No acute fracture. Advanced chronic degenerative change at C1-C2 with pannus. T3 superior endplate compression deformity was present on prior exam. Soft tissues and spinal canal: No prevertebral fluid or swelling. No visible canal hematoma.21 Disc levels: Diffuse degenerative disc disease with mild multilevel facet hypertrophy. Upper chest: Assessed on concurrent chest CT, reported separately. Other: Scattered air within the soft tissues about the  right neck may be related to right rib fractures. IMPRESSION: 1. No acute fracture or subluxation of the cervical spine. 2. Multilevel degenerative change throughout the cervical spine. Electronically Signed   By: Chadwick Colonel M.D.   On: 01/21/2024 18:44   CT Head Wo Contrast Result Date: 01/21/2024 CLINICAL DATA:  Head trauma, minor (Age >= 65y) Status post fall. EXAM: CT HEAD WITHOUT CONTRAST TECHNIQUE: Contiguous axial images were obtained from the base of the skull through the vertex without intravenous contrast. RADIATION DOSE REDUCTION: This exam was performed according to the departmental dose-optimization program which includes automated exposure control, adjustment of the mA and/or kV according to patient size and/or use of iterative reconstruction technique. COMPARISON:  Head CT 02/07/2023 FINDINGS: Brain: No intracranial hemorrhage, mass effect, or midline shift. Stable degree of atrophy and chronic small vessel ischemia. No hydrocephalus. The basilar cisterns are patent. Chronic bilateral basal gangliar mineralization. No evidence of territorial infarct or acute ischemia. No extra-axial or intracranial fluid collection. Vascular: Atherosclerosis of skullbase vasculature without hyperdense vessel or abnormal calcification. Gas in the cavernous sinus is typically related to IV catheters. Skull: No fracture or focal lesion. Sinuses/Orbits: No acute finding. No evidence of acute fracture. Chronic opacification of lower right mastoid air cells. Other: No confluent scalp hematoma. IMPRESSION: 1. No acute intracranial abnormality. No skull fracture. 2. Stable atrophy and chronic small vessel ischemia. Electronically Signed   By: Chadwick Colonel M.D.   On: 01/21/2024 18:38   DG Chest Portable 1 View Result Date: 01/21/2024 CLINICAL DATA:  Fall on right side. EXAM: PORTABLE CHEST 1 VIEW COMPARISON:  February 12, 2023. FINDINGS: Stable cardiomediastinal silhouette. Left lung is clear. Severely displaced  fractures are seen involving the lateral portions of the right third through eighth ribs. Minimal right apical pneumothorax is noted. Minimal right basilar subsegmental atelectasis  is noted. IMPRESSION: Minimal right apical pneumothorax is noted secondary to severely displaced fractures involving the lateral portions of the right third through eighth ribs. Electronically Signed   By: Rosalene Colon M.D.   On: 01/21/2024 15:58    Anti-infectives: Anti-infectives (From admission, onward)    None       Assessment/Plan: s/p * No surgery found * Post trauma day 1, GLF 01/21/24 Right rib fractures 3-9 Small right hemothorax  H/o OLT CKD H/o ongoing EtOH use, on CIWA protocol  PT/OT Multimodal pain control. Off morphine , on fentanyl prn for the iv component of pain control.  Did not use this much.  Getting oxy and robaxin .    Repeat CXR just obtained, small right effusion/hemothorax stable.  Repeat film based on clinical issues, would not repeat unless patient develops change    LOS: 1 day    Lockie Rima 01/22/2024

## 2024-01-22 NOTE — TOC CAGE-AID Note (Signed)
 Transition of Care [facility] - CAGE-AID Screening   Patient Details  Name: Lenis Nettleton MRN: [id number] Date of Birth: 03/09/1951  Transition of [facility]) CM/SW Contact:    Juan Noel, RN Phone Number: [phone] 01/22/2024, 7:46 PM   Clinical Narrative: Pt has significant hx of alcohol abuse and has had hepatic failure post liver transplants.  Pts BAC elevated on arrival post fall.  Pt has been counseled and educated on importance of not drinking alcohol.  Pt is being aggressively hydrated while in hospital and is on CIWA in preparation for withdrawals.    CAGE-AID Screening:    Have You Ever Felt You Ought to Cut Down on Your Drinking or Drug Use?: Yes Have People Annoyed You By Critizing Your Drinking Or Drug Use?: Yes Have You Felt Bad Or Guilty About Your Drinking Or Drug Use?: Yes Have You Ever Had a Drink or Used Drugs First Thing In The Morning to Steady Your Nerves or to Get Rid of a Hangover?: No CAGE-AID Score: 3  Substance Abuse Education Offered: Yes  Substance abuse interventions: Patient Counseling

## 2024-01-22 NOTE — Plan of Care (Signed)

## 2024-01-23 DIAGNOSIS — N1831 Chronic kidney disease, stage 3a: Secondary | ICD-10-CM | POA: Diagnosis not present

## 2024-01-23 DIAGNOSIS — S2241XA Multiple fractures of ribs, right side, initial encounter for closed fracture: Secondary | ICD-10-CM | POA: Diagnosis not present

## 2024-01-23 DIAGNOSIS — F1021 Alcohol dependence, in remission: Secondary | ICD-10-CM | POA: Diagnosis not present

## 2024-01-23 DIAGNOSIS — W19XXXA Unspecified fall, initial encounter: Secondary | ICD-10-CM | POA: Diagnosis not present

## 2024-01-23 LAB — CBC WITH DIFFERENTIAL/PLATELET
Abs Immature Granulocytes: 0.01 10*3/uL (ref 0.00–0.07)
Basophils Absolute: 0 10*3/uL (ref 0.0–0.1)
Basophils Relative: 0 %
Eosinophils Absolute: 0.1 10*3/uL (ref 0.0–0.5)
Eosinophils Relative: 2 %
HCT: 30.4 % — ABNORMAL LOW (ref 39.0–52.0)
Hemoglobin: 9.9 g/dL — ABNORMAL LOW (ref 13.0–17.0)
Immature Granulocytes: 0 %
Lymphocytes Relative: 19 %
Lymphs Abs: 1 10*3/uL (ref 0.7–4.0)
MCH: 31.1 pg (ref 26.0–34.0)
MCHC: 32.6 g/dL (ref 30.0–36.0)
MCV: 95.6 fL (ref 80.0–100.0)
Monocytes Absolute: 0.4 10*3/uL (ref 0.1–1.0)
Monocytes Relative: 8 %
Neutro Abs: 3.9 10*3/uL (ref 1.7–7.7)
Neutrophils Relative %: 71 %
Platelets: 153 10*3/uL (ref 150–400)
RBC: 3.18 MIL/uL — ABNORMAL LOW (ref 4.22–5.81)
RDW: 16.4 % — ABNORMAL HIGH (ref 11.5–15.5)
WBC: 5.5 10*3/uL (ref 4.0–10.5)
nRBC: 0 % (ref 0.0–0.2)

## 2024-01-23 LAB — MAGNESIUM: Magnesium: 2.3 mg/dL (ref 1.7–2.4)

## 2024-01-23 LAB — GLUCOSE, CAPILLARY
Glucose-Capillary: 98 mg/dL (ref 70–99)
Glucose-Capillary: 109 mg/dL — ABNORMAL HIGH (ref 70–99)
Glucose-Capillary: 125 mg/dL — ABNORMAL HIGH (ref 70–99)
Glucose-Capillary: 113 mg/dL — ABNORMAL HIGH (ref 70–99)

## 2024-01-23 LAB — COMPREHENSIVE METABOLIC PANEL WITH GFR
ALT: 16 U/L (ref 0–44)
AST: 31 U/L (ref 15–41)
Albumin: 2.7 g/dL — ABNORMAL LOW (ref 3.5–5.0)
Alkaline Phosphatase: 86 U/L (ref 38–126)
Anion gap: 7 (ref 5–15)
BUN: 17 mg/dL (ref 8–23)
CO2: 28 mmol/L (ref 22–32)
Calcium: 9.1 mg/dL (ref 8.9–10.3)
Chloride: 99 mmol/L (ref 98–111)
Creatinine, Ser: 2.32 mg/dL — ABNORMAL HIGH (ref 0.61–1.24)
GFR, Estimated: 29 mL/min — ABNORMAL LOW (ref >60)
Glucose, Bld: 113 mg/dL — ABNORMAL HIGH (ref 70–99)
Potassium: 5.2 mmol/L — ABNORMAL HIGH (ref 3.5–5.1)
Sodium: 134 mmol/L — ABNORMAL LOW (ref 135–145)
Total Bilirubin: 1 mg/dL (ref 0.0–1.2)
Total Protein: 5.9 g/dL — ABNORMAL LOW (ref 6.5–8.1)

## 2024-01-23 LAB — PHOSPHORUS: Phosphorus: 2.6 mg/dL (ref 2.5–4.6)

## 2024-01-23 MED ORDER — POLYETHYLENE GLYCOL 3350 17 G PO PACK
17.0000 g | PACK | Freq: Two times a day (BID) | ORAL | Status: DC
  Administered 2024-01-23 – 2024-01-24 (×2): 17 g via ORAL
  Filled 2024-01-23 (×3): qty 1

## 2024-01-23 MED ORDER — BISACODYL 10 MG RE SUPP: 10.0000 mg | Freq: Every day | RECTAL | Status: DC | PRN

## 2024-01-23 MED ORDER — SODIUM ZIRCONIUM CYCLOSILICATE 10 G PO PACK
10.0000 g | PACK | Freq: Once | ORAL | Status: AC
  Administered 2024-01-23: 10 g via ORAL
  Filled 2024-01-23: qty 1

## 2024-01-23 MED ORDER — SENNOSIDES-DOCUSATE SODIUM 8.6-50 MG PO TABS
1.0000 | ORAL_TABLET | Freq: Two times a day (BID) | ORAL | Status: DC
  Administered 2024-01-23 – 2024-01-24 (×2): 1 via ORAL
  Filled 2024-01-23 (×3): qty 1

## 2024-01-23 NOTE — Progress Notes (Signed)
 Subjective/Chief Complaint: Patient complains of pain with deep inspiration. Oxy helps some.  No n/v.  Has been OOB in chair but not walking yet.    Objective: Vital signs in last 24 hours: Temp:  [97.5 F (36.4 C)-98.2 F (36.8 C)] 98.1 F (36.7 C) (05/19 0805) Pulse Rate:  [70-76] 71 (05/19 0945) Resp:  [17-18] 18 (05/19 0805) BP: (145-157)/(77-85) 148/77 (05/19 0945) SpO2:  [93 %-96 %] 96 % (05/19 0805)    Intake/Output from previous day: 05/18 0701 - 05/19 0700 In: 1064.9 [P.O.:120; I.V.:944.9] Out: 250 [Urine:250] Intake/Output this shift: No intake/output data recorded.  Gen:  A&O x 3, NAD. Looks chronically ill.  CV:  reg Resp:  coarse BS on right.  Decreased b bases.  On 2L [place].  Only pulls 250 on IS Abd:  soft, non distended.    Lab Results:  Recent Labs    01/22/24 0900 01/23/24 0935  WBC 4.9 5.5  HGB 9.9* 9.9*  HCT 30.2* 30.4*  PLT 204 153   BMET Recent Labs    01/21/24 1552 01/22/24 0002 01/22/24 0847  NA 135  --  135  K 3.8  --  4.1  CL 104  --  101  CO2 22  --  23  GLUCOSE 90  --  104*  BUN 30*  --  20  CREATININE 2.99* 2.72* 2.50*  CALCIUM 8.5*  --  9.2   PT/INR Recent Labs    01/21/24 1630  LABPROT 14.6  INR 1.1   ABG No results for input(s): "PHART", "HCO3" in the last 72 hours.  Invalid input(s): "PCO2", "PO2"  Studies/Results: DG CHEST PORT 1 VIEW Result Date: 01/22/2024 CLINICAL DATA:  Right hemothorax. EXAM: PORTABLE CHEST 1 VIEW COMPARISON:  Jan 21, 2024. FINDINGS: Stable cardiomediastinal silhouette. Stable right rib fractures. No definite pneumothorax is noted currently. Minimal right basilar subsegmental atelectasis is noted with small pleural effusion or hemothorax. Minimal left basilar subsegmental atelectasis is noted. IMPRESSION: Stable right rib fractures are noted with minimal right basilar subsegmental atelectasis with small right pleural effusion or hemothorax. Electronically Signed   By: Rosalene Colon M.D.    On: 01/22/2024 11:56   CT CHEST ABDOMEN PELVIS WO CONTRAST Result Date: 01/21/2024 CLINICAL DATA:  Blunt abdominal trauma EXAM: CT CHEST, ABDOMEN AND PELVIS WITHOUT CONTRAST TECHNIQUE: Multidetector CT imaging of the chest, abdomen and pelvis was performed following the standard protocol without IV contrast. Examination was performed without IV contrast at the request of the ordering clinician. Assessment of the vasculature, soft tissues, and solid viscera is limited without IV contrast, especially in the setting of trauma. RADIATION DOSE REDUCTION: This exam was performed according to the departmental dose-optimization program which includes automated exposure control, adjustment of the mA and/or kV according to patient size and/or use of iterative reconstruction technique. COMPARISON:  01/22/2023, 11/14/2023, 01/21/2024 FINDINGS: CT CHEST FINDINGS Cardiovascular: Unenhanced imaging of the heart demonstrates mild cardiomegaly without pericardial effusion. Normal caliber of the thoracic aorta. Atherosclerosis of the aorta and coronary vasculature. Assessment of the vascular lumen cannot be performed without intravenous contrast. Mediastinum/Nodes: No enlarged mediastinal, hilar, or axillary lymph nodes. Thyroid gland, trachea, and esophagus demonstrate no significant findings. Lungs/Pleura: Bibasilar atelectasis, right greater than left. No acute airspace disease. Minimal right pleural fluid is identified adjacent multiple rib fractures, consistent with small hemothorax. No evidence of pneumothorax. Central airways are patent. Musculoskeletal: There are displaced segmental right third through ninth rib fractures. There are other prior healed bilateral rib fractures noted. Stable  chronic compression deformities at T3 and T9. Prior healed sternal fracture. Reconstructed images demonstrate no additional findings. CT ABDOMEN PELVIS FINDINGS Hepatobiliary: Prior cholecystectomy. Pneumobilia likely related to prior  sphincterotomy or biliary duct manipulation. Unremarkable unenhanced appearance of the liver. Pancreas: Unremarkable unenhanced appearance. Spleen: Unremarkable unenhanced appearance. Adrenals/Urinary Tract: No urinary tract calculi or obstructive uropathy. The adrenals and bladder are unremarkable. Stomach/Bowel: No bowel obstruction or ileus. Normal appendix right lower quadrant. No bowel wall thickening or inflammatory change. Vascular/Lymphatic: Aortic atherosclerosis. No enlarged abdominal or pelvic lymph nodes. Reproductive: Prostate is unremarkable. Other: No free fluid or free intraperitoneal gas. No abdominal wall hernia. Musculoskeletal: There are subacute healing fractures through the right superior and inferior pubic rami, right sacral ala, and left parasymphyseal region. Chronic compression deformity noted at the L1 level unchanged. No acute fractures. Severe spondylosis and facet hypertrophy at L4-5 again noted. Reconstructed images demonstrate no additional findings. IMPRESSION: 1. Acute displaced segmental right third through ninth rib fractures, with small associated hemothorax. No evidence of pneumothorax. 2. No acute intra-abdominal or intrapelvic trauma identified on this unenhanced exam. 3. Aortic Atherosclerosis (ICD10-I70.0). Coronary artery atherosclerosis. Electronically Signed   By: Bobbye Burrow M.D.   On: 01/21/2024 18:45   CT Cervical Spine Wo Contrast Result Date: 01/21/2024 CLINICAL DATA:  Neck trauma (Age >= 65y) EXAM: CT CERVICAL SPINE WITHOUT CONTRAST TECHNIQUE: Multidetector CT imaging of the cervical spine was performed without intravenous contrast. Multiplanar CT image reconstructions were also generated. RADIATION DOSE REDUCTION: This exam was performed according to the departmental dose-optimization program which includes automated exposure control, adjustment of the mA and/or kV according to patient size and/or use of iterative reconstruction technique. COMPARISON:  CT  02/07/2023 FINDINGS: Alignment: No traumatic subluxation. Mild broad-based rightward curvature. Trace retrolisthesis at C3-C4 is unchanged from prior exam. Skull base and vertebrae: No acute fracture. Advanced chronic degenerative change at C1-C2 with pannus. T3 superior endplate compression deformity was present on prior exam. Soft tissues and spinal canal: No prevertebral fluid or swelling. No visible canal hematoma.21 Disc levels: Diffuse degenerative disc disease with mild multilevel facet hypertrophy. Upper chest: Assessed on concurrent chest CT, reported separately. Other: Scattered air within the soft tissues about the right neck may be related to right rib fractures. IMPRESSION: 1. No acute fracture or subluxation of the cervical spine. 2. Multilevel degenerative change throughout the cervical spine. Electronically Signed   By: Chadwick Colonel M.D.   On: 01/21/2024 18:44   CT Head Wo Contrast Result Date: 01/21/2024 CLINICAL DATA:  Head trauma, minor (Age >= 65y) Status post fall. EXAM: CT HEAD WITHOUT CONTRAST TECHNIQUE: Contiguous axial images were obtained from the base of the skull through the vertex without intravenous contrast. RADIATION DOSE REDUCTION: This exam was performed according to the departmental dose-optimization program which includes automated exposure control, adjustment of the mA and/or kV according to patient size and/or use of iterative reconstruction technique. COMPARISON:  Head CT 02/07/2023 FINDINGS: Brain: No intracranial hemorrhage, mass effect, or midline shift. Stable degree of atrophy and chronic small vessel ischemia. No hydrocephalus. The basilar cisterns are patent. Chronic bilateral basal gangliar mineralization. No evidence of territorial infarct or acute ischemia. No extra-axial or intracranial fluid collection. Vascular: Atherosclerosis of skullbase vasculature without hyperdense vessel or abnormal calcification. Gas in the cavernous sinus is typically related to IV  catheters. Skull: No fracture or focal lesion. Sinuses/Orbits: No acute finding. No evidence of acute fracture. Chronic opacification of lower right mastoid air cells. Other: No confluent scalp hematoma. IMPRESSION: 1.  No acute intracranial abnormality. No skull fracture. 2. Stable atrophy and chronic small vessel ischemia. Electronically Signed   By: Chadwick Colonel M.D.   On: 01/21/2024 18:38   DG Chest Portable 1 View Result Date: 01/21/2024 CLINICAL DATA:  Fall on right side. EXAM: PORTABLE CHEST 1 VIEW COMPARISON:  February 12, 2023. FINDINGS: Stable cardiomediastinal silhouette. Left lung is clear. Severely displaced fractures are seen involving the lateral portions of the right third through eighth ribs. Minimal right apical pneumothorax is noted. Minimal right basilar subsegmental atelectasis is noted. IMPRESSION: Minimal right apical pneumothorax is noted secondary to severely displaced fractures involving the lateral portions of the right third through eighth ribs. Electronically Signed   By: Rosalene Colon M.D.   On: 01/21/2024 15:58    Anti-infectives: Anti-infectives (From admission, onward)    None       Assessment/Plan: s/p * No surgery found * Post trauma day 2, GLF 01/21/24 Right rib fractures 3-9 Small right hemothorax  H/o OLT CKD H/o ongoing EtOH use, on CIWA protocol  PT/OT Multimodal pain control. Getting oxy and robaxin .    Repeat CXR just obtained yesterday, small right effusion/hemothorax stable.  Repeat film based on clinical issues, would not repeat unless patient develops change  Patient is stable from a trauma standpoint.  We will be available as needed at this time.    LOS: 2 days    Leone Ralphs 01/23/2024

## 2024-01-23 NOTE — Plan of Care (Signed)

## 2024-01-23 NOTE — Progress Notes (Addendum)
 Patient's 2L nasal cannula was briefly removed for assessment. SpO2 decreased to 89-91%. Nasal cannula reapplied to 2L; SpO2 improved to 96%

## 2024-01-23 NOTE — Progress Notes (Signed)
   01/23/24 1016  TOC Brief Assessment  Insurance and Status Reviewed  Patient has primary care physician  ([place] not in [place])  Home environment has been reviewed girlfriend  Prior level of function: uses cane or walker when needed  Prior/Current Home Services No current home services  Social Drivers of Health Review SDOH reviewed no interventions necessary  Readmission risk has been reviewed Yes  Transition of care needs  (see note)   Spoke to patient at bedside.   Patient has moved to [facility] , confined address on face sheet.   Patient does not have a [place] PCP .   PT recommending home health PT at discharge.   TOC will schedule an appointment with a PCP  , but home health cannot be arranged until [place] PCP sees him. He is aware . Secure chatted attending.  Patient currently on oxygen in the hospital , does not have oxygen at home.   Insurance is through [employer] [facility] [place] [facility]

## 2024-01-23 NOTE — Progress Notes (Signed)
 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  He reports that he drank about a pint of liquor and was found to be hypotensive with SBP in the 90s.  He received IV fluids in the ED and complained of right-sided chest wall pain and further workup was done and he was found to have an acute right 3 through 9 rib fractures.  Trauma surgery was consulted and recommending multimodal management.  Continues to complain of pain with deep inspiration with improvement with oxy and has been out of bed but not walking so will need to continue ambulation.  Trauma surgery is now signing off as of 01/23/2024.  Assessment and Plan:  Fall in the setting of Alcohol Intoxication: Patient was hypotensive when EMS arrived.  Most likely as result of alcoholism.  Patient is being aggressively hydrated.  Will admit.  Pain control.  Counseled on alcohol intake.  PT OT consulted and recommending HH   Acute Ribs 3-9 Fx with small Right Hemothorax in the setting of Fall Patient will be admitted for pain management.  Initiate incentive spirometer with pain control.  PT/OT recommend HH.  On IV fentanyl  now and also getting oxycodone  and Robaxin .  Repeat chest x-ray done 5/18 and shows mild right effusion hemothorax is stable.  Per general surgery repeat film based on clinical issues and will not repeat unless she develops change but will get one for the AM in anticipate of possible D/C.  Does not do very much on his incentive spirometer and only can get around 250 mL.  Anticipate discharging in the next 24 to 48 hours   Alcohol Abuse: Initiate  CIWA protocol w/ Lorazepam  and CTM for Withdrawls.  Counseling provided  Hypoxemia: In the setting of Rib Fx's.  He remains on 2 L supplemental oxygen via nasal cannula and this was removed for assessment and his O2 sats decreased to 89 to 91%.  This is reapplied and his O2 saturations are improved.  Will need ambulatory home O2 screen prior to discharge and will repeat his chest x-ray in the a.m.   History of Liver Transplant x2: Resume Home Tacrolimus . CTM    AKI on CKD Stage 3b: BUN/Cr Trend went from 30/2.99 -> 20/2.50 -> 17/2.32. IV fluid hydration with D5 and LR at 100 mL/h x 1 day now stopped. Avoid Nephrotoxic Medications, Contrast Dyes, Hypotension and Dehydration to Ensure Adequate Renal Perfusion and will need to Renally Adjust Meds. CTM and Trend Renal Function carefully and repeat CMP in the AM   Constipation: Bowel regimen initiated w/ Senna-Docusate 1 tab po BID, Miralax  17 grams BID, and Bisacodyl  Suppository 10 mg RC   Hyperbilirubinemia: Mild and improved. Likely was reactive. T Bili went from 1.4 -> 1.1. CTM and Trend and repeat CMP in the AM  Hyperkalemia: Mild. K+ is 5.2. Will give 10 g of Lokelma  x1. CTM and Trend and repeat CMP in the AM.   HypoNa+: Mild. Na+ is now 134. CTM and Trend and repeat CMP in the AM   Essential HTN: Will resume home regimen continue Amlodipine  5 mg p.o. daily and Metoprolol   Tartrate 50 g p.o. twice daily. CTM BP per Protocol. Last BP reading was 151/81  Normocytic Anemia/Anemia of Chronic Disease: Hgb/Hct Trend fairly stable:  Recent Labs  Lab 01/21/24 1552 01/22/24 0002 01/22/24 0847 01/22/24 0900 01/23/24 0935  HGB 9.1* 9.8* 10.9* 9.9* 9.9*  HCT 28.2* 29.3* 33.3* 30.2* 30.4*  MCV 96.6 95.1 94.9 94.4 95.6  -Check Anemia Panel in the AM. CTM for S/Sx of Bleeding; No overt bleeding noted. Repeat CBC in the AM   Hypoalbuminemia: Patient's Albumin Level went from 2.7 -> 3.1 -> 2.7. CTM and Trend Repeat CMP in the AM   DVT prophylaxis:  enoxaparin  (LOVENOX ) injection 30 mg Start: 01/21/24 2215    Code Status: Full Code Family Communication: No family present @ bedside  Disposition Plan:  Level of care: Med-Surg Status is: Inpatient Remains inpatient appropriate because: Will need Ambulatory Home O2 Screen prior to D/C and pain to adequately controlled w/ po analgesics   Consultants:  Trauma Surgery  Procedures:  As delineated as above  Antimicrobials:  Anti-infectives (From admission, onward)    None       Subjective: Seen and examined at bedside and was still complaining of some pain with deep inspiration.  Continues to wear supplemental oxygen via nasal cannula at this time.  Is not able to do very much on his incentive spirometer but states that when he does do the incentive spirometer that it hurts.  No other concerns or complaints at this time.  Objective: Vitals:   01/23/24 0945 01/23/24 1100 01/23/24 1102 01/23/24 1440  BP: (!) 148/77   (!) 151/81  Pulse: 71   73  Resp:    18  Temp:    98.4 F (36.9 C)  TempSrc:    Oral  SpO2:  (!) 89% 96% 95%  Weight:      Height:        Intake/Output Summary (Last 24 hours) at 01/23/2024 1546 Last data filed at 01/23/2024 1300 Gross per 24 hour  Intake --  Output 325 ml  Net -325 ml   Filed Weights   01/21/24 1550  Weight: 62.1 kg   Examination: Physical Exam:  Constitutional: Thin Caucasian elderly male who appears older than his stated age and appears a little uncomfortable Respiratory: Diminished to auscultation bilaterally with some coarse breath sounds and some rhonchi and some slight crackles.  Normal respiratory effort and is not tachypneic but he is on supplemental oxygen via nasal cannula at 2 L. Cardiovascular: RRR, no murmurs / rubs / gallops. S1 and S2 auscultated. No extremity edema.  Abdomen: Soft, non-tender, non-distended. Bowel sounds positive.  GU: Deferred. Musculoskeletal: No clubbing / cyanosis of digits/nails. No joint deformity  upper and lower extremities. Skin: No rashes, lesions, ulcers on limited skin evaluation. No induration; Warm and dry.  Neurologic: CN 2-12 grossly intact with no focal deficits. Romberg sign and cerebellar reflexes not assessed.  Psychiatric: Normal judgment and insight.  He is awake and alert  Data Reviewed: I have personally reviewed following labs and imaging studies  CBC: Recent Labs  Lab 01/21/24 1552 01/22/24 0002 01/22/24 0847 01/22/24 0900 01/23/24 0935  WBC 6.8 4.7 5.6 4.9 5.5  NEUTROABS 4.8  --   --  3.5 3.9  HGB 9.1* 9.8* 10.9* 9.9* 9.9*  HCT 28.2* 29.3* 33.3* 30.2* 30.4*  MCV 96.6 95.1 94.9 94.4 95.6  PLT 203 201 183 204 153   Basic Metabolic Panel: Recent Labs  Lab 01/21/24 1552 01/22/24 0002 01/22/24 0847 01/22/24 0900  01/23/24 0935  NA 135  --  135  --  134*  K 3.8  --  4.1  --  5.2*  CL 104  --  101  --  99  CO2 22  --  23  --  28  GLUCOSE 90  --  104*  --  113*  BUN 30*  --  20  --  17  CREATININE 2.99* 2.72* 2.50*  --  2.32*  CALCIUM 8.5*  --  9.2  --  9.1  MG  --   --   --  1.5* 2.3  PHOS  --   --   --  2.9 2.6   GFR: Estimated Creatinine Clearance: 26 mL/min (A) (by C-G formula based on SCr of 2.32 mg/dL (H)). Liver Function Tests: Recent Labs  Lab 01/21/24 1552 01/22/24 0847 01/23/24 0935  AST 35 39 31  ALT 14 19 16   ALKPHOS 71 85 86  BILITOT 0.8 1.4* 1.0  PROT 5.6* 6.5 5.9*  ALBUMIN 2.7* 3.1* 2.7*   No results for input(s): "LIPASE", "AMYLASE" in the last 168 hours. Recent Labs  Lab 01/21/24 1630  AMMONIA 15   Coagulation Profile: Recent Labs  Lab 01/21/24 1630  INR 1.1   Cardiac Enzymes: No results for input(s): "CKTOTAL", "CKMB", "CKMBINDEX", "TROPONINI" in the last 168 hours. BNP (last 3 results) No results for input(s): "PROBNP" in the last 8760 hours. HbA1C: Recent Labs    01/22/24 0002  HGBA1C 4.3*   CBG: Recent Labs  Lab 01/22/24 1132 01/22/24 1636 01/22/24 2016 01/23/24 0805 01/23/24 1141  GLUCAP 142*  74 128* 98 109*   Lipid Profile: No results for input(s): "CHOL", "HDL", "LDLCALC", "TRIG", "CHOLHDL", "LDLDIRECT" in the last 72 hours. Thyroid Function Tests: No results for input(s): "TSH", "T4TOTAL", "FREET4", "T3FREE", "THYROIDAB" in the last 72 hours. Anemia Panel: No results for input(s): "VITAMINB12", "FOLATE", "FERRITIN", "TIBC", "IRON", "RETICCTPCT" in the last 72 hours. Sepsis Labs: No results for input(s): "PROCALCITON", "LATICACIDVEN" in the last 168 hours.  No results found for this or any previous visit (from the past 240 hours).   Radiology Studies: DG CHEST PORT 1 VIEW Result Date: 01/22/2024 CLINICAL DATA:  Right hemothorax. EXAM: PORTABLE CHEST 1 VIEW COMPARISON:  Jan 21, 2024. FINDINGS: Stable cardiomediastinal silhouette. Stable right rib fractures. No definite pneumothorax is noted currently. Minimal right basilar subsegmental atelectasis is noted with small pleural effusion or hemothorax. Minimal left basilar subsegmental atelectasis is noted. IMPRESSION: Stable right rib fractures are noted with minimal right basilar subsegmental atelectasis with small right pleural effusion or hemothorax. Electronically Signed   By: Rosalene Colon M.D.   On: 01/22/2024 11:56   CT CHEST ABDOMEN PELVIS WO CONTRAST Result Date: 01/21/2024 CLINICAL DATA:  Blunt abdominal trauma EXAM: CT CHEST, ABDOMEN AND PELVIS WITHOUT CONTRAST TECHNIQUE: Multidetector CT imaging of the chest, abdomen and pelvis was performed following the standard protocol without IV contrast. Examination was performed without IV contrast at the request of the ordering clinician. Assessment of the vasculature, soft tissues, and solid viscera is limited without IV contrast, especially in the setting of trauma. RADIATION DOSE REDUCTION: This exam was performed according to the departmental dose-optimization program which includes automated exposure control, adjustment of the mA and/or kV according to patient size and/or use of  iterative reconstruction technique. COMPARISON:  01/22/2023, 11/14/2023, 01/21/2024 FINDINGS: CT CHEST FINDINGS Cardiovascular: Unenhanced imaging of the heart demonstrates mild cardiomegaly without pericardial effusion. Normal caliber of the thoracic aorta. Atherosclerosis of the aorta and coronary  vasculature. Assessment of the vascular lumen cannot be performed without intravenous contrast. Mediastinum/Nodes: No enlarged mediastinal, hilar, or axillary lymph nodes. Thyroid gland, trachea, and esophagus demonstrate no significant findings. Lungs/Pleura: Bibasilar atelectasis, right greater than left. No acute airspace disease. Minimal right pleural fluid is identified adjacent multiple rib fractures, consistent with small hemothorax. No evidence of pneumothorax. Central airways are patent. Musculoskeletal: There are displaced segmental right third through ninth rib fractures. There are other prior healed bilateral rib fractures noted. Stable chronic compression deformities at T3 and T9. Prior healed sternal fracture. Reconstructed images demonstrate no additional findings. CT ABDOMEN PELVIS FINDINGS Hepatobiliary: Prior cholecystectomy. Pneumobilia likely related to prior sphincterotomy or biliary duct manipulation. Unremarkable unenhanced appearance of the liver. Pancreas: Unremarkable unenhanced appearance. Spleen: Unremarkable unenhanced appearance. Adrenals/Urinary Tract: No urinary tract calculi or obstructive uropathy. The adrenals and bladder are unremarkable. Stomach/Bowel: No bowel obstruction or ileus. Normal appendix right lower quadrant. No bowel wall thickening or inflammatory change. Vascular/Lymphatic: Aortic atherosclerosis. No enlarged abdominal or pelvic lymph nodes. Reproductive: Prostate is unremarkable. Other: No free fluid or free intraperitoneal gas. No abdominal wall hernia. Musculoskeletal: There are subacute healing fractures through the right superior and inferior pubic rami, right  sacral ala, and left parasymphyseal region. Chronic compression deformity noted at the L1 level unchanged. No acute fractures. Severe spondylosis and facet hypertrophy at L4-5 again noted. Reconstructed images demonstrate no additional findings. IMPRESSION: 1. Acute displaced segmental right third through ninth rib fractures, with small associated hemothorax. No evidence of pneumothorax. 2. No acute intra-abdominal or intrapelvic trauma identified on this unenhanced exam. 3. Aortic Atherosclerosis (ICD10-I70.0). Coronary artery atherosclerosis. Electronically Signed   By: Bobbye Burrow M.D.   On: 01/21/2024 18:45   CT Cervical Spine Wo Contrast Result Date: 01/21/2024 CLINICAL DATA:  Neck trauma (Age >= 65y) EXAM: CT CERVICAL SPINE WITHOUT CONTRAST TECHNIQUE: Multidetector CT imaging of the cervical spine was performed without intravenous contrast. Multiplanar CT image reconstructions were also generated. RADIATION DOSE REDUCTION: This exam was performed according to the departmental dose-optimization program which includes automated exposure control, adjustment of the mA and/or kV according to patient size and/or use of iterative reconstruction technique. COMPARISON:  CT 02/07/2023 FINDINGS: Alignment: No traumatic subluxation. Mild broad-based rightward curvature. Trace retrolisthesis at C3-C4 is unchanged from prior exam. Skull base and vertebrae: No acute fracture. Advanced chronic degenerative change at C1-C2 with pannus. T3 superior endplate compression deformity was present on prior exam. Soft tissues and spinal canal: No prevertebral fluid or swelling. No visible canal hematoma.21 Disc levels: Diffuse degenerative disc disease with mild multilevel facet hypertrophy. Upper chest: Assessed on concurrent chest CT, reported separately. Other: Scattered air within the soft tissues about the right neck may be related to right rib fractures. IMPRESSION: 1. No acute fracture or subluxation of the cervical spine.  2. Multilevel degenerative change throughout the cervical spine. Electronically Signed   By: Chadwick Colonel M.D.   On: 01/21/2024 18:44   CT Head Wo Contrast Result Date: 01/21/2024 CLINICAL DATA:  Head trauma, minor (Age >= 65y) Status post fall. EXAM: CT HEAD WITHOUT CONTRAST TECHNIQUE: Contiguous axial images were obtained from the base of the skull through the vertex without intravenous contrast. RADIATION DOSE REDUCTION: This exam was performed according to the departmental dose-optimization program which includes automated exposure control, adjustment of the mA and/or kV according to patient size and/or use of iterative reconstruction technique. COMPARISON:  Head CT 02/07/2023 FINDINGS: Brain: No intracranial hemorrhage, mass effect, or midline shift. Stable degree of atrophy  and chronic small vessel ischemia. No hydrocephalus. The basilar cisterns are patent. Chronic bilateral basal gangliar mineralization. No evidence of territorial infarct or acute ischemia. No extra-axial or intracranial fluid collection. Vascular: Atherosclerosis of skullbase vasculature without hyperdense vessel or abnormal calcification. Gas in the cavernous sinus is typically related to IV catheters. Skull: No fracture or focal lesion. Sinuses/Orbits: No acute finding. No evidence of acute fracture. Chronic opacification of lower right mastoid air cells. Other: No confluent scalp hematoma. IMPRESSION: 1. No acute intracranial abnormality. No skull fracture. 2. Stable atrophy and chronic small vessel ischemia. Electronically Signed   By: Chadwick Colonel M.D.   On: 01/21/2024 18:38   DG Chest Portable 1 View Result Date: 01/21/2024 CLINICAL DATA:  Fall on right side. EXAM: PORTABLE CHEST 1 VIEW COMPARISON:  February 12, 2023. FINDINGS: Stable cardiomediastinal silhouette. Left lung is clear. Severely displaced fractures are seen involving the lateral portions of the right third through eighth ribs. Minimal right apical pneumothorax  is noted. Minimal right basilar subsegmental atelectasis is noted. IMPRESSION: Minimal right apical pneumothorax is noted secondary to severely displaced fractures involving the lateral portions of the right third through eighth ribs. Electronically Signed   By: Rosalene Colon M.D.   On: 01/21/2024 15:58   Scheduled Meds:  amLODipine   5 mg Oral Daily   enoxaparin  (LOVENOX ) injection  30 mg Subcutaneous Q24H   famotidine   10 mg Oral QHS   folic acid   1 mg Oral Daily   insulin  aspart  0-15 Units Subcutaneous TID WC   insulin  aspart  0-5 Units Subcutaneous QHS   methocarbamol   500 mg Oral TID   metoprolol  tartrate  50 mg Oral BID   multivitamin with minerals  1 tablet Oral Daily   polyethylene glycol  17 g Oral BID   senna-docusate  1 tablet Oral BID   sodium zirconium cyclosilicate   10 g Oral Once   tacrolimus   1 mg Oral BID   tamsulosin   0.4 mg Oral Daily   thiamine   100 mg Oral Daily   Continuous Infusions:   LOS: 2 days   Aura Leeds, DO [facility] Available via Epic secure chat 7am-7pm After these hours, please refer to coverage provider listed on [email or web address] 01/23/2024, 3:46 PM

## 2024-01-23 NOTE — Plan of Care (Signed)

## 2024-01-24 ENCOUNTER — Inpatient Hospital Stay (HOSPITAL_COMMUNITY): Payer: Medicare (Managed Care)

## 2024-01-24 DIAGNOSIS — W19XXXA Unspecified fall, initial encounter: Secondary | ICD-10-CM | POA: Diagnosis not present

## 2024-01-24 DIAGNOSIS — I10 Essential (primary) hypertension: Secondary | ICD-10-CM | POA: Diagnosis not present

## 2024-01-24 DIAGNOSIS — Z944 Liver transplant status: Secondary | ICD-10-CM | POA: Diagnosis not present

## 2024-01-24 DIAGNOSIS — N1832 Chronic kidney disease, stage 3b: Secondary | ICD-10-CM

## 2024-01-24 DIAGNOSIS — F1021 Alcohol dependence, in remission: Secondary | ICD-10-CM | POA: Diagnosis not present

## 2024-01-24 LAB — COMPREHENSIVE METABOLIC PANEL WITH GFR
ALT: 14 U/L (ref 0–44)
AST: 22 U/L (ref 15–41)
Albumin: 2.8 g/dL — ABNORMAL LOW (ref 3.5–5.0)
Alkaline Phosphatase: 91 U/L (ref 38–126)
Anion gap: 6 (ref 5–15)
BUN: 17 mg/dL (ref 8–23)
CO2: 31 mmol/L (ref 22–32)
Calcium: 9 mg/dL (ref 8.9–10.3)
Chloride: 95 mmol/L — ABNORMAL LOW (ref 98–111)
Creatinine, Ser: 2.31 mg/dL — ABNORMAL HIGH (ref 0.61–1.24)
GFR, Estimated: 30 mL/min — ABNORMAL LOW (ref >60)
Glucose, Bld: 95 mg/dL (ref 70–99)
Potassium: 4 mmol/L (ref 3.5–5.1)
Sodium: 132 mmol/L — ABNORMAL LOW (ref 135–145)
Total Bilirubin: 1 mg/dL (ref 0.0–1.2)
Total Protein: 6.4 g/dL — ABNORMAL LOW (ref 6.5–8.1)

## 2024-01-24 LAB — IRON AND TIBC
Iron: 18 ug/dL — ABNORMAL LOW (ref 45–182)
Saturation Ratios: 11 % — ABNORMAL LOW (ref 17.9–39.5)
TIBC: 161 ug/dL — ABNORMAL LOW (ref 250–450)
UIBC: 143 ug/dL

## 2024-01-24 LAB — CBC WITH DIFFERENTIAL/PLATELET
Abs Immature Granulocytes: 0.03 10*3/uL (ref 0.00–0.07)
Basophils Absolute: 0 10*3/uL (ref 0.0–0.1)
Basophils Relative: 0 %
Eosinophils Absolute: 0.1 10*3/uL (ref 0.0–0.5)
Eosinophils Relative: 3 %
HCT: 31.2 % — ABNORMAL LOW (ref 39.0–52.0)
Hemoglobin: 10.1 g/dL — ABNORMAL LOW (ref 13.0–17.0)
Immature Granulocytes: 1 %
Lymphocytes Relative: 20 %
Lymphs Abs: 0.8 10*3/uL (ref 0.7–4.0)
MCH: 30.8 pg (ref 26.0–34.0)
MCHC: 32.4 g/dL (ref 30.0–36.0)
MCV: 95.1 fL (ref 80.0–100.0)
Monocytes Absolute: 0.2 10*3/uL (ref 0.1–1.0)
Monocytes Relative: 5 %
Neutro Abs: 2.9 10*3/uL (ref 1.7–7.7)
Neutrophils Relative %: 71 %
Platelets: 168 10*3/uL (ref 150–400)
RBC: 3.28 MIL/uL — ABNORMAL LOW (ref 4.22–5.81)
RDW: 16 % — ABNORMAL HIGH (ref 11.5–15.5)
WBC: 4.1 10*3/uL (ref 4.0–10.5)
nRBC: 0 % (ref 0.0–0.2)

## 2024-01-24 LAB — RETICULOCYTES
Immature Retic Fract: 13.7 % (ref 2.3–15.9)
RBC.: 3.24 MIL/uL — ABNORMAL LOW (ref 4.22–5.81)
Retic Count, Absolute: 46.3 10*3/uL (ref 19.0–186.0)
Retic Ct Pct: 1.4 % (ref 0.4–3.1)

## 2024-01-24 LAB — GLUCOSE, CAPILLARY
Glucose-Capillary: 97 mg/dL (ref 70–99)
Glucose-Capillary: 110 mg/dL — ABNORMAL HIGH (ref 70–99)

## 2024-01-24 LAB — VITAMIN B12: Vitamin B-12: 533 pg/mL (ref 180–914)

## 2024-01-24 LAB — PHOSPHORUS: Phosphorus: 3.3 mg/dL (ref 2.5–4.6)

## 2024-01-24 LAB — FERRITIN: Ferritin: 1037 ng/mL — ABNORMAL HIGH (ref 24–336)

## 2024-01-24 LAB — MAGNESIUM: Magnesium: 1.9 mg/dL (ref 1.7–2.4)

## 2024-01-24 LAB — FOLATE: Folate: 27.4 ng/mL (ref >5.9)

## 2024-01-24 MED ORDER — POLYETHYLENE GLYCOL 3350 17 G PO PACK: 17.0000 g | PACK | Freq: Every day | ORAL | 0 refills | Status: AC

## 2024-01-24 MED ORDER — FOLIC ACID 1 MG PO TABS: 1.0000 mg | ORAL_TABLET | Freq: Every day | ORAL | 0 refills | Status: AC

## 2024-01-24 MED ORDER — VITAMIN B-1 100 MG PO TABS: 100.0000 mg | ORAL_TABLET | Freq: Every day | ORAL | 0 refills | Status: AC

## 2024-01-24 MED ORDER — ONDANSETRON HCL 4 MG PO TABS: 4.0000 mg | ORAL_TABLET | Freq: Four times a day (QID) | ORAL | 0 refills | Status: AC | PRN

## 2024-01-24 MED ORDER — SENNOSIDES-DOCUSATE SODIUM 8.6-50 MG PO TABS
1.0000 | ORAL_TABLET | Freq: Every day | ORAL | 0 refills | Status: AC
Start: 2024-01-24 — End: ?

## 2024-01-24 MED ORDER — OXYCODONE HCL 5 MG PO TABS: 5.0000 mg | ORAL_TABLET | Freq: Four times a day (QID) | ORAL | 0 refills | Status: AC | PRN

## 2024-01-24 MED ORDER — METHOCARBAMOL 500 MG PO TABS: 500.0000 mg | ORAL_TABLET | Freq: Three times a day (TID) | ORAL | 0 refills | Status: AC | PRN

## 2024-01-24 MED ORDER — GUAIFENESIN ER 600 MG PO TB12: 600.0000 mg | ORAL_TABLET | Freq: Two times a day (BID) | ORAL | 0 refills | Status: AC | PRN

## 2024-01-24 NOTE — Plan of Care (Signed)

## 2024-01-24 NOTE — Progress Notes (Addendum)
 Occupational Therapy Treatment Patient Details Name: [name] MRN: [id number] DOB: 04-07-1951 Today's Date: 01/24/2024   History of present illness Pt is a 73 y.o. male admitted 5/17 for fall in home while intoxicated. Imaging showed R ribs 3-9 fxs and Small hemothorax. PMH: alcohol use, cirrhosis s/p liver transplant x2, HTN   OT comments  Pt supine in bed and agreeable to OT session.  He requires supervision for bed mobility, contact guard fading to close supervision for transfers and mobility using RW in room.  Intermittent cueing for safety and RW mgmt.  He is able to complete ADLs with supervision today.  Discussed fall prevention techniques and home safety.  VSS on RA during session. Continue to recommend HHOT at dc, will follow acutely.       If plan is discharge home, recommend the following:  A little help with walking and/or transfers;A little help with bathing/dressing/bathroom;Help with stairs or ramp for entrance   Equipment Recommendations  BSC/3in1    Recommendations for Other Services      Precautions / Restrictions Precautions Precautions: Fall Recall of Precautions/Restrictions: Intact Precaution/Restrictions Comments: R rib fxs Restrictions Weight Bearing Restrictions Per Provider Order: No       Mobility Bed Mobility   Bed Mobility: Supine to Sit, Sit to Supine       Sit to supine: Supervision   General bed mobility comments: Increased time and pt sitting up from reclined position to avoid rolling to R side    Transfers       Sit to Stand: Contact guard assist, Supervision           General transfer comment: contact guard to close supervsion for safety, cueing for safety and hand placement     Balance Overall balance assessment: Needs assistance Sitting-balance support: No upper extremity supported, Feet supported Sitting balance-Leahy Scale: Fair     Standing balance support: No upper extremity supported, Bilateral upper  extremity supported, Single extremity supported, During functional activity Standing balance-Leahy Scale: Fair Standing balance comment: relies on RW dynamically, able to engage in ADLs with close supervision and 0-1 hand support                           ADL either performed or assessed with clinical judgement   ADL Overall ADL's : Needs assistance/impaired     Grooming: Supervision/safety;Wash/dry hands;Standing               Lower Body Dressing: Supervision/safety;Sit to/from stand   Toilet Transfer: Supervision/safety;Ambulation Toilet Transfer Details (indicate cue type and reason): cueing for hand placement and safety     Tub/ Shower Transfer: Tub transfer;Contact guard assist;Ambulation;Rolling walker (2 wheels);Shower Field seismologist Details (indicate cue type and reason): simulated in room, pt will have support from spouse Functional mobility during ADLs: Supervision/safety;Rolling walker (2 wheels)      Extremity/Trunk Assessment              Vision       Perception     Praxis     Communication Communication Communication: No apparent difficulties   Cognition     Cognition: No apparent impairments             OT - Cognition Comments: intermittent cueing for safety                          Cueing      Exercises  Shoulder Instructions       General Comments pt on supplemental O2 upon entry, 92-93% on RA; reviewed fall prevention techniques and home safety    Pertinent Vitals/ Pain       Pain Assessment Pain Assessment: Faces Faces Pain Scale: Hurts little more Pain Location: ribs Pain Descriptors / Indicators: Discomfort Pain Intervention(s): Limited activity within patient's tolerance, Monitored during session, Repositioned  Home Living                                          Prior Functioning/Environment              Frequency  Min 2X/week        Progress Toward  Goals  OT Goals(current goals can now be found in the care plan section)  Progress towards OT goals: Progressing toward goals  Acute Rehab OT Goals Patient Stated Goal: home today OT Goal Formulation: With patient Time For Goal Achievement: 02/05/24 Potential to Achieve Goals: Good  Plan      Co-evaluation                 AM-PAC OT "6 Clicks" Daily Activity     Outcome Measure   Help from another person eating meals?: None Help from another person taking care of personal grooming?: A Little Help from another person toileting, which includes using toliet, bedpan, or urinal?: A Little Help from another person bathing (including washing, rinsing, drying)?: A Little Help from another person to put on and taking off regular upper body clothing?: A Little Help from another person to put on and taking off regular lower body clothing?: A Little 6 Click Score: 19    End of Session Equipment Utilized During Treatment: Rolling walker (2 wheels)  OT Visit Diagnosis: Unsteadiness on feet (R26.81);Other abnormalities of gait and mobility (R26.89);Repeated falls (R29.6);History of falling (Z91.81);Muscle weakness (generalized) (M62.81)   Activity Tolerance Patient tolerated treatment well   Patient Left in bed;with call bell/phone within reach;with bed alarm set   Nurse Communication Mobility status;Other (comment) (on RA)        Time: 1610-9604 OT Time Calculation (min): 12 min  Charges: OT General Charges $OT Visit: 1 Visit OT Treatments $Self Care/Home Management : 8-22 mins  Bary Boss, OT Acute Rehabilitation Services Office [phone] Secure Chat Preferred    Fredrich Jefferson 01/24/2024, 9:28 AM

## 2024-01-24 NOTE — Discharge Summary (Addendum)
 Physician Discharge Summary   Patient: [name] MRN: [id number] DOB: Sep 18, 1950  Admit date:     01/21/2024  Discharge date: 01/24/24  Discharge Physician: Aura Leeds, DO   PCP: Pcp, No   Recommendations at discharge:   Follow up w/ PCP w/in 1-2 weeks and repeat CBC, CMP, Mag, Phos w/in 1 week Follow up with Trauma Surgery in the outpatient setting.  Avoid Alcohol  Repeat chest x-ray in 3 to 6 weeks  Discharge Diagnoses: Principal Problem:   Fall Active Problems:   History of liver transplant (HCC)   ALCOHOL ABUSE, HX OF   Hypertension   CKD (chronic kidney disease) stage 3, GFR 30-59 ml/min (HCC)   Rib fractures  Resolved Problems:   * No resolved hospital problems. 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  He reports that he drank about a pint of liquor and was found to be hypotensive with SBP in the 90s.  He received IV fluids in the ED and complained of right-sided chest wall pain and further workup was done and he was found to have an acute right 3 through 9 rib fractures.  Trauma surgery was consulted and recommending multimodal management.  Continued to complain of pain with deep inspiration with improvement with Oxy.  Trauma surgery is now signing off as of 01/23/2024.  Patient was ambulating with physical therapy and home O2 screen was done and did not desaturate.  Chest x-ray remained stable and he did not show any signs of withdrawal.  He is medically stable for discharge at this time and will need continued incentive spirometry and discontinue drinking.  Pain is fairly well-controlled on oral oxycodone  so he will be discharged home on a short course with PCP to  refill  Assessment and Plan:  Fall in the setting of Alcohol Intoxication: Patient was hypotensive when EMS arrived.  Most likely as result of alcoholism.  Patient is being aggressively hydrated and blood pressure improved..  Will admit.  Pain control.  Counseled on alcohol intake.  PT OT consulted and recommending HH.  Was not orthostatic at the time of discharge and ambulatory home O2 screen was done and he did not require oxygen.   Acute Ribs 3-9 Fx with small Right Hemothorax in the setting of Fall Patient will be admitted for pain management.  Initiate incentive spirometer with pain control.  PT/OT recommend HH.  On IV fentanyl  now and also getting oxycodone  and Robaxin .  Repeat chest x-ray done 5/18 and shows mild right effusion hemothorax is stable.  Repeat chest x-ray today was stable and showed Probable trace bilateral pleural effusions and bibasilar atelectasis or infiltrate.  Continue with Incentive Spirometry patient was able to do more today.  Repeat chest x-ray in 3 to 6 weeks   Alcohol Abuse: Initiate CIWA protocol w/ Lorazepam  and CTM for Withdrawls.  Counseling provided.  No signs of withdrawal at the time of discharge.  Hypoxemia: In the setting of Rib Fx's.  He remainedon 2 L supplemental oxygen via nasal cannula and this was removed for assessment and his O2 sats decreased to 89 to 91%.  This is reapplied and his O2 saturations are improved.  Repeat chest x-ray as above and  ambulatory home O2 screen done and he did not desaturate.   History of Liver Transplant x2: Resume Home Tacrolimus . CTM    AKI on CKD Stage 3b: BUN/Cr Trend went from 30/2.99 -> 20/2.50 -> 17/2.32 and is stable at the time of discharge at 17/2.3 . IV fluid hydration with D5 and LR at 100 mL/h x 1 day now stopped. Avoid Nephrotoxic Medications, Contrast Dyes, Hypotension and Dehydration to Ensure Adequate Renal Perfusion and will need to Renally Adjust Meds. CTM and Trend Renal Function carefully and repeat CMP  in the AM   Constipation: Bowel regimen initiated w/ Senna-Docusate 1 tab po BID, Miralax  17 grams BID, and Bisacodyl  Suppository 10 mg RC.  Continue with bowel regimen outpatient setting  Hyperbilirubinemia: Mild and improved. Likely was reactive. T Bili went from 1.4 -> 1.1 and is 1.0.. CTM and Trend and repeat CMP in the AM  Hyperkalemia: Mild. K+ is 5.2 yesterday and he was given 10 g of Lokelma  x1 with improvement and now his potassium is 4.0. CTM and Trend and repeat CMP within 1 week  HypoNa+: Mild. Na+ is now went from 134 is now 132. CTM and Trend and repeat CMP within 1 week  Essential HTN: Will resume home regimen continue Amlodipine  5 mg p.o. daily and Metoprolol  Tartrate 50 g p.o. twice daily. CTM BP per Protocol. Last BP reading was a little elevated 167/83  Normocytic Anemia/Anemia of Chronic Disease: Hgb/Hct Trend fairly stable:  Recent Labs  Lab 01/21/24 1552 01/22/24 0002 01/22/24 0847 01/22/24 0900 01/23/24 0935 01/24/24 0810  HGB 9.1* 9.8* 10.9* 9.9* 9.9* 10.1*  HCT 28.2* 29.3* 33.3* 30.2* 30.4* 31.2*  MCV 96.6 95.1 94.9 94.4 95.6 95.1  -Checked Anemia Panel and it showed an iron level 18, UIBC 143, TIBC 161, saturation ration of 11%, ferritin of 1037, folate of 27.4, vitamin B12 533.  Initiate iron supplementation in outpatient setting CTM for S/Sx of Bleeding; No overt bleeding noted. Repeat CBC in the AM   Hypoalbuminemia: Patient's Albumin Level went from 2.7 -> 3.1 -> 2.7 and is now 2.8. CTM and Trend Repeat CMP in the AM  Consultants: Trauma surgery Procedures performed: As delineated above Disposition: Home health Diet recommendation:  Discharge Diet Orders (From admission, onward)     Start     Ordered   01/24/24 0000  Diet - low sodium heart healthy        01/24/24 1259           Cardiac diet DISCHARGE MEDICATION: Allergies as of 01/24/2024       Reactions   Tape Other (See Comments)   SKIN TEARS VERY EASILY!!!!! Can tolerate ONLY paper  tape.   Zolpidem Other (See Comments)   Confusion   Moxifloxacin Rash, Other (See Comments)   Achilles tendon pain also        Medication List     STOP taking these medications    oxyCODONE -acetaminophen  5-325 MG tablet Commonly known as: PERCOCET/ROXICET       TAKE these medications    amLODipine  5 MG tablet Commonly known as: NORVASC  Take 5 mg by mouth daily.   ascorbic acid 500 MG tablet Commonly known as: VITAMIN C Take 500 mg by mouth daily.   buPROPion  150 MG 12 hr tablet Commonly known as: WELLBUTRIN  SR Take 1 tablet (150 mg total) by mouth daily. What changed: when to take this   CITRACAL CALCIUM+D PO Take 1 tablet by mouth daily.   famotidine  40 MG tablet Commonly known  as: PEPCID  Take 40 mg by mouth at bedtime.   feeding supplement Liqd Take 237 mLs by mouth 2 (two) times daily between meals. What changed:  when to take this reasons to take this   folic acid  1 MG tablet Commonly known as: FOLVITE  Take 1 tablet (1 mg total) by mouth daily. Start taking on: Jan 25, 2024   guaiFENesin  600 MG 12 hr tablet Commonly known as: MUCINEX  Take 1 tablet (600 mg total) by mouth 2 (two) times daily as needed for up to 7 days for to loosen phlegm or cough.   Magnesium  Oxide 400 MG Caps Take 400 mg by mouth daily as needed (cramps).   methocarbamol  500 MG tablet Commonly known as: ROBAXIN  Take 1 tablet (500 mg total) by mouth every 8 (eight) hours as needed for muscle spasms.   metoprolol  tartrate 50 MG tablet Commonly known as: LOPRESSOR  Take 1 tablet (50 mg total) by mouth 2 (two) times daily.   multivitamin tablet Take 1 tablet by mouth daily with breakfast.   ondansetron  4 MG tablet Commonly known as: ZOFRAN  Take 1 tablet (4 mg total) by mouth every 6 (six) hours as needed for nausea.   oxyCODONE  5 MG immediate release tablet Commonly known as: Oxy IR/ROXICODONE  Take 1 tablet (5 mg total) by mouth every 6 (six) hours as needed for up to 3  days for severe pain (pain score 7-10) (5mg  for moderate pain, 10mg  for severe pain).   pantoprazole  40 MG tablet Commonly known as: PROTONIX  Take 1 tablet (40 mg total) by mouth daily.   polyethylene glycol 17 g packet Commonly known as: MIRALAX  / GLYCOLAX  Take 17 g by mouth daily.   senna-docusate 8.6-50 MG tablet Commonly known as: Senokot-S Take 1 tablet by mouth at bedtime.   sodium bicarbonate  650 MG tablet Take 2 tablets (1,300 mg total) by mouth 2 (two) times daily. What changed: how much to take   tacrolimus  1 MG capsule Commonly known as: PROGRAF  Take 1 mg by mouth in the morning and at bedtime.   tamsulosin  0.4 MG Caps capsule Commonly known as: FLOMAX  Take 0.4 mg by mouth daily.   thiamine  100 MG tablet Commonly known as: Vitamin B-1 Take 1 tablet (100 mg total) by mouth daily. Start taking on: Jan 25, 2024   traZODone  100 MG tablet Commonly known as: DESYREL  Take 100 mg by mouth at bedtime as needed for sleep.        Follow-up Information     Francenia Ingle, NP Follow up.   Specialty: Family Medicine Why: TIME: 1:00 PM  PLEASE ARRIVE AT 12:30 PM DATE:  [id number]  PLEASE BRING ALL MEDICATION, ID and INS CARD Contact information: [address] [place] [facility] [place] [address] [phone]                Discharge Exam: Filed Weights   01/21/24 1550  Weight: 62.1 kg   Vitals:   01/24/24 0440 01/24/24 0822  BP: 136/86 (!) 167/83  Pulse: 72 82  Resp: 16 17  Temp: 98.1 F (36.7 C) 98.1 F (36.7 C)  SpO2: 100% 93%   Examination: Physical Exam:  Constitutional: Thin elderly Caucasian male in no acute distress who appears older than the stated age and appears calm and comfortable Respiratory: Diminished to auscultation bilaterally with some coarse breath sounds and some slight rhonchi and crackles noted on the right compared to left.  Has a normal respiratory effort and is not tachypneic or using supplemental oxygen via nasal  cannula Cardiovascular: RRR, no murmurs / rubs / gallops. S1 and S2 auscultated. No extremity edema.  Abdomen: Soft, non-tender, non-distended. Bowel sounds positive.  GU: Deferred. Musculoskeletal: No clubbing / cyanosis of digits/nails. No joint deformity upper and lower extremities.  Skin: No rashes, lesions, ulcers on limited skin evaluation. No induration; Warm and dry.  Neurologic: CN 2-12 grossly intact with no focal deficits. Romberg sign and cerebellar reflexes not assessed.  Psychiatric: Normal judgment and insight. Alert and oriented x 3. Normal mood and appropriate affect.   Condition at discharge: stable  The results of significant diagnostics from this hospitalization (including imaging, microbiology, ancillary and laboratory) are listed below for reference.   Imaging Studies: DG CHEST PORT 1 VIEW Result Date: 01/24/2024 CLINICAL DATA:  Shortness of breath. EXAM: PORTABLE CHEST 1 VIEW COMPARISON:  Chest radiograph dated 01/22/2024. FINDINGS: Bibasilar densities may represent trace effusion and associated atelectasis or infiltrate. No pneumothorax. Stable cardiac silhouette. Multiple right rib fractures. IMPRESSION: Probable trace bilateral pleural effusions and bibasilar atelectasis or infiltrate. Electronically Signed   By: Angus Bark M.D.   On: 01/24/2024 09:39   DG CHEST PORT 1 VIEW Result Date: 01/22/2024 CLINICAL DATA:  Right hemothorax. EXAM: PORTABLE CHEST 1 VIEW COMPARISON:  Jan 21, 2024. FINDINGS: Stable cardiomediastinal silhouette. Stable right rib fractures. No definite pneumothorax is noted currently. Minimal right basilar subsegmental atelectasis is noted with small pleural effusion or hemothorax. Minimal left basilar subsegmental atelectasis is noted. IMPRESSION: Stable right rib fractures are noted with minimal right basilar subsegmental atelectasis with small right pleural effusion or hemothorax. Electronically Signed   By: Rosalene Colon M.D.   On: 01/22/2024  11:56   CT CHEST ABDOMEN PELVIS WO CONTRAST Result Date: 01/21/2024 CLINICAL DATA:  Blunt abdominal trauma EXAM: CT CHEST, ABDOMEN AND PELVIS WITHOUT CONTRAST TECHNIQUE: Multidetector CT imaging of the chest, abdomen and pelvis was performed following the standard protocol without IV contrast. Examination was performed without IV contrast at the request of the ordering clinician. Assessment of the vasculature, soft tissues, and solid viscera is limited without IV contrast, especially in the setting of trauma. RADIATION DOSE REDUCTION: This exam was performed according to the departmental dose-optimization program which includes automated exposure control, adjustment of the mA and/or kV according to patient size and/or use of iterative reconstruction technique. COMPARISON:  01/22/2023, 11/14/2023, 01/21/2024 FINDINGS: CT CHEST FINDINGS Cardiovascular: Unenhanced imaging of the heart demonstrates mild cardiomegaly without pericardial effusion. Normal caliber of the thoracic aorta. Atherosclerosis of the aorta and coronary vasculature. Assessment of the vascular lumen cannot be performed without intravenous contrast. Mediastinum/Nodes: No enlarged mediastinal, hilar, or axillary lymph nodes. Thyroid gland, trachea, and esophagus demonstrate no significant findings. Lungs/Pleura: Bibasilar atelectasis, right greater than left. No acute airspace disease. Minimal right pleural fluid is identified adjacent multiple rib fractures, consistent with small hemothorax. No evidence of pneumothorax. Central airways are patent. Musculoskeletal: There are displaced segmental right third through ninth rib fractures. There are other prior healed bilateral rib fractures noted. Stable chronic compression deformities at T3 and T9. Prior healed sternal fracture. Reconstructed images demonstrate no additional findings. CT ABDOMEN PELVIS FINDINGS Hepatobiliary: Prior cholecystectomy. Pneumobilia likely related to prior sphincterotomy or  biliary duct manipulation. Unremarkable unenhanced appearance of the liver. Pancreas: Unremarkable unenhanced appearance. Spleen: Unremarkable unenhanced appearance. Adrenals/Urinary Tract: No urinary tract calculi or obstructive uropathy. The adrenals and bladder are unremarkable. Stomach/Bowel: No bowel obstruction or ileus. Normal appendix right lower quadrant. No bowel wall thickening or inflammatory change. Vascular/Lymphatic: Aortic atherosclerosis. No enlarged  abdominal or pelvic lymph nodes. Reproductive: Prostate is unremarkable. Other: No free fluid or free intraperitoneal gas. No abdominal wall hernia. Musculoskeletal: There are subacute healing fractures through the right superior and inferior pubic rami, right sacral ala, and left parasymphyseal region. Chronic compression deformity noted at the L1 level unchanged. No acute fractures. Severe spondylosis and facet hypertrophy at L4-5 again noted. Reconstructed images demonstrate no additional findings. IMPRESSION: 1. Acute displaced segmental right third through ninth rib fractures, with small associated hemothorax. No evidence of pneumothorax. 2. No acute intra-abdominal or intrapelvic trauma identified on this unenhanced exam. 3. Aortic Atherosclerosis (ICD10-I70.0). Coronary artery atherosclerosis. Electronically Signed   By: Bobbye Burrow M.D.   On: 01/21/2024 18:45   CT Cervical Spine Wo Contrast Result Date: 01/21/2024 CLINICAL DATA:  Neck trauma (Age >= 65y) EXAM: CT CERVICAL SPINE WITHOUT CONTRAST TECHNIQUE: Multidetector CT imaging of the cervical spine was performed without intravenous contrast. Multiplanar CT image reconstructions were also generated. RADIATION DOSE REDUCTION: This exam was performed according to the departmental dose-optimization program which includes automated exposure control, adjustment of the mA and/or kV according to patient size and/or use of iterative reconstruction technique. COMPARISON:  CT 02/07/2023 FINDINGS:  Alignment: No traumatic subluxation. Mild broad-based rightward curvature. Trace retrolisthesis at C3-C4 is unchanged from prior exam. Skull base and vertebrae: No acute fracture. Advanced chronic degenerative change at C1-C2 with pannus. T3 superior endplate compression deformity was present on prior exam. Soft tissues and spinal canal: No prevertebral fluid or swelling. No visible canal hematoma.21 Disc levels: Diffuse degenerative disc disease with mild multilevel facet hypertrophy. Upper chest: Assessed on concurrent chest CT, reported separately. Other: Scattered air within the soft tissues about the right neck may be related to right rib fractures. IMPRESSION: 1. No acute fracture or subluxation of the cervical spine. 2. Multilevel degenerative change throughout the cervical spine. Electronically Signed   By: Chadwick Colonel M.D.   On: 01/21/2024 18:44   CT Head Wo Contrast Result Date: 01/21/2024 CLINICAL DATA:  Head trauma, minor (Age >= 65y) Status post fall. EXAM: CT HEAD WITHOUT CONTRAST TECHNIQUE: Contiguous axial images were obtained from the base of the skull through the vertex without intravenous contrast. RADIATION DOSE REDUCTION: This exam was performed according to the departmental dose-optimization program which includes automated exposure control, adjustment of the mA and/or kV according to patient size and/or use of iterative reconstruction technique. COMPARISON:  Head CT 02/07/2023 FINDINGS: Brain: No intracranial hemorrhage, mass effect, or midline shift. Stable degree of atrophy and chronic small vessel ischemia. No hydrocephalus. The basilar cisterns are patent. Chronic bilateral basal gangliar mineralization. No evidence of territorial infarct or acute ischemia. No extra-axial or intracranial fluid collection. Vascular: Atherosclerosis of skullbase vasculature without hyperdense vessel or abnormal calcification. Gas in the cavernous sinus is typically related to IV catheters. Skull: No  fracture or focal lesion. Sinuses/Orbits: No acute finding. No evidence of acute fracture. Chronic opacification of lower right mastoid air cells. Other: No confluent scalp hematoma. IMPRESSION: 1. No acute intracranial abnormality. No skull fracture. 2. Stable atrophy and chronic small vessel ischemia. Electronically Signed   By: Chadwick Colonel M.D.   On: 01/21/2024 18:38   DG Chest Portable 1 View Result Date: 01/21/2024 CLINICAL DATA:  Fall on right side. EXAM: PORTABLE CHEST 1 VIEW COMPARISON:  February 12, 2023. FINDINGS: Stable cardiomediastinal silhouette. Left lung is clear. Severely displaced fractures are seen involving the lateral portions of the right third through eighth ribs. Minimal right apical pneumothorax is noted. Minimal  right basilar subsegmental atelectasis is noted. IMPRESSION: Minimal right apical pneumothorax is noted secondary to severely displaced fractures involving the lateral portions of the right third through eighth ribs. Electronically Signed   By: Rosalene Colon M.D.   On: 01/21/2024 15:58   Microbiology: Results for orders placed or performed during the hospital encounter of 02/12/23  Culture, blood (routine x 2)     Status: None   Collection Time: 02/12/23  7:00 PM   Specimen: BLOOD RIGHT ARM  Result Value Ref Range Status   Specimen Description BLOOD RIGHT ARM  Final   Special Requests   Final    BOTTLES DRAWN AEROBIC AND ANAEROBIC Blood Culture results may not be optimal due to an excessive volume of blood received in culture bottles   Culture   Final    NO GROWTH 5 DAYS Performed at [facility], [address], [place], [place] [address]    Report Status 02/17/2023 FINAL  Final  Culture, blood (routine x 2)     Status: None   Collection Time: 02/12/23  7:15 PM   Specimen: BLOOD RIGHT HAND  Result Value Ref Range Status   Specimen Description BLOOD RIGHT HAND  Final   Special Requests   Final    BOTTLES DRAWN AEROBIC ONLY Blood Culture adequate  volume   Culture   Final    NO GROWTH 5 DAYS Performed at [facility], [address] [place], [place] [address]    Report Status 02/17/2023 FINAL  Final   Labs: CBC: Recent Labs  Lab 01/21/24 1552 01/22/24 0002 01/22/24 0847 01/22/24 0900 01/23/24 0935 01/24/24 0810  WBC 6.8 4.7 5.6 4.9 5.5 4.1  NEUTROABS 4.8  --   --  3.5 3.9 2.9  HGB 9.1* 9.8* 10.9* 9.9* 9.9* 10.1*  HCT 28.2* 29.3* 33.3* 30.2* 30.4* 31.2*  MCV 96.6 95.1 94.9 94.4 95.6 95.1  PLT 203 201 183 204 153 168   Basic Metabolic Panel: Recent Labs  Lab 01/21/24 1552 01/22/24 0002 01/22/24 0847 01/22/24 0900 01/23/24 0935 01/24/24 0810  NA 135  --  135  --  134* 132*  K 3.8  --  4.1  --  5.2* 4.0  CL 104  --  101  --  99 95*  CO2 22  --  23  --  28 31  GLUCOSE 90  --  104*  --  113* 95  BUN 30*  --  20  --  17 17  CREATININE 2.99* 2.72* 2.50*  --  2.32* 2.31*  CALCIUM 8.5*  --  9.2  --  9.1 9.0  MG  --   --   --  1.5* 2.3 1.9  PHOS  --   --   --  2.9 2.6 3.3   Liver Function Tests: Recent Labs  Lab 01/21/24 1552 01/22/24 0847 01/23/24 0935 01/24/24 0810  AST 35 39 31 22  ALT 14 19 16 14   ALKPHOS 71 85 86 91  BILITOT 0.8 1.4* 1.0 1.0  PROT 5.6* 6.5 5.9* 6.4*  ALBUMIN 2.7* 3.1* 2.7* 2.8*   CBG: Recent Labs  Lab 01/23/24 1141 01/23/24 1559 01/23/24 1953 01/24/24 0824 01/24/24 1203  GLUCAP 109* 125* 113* 97 110*   Discharge time spent: greater than 30 minutes.  Signed: Aura Leeds, DO [facility] 01/24/2024

## 2024-01-24 NOTE — Progress Notes (Signed)
 DISCHARGE NOTE HOME [name] to be discharged Home per MD order. Discussed prescriptions and follow up appointments with the patient. Prescriptions given to patient; medication list explained in detail. Patient verbalized understanding.  Skin clean, dry and intact without evidence of skin break down, no evidence of skin tears noted. IV catheter discontinued intact. Site without signs and symptoms of complications. Dressing and pressure applied. Pt denies pain at the site currently. No complaints noted.  Patient free of lines, drains, and wounds.   An After Visit Summary (AVS) was printed and given to the patient. Patient escorted via wheelchair, and discharged home via private auto.  Tonda Francisco, RNDISCHARGE NOTE HOME [name] to be discharged Home per MD order. Discussed prescriptions and follow up appointments with the patient. Prescriptions given to patient; medication list explained in detail. Patient verbalized understanding.  Skin clean, dry and intact without evidence of skin break down, no evidence of skin tears noted. IV catheter discontinued intact. Site without signs and symptoms of complications. Dressing and pressure applied. Pt denies pain at the site currently. No complaints noted.  Patient free of lines, drains, and wounds.   An After Visit Summary (AVS) was printed and given to the patient. Patient escorted via wheelchair, and discharged home via private auto.  Tonda Francisco, RN

## 2024-01-24 NOTE — Progress Notes (Signed)
 Physical Therapy Treatment Patient Details Name: [name] MRN: [id number] DOB: 1951-07-03 Today's Date: 01/24/2024   History of Present Illness Pt is a 73 y.o. male admitted 5/17 for fall in home while intoxicated. Imaging showed R ribs 3-9 fxs and Small hemothorax. PMH: alcohol use, cirrhosis s/p liver transplant x2, HTN    PT Comments  Pt received in supine and agreeable to session. Pt able to sit to EOB by elevating trunk from reclined position to avoid rolling to R side. Pt reports he has been sleeping on the couch PTA and always places multiple pillows to avoid laying flat. Pt able to tolerate gait trial this session with CGA for safety. Pt activity tolerance limited by pain and fatigue. Education on pursed lip breathing and energy conservation. Pt reports use of pulse oximeter at home and SpO2 noted to be 92% on RA post ambulation. Pt continues to benefit from PT services to progress toward functional mobility goals.    If plan is discharge home, recommend the following: A little help with walking and/or transfers;A little help with bathing/dressing/bathroom;Assistance with cooking/housework;Assist for transportation;Help with stairs or ramp for entrance   Can travel by private vehicle        Equipment Recommendations  None recommended by PT    Recommendations for Other Services       Precautions / Restrictions Precautions Precautions: Fall Recall of Precautions/Restrictions: Intact Precaution/Restrictions Comments: R rib fxs Restrictions Weight Bearing Restrictions Per Provider Order: No     Mobility  Bed Mobility Overal bed mobility: Needs Assistance Bed Mobility: Supine to Sit     Supine to sit: Supervision          Transfers Overall transfer level: Needs assistance Equipment used: Rolling walker (2 wheels) Transfers: Sit to/from Stand             General transfer comment: From EOB with CGA progressing to supervision for safety     Ambulation/Gait Ambulation/Gait assistance: Contact guard assist Gait Distance (Feet): 65 Feet Assistive device: Rolling walker (2 wheels) Gait Pattern/deviations: Step-through pattern, Trunk flexed, Decreased stride length       General Gait Details: Slow, guarded gait with cues for upright posture   Stairs             Wheelchair Mobility     Tilt Bed    Modified Rankin (Stroke Patients Only)       Balance Overall balance assessment: Needs assistance Sitting-balance support: No upper extremity supported, Feet supported Sitting balance-Leahy Scale: Fair Sitting balance - Comments: sitting EOB   Standing balance support: Bilateral upper extremity supported, During functional activity Standing balance-Leahy Scale: Fair Standing balance comment: with RW support                            Communication Communication Communication: No apparent difficulties  Cognition Arousal: Alert Behavior During Therapy: WFL for tasks assessed/performed   PT - Cognitive impairments: No apparent impairments                         Following commands: Intact      Cueing Cueing Techniques: Verbal cues  Exercises      General Comments General comments (skin integrity, edema, etc.): SpO2 92% on RA post ambulation      Pertinent Vitals/Pain Pain Assessment Pain Assessment: Faces Faces Pain Scale: Hurts little more Pain Location: ribs Pain Descriptors / Indicators: Discomfort Pain Intervention(s): Limited activity within patient's  tolerance, Premedicated before session, Monitored during session     PT Goals (current goals can now be found in the care plan section) Acute Rehab PT Goals Patient Stated Goal: to go home PT Goal Formulation: With patient Time For Goal Achievement: 02/05/24 Progress towards PT goals: Progressing toward goals    Frequency    Min 2X/week       AM-PAC PT "6 Clicks" Mobility   Outcome Measure  Help needed  turning from your back to your side while in a flat bed without using bedrails?: A Little Help needed moving from lying on your back to sitting on the side of a flat bed without using bedrails?: A Little Help needed moving to and from a bed to a chair (including a wheelchair)?: A Little Help needed standing up from a chair using your arms (e.g., wheelchair or bedside chair)?: A Little Help needed to walk in hospital room?: A Little Help needed climbing 3-5 steps with a railing? : A Lot 6 Click Score: 17    End of Session   Activity Tolerance: Patient tolerated treatment well Patient left: in bed;with call bell/phone within reach;with bed alarm set Nurse Communication: Mobility status PT Visit Diagnosis: Unsteadiness on feet (R26.81);Other abnormalities of gait and mobility (R26.89);Muscle weakness (generalized) (M62.81)     Time: 6962-9528 PT Time Calculation (min) (ACUTE ONLY): 11 min  Charges:    $Gait Training: 8-22 mins PT General Charges $$ ACUTE PT VISIT: 1 Visit                    Michaelle Adolphus, PTA Acute Rehabilitation Services Secure Chat Preferred  [phone]    Michaelle Adolphus 01/24/2024, 9:42 AM

## 2024-02-01 ENCOUNTER — Emergency Department (HOSPITAL_COMMUNITY): Payer: Medicare (Managed Care)

## 2024-02-01 ENCOUNTER — Inpatient Hospital Stay (HOSPITAL_COMMUNITY)
Admission: EM | Admit: 2024-02-01 | Discharge: 2024-03-06 | DRG: 082 | Disposition: A | Discharge status: Skilled Nursing Facility | Payer: Medicare (Managed Care) | Attending: General Surgery | Admitting: General Surgery

## 2024-02-01 ENCOUNTER — Encounter (HOSPITAL_COMMUNITY): Payer: Self-pay | Admitting: General Surgery

## 2024-02-01 DIAGNOSIS — N184 Chronic kidney disease, stage 4 (severe): Secondary | ICD-10-CM | POA: Diagnosis present

## 2024-02-01 DIAGNOSIS — R809 Proteinuria, unspecified: Secondary | ICD-10-CM | POA: Diagnosis present

## 2024-02-01 DIAGNOSIS — Z888 Allergy status to other drugs, medicaments and biological substances status: Secondary | ICD-10-CM

## 2024-02-01 DIAGNOSIS — R000 Tachycardia, unspecified: Secondary | ICD-10-CM | POA: Diagnosis not present

## 2024-02-01 DIAGNOSIS — Y92230 Patient room in hospital as the place of occurrence of the external cause: Secondary | ICD-10-CM | POA: Diagnosis not present

## 2024-02-01 DIAGNOSIS — W06XXXA Fall from bed, initial encounter: Secondary | ICD-10-CM | POA: Diagnosis not present

## 2024-02-01 DIAGNOSIS — S2243XA Multiple fractures of ribs, bilateral, initial encounter for closed fracture: Secondary | ICD-10-CM | POA: Diagnosis present

## 2024-02-01 DIAGNOSIS — N179 Acute kidney failure, unspecified: Secondary | ICD-10-CM | POA: Diagnosis present

## 2024-02-01 DIAGNOSIS — S065XAA Traumatic subdural hemorrhage with loss of consciousness status unknown, initial encounter: Principal | ICD-10-CM | POA: Diagnosis present

## 2024-02-01 DIAGNOSIS — I480 Paroxysmal atrial fibrillation: Secondary | ICD-10-CM | POA: Diagnosis present

## 2024-02-01 DIAGNOSIS — T451X5A Adverse effect of antineoplastic and immunosuppressive drugs, initial encounter: Secondary | ICD-10-CM | POA: Diagnosis present

## 2024-02-01 DIAGNOSIS — S0211GA Other fracture of occiput, right side, initial encounter for closed fracture: Secondary | ICD-10-CM | POA: Diagnosis present

## 2024-02-01 DIAGNOSIS — Z7189 Other specified counseling: Secondary | ICD-10-CM | POA: Diagnosis not present

## 2024-02-01 DIAGNOSIS — Y92009 Unspecified place in unspecified non-institutional (private) residence as the place of occurrence of the external cause: Secondary | ICD-10-CM | POA: Diagnosis not present

## 2024-02-01 DIAGNOSIS — R402112 Coma scale, eyes open, never, at arrival to emergency department: Secondary | ICD-10-CM | POA: Diagnosis present

## 2024-02-01 DIAGNOSIS — E871 Hypo-osmolality and hyponatremia: Secondary | ICD-10-CM | POA: Diagnosis present

## 2024-02-01 DIAGNOSIS — E872 Acidosis, unspecified: Secondary | ICD-10-CM | POA: Diagnosis present

## 2024-02-01 DIAGNOSIS — F10139 Alcohol abuse with withdrawal, unspecified: Secondary | ICD-10-CM | POA: Diagnosis present

## 2024-02-01 DIAGNOSIS — W109XXA Fall (on) (from) unspecified stairs and steps, initial encounter: Secondary | ICD-10-CM | POA: Diagnosis present

## 2024-02-01 DIAGNOSIS — I4891 Unspecified atrial fibrillation: Secondary | ICD-10-CM | POA: Diagnosis not present

## 2024-02-01 DIAGNOSIS — E43 Unspecified severe protein-calorie malnutrition: Secondary | ICD-10-CM | POA: Diagnosis present

## 2024-02-01 DIAGNOSIS — C4490 Unspecified malignant neoplasm of skin, unspecified: Secondary | ICD-10-CM | POA: Diagnosis present

## 2024-02-01 DIAGNOSIS — B961 Klebsiella pneumoniae [K. pneumoniae] as the cause of diseases classified elsewhere: Secondary | ICD-10-CM | POA: Diagnosis present

## 2024-02-01 DIAGNOSIS — R402212 Coma scale, best verbal response, none, at arrival to emergency department: Secondary | ICD-10-CM | POA: Diagnosis present

## 2024-02-01 DIAGNOSIS — M542 Cervicalgia: Secondary | ICD-10-CM | POA: Diagnosis not present

## 2024-02-01 DIAGNOSIS — F32A Depression, unspecified: Secondary | ICD-10-CM | POA: Diagnosis present

## 2024-02-01 DIAGNOSIS — D631 Anemia in chronic kidney disease: Secondary | ICD-10-CM | POA: Diagnosis present

## 2024-02-01 DIAGNOSIS — Z79899 Other long term (current) drug therapy: Secondary | ICD-10-CM

## 2024-02-01 DIAGNOSIS — Z681 Body mass index (BMI) 19 or less, adult: Secondary | ICD-10-CM | POA: Diagnosis not present

## 2024-02-01 DIAGNOSIS — K7030 Alcoholic cirrhosis of liver without ascites: Secondary | ICD-10-CM | POA: Diagnosis present

## 2024-02-01 DIAGNOSIS — J9601 Acute respiratory failure with hypoxia: Secondary | ICD-10-CM | POA: Diagnosis present

## 2024-02-01 DIAGNOSIS — N390 Urinary tract infection, site not specified: Secondary | ICD-10-CM | POA: Diagnosis present

## 2024-02-01 DIAGNOSIS — W19XXXA Unspecified fall, initial encounter: Principal | ICD-10-CM

## 2024-02-01 DIAGNOSIS — R627 Adult failure to thrive: Secondary | ICD-10-CM | POA: Diagnosis present

## 2024-02-01 DIAGNOSIS — Z944 Liver transplant status: Secondary | ICD-10-CM

## 2024-02-01 DIAGNOSIS — R946 Abnormal results of thyroid function studies: Secondary | ICD-10-CM | POA: Diagnosis present

## 2024-02-01 DIAGNOSIS — R1310 Dysphagia, unspecified: Secondary | ICD-10-CM | POA: Diagnosis present

## 2024-02-01 DIAGNOSIS — S066XAA Traumatic subarachnoid hemorrhage with loss of consciousness status unknown, initial encounter: Secondary | ICD-10-CM | POA: Diagnosis present

## 2024-02-01 DIAGNOSIS — R569 Unspecified convulsions: Secondary | ICD-10-CM | POA: Diagnosis present

## 2024-02-01 DIAGNOSIS — R54 Age-related physical debility: Secondary | ICD-10-CM | POA: Diagnosis present

## 2024-02-01 DIAGNOSIS — Z9109 Other allergy status, other than to drugs and biological substances: Secondary | ICD-10-CM

## 2024-02-01 DIAGNOSIS — Z781 Physical restraint status: Secondary | ICD-10-CM

## 2024-02-01 DIAGNOSIS — R402312 Coma scale, best motor response, none, at arrival to emergency department: Secondary | ICD-10-CM | POA: Diagnosis present

## 2024-02-01 DIAGNOSIS — Z751 Person awaiting admission to adequate facility elsewhere: Secondary | ICD-10-CM

## 2024-02-01 DIAGNOSIS — Z6821 Body mass index (BMI) 21.0-21.9, adult: Secondary | ICD-10-CM

## 2024-02-01 DIAGNOSIS — R197 Diarrhea, unspecified: Secondary | ICD-10-CM | POA: Diagnosis not present

## 2024-02-01 DIAGNOSIS — R296 Repeated falls: Secondary | ICD-10-CM | POA: Diagnosis present

## 2024-02-01 DIAGNOSIS — Z515 Encounter for palliative care: Secondary | ICD-10-CM | POA: Diagnosis not present

## 2024-02-01 DIAGNOSIS — I959 Hypotension, unspecified: Secondary | ICD-10-CM | POA: Diagnosis not present

## 2024-02-01 DIAGNOSIS — S0003XA Contusion of scalp, initial encounter: Secondary | ICD-10-CM | POA: Diagnosis present

## 2024-02-01 DIAGNOSIS — E875 Hyperkalemia: Secondary | ICD-10-CM | POA: Diagnosis present

## 2024-02-01 LAB — CBC
HCT: 27.2 % — ABNORMAL LOW (ref 39.0–52.0)
Hemoglobin: 8.6 g/dL — ABNORMAL LOW (ref 13.0–17.0)
MCH: 30.2 pg (ref 26.0–34.0)
MCHC: 31.6 g/dL (ref 30.0–36.0)
MCV: 95.4 fL (ref 80.0–100.0)
Platelets: 232 10*3/uL (ref 150–400)
RBC: 2.85 MIL/uL — ABNORMAL LOW (ref 4.22–5.81)
RDW: 15.9 % — ABNORMAL HIGH (ref 11.5–15.5)
WBC: 5.9 10*3/uL (ref 4.0–10.5)
nRBC: 0 % (ref 0.0–0.2)

## 2024-02-01 LAB — ETHANOL: Alcohol, Ethyl (B): <15 mg/dL (ref <15)

## 2024-02-01 LAB — I-STAT CHEM 8, ED
BUN: 37 mg/dL — ABNORMAL HIGH (ref 8–23)
Calcium, Ion: 1.15 mmol/L (ref 1.15–1.40)
Chloride: 93 mmol/L — ABNORMAL LOW (ref 98–111)
Creatinine, Ser: 3.4 mg/dL — ABNORMAL HIGH (ref 0.61–1.24)
Glucose, Bld: 123 mg/dL — ABNORMAL HIGH (ref 70–99)
HCT: 26 % — ABNORMAL LOW (ref 39.0–52.0)
Hemoglobin: 8.8 g/dL — ABNORMAL LOW (ref 13.0–17.0)
Potassium: 4.3 mmol/L (ref 3.5–5.1)
Sodium: 131 mmol/L — ABNORMAL LOW (ref 135–145)
TCO2: 25 mmol/L (ref 22–32)

## 2024-02-01 LAB — SAMPLE TO BLOOD BANK

## 2024-02-01 LAB — COMPREHENSIVE METABOLIC PANEL WITH GFR
ALT: 14 U/L (ref 0–44)
AST: 23 U/L (ref 15–41)
Albumin: 2.4 g/dL — ABNORMAL LOW (ref 3.5–5.0)
Alkaline Phosphatase: 114 U/L (ref 38–126)
Anion gap: 8 (ref 5–15)
BUN: 37 mg/dL — ABNORMAL HIGH (ref 8–23)
CO2: 25 mmol/L (ref 22–32)
Calcium: 8.3 mg/dL — ABNORMAL LOW (ref 8.9–10.3)
Chloride: 96 mmol/L — ABNORMAL LOW (ref 98–111)
Creatinine, Ser: 3.09 mg/dL — ABNORMAL HIGH (ref 0.61–1.24)
GFR, Estimated: 21 mL/min — ABNORMAL LOW (ref >60)
Glucose, Bld: 126 mg/dL — ABNORMAL HIGH (ref 70–99)
Potassium: 4.3 mmol/L (ref 3.5–5.1)
Sodium: 129 mmol/L — ABNORMAL LOW (ref 135–145)
Total Bilirubin: 0.7 mg/dL (ref 0.0–1.2)
Total Protein: 5.6 g/dL — ABNORMAL LOW (ref 6.5–8.1)

## 2024-02-01 LAB — I-STAT ARTERIAL BLOOD GAS, ED
Acid-Base Excess: 2 mmol/L (ref 0.0–2.0)
Bicarbonate: 26.9 mmol/L (ref 20.0–28.0)
Calcium, Ion: 1.21 mmol/L (ref 1.15–1.40)
HCT: 26 % — ABNORMAL LOW (ref 39.0–52.0)
Hemoglobin: 8.8 g/dL — ABNORMAL LOW (ref 13.0–17.0)
O2 Saturation: 100 %
Patient temperature: 97.5
Potassium: 4 mmol/L (ref 3.5–5.1)
Sodium: 131 mmol/L — ABNORMAL LOW (ref 135–145)
TCO2: 28 mmol/L (ref 22–32)
pCO2 arterial: 40.8 mmHg (ref 32–48)
pH, Arterial: 7.425 (ref 7.35–7.45)
pO2, Arterial: 338 mmHg — ABNORMAL HIGH (ref 83–108)

## 2024-02-01 LAB — CDS SEROLOGY

## 2024-02-01 LAB — PROTIME-INR
INR: 1.1 (ref 0.8–1.2)
Prothrombin Time: 14 s (ref 11.4–15.2)

## 2024-02-01 MED ORDER — PROPOFOL 1000 MG/100ML IV EMUL
0.0000 ug/kg/min | INTRAVENOUS | Status: DC
  Administered 2024-02-01: 30 ug/kg/min via INTRAVENOUS
  Administered 2024-02-01: 10 ug/kg/min via INTRAVENOUS
  Administered 2024-02-02 (×2): 30 ug/kg/min via INTRAVENOUS
  Filled 2024-02-01 (×2): qty 100

## 2024-02-01 MED ORDER — FENTANYL 2500MCG IN NS 250ML (10MCG/ML) PREMIX INFUSION
INTRAVENOUS | Status: AC
  Administered 2024-02-01: 50 ug/h via INTRAVENOUS
  Filled 2024-02-01: qty 250

## 2024-02-01 MED ORDER — METHOCARBAMOL 500 MG PO TABS
500.0000 mg | ORAL_TABLET | Freq: Three times a day (TID) | ORAL | Status: AC
  Administered 2024-02-01 – 2024-02-04 (×5): 500 mg
  Filled 2024-02-01 (×5): qty 1

## 2024-02-01 MED ORDER — ORAL CARE MOUTH RINSE: 15.0000 mL | OROMUCOSAL | Status: DC | PRN

## 2024-02-01 MED ORDER — METHOCARBAMOL 500 MG PO TABS: 500.0000 mg | ORAL_TABLET | Freq: Three times a day (TID) | ORAL | Status: DC

## 2024-02-01 MED ORDER — FENTANYL 2500MCG IN NS 250ML (10MCG/ML) PREMIX INFUSION
0.0000 ug/h | INTRAVENOUS | Status: DC
  Administered 2024-02-02: 100 ug/h via INTRAVENOUS
  Filled 2024-02-01: qty 250

## 2024-02-01 MED ORDER — PROPOFOL 1000 MG/100ML IV EMUL: 0.0000 ug/kg/min | INTRAVENOUS | Status: DC

## 2024-02-01 MED ORDER — METHOCARBAMOL 1000 MG/10ML IJ SOLN: 500.0000 mg | Freq: Three times a day (TID) | INTRAMUSCULAR | Status: DC

## 2024-02-01 MED ORDER — ORAL CARE MOUTH RINSE
15.0000 mL | OROMUCOSAL | Status: DC
  Administered 2024-02-01 – 2024-02-09 (×90): 15 mL via OROMUCOSAL

## 2024-02-01 MED ORDER — ETOMIDATE 2 MG/ML IV SOLN
20.0000 mg | Freq: Once | INTRAVENOUS | Status: AC
  Administered 2024-02-01: 20 mg via INTRAVENOUS

## 2024-02-01 MED ORDER — FENTANYL BOLUS VIA INFUSION
25.0000 ug | INTRAVENOUS | Status: DC | PRN
  Administered 2024-02-01: 100 ug via INTRAVENOUS

## 2024-02-01 MED ORDER — HYDRALAZINE HCL 20 MG/ML IJ SOLN: 10.0000 mg | INTRAMUSCULAR | Status: DC | PRN

## 2024-02-01 MED ORDER — LEVETIRACETAM (KEPPRA) 500 MG/5 ML ADULT IV PUSH
60.0000 mg/kg | Freq: Once | INTRAVENOUS | Status: AC
  Administered 2024-02-01: 3750 mg via INTRAVENOUS

## 2024-02-01 MED ORDER — ACETAMINOPHEN 500 MG PO TABS
1000.0000 mg | ORAL_TABLET | Freq: Four times a day (QID) | ORAL | Status: DC
  Administered 2024-02-01 – 2024-02-06 (×18): 1000 mg
  Filled 2024-02-01 (×19): qty 2

## 2024-02-01 MED ORDER — LEVETIRACETAM (KEPPRA) 500 MG/5 ML ADULT IV PUSH
500.0000 mg | Freq: Two times a day (BID) | INTRAVENOUS | Status: AC
  Administered 2024-02-02 – 2024-02-08 (×14): 500 mg via INTRAVENOUS
  Filled 2024-02-01 (×14): qty 5

## 2024-02-01 MED ORDER — ONDANSETRON 4 MG PO TBDP: 4.0000 mg | ORAL_TABLET | Freq: Four times a day (QID) | ORAL | Status: DC | PRN

## 2024-02-01 MED ORDER — SUCCINYLCHOLINE CHLORIDE 200 MG/10ML IV SOSY
150.0000 mg | PREFILLED_SYRINGE | Freq: Once | INTRAVENOUS | Status: AC
  Administered 2024-02-01: 150 mg via INTRAVENOUS

## 2024-02-01 MED ORDER — FENTANYL CITRATE PF 50 MCG/ML IJ SOSY
25.0000 ug | PREFILLED_SYRINGE | Freq: Once | INTRAMUSCULAR | Status: AC
  Administered 2024-02-01: 50 ug via INTRAVENOUS

## 2024-02-01 MED ORDER — METOPROLOL TARTRATE 5 MG/5ML IV SOLN: 5.0000 mg | Freq: Four times a day (QID) | INTRAVENOUS | Status: DC | PRN

## 2024-02-01 MED ORDER — POLYETHYLENE GLYCOL 3350 17 G PO PACK: 17.0000 g | PACK | Freq: Every day | ORAL | Status: DC | PRN

## 2024-02-01 MED ORDER — ONDANSETRON HCL 4 MG/2ML IJ SOLN
4.0000 mg | Freq: Four times a day (QID) | INTRAMUSCULAR | Status: DC | PRN
  Administered 2024-02-03: 4 mg via INTRAVENOUS
  Filled 2024-02-01: qty 2

## 2024-02-01 MED ORDER — PANTOPRAZOLE SODIUM 40 MG PO TBEC: 40.0000 mg | DELAYED_RELEASE_TABLET | Freq: Every day | ORAL | Status: DC

## 2024-02-01 MED ORDER — METHOCARBAMOL 1000 MG/10ML IJ SOLN
500.0000 mg | Freq: Three times a day (TID) | INTRAMUSCULAR | Status: DC
  Administered 2024-02-01 – 2024-02-02 (×2): 500 mg via INTRAVENOUS
  Filled 2024-02-01 (×2): qty 10

## 2024-02-01 MED ORDER — CHLORHEXIDINE GLUCONATE CLOTH 2 % EX PADS
6.0000 | MEDICATED_PAD | Freq: Every day | CUTANEOUS | Status: DC
  Administered 2024-02-02 – 2024-02-03 (×2): 6 via TOPICAL

## 2024-02-01 MED ORDER — PANTOPRAZOLE SODIUM 40 MG IV SOLR
40.0000 mg | Freq: Every day | INTRAVENOUS | Status: DC
Start: 2024-02-01 — End: 2024-02-03
  Administered 2024-02-01 – 2024-02-03 (×3): 40 mg via INTRAVENOUS
  Filled 2024-02-01 (×3): qty 10

## 2024-02-01 MED ORDER — IOHEXOL 350 MG/ML SOLN
75.0000 mL | Freq: Once | INTRAVENOUS | Status: AC | PRN
Start: 2024-02-01 — End: 2024-02-01
  Administered 2024-02-01: 75 mL via INTRAVENOUS

## 2024-02-01 MED ORDER — DOCUSATE SODIUM 50 MG/5ML PO LIQD
100.0000 mg | Freq: Two times a day (BID) | ORAL | Status: DC
Start: 2024-02-01 — End: 2024-02-07
  Administered 2024-02-02 – 2024-02-05 (×8): 100 mg
  Filled 2024-02-01 (×9): qty 10

## 2024-02-01 MED ORDER — SODIUM CHLORIDE 0.9 % IV SOLN
INTRAVENOUS | Status: AC
Start: 2024-02-01 — End: 2024-02-02

## 2024-02-01 MED ORDER — POLYETHYLENE GLYCOL 3350 17 G PO PACK
17.0000 g | PACK | Freq: Every day | ORAL | Status: DC
Start: 2024-02-01 — End: 2024-02-06
  Administered 2024-02-02 – 2024-02-05 (×2): 17 g
  Filled 2024-02-01 (×3): qty 1

## 2024-02-01 NOTE — Progress Notes (Signed)
 Orthopedic Tech Progress Note Patient Details:  [name] 1951/02/18 [id number] Level 1 Trauma  Patient ID: [name], male   DOB: [birth year], 73 y.o.   MRN: [id number]  [name] 02/01/2024, 9:37 AM

## 2024-02-01 NOTE — Progress Notes (Signed)
 Patient seen and examined. CT shows bilateral frontal SDH. No surgical intervention at this time. Patient does flicker to pain, has a cough and gag and corneal on the right. Formal consult to follow

## 2024-02-01 NOTE — Progress Notes (Signed)
 Spoke to patients sister on the phone. She stated that the patient is more than likely still drinking, hence why he keeps having falls. She also stated that he does have children however the children have been estranged from him for some time. His girlfriend has stated to the sister that she can't take care of the patient anymore, she also has health issues of her own. Short term/long term solution for housing for the patient will need to be worked out depending on his outcome, possible SNF or other arrangements will need to be made.

## 2024-02-01 NOTE — ED Notes (Signed)
 Patient seizing, [name] Forte, MD notified.

## 2024-02-01 NOTE — ED Provider Notes (Signed)
 Emergency Department Provider Note   I have reviewed the triage vital signs and the nursing notes.   HISTORY  Chief Complaint Fall   HPI [name] is a 73 y.o. male with reported past medical history of alcohol  use.  Apparently was recently released from the hospital with multiple falls.  Unclear if he has started drinking again.  He apparently fell down 13 steps today heard by family who called 911.  He was noted to have a hematoma to the posterior scalp and had multiple seizures and route to the ED requiring Versed both IM and IV.  He has a nasopharyngeal airway and BVM respirations and round.   Level 5 caveat: unresponsive   History reviewed. No pertinent past medical history.  Review of Systems  Level 5 caveat: unresponsive   ____________________________________________   PHYSICAL EXAM:  VITAL SIGNS: ED Triage Vitals  Encounter Vitals Group     BP 02/01/24 0925 113/81     Pulse Rate 02/01/24 0925 79     Resp 02/01/24 0925 18     Temp 02/01/24 0929 (!) 95.7 F (35.4 C)     Temp Source 02/01/24 0929 Axillary     SpO2 02/01/24 0925 100 %     Weight 02/01/24 0929 136 lb 11 oz (62 kg)     Height 02/01/24 0929 5' 8 (1.727 m)    Constitutional: Unresponsive. Spontaneous respirations.  Eyes: Conjunctivae are normal. PERRL 6mm and sluggish. Head: Atraumatic. Nose: No congestion/rhinnorhea. Mouth/Throat: Mucous membranes are moist.   Neck: No stridor.  C collar in place.  Cardiovascular: Normal rate, regular rhythm. Good peripheral circulation. Grossly normal heart sounds.   Respiratory: Agonal respiratory effort.  No retractions. Lungs CTAB. Gastrointestinal: Soft. No distention.  Musculoskeletal: No gross deformities of extremities. Neurologic:  GCS 3. Spontaneous respirations. Skin:  Skin is warm, dry and intact. No rash noted.   ____________________________________________   LABS (all labs ordered are listed, but only abnormal results are  displayed)  Labs Reviewed  COMPREHENSIVE METABOLIC PANEL WITH GFR - Abnormal; Notable for the following components:      Result Value   Sodium 129 (*)    Chloride 96 (*)    Glucose, Bld 126 (*)    BUN 37 (*)    Creatinine, Ser 3.09 (*)    Calcium 8.3 (*)    Total Protein 5.6 (*)    Albumin 2.4 (*)    GFR, Estimated 21 (*)    All other components within normal limits  CBC - Abnormal; Notable for the following components:   RBC 2.85 (*)    Hemoglobin 8.6 (*)    HCT 27.2 (*)    RDW 15.9 (*)    All other components within normal limits  CBC - Abnormal; Notable for the following components:   RBC 2.78 (*)    Hemoglobin 8.4 (*)    HCT 26.0 (*)    RDW 16.5 (*)    All other components within normal limits  BASIC METABOLIC PANEL WITH GFR - Abnormal; Notable for the following components:   Sodium 133 (*)    CO2 18 (*)    BUN 32 (*)    Creatinine, Ser 2.79 (*)    Calcium 8.1 (*)    GFR, Estimated 24 (*)    All other components within normal limits  GLUCOSE, CAPILLARY - Abnormal; Notable for the following components:   Glucose-Capillary 111 (*)    All other components within normal limits  PHOSPHORUS - Abnormal; Notable for  the following components:   Phosphorus 4.7 (*)    All other components within normal limits  CBC - Abnormal; Notable for the following components:   WBC 11.5 (*)    RBC 3.19 (*)    Hemoglobin 10.0 (*)    HCT 30.9 (*)    RDW 16.6 (*)    All other components within normal limits  BASIC METABOLIC PANEL WITH GFR - Abnormal; Notable for the following components:   Sodium 134 (*)    CO2 21 (*)    Glucose, Bld 136 (*)    BUN 35 (*)    Creatinine, Ser 2.57 (*)    Calcium 8.5 (*)    GFR, Estimated 26 (*)    All other components within normal limits  PHOSPHORUS - Abnormal; Notable for the following components:   Phosphorus 5.0 (*)    All other components within normal limits  GLUCOSE, CAPILLARY - Abnormal; Notable for the following components:    Glucose-Capillary 128 (*)    All other components within normal limits  GLUCOSE, CAPILLARY - Abnormal; Notable for the following components:   Glucose-Capillary 141 (*)    All other components within normal limits  GLUCOSE, CAPILLARY - Abnormal; Notable for the following components:   Glucose-Capillary 127 (*)    All other components within normal limits  GLUCOSE, CAPILLARY - Abnormal; Notable for the following components:   Glucose-Capillary 122 (*)    All other components within normal limits  GLUCOSE, CAPILLARY - Abnormal; Notable for the following components:   Glucose-Capillary 113 (*)    All other components within normal limits  GLUCOSE, CAPILLARY - Abnormal; Notable for the following components:   Glucose-Capillary 118 (*)    All other components within normal limits  CBC - Abnormal; Notable for the following components:   RBC 2.69 (*)    Hemoglobin 8.4 (*)    HCT 26.2 (*)    RDW 17.0 (*)    All other components within normal limits  BASIC METABOLIC PANEL WITH GFR - Abnormal; Notable for the following components:   Sodium 131 (*)    CO2 21 (*)    Glucose, Bld 121 (*)    BUN 51 (*)    Creatinine, Ser 2.87 (*)    Calcium 8.2 (*)    GFR, Estimated 23 (*)    All other components within normal limits  GLUCOSE, CAPILLARY - Abnormal; Notable for the following components:   Glucose-Capillary 120 (*)    All other components within normal limits  GLUCOSE, CAPILLARY - Abnormal; Notable for the following components:   Glucose-Capillary 120 (*)    All other components within normal limits  GLUCOSE, CAPILLARY - Abnormal; Notable for the following components:   Glucose-Capillary 110 (*)    All other components within normal limits  GLUCOSE, CAPILLARY - Abnormal; Notable for the following components:   Glucose-Capillary 111 (*)    All other components within normal limits  GLUCOSE, CAPILLARY - Abnormal; Notable for the following components:   Glucose-Capillary 116 (*)    All  other components within normal limits  GLUCOSE, CAPILLARY - Abnormal; Notable for the following components:   Glucose-Capillary 106 (*)    All other components within normal limits  PHOSPHORUS - Abnormal; Notable for the following components:   Phosphorus 1.8 (*)    All other components within normal limits  GLUCOSE, CAPILLARY - Abnormal; Notable for the following components:   Glucose-Capillary 100 (*)    All other components within normal limits  GLUCOSE, CAPILLARY -  Abnormal; Notable for the following components:   Glucose-Capillary 118 (*)    All other components within normal limits  GLUCOSE, CAPILLARY - Abnormal; Notable for the following components:   Glucose-Capillary 120 (*)    All other components within normal limits  CBC - Abnormal; Notable for the following components:   RBC 2.96 (*)    Hemoglobin 8.9 (*)    HCT 28.4 (*)    RDW 16.4 (*)    All other components within normal limits  BASIC METABOLIC PANEL WITH GFR - Abnormal; Notable for the following components:   Glucose, Bld 123 (*)    BUN 53 (*)    Creatinine, Ser 2.39 (*)    Calcium 8.7 (*)    GFR, Estimated 28 (*)    All other components within normal limits  GLUCOSE, CAPILLARY - Abnormal; Notable for the following components:   Glucose-Capillary 117 (*)    All other components within normal limits  GLUCOSE, CAPILLARY - Abnormal; Notable for the following components:   Glucose-Capillary 123 (*)    All other components within normal limits  PHOSPHORUS - Abnormal; Notable for the following components:   Phosphorus 1.4 (*)    All other components within normal limits  GLUCOSE, CAPILLARY - Abnormal; Notable for the following components:   Glucose-Capillary 106 (*)    All other components within normal limits  GLUCOSE, CAPILLARY - Abnormal; Notable for the following components:   Glucose-Capillary 109 (*)    All other components within normal limits  GLUCOSE, CAPILLARY - Abnormal; Notable for the following  components:   Glucose-Capillary 126 (*)    All other components within normal limits  CBC - Abnormal; Notable for the following components:   RBC 3.30 (*)    Hemoglobin 10.0 (*)    HCT 31.8 (*)    RDW 16.3 (*)    Platelets 412 (*)    All other components within normal limits  COMPREHENSIVE METABOLIC PANEL WITH GFR - Abnormal; Notable for the following components:   Sodium 130 (*)    Glucose, Bld 123 (*)    BUN 61 (*)    Creatinine, Ser 2.00 (*)    Calcium 8.6 (*)    Albumin 2.3 (*)    Alkaline Phosphatase 147 (*)    GFR, Estimated 35 (*)    All other components within normal limits  PHOSPHORUS - Abnormal; Notable for the following components:   Phosphorus 2.2 (*)    All other components within normal limits  GLUCOSE, CAPILLARY - Abnormal; Notable for the following components:   Glucose-Capillary 138 (*)    All other components within normal limits  GLUCOSE, CAPILLARY - Abnormal; Notable for the following components:   Glucose-Capillary 124 (*)    All other components within normal limits  GLUCOSE, CAPILLARY - Abnormal; Notable for the following components:   Glucose-Capillary 109 (*)    All other components within normal limits  GLUCOSE, CAPILLARY - Abnormal; Notable for the following components:   Glucose-Capillary 114 (*)    All other components within normal limits  GLUCOSE, CAPILLARY - Abnormal; Notable for the following components:   Glucose-Capillary 105 (*)    All other components within normal limits  GLUCOSE, CAPILLARY - Abnormal; Notable for the following components:   Glucose-Capillary 105 (*)    All other components within normal limits  BASIC METABOLIC PANEL WITH GFR - Abnormal; Notable for the following components:   Sodium 133 (*)    Glucose, Bld 117 (*)    BUN 87 (*)  Creatinine, Ser 2.08 (*)    Calcium 8.4 (*)    GFR, Estimated 34 (*)    All other components within normal limits  CBC - Abnormal; Notable for the following components:   RBC 2.50 (*)     Hemoglobin 7.6 (*)    HCT 23.4 (*)    RDW 16.5 (*)    All other components within normal limits  GLUCOSE, CAPILLARY - Abnormal; Notable for the following components:   Glucose-Capillary 123 (*)    All other components within normal limits  TSH - Abnormal; Notable for the following components:   TSH 8.760 (*)    All other components within normal limits  GLUCOSE, CAPILLARY - Abnormal; Notable for the following components:   Glucose-Capillary 131 (*)    All other components within normal limits  PHOSPHORUS - Abnormal; Notable for the following components:   Phosphorus 2.2 (*)    All other components within normal limits  BASIC METABOLIC PANEL WITH GFR - Abnormal; Notable for the following components:   Sodium 134 (*)    Glucose, Bld 120 (*)    BUN 95 (*)    Creatinine, Ser 2.10 (*)    Calcium 8.4 (*)    GFR, Estimated 33 (*)    All other components within normal limits  CBC - Abnormal; Notable for the following components:   WBC 3.9 (*)    RBC 2.35 (*)    Hemoglobin 7.1 (*)    HCT 22.2 (*)    RDW 17.1 (*)    All other components within normal limits  GLUCOSE, CAPILLARY - Abnormal; Notable for the following components:   Glucose-Capillary 115 (*)    All other components within normal limits  GLUCOSE, CAPILLARY - Abnormal; Notable for the following components:   Glucose-Capillary 107 (*)    All other components within normal limits  GLUCOSE, CAPILLARY - Abnormal; Notable for the following components:   Glucose-Capillary 118 (*)    All other components within normal limits  GLUCOSE, CAPILLARY - Abnormal; Notable for the following components:   Glucose-Capillary 103 (*)    All other components within normal limits  T3, FREE - Abnormal; Notable for the following components:   T3, Free 1.1 (*)    All other components within normal limits  GLUCOSE, CAPILLARY - Abnormal; Notable for the following components:   Glucose-Capillary 107 (*)    All other components within normal  limits  BASIC METABOLIC PANEL WITH GFR - Abnormal; Notable for the following components:   Glucose, Bld 115 (*)    BUN 103 (*)    Creatinine, Ser 2.00 (*)    GFR, Estimated 35 (*)    All other components within normal limits  CBC - Abnormal; Notable for the following components:   RBC 2.37 (*)    Hemoglobin 7.2 (*)    HCT 22.3 (*)    RDW 17.0 (*)    Platelets 441 (*)    All other components within normal limits  GLUCOSE, CAPILLARY - Abnormal; Notable for the following components:   Glucose-Capillary 101 (*)    All other components within normal limits  GLUCOSE, CAPILLARY - Abnormal; Notable for the following components:   Glucose-Capillary 104 (*)    All other components within normal limits  GLUCOSE, CAPILLARY - Abnormal; Notable for the following components:   Glucose-Capillary 115 (*)    All other components within normal limits  GLUCOSE, CAPILLARY - Abnormal; Notable for the following components:   Glucose-Capillary 109 (*)  All other components within normal limits  BASIC METABOLIC PANEL WITH GFR - Abnormal; Notable for the following components:   Potassium 5.3 (*)    Glucose, Bld 118 (*)    BUN 105 (*)    Creatinine, Ser 1.92 (*)    GFR, Estimated 37 (*)    All other components within normal limits  CBC - Abnormal; Notable for the following components:   RBC 2.56 (*)    Hemoglobin 7.7 (*)    HCT 24.8 (*)    RDW 17.2 (*)    Platelets 517 (*)    All other components within normal limits  GLUCOSE, CAPILLARY - Abnormal; Notable for the following components:   Glucose-Capillary 101 (*)    All other components within normal limits  GLUCOSE, CAPILLARY - Abnormal; Notable for the following components:   Glucose-Capillary 108 (*)    All other components within normal limits  GLUCOSE, CAPILLARY - Abnormal; Notable for the following components:   Glucose-Capillary 105 (*)    All other components within normal limits  GLUCOSE, CAPILLARY - Abnormal; Notable for the  following components:   Glucose-Capillary 117 (*)    All other components within normal limits  BASIC METABOLIC PANEL WITH GFR - Abnormal; Notable for the following components:   Potassium 5.7 (*)    Glucose, Bld 145 (*)    BUN 105 (*)    Creatinine, Ser 1.86 (*)    GFR, Estimated 38 (*)    All other components within normal limits  GLUCOSE, CAPILLARY - Abnormal; Notable for the following components:   Glucose-Capillary 117 (*)    All other components within normal limits  GLUCOSE, CAPILLARY - Abnormal; Notable for the following components:   Glucose-Capillary 116 (*)    All other components within normal limits  BASIC METABOLIC PANEL WITH GFR - Abnormal; Notable for the following components:   Potassium 6.1 (*)    Glucose, Bld 117 (*)    BUN 106 (*)    Creatinine, Ser 1.89 (*)    GFR, Estimated 38 (*)    All other components within normal limits  CBC - Abnormal; Notable for the following components:   RBC 2.67 (*)    Hemoglobin 8.0 (*)    HCT 26.0 (*)    RDW 17.2 (*)    Platelets 575 (*)    All other components within normal limits  GLUCOSE, CAPILLARY - Abnormal; Notable for the following components:   Glucose-Capillary 112 (*)    All other components within normal limits  GLUCOSE, CAPILLARY - Abnormal; Notable for the following components:   Glucose-Capillary 126 (*)    All other components within normal limits  GLUCOSE, CAPILLARY - Abnormal; Notable for the following components:   Glucose-Capillary 123 (*)    All other components within normal limits  POTASSIUM - Abnormal; Notable for the following components:   Potassium 6.2 (*)    All other components within normal limits  GLUCOSE, CAPILLARY - Abnormal; Notable for the following components:   Glucose-Capillary 54 (*)    All other components within normal limits  GLUCOSE, CAPILLARY - Abnormal; Notable for the following components:   Glucose-Capillary 137 (*)    All other components within normal limits  BASIC  METABOLIC PANEL WITH GFR - Abnormal; Notable for the following components:   Potassium 5.8 (*)    Glucose, Bld 107 (*)    BUN 101 (*)    Creatinine, Ser 2.00 (*)    GFR, Estimated 35 (*)    All  other components within normal limits  CBC - Abnormal; Notable for the following components:   RBC 2.59 (*)    Hemoglobin 7.7 (*)    HCT 24.8 (*)    RDW 17.0 (*)    Platelets 535 (*)    All other components within normal limits  POTASSIUM - Abnormal; Notable for the following components:   Potassium 6.3 (*)    All other components within normal limits  POTASSIUM - Abnormal; Notable for the following components:   Potassium 5.6 (*)    All other components within normal limits  GLUCOSE, CAPILLARY - Abnormal; Notable for the following components:   Glucose-Capillary 250 (*)    All other components within normal limits  POTASSIUM - Abnormal; Notable for the following components:   Potassium 5.6 (*)    All other components within normal limits  GLUCOSE, CAPILLARY - Abnormal; Notable for the following components:   Glucose-Capillary 102 (*)    All other components within normal limits  POTASSIUM - Abnormal; Notable for the following components:   Potassium 6.0 (*)    All other components within normal limits  POTASSIUM - Abnormal; Notable for the following components:   Potassium 5.8 (*)    All other components within normal limits  POTASSIUM - Abnormal; Notable for the following components:   Potassium 5.6 (*)    All other components within normal limits  GLUCOSE, CAPILLARY - Abnormal; Notable for the following components:   Glucose-Capillary 111 (*)    All other components within normal limits  GLUCOSE, CAPILLARY - Abnormal; Notable for the following components:   Glucose-Capillary 107 (*)    All other components within normal limits  GLUCOSE, CAPILLARY - Abnormal; Notable for the following components:   Glucose-Capillary 103 (*)    All other components within normal limits  GLUCOSE,  CAPILLARY - Abnormal; Notable for the following components:   Glucose-Capillary 115 (*)    All other components within normal limits  POTASSIUM - Abnormal; Notable for the following components:   Potassium 5.4 (*)    All other components within normal limits  COMPREHENSIVE METABOLIC PANEL WITH GFR - Abnormal; Notable for the following components:   Potassium 5.6 (*)    Glucose, Bld 104 (*)    BUN 83 (*)    Creatinine, Ser 2.33 (*)    Total Protein 5.8 (*)    Albumin 2.1 (*)    Alkaline Phosphatase 133 (*)    GFR, Estimated 29 (*)    All other components within normal limits  CBC - Abnormal; Notable for the following components:   RBC 2.42 (*)    Hemoglobin 7.2 (*)    HCT 23.4 (*)    RDW 17.1 (*)    Platelets 611 (*)    All other components within normal limits  POTASSIUM - Abnormal; Notable for the following components:   Potassium 5.2 (*)    All other components within normal limits  GLUCOSE, CAPILLARY - Abnormal; Notable for the following components:   Glucose-Capillary 108 (*)    All other components within normal limits  GLUCOSE, CAPILLARY - Abnormal; Notable for the following components:   Glucose-Capillary 109 (*)    All other components within normal limits  GLUCOSE, CAPILLARY - Abnormal; Notable for the following components:   Glucose-Capillary 105 (*)    All other components within normal limits  RENAL FUNCTION PANEL - Abnormal; Notable for the following components:   CO2 21 (*)    Glucose, Bld 107 (*)  BUN 77 (*)    Creatinine, Ser 2.20 (*)    Calcium 8.8 (*)    Albumin 2.0 (*)    GFR, Estimated 31 (*)    All other components within normal limits  CBC - Abnormal; Notable for the following components:   RBC 2.29 (*)    Hemoglobin 6.8 (*)    HCT 22.4 (*)    RDW 17.2 (*)    Platelets 631 (*)    All other components within normal limits  GLUCOSE, CAPILLARY - Abnormal; Notable for the following components:   Glucose-Capillary 111 (*)    All other components  within normal limits  RENAL FUNCTION PANEL - Abnormal; Notable for the following components:   CO2 20 (*)    Glucose, Bld 110 (*)    BUN 66 (*)    Creatinine, Ser 2.00 (*)    Albumin 2.1 (*)    GFR, Estimated 35 (*)    All other components within normal limits  HEMOGLOBIN AND HEMATOCRIT, BLOOD - Abnormal; Notable for the following components:   Hemoglobin 8.5 (*)    HCT 26.6 (*)    All other components within normal limits  GLUCOSE, CAPILLARY - Abnormal; Notable for the following components:   Glucose-Capillary 108 (*)    All other components within normal limits  GLUCOSE, CAPILLARY - Abnormal; Notable for the following components:   Glucose-Capillary 102 (*)    All other components within normal limits  I-STAT CHEM 8, ED - Abnormal; Notable for the following components:   Sodium 131 (*)    Chloride 93 (*)    BUN 37 (*)    Creatinine, Ser 3.40 (*)    Glucose, Bld 123 (*)    Hemoglobin 8.8 (*)    HCT 26.0 (*)    All other components within normal limits  I-STAT ARTERIAL BLOOD GAS, ED - Abnormal; Notable for the following components:   pO2, Arterial 338 (*)    Sodium 131 (*)    HCT 26.0 (*)    Hemoglobin 8.8 (*)    All other components within normal limits  POCT I-STAT 7, (LYTES, BLD GAS, ICA,H+H) - Abnormal; Notable for the following components:   pH, Arterial 7.591 (*)    pCO2 arterial 24.2 (*)    pO2, Arterial 63 (*)    Sodium 127 (*)    HCT 26.0 (*)    Hemoglobin 8.8 (*)    All other components within normal limits  MRSA NEXT GEN BY PCR, NASAL  CDS SEROLOGY  ETHANOL  PROTIME-INR  HIV ANTIBODY (ROUTINE TESTING W REFLEX)  TRIGLYCERIDES  MAGNESIUM  MAGNESIUM  MAGNESIUM  PHOSPHORUS  MAGNESIUM  GLUCOSE, CAPILLARY  PHOSPHORUS  MAGNESIUM  T4, FREE  GLUCOSE, CAPILLARY  PHOSPHORUS  GLUCOSE, CAPILLARY  GLUCOSE, CAPILLARY  GLUCOSE, CAPILLARY  GLUCOSE, CAPILLARY  GLUCOSE, CAPILLARY  GLUCOSE, CAPILLARY  POTASSIUM  GLUCOSE, CAPILLARY  POTASSIUM  GLUCOSE,  CAPILLARY  POTASSIUM  GLUCOSE, CAPILLARY  POTASSIUM  GLUCOSE, CAPILLARY  GLUCOSE, CAPILLARY  GLUCOSE, CAPILLARY  GLUCOSE, CAPILLARY  GLUCOSE, CAPILLARY  GLUCOSE, CAPILLARY  GLUCOSE, CAPILLARY  GLUCOSE, CAPILLARY  GLUCOSE, CAPILLARY  GLUCOSE, CAPILLARY  RENAL FUNCTION PANEL  BASIC METABOLIC PANEL WITH GFR  SAMPLE TO BLOOD BANK  TYPE AND SCREEN  PREPARE RBC (CROSSMATCH)  ABO/RH     ____________________________________________   PROCEDURES  Procedure(s) performed:   Procedure Name: Intubation Date/Time: 02/01/2024 9:45 AM  Performed by: Roberts Ching, MDPre-anesthesia Checklist: Patient identified, Patient being monitored, Emergency Drugs available, Timeout performed and Suction available  Oxygen Delivery Method: Non-rebreather mask Preoxygenation: Pre-oxygenation with 100% oxygen Induction Type: Rapid sequence Ventilation: Mask ventilation without difficulty Laryngoscope Size: Glidescope Tube size: 7.5 mm Number of attempts: 1 Airway Equipment and Method: Video-laryngoscopy Placement Confirmation: ETT inserted through vocal cords under direct vision, CO2 detector and Breath sounds checked- equal and bilateral Secured at: 25 cm Tube secured with: ETT holder Dental Injury: Teeth and Oropharynx as per pre-operative assessment     .Critical Care  Performed by: Roberts Ching, MD Authorized by: Roberts Ching, MD   Critical care provider statement:    Critical care time (minutes):  35   Critical care time was exclusive of:  Separately billable procedures and treating other patients and teaching time   Critical care was necessary to treat or prevent imminent or life-threatening deterioration of the following conditions:  Trauma   Critical care was time spent personally by me on the following activities:  Blood draw for specimens, development of treatment plan with patient or surrogate, discussions with consultants, evaluation of patient's response to treatment,  examination of patient, obtaining history from patient or surrogate, ordering and performing treatments and interventions, ordering and review of laboratory studies, ordering and review of radiographic studies, pulse oximetry, re-evaluation of patient's condition and review of old charts   I assumed direction of critical care for this patient from another provider in my specialty: no     Care discussed with: admitting provider     FAST Exam: Limited Ultrasound of the abdomen and pericardium (FAST Exam).  Multiple views of the abdomen and pericardium are obtained with a multi-frequency probe.  EMERGENCY DEPARTMENT US  FAST EXAM  INDICATIONS:Blunt trauma to the thorax and Blunt injury of abdomen  PERFORMED BY: Myself  IMAGES ARCHIVED?: Yes  FINDINGS: All views negative  LIMITATIONS:  Emergent procedure  INTERPRETATION:  No abdominal free fluid and No pericardial effusion  CPT Codes: cardiac 16109-60, abdomen [phone] (study includes both codes)  ____________________________________________   INITIAL IMPRESSION / ASSESSMENT AND PLAN / ED COURSE  Pertinent labs & imaging results that were available during my care of the patient were reviewed by me and considered in my medical decision making (see chart for details).   This patient is Presenting for Evaluation of trauma/fall, which does require a range of treatment options, and is a complaint that involves a high risk of morbidity and mortality.  The Differential Diagnoses includes but is not exclusive to alcohol , illicit or prescription medications, intracranial pathology such as stroke, intracerebral hemorrhage, fever or infectious causes including sepsis, hypoxemia, uremia, trauma, endocrine related disorders such as diabetes, hypoglycemia, thyroid-related diseases, etc.   Critical Interventions-    Medications  ondansetron  (ZOFRAN -ODT) disintegrating tablet 4 mg ( Oral See Alternative 02/03/24 1223)    Or  ondansetron  (ZOFRAN )  injection 4 mg (4 mg Intravenous Given 02/03/24 1223)  hydrALAZINE  (APRESOLINE ) injection 10 mg (has no administration in time range)  0.9 %  sodium chloride  infusion (0 mLs Intravenous Stopped 02/02/24 1200)  methocarbamol  (ROBAXIN ) tablet 500 mg (500 mg Per Tube Given 02/04/24 0352)  LORazepam  (ATIVAN ) tablet 0-4 mg ( Per Tube Not Given 02/04/24 0754)    Followed by  LORazepam  (ATIVAN ) tablet 0-4 mg (2 mg Per Tube Given 02/06/24 0331)  tacrolimus  (PROGRAF ) capsule 0.5 mg (0.5 mg Sublingual Given 02/15/24 2106)  heparin  injection 5,000 Units (5,000 Units Subcutaneous Given 02/15/24 2105)  LORazepam  (ATIVAN ) tablet 1-4 mg ( Oral See Alternative 02/09/24 0533)    Or  LORazepam  (ATIVAN ) injection 1-4 mg (  1 mg Intravenous Given 02/09/24 0533)  oxyCODONE  (Oxy IR/ROXICODONE ) immediate release tablet 5-10 mg (10 mg Per Tube Given 02/15/24 1804)  ipratropium-albuterol  (DUONEB) 0.5-2.5 (3) MG/3ML nebulizer solution 3 mL (has no administration in time range)  acetaminophen  (TYLENOL ) tablet 1,000 mg (1,000 mg Per Tube Given 02/15/24 2317)  sodium chloride  flush (NS) 0.9 % injection 10-40 mL (has no administration in time range)  Oral care mouth rinse (15 mLs Mouth Rinse Given 02/15/24 2108)  Oral care mouth rinse (has no administration in time range)  metoprolol  tartrate (LOPRESSOR ) 25 mg/10 mL oral suspension 12.5 mg (12.5 mg Per Tube Given 02/15/24 2107)  docusate (COLACE) 50 MG/5ML liquid 100 mg (100 mg Per Tube Given 02/15/24 2105)  folic acid  (FOLVITE ) tablet 1 mg (1 mg Per Tube Given 02/15/24 0836)  levothyroxine  (SYNTHROID ) tablet 25 mcg (25 mcg Per Tube Given 02/15/24 0503)  multivitamin with minerals tablet 1 tablet (1 tablet Per Tube Given 02/15/24 0836)  polyethylene glycol (MIRALAX  / GLYCOLAX ) packet 17 g (17 g Per Tube Patient Refused/Not Given 02/15/24 0835)  thiamine  (VITAMIN B1) tablet 100 mg (100 mg Per Tube Given 02/15/24 0836)  dextrose  5 % and 0.45 % NaCl infusion (0 mLs Intravenous Stopped 02/13/24 0918)   LORazepam  (ATIVAN ) tablet 1-4 mg (has no administration in time range)    Or  LORazepam  (ATIVAN ) injection 1-4 mg (has no administration in time range)  0.9 %  sodium chloride  infusion (0 mLs Intravenous Stopped 02/14/24 1148)  (feeding supplement) PROSource Plus liquid 30 mL (30 mLs Oral Patient Refused/Not Given 02/15/24 1528)  lactulose (CHRONULAC) 10 GM/15ML solution 30 g (30 g Oral Given 02/14/24 2143)  feeding supplement (NEPRO CARB STEADY) liquid 1,000 mL (1,000 mLs Per Tube Given 02/15/24 1849)  feeding supplement (PROSource TF20) liquid 60 mL (60 mLs Per Tube Given 02/15/24 0835)  free water  150 mL (150 mLs Per Tube Given 02/15/24 2317)  HYDROmorphone (DILAUDID) injection 0.5 mg (has no administration in time range)  ascorbic acid (VITAMIN C) tablet 500 mg (500 mg Oral Given 02/15/24 2109)  0.9 %  sodium chloride  infusion (Manually program via Guardrails IV Fluids) (has no administration in time range)  ferrous sulfate tablet 325 mg (325 mg Oral Given 02/15/24 1804)  0.9 %  sodium chloride  infusion (0 mLs Intravenous Stopped 02/15/24 0837)  sodium chloride  flush (NS) 0.9 % injection 10-40 mL (has no administration in time range)  feeding supplement (BOOST / RESOURCE BREEZE) liquid 1 Container (1 Container Oral Patient Refused/Not Given 02/15/24 2107)  fentaNYL  (SUBLIMAZE ) injection 25-50 mcg (50 mcg Intravenous Given 02/01/24 1009)  iohexol  (OMNIPAQUE ) 350 MG/ML injection 75 mL (75 mLs Intravenous Contrast Given 02/01/24 0948)  levETIRAcetam  (KEPPRA ) undiluted injection 3,750 mg (3,750 mg Intravenous Given 02/01/24 1005)  levETIRAcetam  (KEPPRA ) undiluted injection 500 mg (500 mg Intravenous Given 02/08/24 1030)  succinylcholine  (ANECTINE ) syringe 150 mg (150 mg Intravenous Given 02/01/24 0923)  etomidate  (AMIDATE ) injection 20 mg (20 mg Intravenous Given 02/01/24 0922)  metoprolol  tartrate (LOPRESSOR ) injection 5 mg (5 mg Intravenous Given 02/09/24 0734)  sterile water  (preservative free) injection  (0.5 mLs  Given 02/05/24 1552)  metoprolol  tartrate (LOPRESSOR ) injection 5 mg (5 mg Intravenous Given 02/05/24 1636)  metoprolol  tartrate (LOPRESSOR ) injection 5 mg (5 mg Intravenous Given 02/06/24 0806)  sodium phosphate 45 mmol in sodium chloride  0.9 % 250 mL infusion (0 mmol Intravenous Stopped 02/06/24 2033)  sodium zirconium cyclosilicate  (LOKELMA ) packet 10 g (10 g Per Tube Given 02/06/24 1431)  amiodarone  (NEXTERONE ) 1.5 mg/mL IV bolus  only 150 mg (0 mg Intravenous Stopped 02/07/24 1034)  amiodarone  (NEXTERONE ) 1.8 mg/mL load via infusion 150 mg (150 mg Intravenous Bolus from Bag 02/07/24 1351)  sodium phosphate 15 mmol in sodium chloride  0.9 % 250 mL infusion ( Intravenous Stopped 02/08/24 1842)  furosemide  (LASIX ) injection 20 mg (20 mg Intravenous Given 02/10/24 1711)  sodium zirconium cyclosilicate  (LOKELMA ) packet 10 g (10 g Per Tube Given 02/11/24 1859)  insulin aspart (novoLOG) injection 10 Units (10 Units Intravenous Given 02/11/24 1306)    And  dextrose  50 % solution 50 mL (50 mLs Intravenous Given 02/11/24 1306)  dextrose  50 % solution 12.5 g (12.5 g Intravenous Given 02/11/24 1526)  albuterol  (PROVENTIL ) (2.5 MG/3ML) 0.083% nebulizer solution 10 mg (10 mg Nebulization Given 02/11/24 1838)  sodium chloride  0.9 % bolus 500 mL (0 mLs Intravenous Stopping previously hung infusion 02/12/24 0710)  insulin aspart (novoLOG) injection 5 Units (5 Units Intravenous Given 02/11/24 1856)    And  dextrose  50 % solution 50 mL (50 mLs Intravenous Given 02/11/24 1857)  sodium zirconium cyclosilicate  (LOKELMA ) packet 10 g (10 g Oral Given 02/12/24 2153)  sodium zirconium cyclosilicate  (LOKELMA ) packet 10 g (10 g Oral Given 02/13/24 1054)    Reassessment after intervention: intubated without difficulty.     I did obtain Additional Historical Information from EMS, as the patient is unresponsive.   Clinical Laboratory Tests Ordered, included CXR, pelvis XR, CT head.   Radiologic Tests Ordered, included CXR and pelvis XR. I  independently interpreted the images and agree with radiology interpretation.   Cardiac Monitor Tracing which shows NSR.    Social Determinants of Health Risk patient with EtOH abuse history.   Consult complete with Trauma surgery who evaluated patient in conjunction with our team upon arrival.   Medical Decision Making: Summary:  The patient presents to the emergency department for evaluation of altered mental status and seizure after fall.  Apparently fell down 13 steps.  Patient was intubated upon arrival without difficulty as above.  Fast negative.  Patient taken to CT with trauma surgery.  Ordered propofol  along with fentanyl /Versed for sedation.  Unclear if head injury is causing his seizure versus alcohol  withdrawal.  No records immediately available for review.  Chart is marked for merge at this time.   Reevaluation with update patient tolerating vent well. Admit to trauma.   Patient's presentation is most consistent with acute presentation with potential threat to life or bodily function.   Disposition: admit  ____________________________________________  FINAL CLINICAL IMPRESSION(S) / ED DIAGNOSES  Final diagnoses:  Fall, initial encounter  Seizure [facility])    Note:  This document was prepared using Dragon voice recognition software and may include unintentional dictation errors.  Abby Hocking, MD, [facility] Medicine    Solina Heron, Shereen Dike, MD 02/15/24 [phone]

## 2024-02-01 NOTE — Progress Notes (Signed)
 Chaplain responded to page to support pt that fail down 14 steps. Chaplain available as needed.  Anton Baton, [place], [facility], Pager  [phone]

## 2024-02-01 NOTE — Consult Note (Signed)
 Reason for Consult:SDH Referring Physician: Trauma  [name] is an 73 y.o. male.   HPI:  73 year old male presented to the ED today after sustaining a fall down stairs. He has had quite a few falls recently. Has a history of alcoholism. Was intubated in the ED for airway protection. Currently on propofol  and fentanyl . No family at bedside.   History reviewed. No pertinent past medical history.  History reviewed. No pertinent surgical history.  Allergies  Allergen Reactions   Tape Other (See Comments)    Skin tears    Zolpidem Other (See Comments)    confusion    Social History   Tobacco Use   Smoking status: Not on file   Smokeless tobacco: Not on file  Substance Use Topics   Alcohol  use: Not on file    History reviewed. No pertinent family history.   Review of Systems  Positive ROS: as above  All other systems have been reviewed and were otherwise negative with the exception of those mentioned in the HPI and as above.  Objective: Vital signs in last 24 hours: Temp:  [95.7 F (35.4 C)-97 F (36.1 C)] 97 F (36.1 C) (05/28 1200) Pulse Rate:  [75-82] 78 (05/28 1200) Resp:  [16-20] 20 (05/28 1200) BP: (113-148)/(80-85) 132/83 (05/28 1200) SpO2:  [96 %-100 %] 99 % (05/28 1200) FiO2 (%):  [40 %-100 %] 40 % (05/28 1200) Weight:  [62 kg] 62 kg (05/28 0929)  General Appearance: obtunded and intubated Head: Normocephalic, without obvious abnormality, atraumatic Eyes: PERRL, conjunctiva/corneas clear, EOM's intact, fundi benign, both eyes      Throat: ETT Lungs:  respirations unlabored Heart: Regular rate and rhythm Extremities: Extremities normal, atraumatic, no cyanosis or edema Pulses: 2+ and symmetric all extremities Skin: Skin color, texture, turgor normal, no rashes or lesions  NEUROLOGIC:   Mental status: obtunded Motor Exam - flicker to noxious stimuli in upper extremities Sensory Exam - UTA Reflexes: symmetric, no pathologic reflexes, No Hoffman's,  No clonus Coordination - UTA Gait - not tested Balance - not tested Cranial Nerves: I: smell Not tested  II: visual acuity  OS: na    OD: na  II: visual fields   II: pupils Equal, round, reactive to light  III,VII: ptosis   III,IV,VI: extraocular muscles    V: mastication   V: facial light touch sensation    V,VII: corneal reflex    VII: facial muscle function - upper    VII: facial muscle function - lower   VIII: hearing   IX: soft palate elevation    IX,X: gag reflex   XI: trapezius strength    XI: sternocleidomastoid strength   XI: neck flexion strength    XII: tongue strength      Data Review Lab Results  Component Value Date   WBC 5.9 02/01/2024   HGB 8.8 (L) 02/01/2024   HCT 26.0 (L) 02/01/2024   MCV 95.4 02/01/2024   PLT 232 02/01/2024   Lab Results  Component Value Date   NA 131 (L) 02/01/2024   K 4.0 02/01/2024   CL 93 (L) 02/01/2024   CO2 25 02/01/2024   BUN 37 (H) 02/01/2024   CREATININE 3.40 (H) 02/01/2024   GLUCOSE 123 (H) 02/01/2024   Lab Results  Component Value Date   INR 1.1 02/01/2024    Radiology: CT CHEST ABDOMEN PELVIS W CONTRAST Result Date: 02/01/2024 CLINICAL DATA:  Patient fell down stairs.  Seizure. EXAM: CT CHEST, ABDOMEN, AND PELVIS WITH CONTRAST TECHNIQUE:  Multidetector CT imaging of the chest, abdomen and pelvis was performed following the standard protocol during bolus administration of intravenous contrast. RADIATION DOSE REDUCTION: This exam was performed according to the departmental dose-optimization program which includes automated exposure control, adjustment of the mA and/or kV according to patient size and/or use of iterative reconstruction technique. CONTRAST:  75mL OMNIPAQUE  IOHEXOL  350 MG/ML SOLN COMPARISON:  01/21/2024 FINDINGS: CT CHEST FINDINGS Cardiovascular: Heart is enlarged. No substantial pericardial effusion. Coronary artery calcification is evident. Mild atherosclerotic calcification is noted in the wall of the  thoracic aorta. Mediastinum/Nodes: No mediastinal lymphadenopathy. There is no hilar lymphadenopathy. The esophagus has normal imaging features. There is no axillary lymphadenopathy. Lungs/Pleura: Centrilobular and paraseptal emphysema evident. No pneumothorax. Endotracheal tube tip is in the midtrachea. Dependent collapse/consolidation noted in both lower lungs with peripheral airway impaction in the posterior right lung base. Musculoskeletal: Multiple acute and nonacute right-sided rib fractures evident. Acute displaced rib fractures are seen in the lateral right third through eighth ribs and in the posterior right fourth through tenth ribs, makingthe right fourth through eighth rib fractures segmental. Multiple healed left-sided rib fractures evident. Old sternal fracture noted. Superior endplate compression deformity at T3 is stable since prior. Mild superior endplate compression deformity at T4 and T5 is also unchanged since previous. CT ABDOMEN PELVIS FINDINGS Hepatobiliary: No suspicious focal abnormality within the liver parenchyma. Pneumobilia again noted. Cholecystectomy. No intrahepatic or extrahepatic biliary dilation. Pancreas: No focal mass lesion. No dilatation of the main duct. No intraparenchymal cyst. No peripancreatic edema. Spleen: No splenomegaly. No suspicious focal mass lesion. Adrenals/Urinary Tract: No adrenal nodule or mass. Tiny well-defined homogeneous low-density lesions in both kidneys are too small to characterize but are statistically most likely benign and probably cysts. No followup imaging is recommended. No evidence for hydroureter. The urinary bladder appears normal for the degree of distention. Stomach/Bowel: NG tube tip is in the stomach. 2.4 x 2.3 cm collection of fluid and gas is seen adjacent to the pyloric region of the stomach (image 63/3 and sagittal 60/7. This is associated with a gas collection anterior to the antrum of the stomach (delayed image 24/8). 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 Presumably excluded portion of the stomach is distended. Duodenum is normally positioned as is the ligament of Treitz. No small bowel dilatation. The terminal ileum is normal. The appendix is normal. Contrast material in the colon likely residual from the study from 8 days ago. Diverticular changes are noted in the sigmoid colon. Vascular/Lymphatic: There is moderate atherosclerotic calcification of the abdominal aorta without aneurysm. There is no gastrohepatic or hepatoduodenal ligament lymphadenopathy. No retroperitoneal or mesenteric lymphadenopathy. No pelvic sidewall lymphadenopathy. Reproductive: The prostate gland and seminal vesicles are unremarkable. Other: No free fluid around the liver. There may be some trace fluid adjacent to the posterior spleen (image 54/3) no free fluid is seen in the pelvis. Musculoskeletal: L1 compression deformity is similar to prior old fractures noted right superior and inferior pubic rami and in the right sacrum. IMPRESSION: 1. Multiple acute/subacute right-sided and nonacute bilateral rib fractures as seen on previous study from 01/21/2024. Acute/Subacute displaced rib fractures are seen in the lateral right third through eighth ribs and in the posterior right fourth through tenth ribs,  making the right fourth through eighth rib fractures segmental. No pneumothorax. 2. Dependent collapse/consolidation in both lower lungs with peripheral airway impaction in the posterior right lung base. Imaging features could be related to atelectasis or aspiration. 3. Superior endplate compression deformities at T3, T4, and T5 are stable since 01/21/2024. L1 compression deformity is  also chronic and unchanged. Old right pelvic fractures again noted. 4. No acute traumatic injury identified in the abdomen or pelvis. There may be trace fluid adjacent to the posterior spleen, but this is similar to the study from 1 week ago. No free fluid in the para colic gutters or pelvis. 5. Status post gastric bypass surgery. 6. Sigmoid diverticulosis without diverticulitis. 7. Aortic Atherosclerosis (ICD10-I70.0) and Emphysema (ICD10-J43.9). Electronically Signed   By: Donnal Fusi M.D.   On: 02/01/2024 10:16   CT Head Wo Contrast Result Date: 02/01/2024 CLINICAL DATA:  73 year old male status post fall down multiple stairs. EXAM: CT HEAD WITHOUT CONTRAST TECHNIQUE: Contiguous axial images were obtained from the base of the skull through the vertex without intravenous contrast. RADIATION DOSE REDUCTION: This exam was performed according to the departmental dose-optimization program which includes automated exposure control, adjustment of the mA and/or kV according to patient size and/or use of iterative reconstruction technique. COMPARISON:  Head CT 01/21/2024. FINDINGS: Brain: Bilateral anterior frontal convexity subarachnoid hemorrhage, moderate volume (series 4, image 27). Superimposed right side anterior convexity and para falcine subdural hematoma measures 4-5 mm in thickness. Contralateral lower density left side subdural hematoma which is most visible at the vertex, 5 mm thickness there (coronal image 33). Probable bifrontal hemorrhagic contusions, although not discrete at this time. Smaller volume anterior temporal, middle cranial fossa hyperdense blood also which is probably combined subarachnoid and subdural. No intraventricular hemorrhage. No ventriculomegaly. No significant midline shift (coronal image 45). No significant ventricular mass effect. Basilar cisterns appear stable and normal. Stable gray-white differentiation elsewhere. No cortically based acute infarct identified. Vascular:  Calcified atherosclerosis at the skull base. No suspicious intracranial vascular hyperdensity. Skull: Nondepressed midline occipital protuberance fracture tracking into the posterior foramen magnum on series 3, image 11. The fracture does not appear to extend above the occipital protuberance. Severely degenerated C1-C2, craniocervical junction and the visible C1 and C2 vertebrae appear stable compared to 01/21/2024 cervical spine CT. No other skull fracture identified. Sinuses/Orbits: Layering intermediate density fluid in the sphenoid sinuses. Intubated. Tympanic cavities and mastoids appear stable and well aerated. Other: Visualized orbit soft tissues are within normal limits. Partially visible endotracheal tube and oral enteric tube. Broad-based left occipital, suboccipital scalp hematoma. Underlying non depressed fracture through the midline occipital protuberance. Traumatic Brain Injury Risk Stratification Skull Fracture: Nondisplaced - Moderate/mBIG 2 Subdural Hematoma (SDH): 4mm to <60mm - mBIG 2.  However, bilateral. Subarachnoid Hemorrhage [facility]): multifocal, bilateral - High/mBIG 3 Epidural Hematoma (EDH): No - Low/mBIG 1 Cerebral contusion, intra-axial, intraparenchymal Hemorrhage (IPH): Suspected but occult at this time. Intraventricular Hemorrhage (IVH): No - Low/mBIG 1 Midline Shift > 1mm or Edema/effacement of sulci/vents: No - Low/mBIG 1 ---------------------------------------------------- IMPRESSION: 1. Positive for multifocal acute posttraumatic intracranial hemorrhage: - Bilateral SDH (5 mm each side). - Bilateral anterior convexity SAH (moderate), likely with bifrontal hemorrhagic contusions being obscured at this time. 2. Nondepressed midline occipital protuberance fracture tracking into the posterior foramen magnum. Overlying scalp hematoma. 3. No significant intracranial mass effect or midline shift. No ventriculomegaly or intraventricular basilar cisterns appear normal. Study discussed by  telephone with Dr. Dorena Gander on 02/01/2024 at 09:59 . Electronically Signed  By: Marlise Simpers M.D.   On: 02/01/2024 10:01   DG Pelvis Portable Result Date: 02/01/2024 CLINICAL DATA:  Trauma, fell downstairs EXAM: PORTABLE PELVIS 1-2 VIEWS COMPARISON:  01/21/2024 FINDINGS: Supine frontal view of the pelvis includes both hips. Stable subacute fractures involving the right superior and inferior pubic rami, left parasymphyseal region, and right sacral ala. I do not see any new acute displaced fractures. Stable symmetrical bilateral hip osteoarthritis and lower lumbar degenerative changes. IMPRESSION: 1. Stable subacute fractures involving the right sacral ala, right superior and inferior rami, and left parasymphyseal region as above. 2. No acute bony abnormalities. Electronically Signed   By: Bobbye Burrow M.D.   On: 02/01/2024 09:52   DG Chest Portable 1 View Result Date: 02/01/2024 CLINICAL DATA:  Trauma, fell downstairs EXAM: PORTABLE CHEST 1 VIEW COMPARISON:  01/24/2024 FINDINGS: Supine frontal view of the chest was obtained, excluding the apices and left lateral hemithorax by collimation. Endotracheal tube overlies tracheal air column, tip 4.5 cm above carina. Enteric catheter passes below diaphragm, tip excluded by collimation. Cardiac silhouette is unremarkable. There are displaced segmental right third through eighth rib fractures again noted, with small right pleural effusion slightly increased since prior study. No evidence of pneumothorax on this supine exam. Likely nondisplaced left posterior fifth rib fracture. Otherwise the left chest is clear. IMPRESSION: 1. Limited evaluation due to patient positioning. 2. Support devices as above. 3. Segmental right lateral third through eighth rib fractures unchanged since prior exam, with small right pleural effusion slightly increased since prior exam. No evidence of pneumothorax on this supine exam. 4. Suspected nondisplaced left posterior fifth rib fracture,  new since prior exam. Electronically Signed   By: Bobbye Burrow M.D.   On: 02/01/2024 09:50     Assessment/Plan: 73 year old male presented with frequent falls. He was found at the bottom of his stairs today. CT head shows bilateral frontal SDH and a nondepressed occipital skull fracture. No midline shift or mass effect. Trauma to admit. There is no role for surgery at this time. Recommend repeat head CT in the morning.    [name] 02/01/2024 1:00 PM

## 2024-02-01 NOTE — ED Notes (Signed)
 Trauma Response Nurse Documentation   [name] is a 73 y.o. male arriving to [name] via [facility]  On No antithrombotic. Trauma was activated as a Level 1 by [name] , RN - Charge nurse based on the following trauma criteria GCS < 9.  Patient cleared for CT by Dr. Hildy [name]. Pt transported to CT with trauma response nurse present to monitor. RN remained with the patient throughout their absence from the department for clinical observation.   GCS 3.  Trauma MD Arrival Time: 7253. -- Dr. Hildy [name]   History   See merged chart-    Liver transplant ETOH abuse - unsure if actively drinking     Initial Focused Assessment (If applicable, or please see trauma documentation): See EDP assessment  CT's Completed:   CT Head, CT C-Spine, CT Chest w/ contrast, and CT abdomen/pelvis w/ contrast   Interventions:   Labs Xrays CT scans NSG consult Admit to ICU Keppra   Plan for disposition:  Admission to ICU   Consults completed:  Neurosurgeon at 1001 paged Dr. Jones/Arrived 1020.  Event Summary: [name] down 14 steps, was unresponsive on EMS arrival, weized enroute, received Versed - totoal of 10mg  enroute- on arrival EMS holding blow by O2 with BVM, pt is unresponsive, Pupils - 6 and sluggish.  IV per EMS infiltrated when first dose of Etomidate  was pushed. IV 18 G started in left AC. RSI  meds given. Pt intubated per Dr. Dolan [name]. OG placed- verified by auscultation and xray.   2nd IV started per Paige, RN in right forearm with 20g.   Pt taken to CT and back with RT, primary RN , TMD -C-spine maintained with all moves.   C-collar removed per VO Dr. Hildy [name] after CT scans.     [name]  Trauma Response RN  Please call TRN at [phone] for further assistance.

## 2024-02-01 NOTE — Progress Notes (Signed)
 Patient transported from ED to 4N30 without complications. RN at bedside.

## 2024-02-01 NOTE — Progress Notes (Signed)
 Patient intubated, then took to CT and back to trauma A without complications. RN at bedside.

## 2024-02-01 NOTE — Progress Notes (Signed)
 Patient actually doing better. He localizes very well and attempts to speak on the ventilator, attempts to open eyes. No need for an intracranial monitor right now

## 2024-02-01 NOTE — H&P (Signed)
 [name] is an 73 y.o. male.   Chief Complaint: Fall downstairs, unconscious HPI: 73 year old male with history of recent fall with rib fractures was found by family at the bottom of the stairs after another suspected fall.  He was seizing.  EMS was called and transported him as a level 1 trauma.  He got 15 mg of midazolam total en route.  On arrival, GCS 3.  He was intubated expeditiously by the emergency department physician.  No other history initially available.  No past medical history on file.   No family history on file. Social History:  has no history on file for tobacco use, alcohol  use, and drug use.  Allergies:  Allergies  Allergen Reactions   Tape Other (See Comments)    Skin tears    Zolpidem Other (See Comments)    confusion    (Not in a hospital admission)   Results for orders placed or performed during the hospital encounter of 02/01/24 (from the past 48 hours)  CDS serology     Status: None   Collection Time: 02/01/24 10:00 AM  Result Value Ref Range   CDS serology specimen      SPECIMEN WILL BE HELD FOR 14 DAYS IF TESTING IS REQUIRED    Comment: Performed at [facility], [address] [place], [place] [address]  CBC     Status: Abnormal   Collection Time: 02/01/24 10:00 AM  Result Value Ref Range   WBC 5.9 4.0 - 10.5 K/uL   RBC 2.85 (L) 4.22 - 5.81 MIL/uL   Hemoglobin 8.6 (L) 13.0 - 17.0 g/dL   HCT 69.6 (L) 78.9 - 38.1 %   MCV 95.4 80.0 - 100.0 fL   MCH 30.2 26.0 - 34.0 pg   MCHC 31.6 30.0 - 36.0 g/dL   RDW 01.7 (H) 51.0 - 25.8 %   Platelets 232 150 - 400 K/uL   nRBC 0.0 0.0 - 0.2 %    Comment: Performed at [facility], [address] [place], [place] [address]  Sample to Blood Bank     Status: None   Collection Time: 02/01/24 10:00 AM  Result Value Ref Range   Blood Bank Specimen SAMPLE AVAILABLE FOR TESTING    Sample Expiration      02/04/2024,2359 Performed at [facility], [address], [place], [place]  [address]   I-Stat Chem 8, ED ([facility] only)     Status: Abnormal   Collection Time: 02/01/24 10:04 AM  Result Value Ref Range   Sodium 131 (L) 135 - 145 mmol/L   Potassium 4.3 3.5 - 5.1 mmol/L   Chloride 93 (L) 98 - 111 mmol/L   BUN 37 (H) 8 - 23 mg/dL   Creatinine, Ser 3.61 (H) 0.61 - 1.24 mg/dL   Glucose, Bld 443 (H) 70 - 99 mg/dL    Comment: Glucose reference range applies only to samples taken after fasting for at least 8 hours.   Calcium, Ion 1.15 1.15 - 1.40 mmol/L   TCO2 25 22 - 32 mmol/L   Hemoglobin 8.8 (L) 13.0 - 17.0 g/dL   HCT 15.4 (L) 00.8 - 67.6 %  I-Stat arterial blood gas, ED     Status: Abnormal   Collection Time: 02/01/24 10:09 AM  Result Value Ref Range   pH, Arterial 7.425 7.35 - 7.45   pCO2 arterial 40.8 32 - 48 mmHg   pO2, Arterial 338 (H) 83 - 108 mmHg   Bicarbonate 26.9 20.0 -  28.0 mmol/L   TCO2 28 22 - 32 mmol/L   O2 Saturation 100 %   Acid-Base Excess 2.0 0.0 - 2.0 mmol/L   Sodium 131 (L) 135 - 145 mmol/L   Potassium 4.0 3.5 - 5.1 mmol/L   Calcium, Ion 1.21 1.15 - 1.40 mmol/L   HCT 26.0 (L) 39.0 - 52.0 %   Hemoglobin 8.8 (L) 13.0 - 17.0 g/dL   Patient temperature 16.1 F    Collection site RADIAL, ALLEN'S TEST ACCEPTABLE    Drawn by RT    Sample type ARTERIAL    CT CHEST ABDOMEN PELVIS W CONTRAST Result Date: 02/01/2024 CLINICAL DATA:  Patient fell down stairs.  Seizure. EXAM: CT CHEST, ABDOMEN, AND PELVIS WITH CONTRAST TECHNIQUE: Multidetector CT imaging of the chest, abdomen and pelvis was performed following the standard protocol during bolus administration of intravenous contrast. RADIATION DOSE REDUCTION: This exam was performed according to the departmental dose-optimization program which includes automated exposure control, adjustment of the mA and/or kV according to patient size and/or use of iterative reconstruction technique. CONTRAST:  75mL OMNIPAQUE  IOHEXOL  350 MG/ML SOLN COMPARISON:  01/21/2024 FINDINGS: CT CHEST FINDINGS Cardiovascular: Heart is  enlarged. No substantial pericardial effusion. Coronary artery calcification is evident. Mild atherosclerotic calcification is noted in the wall of the thoracic aorta. Mediastinum/Nodes: No mediastinal lymphadenopathy. There is no hilar lymphadenopathy. The esophagus has normal imaging features. There is no axillary lymphadenopathy. Lungs/Pleura: Centrilobular and paraseptal emphysema evident. No pneumothorax. Endotracheal tube tip is in the midtrachea. Dependent collapse/consolidation noted in both lower lungs with peripheral airway impaction in the posterior right lung base. Musculoskeletal: Multiple acute and nonacute right-sided rib fractures evident. Acute displaced rib fractures are seen in the lateral right third through eighth ribs and in the posterior right fourth through tenth ribs, makingthe right fourth through eighth rib fractures segmental. Multiple healed left-sided rib fractures evident. Old sternal fracture noted. Superior endplate compression deformity at T3 is stable since prior. Mild superior endplate compression deformity at T4 and T5 is also unchanged since previous. CT ABDOMEN PELVIS FINDINGS Hepatobiliary: No suspicious focal abnormality within the liver parenchyma. Pneumobilia again noted. Cholecystectomy. No intrahepatic or extrahepatic biliary dilation. Pancreas: No focal mass lesion. No dilatation of the main duct. No intraparenchymal cyst. No peripancreatic edema. Spleen: No splenomegaly. No suspicious focal mass lesion. Adrenals/Urinary Tract: No adrenal nodule or mass. Tiny well-defined homogeneous low-density lesions in both kidneys are too small to characterize but are statistically most likely benign and probably cysts. No followup imaging is recommended. No evidence for hydroureter. The urinary bladder appears normal for the degree of distention. Stomach/Bowel: NG tube tip is in the stomach. 2.4 x 2.3 cm collection of fluid and gas is seen adjacent to the pyloric region of the  stomach (image 63/3 and sagittal 60/7. This is associated with a gas collection anterior to the antrum of the stomach (delayed image 24/8). 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 Presumably excluded portion of the stomach is distended. Duodenum is normally positioned as is the ligament of Treitz. No small bowel dilatation. The terminal ileum is normal. The appendix is normal. Contrast material in the colon likely residual from the study from 8 days ago. Diverticular changes are noted in the sigmoid colon. Vascular/Lymphatic: There  is moderate atherosclerotic calcification of the abdominal aorta without aneurysm. There is no gastrohepatic or hepatoduodenal ligament lymphadenopathy. No retroperitoneal or mesenteric lymphadenopathy. No pelvic sidewall lymphadenopathy. Reproductive: The prostate gland and seminal vesicles are unremarkable. Other: No free fluid around the liver. There may be some trace fluid adjacent to the posterior spleen (image 54/3) no free fluid is seen in the pelvis. Musculoskeletal: L1 compression deformity is similar to prior old fractures noted right superior and inferior pubic rami and in the right sacrum. IMPRESSION: 1. Multiple acute/subacute right-sided and nonacute bilateral rib fractures as seen on previous study from 01/21/2024. Acute/Subacute displaced rib fractures are seen in the lateral right third through eighth ribs and in the posterior right fourth through tenth ribs, making the right fourth through eighth rib fractures segmental. No pneumothorax. 2. Dependent collapse/consolidation in both lower lungs with peripheral airway impaction in the posterior right lung base. Imaging features could be  related to atelectasis or aspiration. 3. Superior endplate compression deformities at T3, T4, and T5 are stable since 01/21/2024. L1 compression deformity is also chronic and unchanged. Old right pelvic fractures again noted. 4. No acute traumatic injury identified in the abdomen or pelvis. There may be trace fluid adjacent to the posterior spleen, but this is similar to the study from 1 week ago. No free fluid in the para colic gutters or pelvis. 5. Status post gastric bypass surgery. 6. Sigmoid diverticulosis without diverticulitis. 7. Aortic Atherosclerosis (ICD10-I70.0) and Emphysema (ICD10-J43.9). Electronically Signed   By: Donnal Fusi M.D.   On: 02/01/2024 10:16   CT Head Wo Contrast Result Date: 02/01/2024 CLINICAL DATA:  73 year old male status post fall down multiple stairs. EXAM: CT HEAD WITHOUT CONTRAST TECHNIQUE: Contiguous axial images were obtained from the base of the skull through the vertex without intravenous contrast. RADIATION DOSE REDUCTION: This exam was performed according to the departmental dose-optimization program which includes automated exposure control, adjustment of the mA and/or kV according to patient size and/or use of iterative reconstruction technique. COMPARISON:  Head CT 01/21/2024. FINDINGS: Brain: Bilateral anterior frontal convexity subarachnoid hemorrhage, moderate volume (series 4, image 27). Superimposed right side anterior convexity and para falcine subdural hematoma measures 4-5 mm in thickness. Contralateral lower density left side subdural hematoma which is most visible at the vertex, 5 mm thickness there (coronal image 33). Probable bifrontal hemorrhagic contusions, although not discrete at this time. Smaller volume anterior temporal, middle cranial fossa hyperdense blood also which is probably combined subarachnoid and subdural. No intraventricular hemorrhage. No ventriculomegaly. No significant midline shift (coronal image 45). No significant ventricular mass  effect. Basilar cisterns appear stable and normal. Stable gray-white differentiation elsewhere. No cortically based acute infarct identified. Vascular: Calcified atherosclerosis at the skull base. No suspicious intracranial vascular hyperdensity. Skull: Nondepressed midline occipital protuberance fracture tracking into the posterior foramen magnum on series 3, image 11. The fracture does not appear to extend above the occipital protuberance. Severely degenerated C1-C2, craniocervical junction and the visible C1 and C2 vertebrae appear stable compared to 01/21/2024 cervical spine CT. No other skull fracture identified. Sinuses/Orbits: Layering intermediate density fluid in the sphenoid sinuses. Intubated. Tympanic cavities and mastoids appear stable and well aerated. Other: Visualized orbit soft tissues are within normal limits. Partially visible endotracheal tube and oral enteric tube. Broad-based left occipital, suboccipital scalp hematoma. Underlying non depressed fracture through the midline occipital protuberance. Traumatic Brain Injury Risk Stratification Skull Fracture: Nondisplaced - Moderate/mBIG 2 Subdural Hematoma (SDH): 4mm to <53mm - mBIG 2.  However, bilateral. Subarachnoid Hemorrhage (  SAH): multifocal, bilateral - High/mBIG 3 Epidural Hematoma (EDH): No - Low/mBIG 1 Cerebral contusion, intra-axial, intraparenchymal Hemorrhage (IPH): Suspected but occult at this time. Intraventricular Hemorrhage (IVH): No - Low/mBIG 1 Midline Shift > 1mm or Edema/effacement of sulci/vents: No - Low/mBIG 1 ---------------------------------------------------- IMPRESSION: 1. Positive for multifocal acute posttraumatic intracranial hemorrhage: - Bilateral SDH (5 mm each side). - Bilateral anterior convexity SAH (moderate), likely with bifrontal hemorrhagic contusions being obscured at this time. 2. Nondepressed midline occipital protuberance fracture tracking into the posterior foramen magnum. Overlying scalp hematoma. 3. No  significant intracranial mass effect or midline shift. No ventriculomegaly or intraventricular basilar cisterns appear normal. Study discussed by telephone with Dr. Dorena Gander on 02/01/2024 at 09:59 . Electronically Signed   By: Marlise Simpers M.D.   On: 02/01/2024 10:01   DG Pelvis Portable Result Date: 02/01/2024 CLINICAL DATA:  Trauma, fell downstairs EXAM: PORTABLE PELVIS 1-2 VIEWS COMPARISON:  01/21/2024 FINDINGS: Supine frontal view of the pelvis includes both hips. Stable subacute fractures involving the right superior and inferior pubic rami, left parasymphyseal region, and right sacral ala. I do not see any new acute displaced fractures. Stable symmetrical bilateral hip osteoarthritis and lower lumbar degenerative changes. IMPRESSION: 1. Stable subacute fractures involving the right sacral ala, right superior and inferior rami, and left parasymphyseal region as above. 2. No acute bony abnormalities. Electronically Signed   By: Bobbye Burrow M.D.   On: 02/01/2024 09:52   DG Chest Portable 1 View Result Date: 02/01/2024 CLINICAL DATA:  Trauma, fell downstairs EXAM: PORTABLE CHEST 1 VIEW COMPARISON:  01/24/2024 FINDINGS: Supine frontal view of the chest was obtained, excluding the apices and left lateral hemithorax by collimation. Endotracheal tube overlies tracheal air column, tip 4.5 cm above carina. Enteric catheter passes below diaphragm, tip excluded by collimation. Cardiac silhouette is unremarkable. There are displaced segmental right third through eighth rib fractures again noted, with small right pleural effusion slightly increased since prior study. No evidence of pneumothorax on this supine exam. Likely nondisplaced left posterior fifth rib fracture. Otherwise the left chest is clear. IMPRESSION: 1. Limited evaluation due to patient positioning. 2. Support devices as above. 3. Segmental right lateral third through eighth rib fractures unchanged since prior exam, with small right pleural effusion  slightly increased since prior exam. No evidence of pneumothorax on this supine exam. 4. Suspected nondisplaced left posterior fifth rib fracture, new since prior exam. Electronically Signed   By: Bobbye Burrow M.D.   On: 02/01/2024 09:50    Review of Systems  Unable to perform ROS: Intubated    Blood pressure 126/80, pulse 75, temperature (!) 95.8 F (35.4 C), resp. rate 18, height 5\' 8"  (1.727 m), weight 62 kg, SpO2 97%. Physical Exam Constitutional:      Comments: Unconscious  HENT:     Head:     Comments: Posterior scalp hematoma    Nose: Nose normal.     Mouth/Throat:     Mouth: Mucous membranes are dry.  Eyes:     General: No scleral icterus.    Comments: Pupils 6 mm equal and sluggish  Neck:     Comments: Cervical collar Cardiovascular:     Rate and Rhythm: Normal rate.  Pulmonary:     Effort: Pulmonary effort is normal.     Breath sounds: No wheezing or rhonchi.  Abdominal:     General: Abdomen is flat. There is no distension.     Palpations: Abdomen is soft.     Tenderness: There is no  abdominal tenderness.     Comments: Multiple surgical scars  Genitourinary:    Penis: Normal.   Musculoskeletal:        General: No swelling or deformity.  Skin:    General: Skin is dry.  Neurological:     Comments: GCS 3      Assessment/Plan 73 year old status post fall downstairs with seizure activity  TBI/bilateral subdural hematoma/anterior subarachnoid hemorrhage/skull fracture -Dr. Rochelle Chu from neurosurgery is evaluating the patient.  Consult called at 10 AM.  Loaded with Keppra . Bilateral rib fractures that were sustained in a fall on 01/21/2024, similar on follow-up CT scan today Acute hypoxic ventilator dependent respiratory failure -full support Foley: Placed in trauma bay FEN: N.p.o., normal saline infusion VTE: PAS Disposition: Admit to neurotrauma ICU  Critical care 1 hour and 20 minutes  Cloyce Darby, MD 02/01/2024, 10:32 AM

## 2024-02-01 NOTE — ED Triage Notes (Signed)
 PT BIB EMS from home for falling down 14 steps, recently released from the hospital for recent falls with ETOH, unsure if ETOH playing a role this morning. Patient alert but not speaking or following commands on scene, started having seizures, nose bleed, and large hemotoma noted to the back of his head, skin tear to left elbow, old bruise to left chest.  5 mg Versed IM 2.5 mg Versed IV 2.5 mg Versed IV  CBG 169 HR 70 150/80

## 2024-02-01 NOTE — ED Notes (Signed)
 Neuro Surgeon called Hildy Lowers, MD at 10:00.

## 2024-02-01 NOTE — Progress Notes (Addendum)
 Transition of Care [facility]) - CAGE-AID Screening   Patient Details  Name: [name] MRN: [id number] Date of Birth: 1951-07-11    Asa Bjork, RN Trauma Response Nurse Phone Number: [phone] 02/01/2024, 12:19 PM     CAGE-AID Screening: Substance Abuse Screening unable to be completed due to: : (S) Patient unable to participate (Intubated, SDH, SAH, skull fxs)     Pt has a hx of ETOH abuse.Last admission ETOH was 295. Hx of liver transplant x 2

## 2024-02-01 NOTE — Progress Notes (Deleted)
 Orthopedic Tech Progress Note Patient Details:  [name] [birth year] [id number] Level 2 Trauma  Patient ID: [name], male   DOB: 08-07-1951, 73 y.o.   MRN: [id number]  [name] 02/01/2024, 9:37 AM

## 2024-02-01 NOTE — ED Notes (Signed)
 ET tube placed by Long, MD @ [phone], 25 @ lip, color change noted.  Rolled, small skin tear to left elbow, hematomata Left occipital, minimal bleeding. 2536  OG, 16 French placed by Mariah Shines, RN. [phone]  Rolled to CT @ [phone], room 4.  16 French temp sensing, Foley 10:10 by Jorie Newness, NT  Neuro at bedside @ 10:15

## 2024-02-02 ENCOUNTER — Inpatient Hospital Stay (HOSPITAL_COMMUNITY): Payer: Medicare (Managed Care)

## 2024-02-02 LAB — BASIC METABOLIC PANEL WITH GFR
Anion gap: 12 (ref 5–15)
BUN: 32 mg/dL — ABNORMAL HIGH (ref 8–23)
CO2: 18 mmol/L — ABNORMAL LOW (ref 22–32)
Calcium: 8.1 mg/dL — ABNORMAL LOW (ref 8.9–10.3)
Chloride: 103 mmol/L (ref 98–111)
Creatinine, Ser: 2.79 mg/dL — ABNORMAL HIGH (ref 0.61–1.24)
GFR, Estimated: 24 mL/min — ABNORMAL LOW (ref >60)
Glucose, Bld: 97 mg/dL (ref 70–99)
Potassium: 4 mmol/L (ref 3.5–5.1)
Sodium: 133 mmol/L — ABNORMAL LOW (ref 135–145)

## 2024-02-02 LAB — CBC
HCT: 26 % — ABNORMAL LOW (ref 39.0–52.0)
Hemoglobin: 8.4 g/dL — ABNORMAL LOW (ref 13.0–17.0)
MCH: 30.2 pg (ref 26.0–34.0)
MCHC: 32.3 g/dL (ref 30.0–36.0)
MCV: 93.5 fL (ref 80.0–100.0)
Platelets: 261 10*3/uL (ref 150–400)
RBC: 2.78 MIL/uL — ABNORMAL LOW (ref 4.22–5.81)
RDW: 16.5 % — ABNORMAL HIGH (ref 11.5–15.5)
WBC: 6.9 10*3/uL (ref 4.0–10.5)
nRBC: 0 % (ref 0.0–0.2)

## 2024-02-02 LAB — MAGNESIUM: Magnesium: 2.2 mg/dL (ref 1.7–2.4)

## 2024-02-02 LAB — GLUCOSE, CAPILLARY
Glucose-Capillary: 111 mg/dL — ABNORMAL HIGH (ref 70–99)
Glucose-Capillary: 128 mg/dL — ABNORMAL HIGH (ref 70–99)

## 2024-02-02 LAB — PHOSPHORUS: Phosphorus: 4.7 mg/dL — ABNORMAL HIGH (ref 2.5–4.6)

## 2024-02-02 LAB — TRIGLYCERIDES: Triglycerides: 57 mg/dL (ref <150)

## 2024-02-02 LAB — MRSA NEXT GEN BY PCR, NASAL: MRSA by PCR Next Gen: NOT DETECTED

## 2024-02-02 LAB — HIV ANTIBODY (ROUTINE TESTING W REFLEX): HIV Screen 4th Generation wRfx: NONREACTIVE

## 2024-02-02 MED ORDER — LORAZEPAM 1 MG PO TABS
0.0000 mg | ORAL_TABLET | ORAL | Status: AC
  Administered 2024-02-03 (×2): 1 mg
  Filled 2024-02-02: qty 1
  Filled 2024-02-02: qty 4
  Filled 2024-02-02: qty 1

## 2024-02-02 MED ORDER — TACROLIMUS 0.5 MG PO CAPS
0.5000 mg | ORAL_CAPSULE | Freq: Two times a day (BID) | ORAL | Status: DC
  Administered 2024-02-02 (×2): 0.5 mg via SUBLINGUAL
  Filled 2024-02-02 (×4): qty 1

## 2024-02-02 MED ORDER — LORAZEPAM 1 MG PO TABS
0.0000 mg | ORAL_TABLET | Freq: Three times a day (TID) | ORAL | Status: AC
  Administered 2024-02-04 – 2024-02-05 (×3): 1 mg
  Administered 2024-02-05 – 2024-02-06 (×3): 2 mg
  Filled 2024-02-02: qty 1
  Filled 2024-02-02: qty 2
  Filled 2024-02-02: qty 1
  Filled 2024-02-02: qty 4
  Filled 2024-02-02 (×2): qty 2
  Filled 2024-02-02: qty 1

## 2024-02-02 MED ORDER — PIVOT 1.5 CAL PO LIQD
1000.0000 mL | ORAL | Status: DC
  Administered 2024-02-02: 1000 mL

## 2024-02-02 MED ORDER — FREE WATER
200.0000 mL | Freq: Three times a day (TID) | Status: DC
  Administered 2024-02-02 – 2024-02-06 (×10): 200 mL

## 2024-02-02 MED ORDER — PIVOT 1.5 CAL PO LIQD
1000.0000 mL | ORAL | Status: DC
  Administered 2024-02-02 – 2024-02-03 (×2): 1000 mL

## 2024-02-02 MED ORDER — ADULT MULTIVITAMIN W/MINERALS CH
1.0000 | ORAL_TABLET | Freq: Every day | ORAL | Status: DC
  Administered 2024-02-02 – 2024-02-06 (×4): 1
  Filled 2024-02-02 (×4): qty 1

## 2024-02-02 MED ORDER — FOLIC ACID 1 MG PO TABS
1.0000 mg | ORAL_TABLET | Freq: Every day | ORAL | Status: DC
  Administered 2024-02-02 – 2024-02-06 (×4): 1 mg
  Filled 2024-02-02 (×4): qty 1

## 2024-02-02 MED ORDER — THIAMINE HCL 100 MG/ML IJ SOLN
100.0000 mg | Freq: Every day | INTRAMUSCULAR | Status: DC
  Administered 2024-02-03: 100 mg via INTRAVENOUS
  Filled 2024-02-02: qty 2

## 2024-02-02 MED ORDER — DEXMEDETOMIDINE HCL IN NACL 400 MCG/100ML IV SOLN
0.0000 ug/kg/h | INTRAVENOUS | Status: DC
  Administered 2024-02-02: 0.4 ug/kg/h via INTRAVENOUS
  Administered 2024-02-02: 0.8 ug/kg/h via INTRAVENOUS
  Filled 2024-02-02 (×2): qty 100

## 2024-02-02 MED ORDER — VITAL HIGH PROTEIN PO LIQD: 1000.0000 mL | ORAL | Status: DC

## 2024-02-02 MED ORDER — THIAMINE MONONITRATE 100 MG PO TABS
100.0000 mg | ORAL_TABLET | Freq: Every day | ORAL | Status: DC
  Administered 2024-02-02 – 2024-02-06 (×4): 100 mg
  Filled 2024-02-02 (×4): qty 1

## 2024-02-02 MED ORDER — PROSOURCE TF20 ENFIT COMPATIBL EN LIQD
60.0000 mL | Freq: Every day | ENTERAL | Status: DC
  Administered 2024-02-02: 60 mL
  Filled 2024-02-02: qty 60

## 2024-02-02 NOTE — Progress Notes (Signed)
 Subjective: Patient reports still intubated and sedated  Objective: Vital signs in last 24 hours: Temp:  [95.7 F (35.4 C)-99.7 F (37.6 C)] 99 F (37.2 C) (05/29 0500) Pulse Rate:  [71-85] 74 (05/29 0500) Resp:  [16-29] 20 (05/29 0500) BP: (113-159)/(68-85) 134/78 (05/29 0500) SpO2:  [96 %-100 %] 100 % (05/29 0500) FiO2 (%):  [40 %-100 %] 40 % (05/29 0332) Weight:  [62 kg] 62 kg (05/28 0929)  Intake/Output from previous day: 05/28 0701 - 05/29 0700 In: 2046.3 [I.V.:2036.3; IV Piggyback:10] Out: 1345 [Urine:1345] Intake/Output this shift: No intake/output data recorded.  Neuro: definitely attempts to open eyes to noxious stimuli, localizes well.   Lab Results: Lab Results  Component Value Date   WBC 6.9 02/02/2024   HGB 8.4 (L) 02/02/2024   HCT 26.0 (L) 02/02/2024   MCV 93.5 02/02/2024   PLT 261 02/02/2024   Lab Results  Component Value Date   INR 1.1 02/01/2024   BMET Lab Results  Component Value Date   NA 133 (L) 02/02/2024   K 4.0 02/02/2024   CL 103 02/02/2024   CO2 18 (L) 02/02/2024   GLUCOSE 97 02/02/2024   BUN 32 (H) 02/02/2024   CREATININE 2.79 (H) 02/02/2024   CALCIUM 8.1 (L) 02/02/2024    Studies/Results: CT HEAD WO CONTRAST Addendum Date: 02/02/2024 ADDENDUM REPORT: 02/02/2024 06:09 ADDENDUM: Study discussed by telephone with RN Shelly on 02/02/2024 at 0552 hours. Electronically Signed   By: Marlise Simpers M.D.   On: 02/02/2024 06:09   Result Date: 02/02/2024 CLINICAL DATA:  73 year old male status post fall down multiple steps with nondepressed midline occipital skull fracture and multifocal posttraumatic intracranial hemorrhage. EXAM: CT HEAD WITHOUT CONTRAST TECHNIQUE: Contiguous axial images were obtained from the base of the skull through the vertex without intravenous contrast. RADIATION DOSE REDUCTION: This exam was performed according to the departmental dose-optimization program which includes automated exposure control, adjustment of the mA and/or  kV according to patient size and/or use of iterative reconstruction technique. COMPARISON:  Head CT yesterday. FINDINGS: Brain: Isodense bilateral subdural hematoma appears increased on coronal images bilaterally, now 6-7 mm bilaterally (coronal image 28) versus 5 mm yesterday. The subdural blood also appears somewhat more holo hemispheric. Layering hyperdense subdural blood products along the posterior convexity. Small volume para falcine and tentorial blood has not significantly changed. Scattered bilateral subarachnoid hemorrhage again noted, more apparent in the right sylvian fissure now but overall volume has not significantly changed. Bifrontal hemorrhagic contusions remain a concern, although no discrete or measurable intra-axial hemorrhage at this time (series 4, image 25). And no new or progressed anterior frontal or temporal lobe edema. No midline shift. There is now trace intraventricular blood (atrium of the right lateral ventricle series 4, image 19). No ventriculomegaly. Basilar cisterns remain patent. Subtle interval effacement of the suprasellar cistern. Stable gray-white matter differentiation throughout the brain. No cortically based acute infarct identified. Vascular: Calcified atherosclerosis at the skull base. No suspicious intracranial vascular hyperdensity. Skull: Stable nondepressed central and right paracentral occipital skull fracture tracking to the posterior foramen magnum (series 3, image 10). Superimposed chronic severe upper cervical spine degeneration. No 2nd skull fracture is identified. Sinuses/Orbits: Intubated. Intermediate density fluid persists in the sphenoid sinuses. Other Visualized paranasal sinuses and mastoids are stable and well aerated. Other: Occipital protuberance scalp hematoma has regressed. Elsewhere stable orbit and scalp soft tissues. IMPRESSION: 1. Symmetrically increased bilateral Subdural Hematomas since yesterday, now up to 7 mm on both sides, versus 5 mm  initially. 2.  Bilateral subarachnoid hemorrhage not significantly changed. Trace IVH now. No ventriculomegaly. No discrete or measurable hemorrhagic contusion. 3. No midline shift. Basilar cisterns remain patent. 4. Stable nondepressed occipital skull fracture tracking to the posterior foramen magnum. Electronically Signed: By: Marlise Simpers M.D. On: 02/02/2024 05:41   CT CHEST ABDOMEN PELVIS W CONTRAST Result Date: 02/01/2024 CLINICAL DATA:  Patient fell down stairs.  Seizure. EXAM: CT CHEST, ABDOMEN, AND PELVIS WITH CONTRAST TECHNIQUE: Multidetector CT imaging of the chest, abdomen and pelvis was performed following the standard protocol during bolus administration of intravenous contrast. RADIATION DOSE REDUCTION: This exam was performed according to the departmental dose-optimization program which includes automated exposure control, adjustment of the mA and/or kV according to patient size and/or use of iterative reconstruction technique. CONTRAST:  75mL OMNIPAQUE  IOHEXOL  350 MG/ML SOLN COMPARISON:  01/21/2024 FINDINGS: CT CHEST FINDINGS Cardiovascular: Heart is enlarged. No substantial pericardial effusion. Coronary artery calcification is evident. Mild atherosclerotic calcification is noted in the wall of the thoracic aorta. Mediastinum/Nodes: No mediastinal lymphadenopathy. There is no hilar lymphadenopathy. The esophagus has normal imaging features. There is no axillary lymphadenopathy. Lungs/Pleura: Centrilobular and paraseptal emphysema evident. No pneumothorax. Endotracheal tube tip is in the midtrachea. Dependent collapse/consolidation noted in both lower lungs with peripheral airway impaction in the posterior right lung base. Musculoskeletal: Multiple acute and nonacute right-sided rib fractures evident. Acute displaced rib fractures are seen in the lateral right third through eighth ribs and in the posterior right fourth through tenth ribs, makingthe right fourth through eighth rib fractures segmental.  Multiple healed left-sided rib fractures evident. Old sternal fracture noted. Superior endplate compression deformity at T3 is stable since prior. Mild superior endplate compression deformity at T4 and T5 is also unchanged since previous. CT ABDOMEN PELVIS FINDINGS Hepatobiliary: No suspicious focal abnormality within the liver parenchyma. Pneumobilia again noted. Cholecystectomy. No intrahepatic or extrahepatic biliary dilation. Pancreas: No focal mass lesion. No dilatation of the main duct. No intraparenchymal cyst. No peripancreatic edema. Spleen: No splenomegaly. No suspicious focal mass lesion. Adrenals/Urinary Tract: No adrenal nodule or mass. Tiny well-defined homogeneous low-density lesions in both kidneys are too small to characterize but are statistically most likely benign and probably cysts. No followup imaging is recommended. No evidence for hydroureter. The urinary bladder appears normal for the degree of distention. Stomach/Bowel: NG tube tip is in the stomach. 2.4 x 2.3 cm collection of fluid and gas is seen adjacent to the pyloric region of the stomach (image 63/3 and sagittal 60/7. This is associated with a gas collection anterior to the antrum of the stomach (delayed image 24/8). 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 Presumably excluded portion of the stomach is distended. Duodenum is normally positioned as is the ligament of Treitz. No small bowel dilatation. The terminal ileum is normal. The appendix is normal. Contrast material in the colon likely residual from the study from 8 days ago. Diverticular changes are noted in the sigmoid colon. Vascular/Lymphatic: There is moderate atherosclerotic calcification of  the abdominal aorta without aneurysm. There is no gastrohepatic or hepatoduodenal ligament lymphadenopathy. No retroperitoneal or mesenteric lymphadenopathy. No pelvic sidewall lymphadenopathy. Reproductive: The prostate gland and seminal vesicles are unremarkable. Other: No free fluid around the liver. There may be some trace fluid adjacent  to the posterior spleen (image 54/3) no free fluid is seen in the pelvis. Musculoskeletal: L1 compression deformity is similar to prior old fractures noted right superior and inferior pubic rami and in the right sacrum. IMPRESSION: 1. Multiple acute/subacute right-sided and nonacute bilateral rib fractures as seen on previous study from 01/21/2024. Acute/Subacute displaced rib fractures are seen in the lateral right third through eighth ribs and in the posterior right fourth through tenth ribs, making the right fourth through eighth rib fractures segmental. No pneumothorax. 2. Dependent collapse/consolidation in both lower lungs with peripheral airway impaction in the posterior right lung base. Imaging features could be related to atelectasis or aspiration. 3. Superior endplate compression deformities at T3, T4, and T5 are stable since 01/21/2024. L1 compression deformity is also chronic and unchanged. Old right pelvic fractures again noted. 4. No acute traumatic injury identified in the abdomen or pelvis. There may be trace fluid adjacent to the posterior spleen, but this is similar to the study from 1 week ago. No free fluid in the para colic gutters or pelvis. 5. Status post gastric bypass surgery. 6. Sigmoid diverticulosis without diverticulitis. 7. Aortic Atherosclerosis (ICD10-I70.0) and Emphysema (ICD10-J43.9). Electronically Signed   By: Donnal Fusi M.D.   On: 02/01/2024 10:16   CT Head Wo Contrast Result Date: 02/01/2024 CLINICAL DATA:  73 year old male status post fall down multiple stairs. EXAM: CT HEAD WITHOUT CONTRAST TECHNIQUE: Contiguous axial images were  obtained from the base of the skull through the vertex without intravenous contrast. RADIATION DOSE REDUCTION: This exam was performed according to the departmental dose-optimization program which includes automated exposure control, adjustment of the mA and/or kV according to patient size and/or use of iterative reconstruction technique. COMPARISON:  Head CT 01/21/2024. FINDINGS: Brain: Bilateral anterior frontal convexity subarachnoid hemorrhage, moderate volume (series 4, image 27). Superimposed right side anterior convexity and para falcine subdural hematoma measures 4-5 mm in thickness. Contralateral lower density left side subdural hematoma which is most visible at the vertex, 5 mm thickness there (coronal image 33). Probable bifrontal hemorrhagic contusions, although not discrete at this time. Smaller volume anterior temporal, middle cranial fossa hyperdense blood also which is probably combined subarachnoid and subdural. No intraventricular hemorrhage. No ventriculomegaly. No significant midline shift (coronal image 45). No significant ventricular mass effect. Basilar cisterns appear stable and normal. Stable gray-white differentiation elsewhere. No cortically based acute infarct identified. Vascular: Calcified atherosclerosis at the skull base. No suspicious intracranial vascular hyperdensity. Skull: Nondepressed midline occipital protuberance fracture tracking into the posterior foramen magnum on series 3, image 11. The fracture does not appear to extend above the occipital protuberance. Severely degenerated C1-C2, craniocervical junction and the visible C1 and C2 vertebrae appear stable compared to 01/21/2024 cervical spine CT. No other skull fracture identified. Sinuses/Orbits: Layering intermediate density fluid in the sphenoid sinuses. Intubated. Tympanic cavities and mastoids appear stable and well aerated. Other: Visualized orbit soft tissues are within normal limits. Partially visible endotracheal  tube and oral enteric tube. Broad-based left occipital, suboccipital scalp hematoma. Underlying non depressed fracture through the midline occipital protuberance. Traumatic Brain Injury Risk Stratification Skull Fracture: Nondisplaced - Moderate/mBIG 2 Subdural Hematoma (SDH): 4mm to <48mm - mBIG 2.  However, bilateral. Subarachnoid Hemorrhage [facility]): multifocal, bilateral - High/mBIG 3 Epidural Hematoma (EDH): No - Low/mBIG 1 Cerebral contusion, intra-axial, intraparenchymal Hemorrhage (IPH): Suspected but occult at this time. Intraventricular Hemorrhage (IVH): No - Low/mBIG 1 Midline Shift > 1mm or Edema/effacement of sulci/vents: No - Low/mBIG 1 ---------------------------------------------------- IMPRESSION: 1. Positive for multifocal  acute posttraumatic intracranial hemorrhage: - Bilateral SDH (5 mm each side). - Bilateral anterior convexity SAH (moderate), likely with bifrontal hemorrhagic contusions being obscured at this time. 2. Nondepressed midline occipital protuberance fracture tracking into the posterior foramen magnum. Overlying scalp hematoma. 3. No significant intracranial mass effect or midline shift. No ventriculomegaly or intraventricular basilar cisterns appear normal. Study discussed by telephone with Dr. Dorena Gander on 02/01/2024 at 09:59 . Electronically Signed   By: Marlise Simpers M.D.   On: 02/01/2024 10:01   DG Pelvis Portable Result Date: 02/01/2024 CLINICAL DATA:  Trauma, fell downstairs EXAM: PORTABLE PELVIS 1-2 VIEWS COMPARISON:  01/21/2024 FINDINGS: Supine frontal view of the pelvis includes both hips. Stable subacute fractures involving the right superior and inferior pubic rami, left parasymphyseal region, and right sacral ala. I do not see any new acute displaced fractures. Stable symmetrical bilateral hip osteoarthritis and lower lumbar degenerative changes. IMPRESSION: 1. Stable subacute fractures involving the right sacral ala, right superior and inferior rami, and left  parasymphyseal region as above. 2. No acute bony abnormalities. Electronically Signed   By: Bobbye Burrow M.D.   On: 02/01/2024 09:52   DG Chest Portable 1 View Result Date: 02/01/2024 CLINICAL DATA:  Trauma, fell downstairs EXAM: PORTABLE CHEST 1 VIEW COMPARISON:  01/24/2024 FINDINGS: Supine frontal view of the chest was obtained, excluding the apices and left lateral hemithorax by collimation. Endotracheal tube overlies tracheal air column, tip 4.5 cm above carina. Enteric catheter passes below diaphragm, tip excluded by collimation. Cardiac silhouette is unremarkable. There are displaced segmental right third through eighth rib fractures again noted, with small right pleural effusion slightly increased since prior study. No evidence of pneumothorax on this supine exam. Likely nondisplaced left posterior fifth rib fracture. Otherwise the left chest is clear. IMPRESSION: 1. Limited evaluation due to patient positioning. 2. Support devices as above. 3. Segmental right lateral third through eighth rib fractures unchanged since prior exam, with small right pleural effusion slightly increased since prior exam. No evidence of pneumothorax on this supine exam. 4. Suspected nondisplaced left posterior fifth rib fracture, new since prior exam. Electronically Signed   By: Bobbye Burrow M.D.   On: 02/01/2024 09:50    Assessment/Plan: CT head this morning shows slight increase in size of SDH, not surprising. However, patient is still doing the same neurologically. Continue to monitor for now   LOS: 1 day    Kenard Paul [facility] 02/02/2024, 7:38 AM

## 2024-02-02 NOTE — Progress Notes (Signed)
 Patient ID: [name], male   DOB: 03/08/1951, 73 y.o.   MRN: [id number] Follow up - Trauma Critical Care   Patient Details:    [name] is an 73 y.o. male.  Lines/tubes : Airway 7.5 mm (Active)  Secured at (cm) 24 cm 02/02/24 0801  Measured From Lips 02/02/24 0801  Secured Location Left 02/02/24 0801  Secured By [employer] 02/02/24 0801  Bite Block No 02/02/24 0801  Tube Holder Repositioned Yes 02/02/24 0801  Prone position No 02/02/24 0801  Cuff Pressure (cm H2O) Clear OR 27-39 CmH2O 02/02/24 0801  Site Condition Dry 02/02/24 0801     NG/OG Vented/Dual Lumen 16 Fr. Oral (Active)  Tube Position (Required) Marking at nare/corner of mouth 02/01/24 2000  Ongoing Placement Verification (Required) (See row information) Yes 02/01/24 2000  Site Assessment Clean, Dry, Intact 02/01/24 2000  Status Clamped 02/01/24 2000     Urethral Catheter Makayla, NT Temperature probe 16 Fr. (Active)  Indication for Insertion or Continuance of Catheter Unstable critically ill patients first 24-48 hours (See Criteria) 02/01/24 2000  Site Assessment Clean, Dry, Intact 02/01/24 2000  Catheter Maintenance Bag below level of bladder;Drainage bag/tubing not touching floor;Insertion date on drainage bag;Seal intact;Bag emptied prior to transport;No dependent loops;Catheter secured 02/01/24 2000  Collection Container Standard drainage bag 02/01/24 2000  Securement Method Adhesive securement device 02/01/24 2000  Urinary Catheter Interventions (if applicable) Unclamped 02/01/24 1114  Output (mL) 500 mL 02/02/24 0500    Microbiology/Sepsis markers: No results found for this or any previous visit.  Anti-infectives:  Anti-infectives (From admission, onward)    None       Consults: Treatment Team:  Joaquin Mulberry, MD    Studies:    Events:  Subjective:    Overnight Issues: F/U CT head   Objective:  Vital signs for last 24 hours: Temp:  [95.7 F (35.4 C)-99.7  F (37.6 C)] 99 F (37.2 C) (05/29 0500) Pulse Rate:  [71-85] 74 (05/29 0500) Resp:  [16-29] 20 (05/29 0500) BP: (113-159)/(68-85) 134/78 (05/29 0500) SpO2:  [96 %-100 %] 100 % (05/29 0801) FiO2 (%):  [40 %-100 %] 40 % (05/29 0801) Weight:  [62 kg] 62 kg (05/28 0929)  Hemodynamic parameters for last 24 hours:    Intake/Output from previous day: 05/28 0701 - 05/29 0700 In: 2046.3 [I.V.:2036.3; IV Piggyback:10] Out: 1345 [Urine:1345]  Intake/Output this shift: No intake/output data recorded.  Vent settings for last 24 hours: Vent Mode: PRVC FiO2 (%):  [40 %-100 %] 40 % Set Rate:  [20 bmp] 20 bmp Vt Set:  [540 mL] 540 mL PEEP:  [5 cmH20] 5 cmH20 Plateau Pressure:  [15 cmH20-24 cmH20] 15 cmH20  Physical Exam:  General: vent Neuro: arouses, moves UE and LE but not F/C HEENT/Neck: ETT Resp: clear to auscultation bilaterally CVS: RRR GI: soft, NT, ND Extremities: valves soft  Results for orders placed or performed during the hospital encounter of 02/01/24 (from the past 24 hours)  CDS serology     Status: None   Collection Time: 02/01/24 10:00 AM  Result Value Ref Range   CDS serology specimen      SPECIMEN WILL BE HELD FOR 14 DAYS IF TESTING IS REQUIRED  Comprehensive metabolic panel     Status: Abnormal   Collection Time: 02/01/24 10:00 AM  Result Value Ref Range   Sodium 129 (L) 135 - 145 mmol/L   Potassium 4.3 3.5 - 5.1 mmol/L   Chloride 96 (L) 98 - 111 mmol/L  CO2 25 22 - 32 mmol/L   Glucose, Bld 126 (H) 70 - 99 mg/dL   BUN 37 (H) 8 - 23 mg/dL   Creatinine, Ser 1.61 (H) 0.61 - 1.24 mg/dL   Calcium 8.3 (L) 8.9 - 10.3 mg/dL   Total Protein 5.6 (L) 6.5 - 8.1 g/dL   Albumin 2.4 (L) 3.5 - 5.0 g/dL   AST 23 15 - 41 U/L   ALT 14 0 - 44 U/L   Alkaline Phosphatase 114 38 - 126 U/L   Total Bilirubin 0.7 0.0 - 1.2 mg/dL   GFR, Estimated 21 (L) >60 mL/min   Anion gap 8 5 - 15  CBC     Status: Abnormal   Collection Time: 02/01/24 10:00 AM  Result Value Ref Range    WBC 5.9 4.0 - 10.5 K/uL   RBC 2.85 (L) 4.22 - 5.81 MIL/uL   Hemoglobin 8.6 (L) 13.0 - 17.0 g/dL   HCT 09.6 (L) 04.5 - 40.9 %   MCV 95.4 80.0 - 100.0 fL   MCH 30.2 26.0 - 34.0 pg   MCHC 31.6 30.0 - 36.0 g/dL   RDW 81.1 (H) 91.4 - 78.2 %   Platelets 232 150 - 400 K/uL   nRBC 0.0 0.0 - 0.2 %  Ethanol     Status: None   Collection Time: 02/01/24 10:00 AM  Result Value Ref Range   Alcohol , Ethyl (B) <15 <15 mg/dL  Protime-INR     Status: None   Collection Time: 02/01/24 10:00 AM  Result Value Ref Range   Prothrombin Time 14.0 11.4 - 15.2 seconds   INR 1.1 0.8 - 1.2  Sample to Blood Bank     Status: None   Collection Time: 02/01/24 10:00 AM  Result Value Ref Range   Blood Bank Specimen SAMPLE AVAILABLE FOR TESTING    Sample Expiration      02/04/2024,2359 Performed at [facility], [address], [place], [place] [address]   I-Stat Chem 8, ED ([facility] only)     Status: Abnormal   Collection Time: 02/01/24 10:04 AM  Result Value Ref Range   Sodium 131 (L) 135 - 145 mmol/L   Potassium 4.3 3.5 - 5.1 mmol/L   Chloride 93 (L) 98 - 111 mmol/L   BUN 37 (H) 8 - 23 mg/dL   Creatinine, Ser 3.08 (H) 0.61 - 1.24 mg/dL   Glucose, Bld 657 (H) 70 - 99 mg/dL   Calcium, Ion 8.46 9.62 - 1.40 mmol/L   TCO2 25 22 - 32 mmol/L   Hemoglobin 8.8 (L) 13.0 - 17.0 g/dL   HCT 95.2 (L) 84.1 - 32.4 %  I-Stat arterial blood gas, ED     Status: Abnormal   Collection Time: 02/01/24 10:09 AM  Result Value Ref Range   pH, Arterial 7.425 7.35 - 7.45   pCO2 arterial 40.8 32 - 48 mmHg   pO2, Arterial 338 (H) 83 - 108 mmHg   Bicarbonate 26.9 20.0 - 28.0 mmol/L   TCO2 28 22 - 32 mmol/L   O2 Saturation 100 %   Acid-Base Excess 2.0 0.0 - 2.0 mmol/L   Sodium 131 (L) 135 - 145 mmol/L   Potassium 4.0 3.5 - 5.1 mmol/L   Calcium, Ion 1.21 1.15 - 1.40 mmol/L   HCT 26.0 (L) 39.0 - 52.0 %   Hemoglobin 8.8 (L) 13.0 - 17.0 g/dL   Patient temperature 40.1 F    Collection site RADIAL, ALLEN'S TEST ACCEPTABLE    Drawn  by RT    Sample type ARTERIAL   Triglycerides     Status: None   Collection Time: 02/02/24  4:36 AM  Result Value Ref Range   Triglycerides 57 <150 mg/dL  CBC     Status: Abnormal   Collection Time: 02/02/24  4:36 AM  Result Value Ref Range   WBC 6.9 4.0 - 10.5 K/uL   RBC 2.78 (L) 4.22 - 5.81 MIL/uL   Hemoglobin 8.4 (L) 13.0 - 17.0 g/dL   HCT 16.1 (L) 09.6 - 04.5 %   MCV 93.5 80.0 - 100.0 fL   MCH 30.2 26.0 - 34.0 pg   MCHC 32.3 30.0 - 36.0 g/dL   RDW 40.9 (H) 81.1 - 91.4 %   Platelets 261 150 - 400 K/uL   nRBC 0.0 0.0 - 0.2 %  Basic metabolic panel     Status: Abnormal   Collection Time: 02/02/24  4:36 AM  Result Value Ref Range   Sodium 133 (L) 135 - 145 mmol/L   Potassium 4.0 3.5 - 5.1 mmol/L   Chloride 103 98 - 111 mmol/L   CO2 18 (L) 22 - 32 mmol/L   Glucose, Bld 97 70 - 99 mg/dL   BUN 32 (H) 8 - 23 mg/dL   Creatinine, Ser 7.82 (H) 0.61 - 1.24 mg/dL   Calcium 8.1 (L) 8.9 - 10.3 mg/dL   GFR, Estimated 24 (L) >60 mL/min   Anion gap 12 5 - 15    Assessment & Plan: Present on Admission:  Subdural hematoma (HCC)    LOS: 1 day   Additional comments:I reviewed the patient's new clinical lab test results. / 73 year old status post fall downstairs with seizure activity  TBI/bilateral subdural hematoma/anterior subarachnoid hemorrhage/skull fracture - per Dr. Rochelle Chu  Loaded with Keppra  in ED, continue BID, F/U CT head slightly increased SDH but exam stable Bilateral rib fractures that were sustained in a fall on 01/21/2024, similar on follow-up CT scan today Acute hypoxic ventilator dependent respiratory failure -weaning trials Foley: Placed in trauma bay FEN: N.p.o., start TF, FWF VTE: PAS Dispo: Admit to neurotrauma ICU Critical Care Total Time*: 38 Minutes  Dorena Gander, MD, MPH, FACS Trauma & General Surgery Use [email or web address] to contact on call provider  02/02/2024  *Care during the described time interval was provided by me. I have reviewed this patient's  available data, including medical history, events of note, physical examination and test results as part of my evaluation.

## 2024-02-02 NOTE — Progress Notes (Signed)
 Pt transported from 4N20 to CT and back without complications.

## 2024-02-02 NOTE — Progress Notes (Signed)
 Radiologist Dr Del Favia called to notify that the bilateral SDH have gotten larger (this AM CT scan) from 5mm to 7mm, no midline shift and clinically he is still the same. Notified Neurosurgery on call.

## 2024-02-02 NOTE — Progress Notes (Signed)
 Initial Nutrition Assessment  DOCUMENTATION CODES:   Severe malnutrition in context of chronic illness  INTERVENTION:   Initiate tube feeding via Cortrak tube: Pivot 1.5 at 30 ml/h and increase by 10 ml every 8 hours to goal rate of 50 ml/hr (1200 ml per day)  Provides 1800 kcal, 112 gm protein, 912 ml free water  daily  Continue MVI, folic acid , thiamine   200 ml free water  every 8 hours Total free water : 1512 ml   NUTRITION DIAGNOSIS:   Severe Malnutrition related to chronic illness as evidenced by severe fat depletion, severe muscle depletion.  GOAL:   Patient will meet greater than or equal to 90% of their needs  MONITOR:   TF tolerance  REASON FOR ASSESSMENT:   Consult Enteral/tube feeding initiation and management  ASSESSMENT:   Pt with PMH of ETOH abuse s/p 2 liver transplants admitted after fall, found at the bottom of the stairs, admitted with TBI/bilateral SDH, anterior SAH, skull fx, and bilateral rib fxs (from 5/17 fall).   Pt intubated, unable to answer any questions.   Medications reviewed and include: colace, folic acid , MVI with minerals, protonix , miralax , thiamine   Precedex   Fentanyl   Propofol  @   Labs reviewed:  CBG 111   16 F OG tube; tip gastric per xray   NUTRITION - FOCUSED PHYSICAL EXAM:  Flowsheet Row Most Recent Value  Orbital Region Severe depletion  Upper Arm Region Severe depletion  Thoracic and Lumbar Region Severe depletion  Buccal Region Unable to assess  Temple Region Severe depletion  Clavicle Bone Region Severe depletion  Clavicle and Acromion Bone Region Severe depletion  Scapular Bone Region Severe depletion  Dorsal Hand Unable to assess  Patellar Region Severe depletion  Anterior Thigh Region Severe depletion  Posterior Calf Region Unable to assess  Edema (RD Assessment) None  Hair Reviewed  Eyes Unable to assess  Mouth Unable to assess  Skin Reviewed  Nails Unable to assess       Diet Order:   Diet  Order     None       EDUCATION NEEDS:   Not appropriate for education at this time  Skin:  Skin Assessment: Reviewed RN Assessment  Last BM:  unknown  Height:   Ht Readings from Last 1 Encounters:  02/01/24 5\' 8"  (1.727 m)    Weight:   Wt Readings from Last 1 Encounters:  02/01/24 62 kg    BMI:  Body mass index is 20.78 kg/m.  Estimated Nutritional Needs:   Kcal:  1800-2000  Protein:  95-115 grams  Fluid:  > 1.8 L/day  Randine Butcher., RD, LDN, CNSC See AMiON for contact information

## 2024-02-02 NOTE — Care Management (Signed)
 Transition of Care [facility]) - Inpatient Brief Assessment   Patient Details  Name: [name] MRN: [id number] Date of Birth: 1951-08-04  Transition of Care [facility]) CM/SW Contact:    Ronni Colace, RN Phone Number: 02/02/2024, 12:47 PM   Clinical Narrative:  Patient presented with  fall apparently down the stairs. , he was found at the bottom of the stairs. He has a SO and Sister is listed as Next of kin. He was admitted with [facility] and  skull fracture . He is currently intubated on sedation. Attempting at weaning. SAH larger today than on admit.   TOC will follow  Transition of Care Asessment: Insurance and Status: Insurance coverage has been reviewed Patient has primary care physician: No Home environment has been reviewed: Apt Prior level of function:: independent , but has had frequent falls Prior/Current Home Services: No current home services Social Drivers of Health Review:  (Unable to assess)   Transition of care needs: transition of care needs identified, TOC will continue to follow

## 2024-02-03 ENCOUNTER — Inpatient Hospital Stay (HOSPITAL_COMMUNITY): Payer: Medicare (Managed Care)

## 2024-02-03 LAB — BASIC METABOLIC PANEL WITH GFR
Anion gap: 10 (ref 5–15)
BUN: 35 mg/dL — ABNORMAL HIGH (ref 8–23)
CO2: 21 mmol/L — ABNORMAL LOW (ref 22–32)
Calcium: 8.5 mg/dL — ABNORMAL LOW (ref 8.9–10.3)
Chloride: 103 mmol/L (ref 98–111)
Creatinine, Ser: 2.57 mg/dL — ABNORMAL HIGH (ref 0.61–1.24)
GFR, Estimated: 26 mL/min — ABNORMAL LOW (ref >60)
Glucose, Bld: 136 mg/dL — ABNORMAL HIGH (ref 70–99)
Potassium: 4.1 mmol/L (ref 3.5–5.1)
Sodium: 134 mmol/L — ABNORMAL LOW (ref 135–145)

## 2024-02-03 LAB — GLUCOSE, CAPILLARY
Glucose-Capillary: 113 mg/dL — ABNORMAL HIGH (ref 70–99)
Glucose-Capillary: 118 mg/dL — ABNORMAL HIGH (ref 70–99)
Glucose-Capillary: 120 mg/dL — ABNORMAL HIGH (ref 70–99)
Glucose-Capillary: 120 mg/dL — ABNORMAL HIGH (ref 70–99)
Glucose-Capillary: 122 mg/dL — ABNORMAL HIGH (ref 70–99)
Glucose-Capillary: 127 mg/dL — ABNORMAL HIGH (ref 70–99)
Glucose-Capillary: 141 mg/dL — ABNORMAL HIGH (ref 70–99)

## 2024-02-03 LAB — CBC
HCT: 30.9 % — ABNORMAL LOW (ref 39.0–52.0)
Hemoglobin: 10 g/dL — ABNORMAL LOW (ref 13.0–17.0)
MCH: 31.3 pg (ref 26.0–34.0)
MCHC: 32.4 g/dL (ref 30.0–36.0)
MCV: 96.9 fL (ref 80.0–100.0)
Platelets: 272 10*3/uL (ref 150–400)
RBC: 3.19 MIL/uL — ABNORMAL LOW (ref 4.22–5.81)
RDW: 16.6 % — ABNORMAL HIGH (ref 11.5–15.5)
WBC: 11.5 10*3/uL — ABNORMAL HIGH (ref 4.0–10.5)
nRBC: 0 % (ref 0.0–0.2)

## 2024-02-03 LAB — MAGNESIUM: Magnesium: 2 mg/dL (ref 1.7–2.4)

## 2024-02-03 LAB — PHOSPHORUS: Phosphorus: 5 mg/dL — ABNORMAL HIGH (ref 2.5–4.6)

## 2024-02-03 MED ORDER — TACROLIMUS 0.5 MG PO CAPS
0.5000 mg | ORAL_CAPSULE | Freq: Two times a day (BID) | ORAL | Status: DC
  Administered 2024-02-03 – 2024-02-23 (×41): 0.5 mg via SUBLINGUAL
  Filled 2024-02-03 (×42): qty 1

## 2024-02-03 MED ORDER — VITAL HIGH PROTEIN PO LIQD: 1000.0000 mL | ORAL | Status: DC

## 2024-02-03 MED ORDER — MORPHINE SULFATE (PF) 2 MG/ML IV SOLN: 2.0000 mg | INTRAVENOUS | Status: DC | PRN

## 2024-02-03 MED ORDER — PROSOURCE TF20 ENFIT COMPATIBL EN LIQD: 60.0000 mL | Freq: Every day | ENTERAL | Status: DC

## 2024-02-03 MED ORDER — OXYCODONE HCL 5 MG PO TABS
5.0000 mg | ORAL_TABLET | ORAL | Status: DC | PRN
  Administered 2024-02-04: 5 mg via ORAL
  Administered 2024-02-05: 10 mg via ORAL
  Administered 2024-02-05: 5 mg via ORAL
  Filled 2024-02-03 (×2): qty 1
  Filled 2024-02-03: qty 2

## 2024-02-03 MED ORDER — SPIRITUS FRUMENTI
1.0000 | Freq: Three times a day (TID) | ORAL | Status: DC
  Administered 2024-02-03 – 2024-02-06 (×5): 1 via ORAL
  Filled 2024-02-03 (×12): qty 1

## 2024-02-03 NOTE — Progress Notes (Signed)
 Trauma/Critical Care Follow Up Note  Subjective:    Overnight Issues:   Objective:  Vital signs for last 24 hours: Temp:  [96.6 F (35.9 C)-98.4 F (36.9 C)] 97.7 F (36.5 C) (05/30 1000) Pulse Rate:  [56-91] 84 (05/30 1000) Resp:  [12-24] 13 (05/30 1000) BP: (86-170)/(55-74) 96/60 (05/30 1000) SpO2:  [98 %-100 %] 98 % (05/30 1000) FiO2 (%):  [28 %-40 %] 28 % (05/30 0200)  Hemodynamic parameters for last 24 hours:    Intake/Output from previous day: 05/29 0701 - 05/30 0700 In: 1490.5 [I.V.:810.9; NG/GT:669.7; IV Piggyback:10] Out: 800 [Urine:800]  Intake/Output this shift: Total I/O In: -  Out: 625 [Urine:625]  Vent settings for last 24 hours: Vent Mode: PRVC FiO2 (%):  [28 %-40 %] 28 % Set Rate:  [20 bmp] 20 bmp Vt Set:  [540 mL] 540 mL PEEP:  [5 cmH20] 5 cmH20 Plateau Pressure:  [15 cmH20-20 cmH20] 15 cmH20  Physical Exam:  Gen: comfortable, no distress Neuro: follows commands, alert, communicative HEENT: PERRL Neck: supple CV: RRR Pulm: unlabored breathing on RA Abd: soft, NT  , no recent BM GU: urine clear and yellow, +Foley Extr: wwp, no edema  Results for orders placed or performed during the hospital encounter of 02/01/24 (from the past 24 hours)  Glucose, capillary     Status: Abnormal   Collection Time: 02/02/24 11:08 AM  Result Value Ref Range   Glucose-Capillary 111 (H) 70 - 99 mg/dL  Phosphorus     Status: Abnormal   Collection Time: 02/02/24  4:58 PM  Result Value Ref Range   Phosphorus 4.7 (H) 2.5 - 4.6 mg/dL  Magnesium     Status: None   Collection Time: 02/02/24  4:58 PM  Result Value Ref Range   Magnesium 2.2 1.7 - 2.4 mg/dL  MRSA Next Gen by PCR, Nasal     Status: None   Collection Time: 02/02/24  5:09 PM   Specimen: Nasal Mucosa; Nasal Swab  Result Value Ref Range   MRSA by PCR Next Gen NOT DETECTED NOT DETECTED  Glucose, capillary     Status: Abnormal   Collection Time: 02/02/24  8:16 PM  Result Value Ref Range    Glucose-Capillary 128 (H) 70 - 99 mg/dL  Glucose, capillary     Status: Abnormal   Collection Time: 02/03/24 12:01 AM  Result Value Ref Range   Glucose-Capillary 141 (H) 70 - 99 mg/dL  Glucose, capillary     Status: Abnormal   Collection Time: 02/03/24  3:50 AM  Result Value Ref Range   Glucose-Capillary 127 (H) 70 - 99 mg/dL  CBC     Status: Abnormal   Collection Time: 02/03/24  5:40 AM  Result Value Ref Range   WBC 11.5 (H) 4.0 - 10.5 K/uL   RBC 3.19 (L) 4.22 - 5.81 MIL/uL   Hemoglobin 10.0 (L) 13.0 - 17.0 g/dL   HCT 09.6 (L) 04.5 - 40.9 %   MCV 96.9 80.0 - 100.0 fL   MCH 31.3 26.0 - 34.0 pg   MCHC 32.4 30.0 - 36.0 g/dL   RDW 81.1 (H) 91.4 - 78.2 %   Platelets 272 150 - 400 K/uL   nRBC 0.0 0.0 - 0.2 %  Basic metabolic panel with GFR     Status: Abnormal   Collection Time: 02/03/24  5:40 AM  Result Value Ref Range   Sodium 134 (L) 135 - 145 mmol/L   Potassium 4.1 3.5 - 5.1 mmol/L   Chloride 103 98 -  111 mmol/L   CO2 21 (L) 22 - 32 mmol/L   Glucose, Bld 136 (H) 70 - 99 mg/dL   BUN 35 (H) 8 - 23 mg/dL   Creatinine, Ser 1.61 (H) 0.61 - 1.24 mg/dL   Calcium 8.5 (L) 8.9 - 10.3 mg/dL   GFR, Estimated 26 (L) >60 mL/min   Anion gap 10 5 - 15  Phosphorus     Status: Abnormal   Collection Time: 02/03/24  5:40 AM  Result Value Ref Range   Phosphorus 5.0 (H) 2.5 - 4.6 mg/dL  Magnesium     Status: None   Collection Time: 02/03/24  5:40 AM  Result Value Ref Range   Magnesium 2.0 1.7 - 2.4 mg/dL  Glucose, capillary     Status: Abnormal   Collection Time: 02/03/24  7:23 AM  Result Value Ref Range   Glucose-Capillary 122 (H) 70 - 99 mg/dL    Assessment & Plan: The plan of care was discussed with the bedside nurse for the day, who is in agreement with this plan and no additional concerns were raised.   Present on Admission:  Subdural hematoma (HCC)    LOS: 2 days   Additional comments:I reviewed the patient's new clinical lab test results.   and I reviewed the patients new  imaging test results.    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 Rochelle Chu  Loaded with Keppra  in ED, continue BID, F/U CT head slightly increased SDH but exam stable Bilateral rib fractures that were sustained in a fall on 01/21/2024, similar on follow-up CT scan Acute hypoxic ventilator dependent respiratory failure - sefl-extubated, doing well Foley: Placed in trauma bay, d/c FEN: N.p.o., start TF, FWF, cortrak, SLP eval VTE: PAS Dispo: txf to 4NP, PT/OT  Anda Bamberg, MD Trauma & General Surgery Please use [email or web address] to contact on call provider  02/03/2024  *Care during the described time interval was provided by me. I have reviewed this patient's available data, including medical history, events of note, physical examination and test results as part of my evaluation.

## 2024-02-03 NOTE — Progress Notes (Signed)
 Subjective: Patient reports doing a lot better and not much head pain   Objective: Vital signs in last 24 hours: Temp:  [96.6 F (35.9 C)-98.4 F (36.9 C)] 97.3 F (36.3 C) (05/30 0800) Pulse Rate:  [56-91] 91 (05/30 0800) Resp:  [12-24] 24 (05/30 0800) BP: (86-170)/(55-80) 105/63 (05/30 0800) SpO2:  [100 %] 100 % (05/30 0800) FiO2 (%):  [28 %-40 %] 28 % (05/30 0200)  Intake/Output from previous day: 05/29 0701 - 05/30 0700 In: 1490.5 [I.V.:810.9; NG/GT:669.7; IV Piggyback:10] Out: 800 [Urine:800] Intake/Output this shift: No intake/output data recorded.  Neurologic: Grossly normal  Lab Results: Lab Results  Component Value Date   WBC 11.5 (H) 02/03/2024   HGB 10.0 (L) 02/03/2024   HCT 30.9 (L) 02/03/2024   MCV 96.9 02/03/2024   PLT 272 02/03/2024   Lab Results  Component Value Date   INR 1.1 02/01/2024   BMET Lab Results  Component Value Date   NA 134 (L) 02/03/2024   K 4.1 02/03/2024   CL 103 02/03/2024   CO2 21 (L) 02/03/2024   GLUCOSE 136 (H) 02/03/2024   BUN 35 (H) 02/03/2024   CREATININE 2.57 (H) 02/03/2024   CALCIUM 8.5 (L) 02/03/2024    Studies/Results: DG Chest Port 1 View Result Date: 02/02/2024 CLINICAL DATA:  Respiratory distress.  Mental interdependence. EXAM: PORTABLE CHEST 1 VIEW COMPARISON:  01/24/2024 FINDINGS: Endotracheal tube tip is approximately 4.3 cm above the base of the carina. The NG tube passes into the stomach although the distal tip position is not included on the film. Cardiopericardial silhouette is at upper limits of normal for size. Lungs are hyperexpanded with basilar chronic atelectasis or scarring and small bilateral pleural effusions. Multiple acute right-sided rib fractures evident. No evidence for pneumothorax. Telemetry leads overlie the chest. IMPRESSION: 1. Endotracheal tube tip is approximately 4.3 cm above the base of the carina. 2. Multiple acute right-sided rib fractures. No evidence for pneumothorax. 3. Small bilateral  pleural effusions with basilar chronic atelectasis or scarring. Electronically Signed   By: Donnal Fusi M.D.   On: 02/02/2024 07:51   CT HEAD WO CONTRAST Addendum Date: 02/02/2024 ADDENDUM REPORT: 02/02/2024 06:09 ADDENDUM: Study discussed by telephone with RN Shelly on 02/02/2024 at 0552 hours. Electronically Signed   By: Marlise Simpers M.D.   On: 02/02/2024 06:09   Result Date: 02/02/2024 CLINICAL DATA:  73 year old male status post fall down multiple steps with nondepressed midline occipital skull fracture and multifocal posttraumatic intracranial hemorrhage. EXAM: CT HEAD WITHOUT CONTRAST TECHNIQUE: Contiguous axial images were obtained from the base of the skull through the vertex without intravenous contrast. RADIATION DOSE REDUCTION: This exam was performed according to the departmental dose-optimization program which includes automated exposure control, adjustment of the mA and/or kV according to patient size and/or use of iterative reconstruction technique. COMPARISON:  Head CT yesterday. FINDINGS: Brain: Isodense bilateral subdural hematoma appears increased on coronal images bilaterally, now 6-7 mm bilaterally (coronal image 28) versus 5 mm yesterday. The subdural blood also appears somewhat more holo hemispheric. Layering hyperdense subdural blood products along the posterior convexity. Small volume para falcine and tentorial blood has not significantly changed. Scattered bilateral subarachnoid hemorrhage again noted, more apparent in the right sylvian fissure now but overall volume has not significantly changed. Bifrontal hemorrhagic contusions remain a concern, although no discrete or measurable intra-axial hemorrhage at this time (series 4, image 25). And no new or progressed anterior frontal or temporal lobe edema. No midline shift. There is now trace intraventricular blood (atrium  of the right lateral ventricle series 4, image 19). No ventriculomegaly. Basilar cisterns remain patent. Subtle interval  effacement of the suprasellar cistern. Stable gray-white matter differentiation throughout the brain. No cortically based acute infarct identified. Vascular: Calcified atherosclerosis at the skull base. No suspicious intracranial vascular hyperdensity. Skull: Stable nondepressed central and right paracentral occipital skull fracture tracking to the posterior foramen magnum (series 3, image 10). Superimposed chronic severe upper cervical spine degeneration. No 2nd skull fracture is identified. Sinuses/Orbits: Intubated. Intermediate density fluid persists in the sphenoid sinuses. Other Visualized paranasal sinuses and mastoids are stable and well aerated. Other: Occipital protuberance scalp hematoma has regressed. Elsewhere stable orbit and scalp soft tissues. IMPRESSION: 1. Symmetrically increased bilateral Subdural Hematomas since yesterday, now up to 7 mm on both sides, versus 5 mm initially. 2. Bilateral subarachnoid hemorrhage not significantly changed. Trace IVH now. No ventriculomegaly. No discrete or measurable hemorrhagic contusion. 3. No midline shift. Basilar cisterns remain patent. 4. Stable nondepressed occipital skull fracture tracking to the posterior foramen magnum. Electronically Signed: By: Marlise Simpers M.D. On: 02/02/2024 05:41   CT CHEST ABDOMEN PELVIS W CONTRAST Result Date: 02/01/2024 CLINICAL DATA:  Patient fell down stairs.  Seizure. EXAM: CT CHEST, ABDOMEN, AND PELVIS WITH CONTRAST TECHNIQUE: Multidetector CT imaging of the chest, abdomen and pelvis was performed following the standard protocol during bolus administration of intravenous contrast. RADIATION DOSE REDUCTION: This exam was performed according to the departmental dose-optimization program which includes automated exposure control, adjustment of the mA and/or kV according to patient size and/or use of iterative reconstruction technique. CONTRAST:  75mL OMNIPAQUE  IOHEXOL  350 MG/ML SOLN COMPARISON:  01/21/2024 FINDINGS: CT CHEST  FINDINGS Cardiovascular: Heart is enlarged. No substantial pericardial effusion. Coronary artery calcification is evident. Mild atherosclerotic calcification is noted in the wall of the thoracic aorta. Mediastinum/Nodes: No mediastinal lymphadenopathy. There is no hilar lymphadenopathy. The esophagus has normal imaging features. There is no axillary lymphadenopathy. Lungs/Pleura: Centrilobular and paraseptal emphysema evident. No pneumothorax. Endotracheal tube tip is in the midtrachea. Dependent collapse/consolidation noted in both lower lungs with peripheral airway impaction in the posterior right lung base. Musculoskeletal: Multiple acute and nonacute right-sided rib fractures evident. Acute displaced rib fractures are seen in the lateral right third through eighth ribs and in the posterior right fourth through tenth ribs, makingthe right fourth through eighth rib fractures segmental. Multiple healed left-sided rib fractures evident. Old sternal fracture noted. Superior endplate compression deformity at T3 is stable since prior. Mild superior endplate compression deformity at T4 and T5 is also unchanged since previous. CT ABDOMEN PELVIS FINDINGS Hepatobiliary: No suspicious focal abnormality within the liver parenchyma. Pneumobilia again noted. Cholecystectomy. No intrahepatic or extrahepatic biliary dilation. Pancreas: No focal mass lesion. No dilatation of the main duct. No intraparenchymal cyst. No peripancreatic edema. Spleen: No splenomegaly. No suspicious focal mass lesion. Adrenals/Urinary Tract: No adrenal nodule or mass. Tiny well-defined homogeneous low-density lesions in both kidneys are too small to characterize but are statistically most likely benign and probably cysts. No followup imaging is recommended. No evidence for hydroureter. The urinary bladder appears normal for the degree of distention. Stomach/Bowel: NG tube tip is in the stomach. 2.4 x 2.3 cm collection of fluid and gas is seen adjacent  to the pyloric region of the stomach (image 63/3 and sagittal 60/7. This is associated with a gas collection anterior to the antrum of the stomach (delayed image 24/8). These changes appear to be related to a small bowel loop and reviewing an older study from 01/22/2023,  there appears to be a blind-ending small-bowel stump anterior to the stomach and although surgical anatomy is not clearly delineated on the current study, features suggest that the patient is status post gastrojejunostomy. Presumably excluded portion of the stomach is distended. Duodenum is normally positioned as is the ligament of Treitz. No small bowel dilatation. The terminal ileum is normal. The appendix is normal. Contrast material in the colon likely residual from the study from 8 days ago. Diverticular changes are noted in the sigmoid colon. Vascular/Lymphatic: There is moderate atherosclerotic calcification of the abdominal aorta without aneurysm. There is no gastrohepatic or hepatoduodenal ligament lymphadenopathy. No retroperitoneal or mesenteric lymphadenopathy. No pelvic sidewall lymphadenopathy. Reproductive: The prostate gland and seminal vesicles are unremarkable. Other: No free fluid around the liver. There may be some trace fluid adjacent to the posterior spleen (image 54/3) no free fluid is seen in the pelvis. Musculoskeletal: L1 compression deformity is similar to prior old fractures noted right superior and inferior pubic rami and in the right sacrum. IMPRESSION: 1. Multiple acute/subacute right-sided and nonacute bilateral rib fractures as seen on previous study from 01/21/2024. Acute/Subacute displaced rib fractures are seen in the lateral right third through eighth ribs and in the posterior right fourth through tenth ribs, making the right fourth through eighth rib fractures segmental. No pneumothorax. 2. Dependent collapse/consolidation in both lower lungs with peripheral airway impaction in the posterior right lung base.  Imaging features could be related to atelectasis or aspiration. 3. Superior endplate compression deformities at T3, T4, and T5 are stable since 01/21/2024. L1 compression deformity is also chronic and unchanged. Old right pelvic fractures again noted. 4. No acute traumatic injury identified in the abdomen or pelvis. There may be trace fluid adjacent to the posterior spleen, but this is similar to the study from 1 week ago. No free fluid in the para colic gutters or pelvis. 5. Status post gastric bypass surgery. 6. Sigmoid diverticulosis without diverticulitis. 7. Aortic Atherosclerosis (ICD10-I70.0) and Emphysema (ICD10-J43.9). Electronically Signed   By: Donnal Fusi M.D.   On: 02/01/2024 10:16   CT Head Wo Contrast Result Date: 02/01/2024 CLINICAL DATA:  73 year old male status post fall down multiple stairs. EXAM: CT HEAD WITHOUT CONTRAST TECHNIQUE: Contiguous axial images were obtained from the base of the skull through the vertex without intravenous contrast. RADIATION DOSE REDUCTION: This exam was performed according to the departmental dose-optimization program which includes automated exposure control, adjustment of the mA and/or kV according to patient size and/or use of iterative reconstruction technique. COMPARISON:  Head CT 01/21/2024. FINDINGS: Brain: Bilateral anterior frontal convexity subarachnoid hemorrhage, moderate volume (series 4, image 27). Superimposed right side anterior convexity and para falcine subdural hematoma measures 4-5 mm in thickness. Contralateral lower density left side subdural hematoma which is most visible at the vertex, 5 mm thickness there (coronal image 33). Probable bifrontal hemorrhagic contusions, although not discrete at this time. Smaller volume anterior temporal, middle cranial fossa hyperdense blood also which is probably combined subarachnoid and subdural. No intraventricular hemorrhage. No ventriculomegaly. No significant midline shift (coronal image 45). No  significant ventricular mass effect. Basilar cisterns appear stable and normal. Stable gray-white differentiation elsewhere. No cortically based acute infarct identified. Vascular: Calcified atherosclerosis at the skull base. No suspicious intracranial vascular hyperdensity. Skull: Nondepressed midline occipital protuberance fracture tracking into the posterior foramen magnum on series 3, image 11. The fracture does not appear to extend above the occipital protuberance. Severely degenerated C1-C2, craniocervical junction and the visible C1 and C2 vertebrae appear  stable compared to 01/21/2024 cervical spine CT. No other skull fracture identified. Sinuses/Orbits: Layering intermediate density fluid in the sphenoid sinuses. Intubated. Tympanic cavities and mastoids appear stable and well aerated. Other: Visualized orbit soft tissues are within normal limits. Partially visible endotracheal tube and oral enteric tube. Broad-based left occipital, suboccipital scalp hematoma. Underlying non depressed fracture through the midline occipital protuberance. Traumatic Brain Injury Risk Stratification Skull Fracture: Nondisplaced - Moderate/mBIG 2 Subdural Hematoma (SDH): 4mm to <71mm - mBIG 2.  However, bilateral. Subarachnoid Hemorrhage [facility]): multifocal, bilateral - High/mBIG 3 Epidural Hematoma (EDH): No - Low/mBIG 1 Cerebral contusion, intra-axial, intraparenchymal Hemorrhage (IPH): Suspected but occult at this time. Intraventricular Hemorrhage (IVH): No - Low/mBIG 1 Midline Shift > 1mm or Edema/effacement of sulci/vents: No - Low/mBIG 1 ---------------------------------------------------- IMPRESSION: 1. Positive for multifocal acute posttraumatic intracranial hemorrhage: - Bilateral SDH (5 mm each side). - Bilateral anterior convexity SAH (moderate), likely with bifrontal hemorrhagic contusions being obscured at this time. 2. Nondepressed midline occipital protuberance fracture tracking into the posterior foramen magnum.  Overlying scalp hematoma. 3. No significant intracranial mass effect or midline shift. No ventriculomegaly or intraventricular basilar cisterns appear normal. Study discussed by telephone with Dr. Dorena Gander on 02/01/2024 at 09:59 . Electronically Signed   By: Marlise Simpers M.D.   On: 02/01/2024 10:01   DG Pelvis Portable Result Date: 02/01/2024 CLINICAL DATA:  Trauma, fell downstairs EXAM: PORTABLE PELVIS 1-2 VIEWS COMPARISON:  01/21/2024 FINDINGS: Supine frontal view of the pelvis includes both hips. Stable subacute fractures involving the right superior and inferior pubic rami, left parasymphyseal region, and right sacral ala. I do not see any new acute displaced fractures. Stable symmetrical bilateral hip osteoarthritis and lower lumbar degenerative changes. IMPRESSION: 1. Stable subacute fractures involving the right sacral ala, right superior and inferior rami, and left parasymphyseal region as above. 2. No acute bony abnormalities. Electronically Signed   By: Bobbye Burrow M.D.   On: 02/01/2024 09:52   DG Chest Portable 1 View Result Date: 02/01/2024 CLINICAL DATA:  Trauma, fell downstairs EXAM: PORTABLE CHEST 1 VIEW COMPARISON:  01/24/2024 FINDINGS: Supine frontal view of the chest was obtained, excluding the apices and left lateral hemithorax by collimation. Endotracheal tube overlies tracheal air column, tip 4.5 cm above carina. Enteric catheter passes below diaphragm, tip excluded by collimation. Cardiac silhouette is unremarkable. There are displaced segmental right third through eighth rib fractures again noted, with small right pleural effusion slightly increased since prior study. No evidence of pneumothorax on this supine exam. Likely nondisplaced left posterior fifth rib fracture. Otherwise the left chest is clear. IMPRESSION: 1. Limited evaluation due to patient positioning. 2. Support devices as above. 3. Segmental right lateral third through eighth rib fractures unchanged since prior exam,  with small right pleural effusion slightly increased since prior exam. No evidence of pneumothorax on this supine exam. 4. Suspected nondisplaced left posterior fifth rib fracture, new since prior exam. Electronically Signed   By: Bobbye Burrow M.D.   On: 02/01/2024 09:50    Assessment/Plan: Patient doing well, extubated last night. Denies much headache. Ok to transfer out of ICU from our standpoint   LOS: 2 days    Kenard [facility] 02/03/2024, 8:31 AM

## 2024-02-03 NOTE — Progress Notes (Signed)
 Nutrition Follow-up  DOCUMENTATION CODES:   Severe malnutrition in context of chronic illness  INTERVENTION:   Initiate tube feeding via Cortrak tube: Pivot 1.5 at 30 ml/h and increase by 10 ml every 8 hours to goal rate of 50 ml/hr (1200 ml per day)  Provides 1800 kcal, 112 gm protein, 912 ml free water  daily  Continue MVI, folic acid , thiamine   200 ml free water  every 8 hours Total free water : 1512 ml   NUTRITION DIAGNOSIS:   Severe Malnutrition related to chronic illness as evidenced by severe fat depletion, severe muscle depletion. Ongoing; addressed with planned cortrak placement  GOAL:   Patient will meet greater than or equal to 90% of their needs Progressing  MONITOR:   TF tolerance  REASON FOR ASSESSMENT:   Consult Enteral/tube feeding initiation and management  ASSESSMENT:   Pt with PMH of ETOH abuse s/p 2 liver transplants admitted after fall, found at the bottom of the stairs, admitted with TBI/bilateral SDH, anterior SAH, skull fx, and bilateral rib fxs (from 5/17 fall).   Pt discussed during ICU rounds and with RN and MD. Per MD SDH stable.  Pt self extubated, failed yale with RN. Plan for MBS with SLP. Plan for cortrak placement, TF orders in chart.   5/29 - started TF which ran for 8 hours, self-extubated   Spoke with pt and his girlfriend.  Per pt he drinks a pint of vodka per day and starts around noon. Pt does not always eat meal times. Breakfast is cereal but only a couple of times a week, no lunch, dinner sometimes. He reports that he cooks a couple of times a week but girlfriend says he does not.  Pt reports a usual body weight of 150 lb. No weight hx available. Per admission weight pt is 136 lb which would be a 9% weight loss.    Medications reviewed and include: colace, folic acid , MVI with minerals, protonix , miralax , thiamine    Labs reviewed:  K 4.1 Phos 4.7 -> 5.0 Mag 2.2 -> 2.0 CBG 113-141    Diet Order:   Diet Order     None        EDUCATION NEEDS:   Not appropriate for education at this time  Skin:  Skin Assessment: Reviewed RN Assessment  Last BM:  unknown  Height:   Ht Readings from Last 1 Encounters:  02/01/24 5\' 8"  (1.727 m)    Weight:   Wt Readings from Last 1 Encounters:  02/01/24 62 kg    BMI:  Body mass index is 20.78 kg/m.  Estimated Nutritional Needs:   Kcal:  1800-2000  Protein:  95-115 grams  Fluid:  > 1.8 L/day  Randine Butcher., RD, LDN, CNSC See AMiON for contact information

## 2024-02-03 NOTE — Procedures (Signed)
 Cortrak  Person Inserting Tube:  [name], [name], RD Tube Type:  Cortrak - 43 inches Tube Size:  10 Tube Location:  Left nare Initial Placement:  Stomach Secured by: Bridle Technique Used to Measure Tube Placement:  Marking at nare/corner of mouth Cortrak Secured At:  72 cm   Cortrak Tube Team Note:  Consult received to place a Cortrak feeding tube.   X-ray is required. RN may begin using tube once tube placement confirmed.   If the tube becomes dislodged please keep the tube and contact the Cortrak team at [email or web address] for replacement.  If after hours and replacement cannot be delayed, place a NG tube and confirm placement with an abdominal x-ray.    [name], RD Registered Dietitian  See Amion for more information

## 2024-02-03 NOTE — Progress Notes (Signed)
 At approximately 0150 this morning the patient self extubated. Patient was able to cough and verbalize. Trauma MD was paged and notified of the situation. The patient is stable on 3L of [place] with O2 sat of 100%.Fentayl gtt and precedex  turned off, the tube feeding was also turned off as the OG was also pulled out. Patient was oriented as to where he was and what happened and he asked "if he was going to be ok".Will continue to monitor.

## 2024-02-03 NOTE — Progress Notes (Signed)
 Modified Barium Swallow Study  Patient Details  Name: [name] MRN: [id number] Date of Birth: 04/14/1951  Today's Date: 02/03/2024  Modified Barium Swallow completed.  Full report located under Chart Review in the Imaging Section.  History of Present Illness [name] is a 73 yo male presenting to ED 5/28 after being found seizing after falling down stairs. CTH shows bilateral SDH and SAH with trace IVH and non-depressed occipital skull fx. Intubated 5/28, self-extubated 5/30. Recently sustained bilateral rib fxs after a fall 5/17. PMH includes ETOH abuse s/p two liver transplants   Clinical Impression Pt exhibits moderate pharyngeal dysphagia characterized by reduced laryngeal elevation and absent epiglottic deflection, which is suspected to be acute on chronic. Suspected cervical osteophytes contribute to a collection of vallecular and pyriform sinus residue, which is only partially cleared by cued subswallows. Thin liquids reach the vocal folds during the swallow and are unable to be ejected (PAS 5). More airway invasion occurs after the swallow as a result of the collection of residue. As the penetrates sit on the vocal folds during subsequent trials, they are eventually aspirated without sensation (PAS 8). Thicker consistencies allow more time for pt to achieve laryngeal closure, reducing instances of penetration. As residue mixes within the valleculae and pyriform sinuses, it is subsequently more difficult to discern what consistency enters the laryngeal vestibule after the swallow. Suspect nectar thick liquids reach the vocal folds but are ejected (PAS 4). While honey thick liquids do not result in penetration, they increase residue substantially. A chin tuck position (with thin and nectar thick liquids) and a R head turn (with nectar thick liquids) did not yield functional improvement. Although eructation is persistent, the esophageal sweep did not reveal any significant  retention or retrograde flow. Recommending Dys 2 diet and nectar thick liquids with full supervision to cue pt to swallow multiple times and cough intermittently. Of note, pt's nose bled in small amounts two separate times throughout the study. RN notified. SLP will continue following with consideration of initiating RMT to strengthen pt's cough after intubation. Factors that may increase risk of adverse event in presence of aspiration [name] & [name] 2021): Reduced cognitive function;Frail or deconditioned;Inadequate oral hygiene;Weak cough  Swallow Evaluation Recommendations Recommendations: PO diet PO Diet Recommendation: Dysphagia 2 (Finely chopped);Mildly thick liquids (Level 2, nectar thick) Liquid Administration via: Cup;Straw Medication Administration: Crushed with puree Supervision: Full assist for feeding;Full supervision/cueing for swallowing strategies Swallowing strategies  : Minimize environmental distractions;Slow rate;Small bites/sips;Multiple dry swallows after each bite/sip;Hard cough after swallowing Postural changes: Position pt fully upright for meals;Stay upright 30-60 min after meals Oral care recommendations: Oral care QID (4x/day);Oral care before PO Caregiver Recommendations: Avoid jello, ice cream, thin soups, popsicles;Remove water  pitcher    [name], M.A., CCC-SLP Speech Language Pathology, Acute Rehabilitation Services  Secure Chat preferred [phone]  02/03/2024,3:27 PM

## 2024-02-03 NOTE — Evaluation (Signed)
 Clinical/Bedside Swallow Evaluation Patient Details  Name: [name] MRN: [id number] Date of Birth: 27-Apr-1951  Today's Date: 02/03/2024 Time: SLP Start Time (ACUTE ONLY): 1140 SLP Stop Time (ACUTE ONLY): 1153 SLP Time Calculation (min) (ACUTE ONLY): 13 min  Past Medical History: History reviewed. No pertinent past medical history. Past Surgical History: History reviewed. No pertinent surgical history. HPI:  [name] is a 73 yo male presenting to ED 5/28 after being found seizing after falling down stairs. CTH shows bilateral SDH and SAH with trace IVH and non-depressed occipital skull fx. Intubated 5/28, self-extubated 5/30. Recently sustained bilateral rib fxs after a fall 5/17. PMH includes ETOH abuse s/p two liver transplants    Assessment / Plan / Recommendation  Clinical Impression  Pt presents with a hoarse vocal quality and weak cough after self-extubating over night. Per RN, he failed the swallow screen due to coughing and stopping. He performed oral care with set up assistance. Trials of thin liquids consistently result in isolated throat clearance of coughing and pt could not be challenged to take sequential sips. Oral transit and clearance appeared generally functional with purees and solids. There is frequent eructation following the swallow but pt denies a significant esophageal history (suspect history of EtOH use may contribute to reflux symptoms). Will proceed with an MBS given clinical presentation. Pending results, recommend he remain NPO except ice chips after oral care and meds crushed with pure. Will f/u. SLP Visit Diagnosis: Dysphagia, unspecified (R13.10)    Aspiration Risk  Mild aspiration risk    Diet Recommendation NPO except meds;Ice chips PRN after oral care    Medication Administration: Crushed with puree    Other  Recommendations Oral Care Recommendations: Oral care QID;Oral care prior to ice chip/H20    Recommendations for follow up therapy  are one component of a multi-disciplinary discharge planning process, led by the attending physician.  Recommendations may be updated based on patient status, additional functional criteria and insurance authorization.  Follow up Recommendations Acute inpatient rehab (3hours/day)      Assistance Recommended at Discharge    Functional Status Assessment Patient has had a recent decline in their functional status and demonstrates the ability to make significant improvements in function in a reasonable and predictable amount of time.  Frequency and Duration min 2x/week  2 weeks       Prognosis Prognosis for improved oropharyngeal function: Good Barriers to Reach Goals: Cognitive deficits      Swallow Study   General HPI: [name] is a 73 yo male presenting to ED 5/28 after being found seizing after falling down stairs. CTH shows bilateral SDH and SAH with trace IVH and non-depressed occipital skull fx. Intubated 5/28, self-extubated 5/30. Recently sustained bilateral rib fxs after a fall 5/17. PMH includes ETOH abuse s/p two liver transplants Type of Study: Bedside Swallow Evaluation Previous Swallow Assessment: none in chart Diet Prior to this Study: NPO Temperature Spikes Noted: No Respiratory Status: Room air History of Recent Intubation: Yes Total duration of intubation (days): 3 days Date extubated: 02/03/24 (self-extubated) Behavior/Cognition: Alert;Cooperative;Requires cueing Oral Cavity Assessment: Within Functional Limits Oral Care Completed by SLP: Yes Oral Cavity - Dentition: Missing dentition;Poor condition Vision: Functional for self-feeding Self-Feeding Abilities: Able to feed self Patient Positioning: Upright in bed Baseline Vocal Quality: Hoarse Volitional Cough: Weak Volitional Swallow: Able to elicit    Oral/Motor/Sensory Function Overall Oral Motor/Sensory Function: Within functional limits   Ice Chips Ice chips: Not tested   Thin Liquid Thin Liquid:  Impaired  Presentation: Straw;Self Fed Pharyngeal  Phase Impairments: Throat Clearing - Immediate;Cough - Immediate    Nectar Thick Nectar Thick Liquid: Not tested   Honey Thick Honey Thick Liquid: Not tested   Puree Puree: Within functional limits Presentation: Spoon;Self Fed   Solid     Solid: Within functional limits Presentation: Self Fed      [name], M.A., CCC-SLP Speech Language Pathology, Acute Rehabilitation Services  Secure Chat preferred [phone]  02/03/2024,12:37 PM

## 2024-02-04 LAB — GLUCOSE, CAPILLARY
Glucose-Capillary: 110 mg/dL — ABNORMAL HIGH (ref 70–99)
Glucose-Capillary: 111 mg/dL — ABNORMAL HIGH (ref 70–99)
Glucose-Capillary: 116 mg/dL — ABNORMAL HIGH (ref 70–99)
Glucose-Capillary: 106 mg/dL — ABNORMAL HIGH (ref 70–99)
Glucose-Capillary: 100 mg/dL — ABNORMAL HIGH (ref 70–99)
Glucose-Capillary: 98 mg/dL (ref 70–99)

## 2024-02-04 LAB — MAGNESIUM: Magnesium: 2.2 mg/dL (ref 1.7–2.4)

## 2024-02-04 LAB — PHOSPHORUS: Phosphorus: 2.8 mg/dL (ref 2.5–4.6)

## 2024-02-04 LAB — CBC
HCT: 26.2 % — ABNORMAL LOW (ref 39.0–52.0)
Hemoglobin: 8.4 g/dL — ABNORMAL LOW (ref 13.0–17.0)
MCH: 31.2 pg (ref 26.0–34.0)
MCHC: 32.1 g/dL (ref 30.0–36.0)
MCV: 97.4 fL (ref 80.0–100.0)
Platelets: 309 10*3/uL (ref 150–400)
RBC: 2.69 MIL/uL — ABNORMAL LOW (ref 4.22–5.81)
RDW: 17 % — ABNORMAL HIGH (ref 11.5–15.5)
WBC: 9.8 10*3/uL (ref 4.0–10.5)
nRBC: 0 % (ref 0.0–0.2)

## 2024-02-04 LAB — BASIC METABOLIC PANEL WITH GFR
Anion gap: 11 (ref 5–15)
BUN: 51 mg/dL — ABNORMAL HIGH (ref 8–23)
CO2: 21 mmol/L — ABNORMAL LOW (ref 22–32)
Calcium: 8.2 mg/dL — ABNORMAL LOW (ref 8.9–10.3)
Chloride: 99 mmol/L (ref 98–111)
Creatinine, Ser: 2.87 mg/dL — ABNORMAL HIGH (ref 0.61–1.24)
GFR, Estimated: 23 mL/min — ABNORMAL LOW (ref >60)
Glucose, Bld: 121 mg/dL — ABNORMAL HIGH (ref 70–99)
Potassium: 4 mmol/L (ref 3.5–5.1)
Sodium: 131 mmol/L — ABNORMAL LOW (ref 135–145)

## 2024-02-04 MED ORDER — PIVOT 1.5 CAL PO LIQD
1200.0000 mL | ORAL | Status: DC
  Administered 2024-02-04: 1000 mL
  Administered 2024-02-05: 1200 mL
  Administered 2024-02-06 – 2024-02-07 (×2): 1000 mL
  Filled 2024-02-04 (×4): qty 2000

## 2024-02-04 MED ORDER — HEPARIN SODIUM (PORCINE) 5000 UNIT/ML IJ SOLN
5000.0000 [IU] | Freq: Three times a day (TID) | INTRAMUSCULAR | Status: DC
  Administered 2024-02-04 – 2024-03-06 (×93): 5000 [IU] via SUBCUTANEOUS
  Filled 2024-02-04 (×94): qty 1

## 2024-02-04 MED ORDER — ENSURE PLUS HIGH PROTEIN PO LIQD
237.0000 mL | Freq: Three times a day (TID) | ORAL | Status: DC
  Administered 2024-02-04 – 2024-02-10 (×5): 237 mL via ORAL

## 2024-02-04 MED ORDER — ENOXAPARIN SODIUM 30 MG/0.3ML IJ SOSY: 30.0000 mg | PREFILLED_SYRINGE | Freq: Two times a day (BID) | INTRAMUSCULAR | Status: DC

## 2024-02-04 NOTE — Progress Notes (Signed)
 NEUROSURGERY PROGRESS NOTE  Doing well. No headaches. Resting comfortably  Temp:  [97.7 F (36.5 C)-98.6 F (37 C)] 97.9 F (36.6 C) (05/31 0355) Pulse Rate:  [82-117] 109 (05/31 0539) Resp:  [10-21] 20 (05/31 0539) BP: (96-122)/(59-77) 119/73 (05/31 0750) SpO2:  [90 %-100 %] 94 % (05/31 0539) Weight:  [63.2 kg] 63.2 kg (05/31 0500)  Plan: S/p CHI, doing well. Ok to start DVT prophylaxis now.  Jeannette Mills, NP 02/04/2024 8:25 AM

## 2024-02-04 NOTE — Progress Notes (Signed)
 Inpatient Rehab Admissions:  Inpatient Rehab Consult received.  I met with patient at the bedside for rehabilitation assessment and to discuss goals and expectations of an inpatient rehab admission.  Pt appeared somewhat confused/having difficulty processing what AC was discussing. Pt gave permission to contact significant other Rice Chamorro. Explained CIR goals and expectations. She acknowledged understanding. She informed AC that she wouldn't be able to take care of pt after discharge. She prefers a long-term placement. TOC made aware. AC will sign off.  Signed: Artemus Larsen, MS, CCC-SLP Admissions Coordinator [phone]

## 2024-02-04 NOTE — Progress Notes (Signed)
 Inpatient Rehab Admissions Coordinator Note:   Per therapy patient was screened for CIR candidacy by Trayvion Embleton Annell Barrow, CCC-SLP. At this time, pt appears to be a potential candidate for CIR. I will place an order for rehab consult for full assessment, per our protocol.  Please contact me any with questions.Artemus Larsen, MS, CCC-SLP Admissions Coordinator [phone] 02/04/24 1:45 PM

## 2024-02-04 NOTE — Evaluation (Signed)
 Speech Language Pathology Evaluation Patient Details Name: [name] MRN: [id number] DOB: 29-Dec-1950 Today's Date: 02/04/2024 Time: 6213-0865 SLP Time Calculation (min) (ACUTE ONLY): 10 min  Problem List:  Patient Active Problem List   Diagnosis Date Noted   Subdural hematoma (HCC) 02/01/2024   Past Medical History: History reviewed. No pertinent past medical history. Past Surgical History: History reviewed. No pertinent surgical history. HPI:  [name] is a 73 yo male presenting to ED 5/28 after being found seizing after falling down stairs. CTH shows bilateral SDH and SAH with trace IVH and non-depressed occipital skull fx. Intubated 5/28, self-extubated 5/30. Recently sustained bilateral rib fxs after a fall 5/17. PMH includes ETOH abuse s/p two liver transplants   Assessment / Plan / Recommendation Clinical Impression  Pt was lethargic at the time of this evaluation, which is suspected to have impacted his performance. Portions of the Cognistat were administered with pt exhibiting deficits related to orientation, memory, focused attention, problem solving, and reasoning. He was disoriented to time, stating he is 71 as well as apsects of location. His attention was fleeting at times which was likely additionally affected by level of alertness. Areas of strength include auditory comprehension and expressive language. Overall, pt's presentation is suspected to be different from baseline and he may benefit from ongoing SLP f/u to target deficits listed above. Will continue following as lethargy allows.    SLP Assessment  SLP Recommendation/Assessment: Patient needs continued Speech Lanaguage Pathology Services SLP Visit Diagnosis: Cognitive communication deficit (R41.841)    Recommendations for follow up therapy are one component of a multi-disciplinary discharge planning process, led by the attending physician.  Recommendations may be updated based on patient status,  additional functional criteria and insurance authorization.    Follow Up Recommendations  Acute inpatient rehab (3hours/day)    Assistance Recommended at Discharge  Frequent or constant Supervision/Assistance  Functional Status Assessment Patient has had a recent decline in their functional status and demonstrates the ability to make significant improvements in function in a reasonable and predictable amount of time.  Frequency and Duration min 2x/week  2 weeks      SLP Evaluation Cognition  Overall Cognitive Status: Impaired/Different from baseline Arousal/Alertness: Lethargic Orientation Level: Oriented X4 Attention: Focused Focused Attention: Impaired Focused Attention Impairment: Verbal basic;Functional basic Memory: Impaired Memory Impairment: Storage deficit;Retrieval deficit Awareness: Impaired Awareness Impairment: Emergent impairment Problem Solving: Impaired Problem Solving Impairment: Verbal basic Executive Function: Reasoning Reasoning: Impaired Reasoning Impairment: Verbal basic       Comprehension  Auditory Comprehension Overall Auditory Comprehension: Appears within functional limits for tasks assessed    Expression Expression Primary Mode of Expression: Verbal Verbal Expression Overall Verbal Expression: Appears within functional limits for tasks assessed   Oral / Motor  Oral Motor/Sensory Function Overall Oral Motor/Sensory Function: Within functional limits Motor Speech Overall Motor Speech: Appears within functional limits for tasks assessed            [name], M.A., CCC-SLP Speech Language Pathology, Acute Rehabilitation Services  Secure Chat preferred [phone]  02/04/2024, 10:23 AM

## 2024-02-04 NOTE — Plan of Care (Signed)
  Problem: Nutrition: Goal: Adequate nutrition will be maintained Outcome: Progressing   Problem: Coping: Goal: Level of anxiety will decrease Outcome: Progressing   Problem: Elimination: Goal: Will not experience complications related to bowel motility Outcome: Progressing   Problem: Pain Managment: Goal: General experience of comfort will improve and/or be controlled Outcome: Progressing

## 2024-02-04 NOTE — Progress Notes (Signed)
 Nutrition Follow-up  DOCUMENTATION CODES:   Severe malnutrition in context of chronic illness  INTERVENTION:   -Transition to nocturnal feedings:  Pivot 1.5 @ 75 ml/hr x 16 hours   200 ml free water  flush every 8 hours  Tube feeding regimen provides 1800 kcal (100% of needs), 113 grams of protein, and 900 ml of H2O.  Total free water : 1600 ml daily  -Continue 100 mg thiamine  daily -Continue MVI with minerals daily -Continue 1 mg folic acid  daily -Ensure Plus High Protein po TID, each supplement provides 350 kcal and 20 grams of protein.   NUTRITION DIAGNOSIS:   Severe Malnutrition related to chronic illness as evidenced by severe fat depletion, severe muscle depletion.  Ongoing  GOAL:   Patient will meet greater than or equal to 90% of their needs  Met with TF  MONITOR:   TF tolerance  REASON FOR ASSESSMENT:   Consult Enteral/tube feeding initiation and management  ASSESSMENT:   Pt with PMH of ETOH abuse s/p 2 liver transplants admitted after fall, found at the bottom of the stairs, admitted with TBI/bilateral SDH, anterior SAH, skull fx, and bilateral rib fxs (from 5/17 fall).  5/30- cortrak placed (gastric), s/p MBSS- dysphagia 2 diet with nectar thick liquids 5/31- s/p BSE- dysphagia 2 diet with nectar thick liquids  Reviewed I/O's: -75 ml x 24 hours and +1.3 L since admission  UOP: 950 ml x 24 hours  Pt unavailable at time of visit. Attempted to speak with pt via call to hospital room phone, however, unable to reach.   Case discussed with RN, who reports that pt is tolerating TF well. He is receiving Pivot 1.5 at goal rate of 50 ml/hr. Per RN, he is not interested in eating solid foods, but drinks well. RN thinks pt will drink a supplement if offered.   General surgery requesting transition to nocturnal feedings.   Wt has been stable since admission.   CIR following for potential admission.  Medications reviewed and include thiamine , prograf ,  miralax , MVI, ativan , keppra , colace, and folic acid .   Labs reviewed: Na: 131, CBGS: 110-116 (inpatient orders for glycemic control are none).    Diet Order:   Diet Order             DIET DYS 2 Room service appropriate? Yes with Assist; Fluid consistency: Nectar Thick  Diet effective now                   EDUCATION NEEDS:   Not appropriate for education at this time  Skin:  Skin Assessment: Reviewed RN Assessment  Last BM:  unknown  Height:   Ht Readings from Last 1 Encounters:  02/01/24 5\' 8"  (1.727 m)    Weight:   Wt Readings from Last 1 Encounters:  02/04/24 63.2 kg   BMI:  Body mass index is 21.19 kg/m.  Estimated Nutritional Needs:   Kcal:  1800-2000  Protein:  95-115 grams  Fluid:  > 1.8 L/day    Herschel Lords, RD, LDN, CDCES Registered Dietitian III Certified Diabetes Care and Education Specialist If unable to reach this RD, please use "RD Inpatient" group chat on secure chat between hours of 8am-4 pm daily

## 2024-02-04 NOTE — Progress Notes (Signed)
 Subjective: CC: A&O x 3 (thought he was at [facility]).  No complaints.  Passed for D2 diet. Has not taken in any po intake yet. Tolerating tf's. No n/v. BM yesterday.  Voiding after foley removal.   Afebrile. HR 109 on last vitals. No hypotension. On RA. Last CIWA score this am was 0.   Objective: Vital signs in last 24 hours: Temp:  [97.7 F (36.5 C)-98.6 F (37 C)] 97.9 F (36.6 C) (05/31 0355) Pulse Rate:  [82-117] 109 (05/31 0539) Resp:  [10-21] 20 (05/31 0539) BP: (96-122)/(59-77) 119/73 (05/31 0750) SpO2:  [90 %-100 %] 94 % (05/31 0539) Weight:  [63.2 kg] 63.2 kg (05/31 0500) Last BM Date : 02/03/24  Intake/Output from previous day: 05/30 0701 - 05/31 0700 In: 1356.7 [NG/GT:1346.7; IV Piggyback:10] Out: 1432 [Urine:950; Emesis/NG output:482] Intake/Output this shift: No intake/output data recorded.  PE: Gen:  Alert, NAD, pleasant HEENT: EOM's intact, pupils equal and round Card:  RRR Pulm:  CTAB, no W/R/R, effort normal Abd: Soft, ND, NT Ext:  No LE edema  Psych: A&Ox3  Neuro: Cn 3-12 grossly intact, mae's, f/c, non-focal  Lab Results:  Recent Labs    02/03/24 0540 02/04/24 0648  WBC 11.5* 9.8  HGB 10.0* 8.4*  HCT 30.9* 26.2*  PLT 272 309   BMET Recent Labs    02/02/24 0436 02/03/24 0540  NA 133* 134*  K 4.0 4.1  CL 103 103  CO2 18* 21*  GLUCOSE 97 136*  BUN 32* 35*  CREATININE 2.79* 2.57*  CALCIUM 8.1* 8.5*   PT/INR Recent Labs    02/01/24 1000  LABPROT 14.0  INR 1.1   CMP     Component Value Date/Time   NA 134 (L) 02/03/2024 0540   K 4.1 02/03/2024 0540   CL 103 02/03/2024 0540   CO2 21 (L) 02/03/2024 0540   GLUCOSE 136 (H) 02/03/2024 0540   BUN 35 (H) 02/03/2024 0540   CREATININE 2.57 (H) 02/03/2024 0540   CALCIUM 8.5 (L) 02/03/2024 0540   PROT 5.6 (L) 02/01/2024 1000   ALBUMIN 2.4 (L) 02/01/2024 1000   AST 23 02/01/2024 1000   ALT 14 02/01/2024 1000   ALKPHOS 114 02/01/2024 1000   BILITOT 0.7 02/01/2024 1000   GFRNONAA  26 (L) 02/03/2024 0540   Lipase  No results found for: "LIPASE"  Studies/Results: DG Abd Portable 1V Result Date: 02/03/2024 CLINICAL DATA:  Feeding tube placement. EXAM: PORTABLE ABDOMEN - 1 VIEW COMPARISON:  None Available. FINDINGS: Weighted enteric tube with tip and side-port below the diaphragm in the stomach. The tip is directed cranially in the region of the gastric cardia. There is barium within the stomach and scattered small bowel. IMPRESSION: Weighted enteric tube with tip and side-port below the diaphragm in the stomach. The tip is directed cranially in the region of the gastric cardia. Electronically Signed   By: Chadwick Colonel M.D.   On: 02/03/2024 17:01   DG Swallowing Func-Speech Pathology Result Date: 02/03/2024 Modified Barium Swallow Study Patient Details Name: [name] MRN: [id number] Date of Birth: 01/01/1951 Today's Date: 02/03/2024 HPI/PMH: HPI: [name] is a 73 yo male presenting to ED 5/28 after being found seizing after falling down stairs. CTH shows bilateral SDH and SAH with trace IVH and non-depressed occipital skull fx. Intubated 5/28, self-extubated 5/30. Recently sustained bilateral rib fxs after a fall 5/17. PMH includes ETOH abuse s/p two liver transplants Clinical Impression: Clinical Impression: Pt exhibits moderate pharyngeal dysphagia characterized by  reduced laryngeal elevation and absent epiglottic deflection, which is suspected to be acute on chronic. Suspected cervical osteophytes contribute to a collection of vallecular and pyriform sinus residue, which is only partially cleared by cued subswallows. Thin liquids reach the vocal folds during the swallow and are unable to be ejected (PAS 5). More airway invasion occurs after the swallow as a result of the collection of residue. As the penetrates sit on the vocal folds during subsequent trials, they are eventually aspirated without sensation (PAS 8). Thicker consistencies allow more time for pt to  achieve laryngeal closure, reducing instances of penetration. As residue mixes within the valleculae and pyriform sinuses, it is subsequently more difficult to discern what consistency enters the laryngeal vestibule after the swallow. Suspect nectar thick liquids reach the vocal folds but are ejected (PAS 4). While honey thick liquids do not result in penetration, they increase residue substantially. A chin tuck position (with thin and nectar thick liquids) and a R head turn (with nectar thick liquids) did not yield functional improvement. Although eructation is persistent, the esophageal sweep did not reveal any significant retention or retrograde flow. Recommending Dys 2 diet and nectar thick liquids with full supervision to cue pt to swallow multiple times and cough intermittently. Of note, pt's nose bled in small amounts two separate times throughout the study. RN notified. SLP will continue following with consideration of initiating RMT to strengthen pt's cough after intubation. Factors that may increase risk of adverse event in presence of aspiration [name] & [name] 2021): Factors that may increase risk of adverse event in presence of aspiration [name] & [name] 2021): Reduced cognitive function; Frail or deconditioned; Inadequate oral hygiene; Weak cough Recommendations/Plan: Swallowing Evaluation Recommendations Swallowing Evaluation Recommendations Recommendations: PO diet PO Diet Recommendation: Dysphagia 2 (Finely chopped); Mildly thick liquids (Level 2, nectar thick) Liquid Administration via: Cup; Straw Medication Administration: Crushed with puree Supervision: Full assist for feeding; Full supervision/cueing for swallowing strategies Swallowing strategies  : Minimize environmental distractions; Slow rate; Small bites/sips; Multiple dry swallows after each bite/sip; Hard cough after swallowing Postural changes: Position pt fully upright for meals; Stay upright 30-60 min after meals Oral care  recommendations: Oral care QID (4x/day); Oral care before PO Caregiver Recommendations: Avoid jello, ice cream, thin soups, popsicles; Remove water  pitcher Treatment Plan Treatment Plan Treatment recommendations: Therapy as outlined in treatment plan below Follow-up recommendations: Acute inpatient rehab (3 hours/day) Functional status assessment: Patient has had a recent decline in their functional status and demonstrates the ability to make significant improvements in function in a reasonable and predictable amount of time. Treatment frequency: Min 2x/week Treatment duration: 2 weeks Interventions: Aspiration precaution training; Compensatory techniques; Patient/family education; Trials of upgraded texture/liquids; Respiratory muscle strength training Recommendations Recommendations for follow up therapy are one component of a multi-disciplinary discharge planning process, led by the attending physician.  Recommendations may be updated based on patient status, additional functional criteria and insurance authorization. Assessment: Orofacial Exam: Orofacial Exam Oral Cavity: Oral Hygiene: WFL Oral Cavity - Dentition: Missing dentition; Poor condition Orofacial Anatomy: WFL Oral Motor/Sensory Function: WFL Anatomy: Anatomy: Suspected cervical osteophytes Boluses Administered: Boluses Administered Boluses Administered: Thin liquids (Level 0); Mildly thick liquids (Level 2, nectar thick); Moderately thick liquids (Level 3, honey thick); Puree  Oral Impairment Domain: Oral Impairment Domain Lip Closure: Interlabial escape, no progression to anterior lip Tongue control during bolus hold: Cohesive bolus between tongue to palatal seal Bolus preparation/mastication: Timely and efficient chewing and mashing Bolus transport/lingual motion: Brisk tongue motion Oral  residue: Complete oral clearance Location of oral residue : N/A Initiation of pharyngeal swallow : Posterior angle of the ramus  Pharyngeal Impairment Domain:  Pharyngeal Impairment Domain Soft palate elevation: No bolus between soft palate (SP)/pharyngeal wall (PW) Laryngeal elevation: Partial superior movement of thyroid cartilage/partial approximation of arytenoids to epiglottic petiole Anterior hyoid excursion: Partial anterior movement Epiglottic movement: No inversion Laryngeal vestibule closure: Incomplete, narrow column air/contrast in laryngeal vestibule Pharyngeal stripping wave : Present - diminished Pharyngeal contraction (A/P view only): N/A Pharyngoesophageal segment opening: Partial distention/partial duration, partial obstruction of flow Tongue base retraction: Trace column of contrast or air between tongue base and PPW Pharyngeal residue: Collection of residue within or on pharyngeal structures Location of pharyngeal residue: Valleculae; Pyriform sinuses  Esophageal Impairment Domain: Esophageal Impairment Domain Esophageal clearance upright position: Complete clearance, esophageal coating Pill: No data recorded Penetration/Aspiration Scale Score: Penetration/Aspiration Scale Score 1.  Material does not enter airway: Moderately thick liquids (Level 3, honey thick); Puree 4.  Material enters airway, CONTACTS cords then ejected out: Mildly thick liquids (Level 2, nectar thick) 8.  Material enters airway, passes BELOW cords without attempt by patient to eject out (silent aspiration) : Thin liquids (Level 0) Compensatory Strategies: Compensatory Strategies Compensatory strategies: Yes Straw: Ineffective Ineffective Straw: Thin liquid (Level 0); Mildly thick liquid (Level 2, nectar thick) Multiple swallows: Effective Effective Multiple Swallows: Mildly thick liquid (Level 2, nectar thick); Moderately thick liquid (Level 3, honey thick); Puree Chin tuck: Ineffective Ineffective Chin Tuck: Thin liquid (Level 0); Mildly thick liquid (Level 2, nectar thick) Right head turn: Ineffective Ineffective Right Head Turn: Mildly thick liquid (Level 2, nectar thick)    General Information: Caregiver present: No  Diet Prior to this Study: NPO   Temperature : Normal   Respiratory Status: WFL   Supplemental O2: None (Room air)   History of Recent Intubation: Yes  Behavior/Cognition: Alert; Cooperative; Requires cueing Self-Feeding Abilities: Needs assist with self-feeding Baseline vocal quality/speech: Dysphonic Volitional Cough: Able to elicit Volitional Swallow: Able to elicit Exam Limitations: No limitations Goal Planning: Prognosis for improved oropharyngeal function: Good Barriers to Reach Goals: Cognitive deficits; Time post onset No data recorded Patient/Family Stated Goal: none stated Consulted and agree with results and recommendations: Patient Pain: Pain Assessment Pain Assessment: No/denies pain Facial Expression: 0 Body Movements: 0 Muscle Tension: 0 Compliance with ventilator (intubated pts.): 0 Vocalization (extubated pts.): N/A CPOT Total: 0 End of Session: Start Time:SLP Start Time (ACUTE ONLY): 1425 Stop Time: SLP Stop Time (ACUTE ONLY): 1445 Time Calculation:SLP Time Calculation (min) (ACUTE ONLY): 20 min Charges: SLP Evaluations $ SLP Speech Visit: 1 Visit SLP Evaluations $BSS Swallow: 1 Procedure $MBS Swallow: 1 Procedure SLP visit diagnosis: SLP Visit Diagnosis: Dysphagia, pharyngeal phase (R13.13) Past Medical History: No past medical history on file. Past Surgical History: No past surgical history on file. 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 Rochelle Chu  Loaded with Keppra  in ED, continue BID, F/U CT head slightly increased SDH but exam stable. TBI therapies.  Bilateral rib fractures that were sustained in a  fall on 01/21/2024, similar on follow-up CT scan - multimodal pain control, pulm toilet Acute hypoxic ventilator dependent respiratory failure - self-extubated, doing well, weaned to RA Etoh -  CIWA and spiritus frumenti. Multi, thiamine , folic acid .  FEN: D2 diet per SLP. Nocturnal TF's via cortrak. FWF VTE: PAS, discussed w/ NSGY - okay to start chem ppx today - sqh started ID: None Foley: Placed in trauma bay, out, spont void Dispo: 4NP, TBI therapies.   I reviewed nursing notes, Consultant (NSGY) notes, last 24 h vitals and pain scores, last 48 h intake and output, last 24 h labs and trends, and last 24 h imaging results. Discussed w/ NSGY in person.    LOS: 3 days    Delton Filbert, [facility] 02/04/2024, 8:01 AM Please see Amion for pager number during day hours 7:00am-4:30pm

## 2024-02-04 NOTE — Evaluation (Signed)
 Occupational Therapy Evaluation Patient Details Name: [name] MRN: [id number] DOB: June 11, 1951 Today's Date: 02/04/2024   History of Present Illness   73 yo male presenting to ED 5/28 after being found seizing after falling down stairs. CTH bil SDH and SAH with trace IVH and non-depressed occipital skull fx. Intubated 5/28, self-extubated 5/30. Recently sustained bilateral rib fxs after a fall 5/17. Imaging also showed stable subacute fractures involving the right sacral ala, right superior and inferior rami, and left parasymphyseal region along with stable superior endplate compression deformities at T3, T4, and T5 since 01/21/2024. PMH includes ETOH abuse s/p two liver transplants, 5/17 fall with R sacral ala superior and inferior rami fx, L superior endplate compression deformities at T3 T4 T5     Clinical Impressions PT admitted with [facility] TBI with occipital skull fx. Pt currently with functional limitiations due to the deficits listed below (see OT problem list). Pt at baseline indep as pt is from home with recent hx of falls. No family present at this time and pt is a poor historian. Pt will benefit from skilled OT to increase their independence and safety with adls and balance to allow discharge Patient will benefit from intensive inpatient follow-up therapy, >3 hours/day .      If plan is discharge home, recommend the following:   Two people to help with walking and/or transfers;Two people to help with bathing/dressing/bathroom     Functional Status Assessment   Patient has had a recent decline in their functional status and demonstrates the ability to make significant improvements in function in a reasonable and predictable amount of time.     Equipment Recommendations   Other (comment) (TBA- based on this session would need w/c cushion, bsc and lift equipment or 2 person (A))     Recommendations for Other Services   Rehab consult      Precautions/Restrictions   Precautions Precautions: Fall;Other (comment) Precaution/Restrictions Comments: cortrak Restrictions Weight Bearing Restrictions Per Provider Order: No Other Position/Activity Restrictions: Dr. Aniceto Barley confirmed no spinal precautions or weight bearing restrictions 5/31     Mobility Bed Mobility Overal bed mobility: Needs Assistance Bed Mobility: Supine to Sit     Supine to sit: +2 for physical assistance, +2 for safety/equipment, HOB elevated, Max assist     General bed mobility comments: Pt demonstrated poor comprehension to move his legs off the R EOB, often moving them to the R then putting them back to the L on the bed when cued. Pt required maxAx2 to move and direct the legs, pivot the hips, and ascend his trunk. He did demonstrate initiation to push through his arms to assist with his trunk.    Transfers Overall transfer level: Needs assistance Equipment used: 2 person hand held assist Transfers: Sit to/from Stand, Bed to chair/wheelchair/BSC Sit to Stand: Mod assist, +2 physical assistance, +2 safety/equipment     Step pivot transfers: Mod assist, +2 physical assistance, +2 safety/equipment     General transfer comment: Pt required modAx2 to power up to stand and gain balance then step pivot to R from EOB to recliner with bil HHA. PT drags his bil feet, L more than R.      Balance Overall balance assessment: Needs assistance Sitting-balance support: No upper extremity supported, Feet supported Sitting balance-Leahy Scale: Fair Sitting balance - Comments: static sitting EOB with CGA for safety   Standing balance support: Bilateral upper extremity supported, During functional activity Standing balance-Leahy Scale: Poor Standing balance comment: reliant on bil UE support  and modAx2                           ADL either performed or assessed with clinical judgement   ADL Overall ADL's : Needs  assistance/impaired Eating/Feeding: Minimal assistance Eating/Feeding Details (indicate cue type and reason): pt requires thicken liquids and accepting of thicken soda as pt states he does not drink tea and doesnt want water              Upper Body Dressing : Moderate assistance   Lower Body Dressing: Maximal assistance   Toilet Transfer: +2 for physical assistance;Moderate assistance                   Vision   Vision Assessment?: Vision impaired- to be further tested in functional context;No apparent visual deficits Additional Comments: pt visually scanning appropriately     Perception         Praxis         Pertinent Vitals/Pain Pain Assessment Pain Assessment: No/denies pain     Extremity/Trunk Assessment Upper Extremity Assessment Upper Extremity Assessment: Generalized weakness   Lower Extremity Assessment Lower Extremity Assessment: Defer to PT evaluation RLE Deficits / Details: able to lift hips into flexion and knees into extension against gravity, MMT scores of 4+ at quads, but demonstrates generalized weakness and incoordination with functional mobility; denied numbness/tingling bil RLE Coordination: decreased gross motor LLE Deficits / Details: able to lift hips into flexion and knees into extension against gravity, MMT scores of 4+ at quads, but demonstrates generalized weakness and incoordination with functional mobility; denied numbness/tingling bil LLE Coordination: decreased gross motor   Cervical / Trunk Assessment Cervical / Trunk Assessment: Kyphotic   Communication Communication Communication: Impaired Factors Affecting Communication: Hearing impaired   Cognition Arousal: Alert Behavior During Therapy: Flat affect Cognition: Cognition impaired   Orientation impairments: Place, Time, Situation Awareness: Intellectual awareness impaired, Online awareness impaired   Attention impairment (select first level of impairment): Sustained  attention   OT - Cognition Comments: pt reports that its thursday nearly june and denies that he had fallen. OT states you fell down approximately 14 stairs and pt states oh that should about right and agreeable then accepting of education of fall. pt able to recall information after 2 minutes.               [employer] Scales of Cognitive Functioning: Confused, Appropriate: Moderate Assistance [VI] Following commands: Impaired Following commands impaired: Only follows one step commands consistently, Follows one step commands with increased time     Cueing  General Comments   Cueing Techniques: Verbal cues;Tactile cues  VSS on RA   Exercises     Shoulder Instructions      Home Living Family/patient expects to be discharged to:: Unsure                                 Additional Comments: uncertain as RN reports significant other is not allowing pt to return home with her; pt is also unreliable historian currently  Lives With: Alone    Prior Functioning/Environment Prior Level of Function : Independent/Modified Independent;Patient poor historian/Family not available             Mobility Comments: Likely independent      OT Problem List: Decreased activity tolerance;Impaired balance (sitting and/or standing);Decreased cognition;Decreased safety awareness;Decreased knowledge of use of DME or AE;Decreased  knowledge of precautions;Decreased strength   OT Treatment/Interventions: Self-care/ADL training;Energy conservation;DME and/or AE instruction;Therapeutic activities;Balance training;Patient/family education;Cognitive remediation/compensation      OT Goals(Current goals can be found in the care plan section)   Acute Rehab OT Goals Patient Stated Goal: none stated OT Goal Formulation: Patient unable to participate in goal setting Time For Goal Achievement: 02/18/24 Potential to Achieve Goals: Good   OT Frequency:  Min 2X/week     Co-evaluation PT/OT/SLP Co-Evaluation/Treatment: Yes Reason for Co-Treatment: Necessary to address cognition/behavior during functional activity;For patient/therapist safety;To address functional/ADL transfers PT goals addressed during session: Mobility/safety with mobility;Balance OT goals addressed during session: ADL's and self-care      AM-PAC OT "6 Clicks" Daily Activity     Outcome Measure Help from another person eating meals?: A Little Help from another person taking care of personal grooming?: A Little Help from another person toileting, which includes using toliet, bedpan, or urinal?: A Lot Help from another person bathing (including washing, rinsing, drying)?: A Lot Help from another person to put on and taking off regular upper body clothing?: A Little Help from another person to put on and taking off regular lower body clothing?: A Lot 6 Click Score: 15   End of Session Equipment Utilized During Treatment: Gait belt Nurse Communication: Mobility status;Precautions  Activity Tolerance: Patient tolerated treatment well Patient left: in chair;with call bell/phone within reach;with chair alarm set  OT Visit Diagnosis: Unsteadiness on feet (R26.81);Muscle weakness (generalized) (M62.81)                Time: 1610-9604 OT Time Calculation (min): 18 min Charges:  OT General Charges $OT Visit: 1 Visit OT Evaluation $OT Eval Moderate Complexity: 1 Mod   Brynn, OTR/L  Acute Rehabilitation Services Office: [phone] .   Neomia Banner 02/04/2024, 1:05 PM

## 2024-02-04 NOTE — Evaluation (Signed)
 Physical Therapy Evaluation Patient Details Name: [name] MRN: [id number] DOB: [birth year] Today's Date: 02/04/2024  History of Present Illness  73 yo male presenting to ED 5/28 after being found seizing after falling down stairs. CTH bil SDH and SAH with trace IVH and non-depressed occipital skull fx. Intubated 5/28, self-extubated 5/30. Recently sustained bilateral rib fxs after a fall 5/17. Imaging also showed stable subacute fractures involving the right sacral ala, right superior and inferior rami, and left parasymphyseal region along with stable superior endplate compression deformities at T3, T4, and T5 since 01/21/2024. PMH includes ETOH abuse s/p two liver transplants, 5/17 fall with R sacral ala superior and inferior rami fx, L superior endplate compression deformities at T3 T4 T5   Clinical Impression  Pt presents with condition above and deficits mentioned below, see PT Problem List. Pt is currently disoriented to the situation and location and is a poor historian. His PLOF and home info is currently unknown, but he likely was independent at baseline based on chart review. Currently, he is demonstrating deficits in cognition, balance, gross strength, gross coordination, and activity tolerance. He required maxAx2 to transition supine to sit EOB and modAx2 to transfer to stand and take pivotal steps with bil HHA. He is at high risk for falls. As he has likely had a drastic functional decline, he could greatly benefit from intensive inpatient rehab, > 3 hours/day. Will continue to follow acutely.      If plan is discharge home, recommend the following: Two people to help with walking and/or transfers;Two people to help with bathing/dressing/bathroom;Assistance with cooking/housework;Assistance with feeding;Direct supervision/assist for medications management;Direct supervision/assist for financial management;Assist for transportation;Help with stairs or ramp for entrance;Supervision due  to cognitive status   Can travel by private vehicle        Equipment Recommendations Rolling walker (2 wheels);BSC/3in1 (TBA further)  Recommendations for Other Services  Rehab consult    Functional Status Assessment Patient has had a recent decline in their functional status and demonstrates the ability to make significant improvements in function in a reasonable and predictable amount of time.     Precautions / Restrictions Precautions Precautions: Fall;Other (comment) Precaution/Restrictions Comments: cortrak Restrictions Weight Bearing Restrictions Per Provider Order: No Other Position/Activity Restrictions: Dr. Aniceto Barley confirmed no spinal precautions or weight bearing restrictions 5/31      Mobility  Bed Mobility Overal bed mobility: Needs Assistance Bed Mobility: Supine to Sit     Supine to sit: +2 for physical assistance, +2 for safety/equipment, HOB elevated, Max assist     General bed mobility comments: Pt demonstrated poor comprehension to move his legs off the R EOB, often moving them to the R then putting them back to the L on the bed when cued. Pt required maxAx2 to move and direct the legs, pivot the hips, and ascend his trunk. He did demonstrate initiation to push through his arms to assist with his trunk.    Transfers Overall transfer level: Needs assistance Equipment used: 2 person hand held assist Transfers: Sit to/from Stand, Bed to chair/wheelchair/BSC Sit to Stand: Mod assist, +2 physical assistance, +2 safety/equipment   Step pivot transfers: Mod assist, +2 physical assistance, +2 safety/equipment       General transfer comment: Pt required modAx2 to power up to stand and gain balance then step pivot to R from EOB to recliner with bil HHA. PT drags his bil feet, L more than R.    Ambulation/Gait Ambulation/Gait assistance: Mod assist, +2 physical assistance, +2 safety/equipment Gait  Distance (Feet): 2 Feet Assistive device: 2 person hand held  assist Gait Pattern/deviations: Step-to pattern, Decreased step length - right, Decreased step length - left, Decreased stride length, Decreased dorsiflexion - right, Decreased dorsiflexion - left, Trunk flexed, Shuffle Gait velocity: reduced Gait velocity interpretation: <1.31 ft/sec, indicative of household ambulator   General Gait Details: Pt drags his bil feet, L > R, demonstrating poor step length and feet clearance. He maintains a very flexed posture and needs modAx2 with bil HHA to take a few pivotal steps  Stairs            Wheelchair Mobility     Tilt Bed    Modified Rankin (Stroke Patients Only)       Balance Overall balance assessment: Needs assistance Sitting-balance support: No upper extremity supported, Feet supported Sitting balance-Leahy Scale: Fair Sitting balance - Comments: static sitting EOB with CGA for safety   Standing balance support: Bilateral upper extremity supported, During functional activity Standing balance-Leahy Scale: Poor Standing balance comment: reliant on bil UE support and modAx2                             Pertinent Vitals/Pain Pain Assessment Pain Assessment: No/denies pain    Home Living Family/patient expects to be discharged to:: Unsure                   Additional Comments: uncertain as RN reports significant other is not allowing pt to return home with her; pt is also unreliable historian currently    Prior Function Prior Level of Function : Independent/Modified Independent;Patient poor historian/Family not available             Mobility Comments: Likely independent       Extremity/Trunk Assessment   Upper Extremity Assessment Upper Extremity Assessment: Defer to OT evaluation    Lower Extremity Assessment Lower Extremity Assessment: Generalized weakness;RLE deficits/detail;LLE deficits/detail RLE Deficits / Details: able to lift hips into flexion and knees into extension against gravity,  MMT scores of 4+ at quads, but demonstrates generalized weakness and incoordination with functional mobility; denied numbness/tingling bil RLE Coordination: decreased gross motor LLE Deficits / Details: able to lift hips into flexion and knees into extension against gravity, MMT scores of 4+ at quads, but demonstrates generalized weakness and incoordination with functional mobility; denied numbness/tingling bil LLE Coordination: decreased gross motor    Cervical / Trunk Assessment Cervical / Trunk Assessment: Kyphotic  Communication   Communication Communication: Impaired Factors Affecting Communication: Hearing impaired    Cognition Arousal: Alert Behavior During Therapy: Flat affect   PT - Cognitive impairments: Orientation, Awareness, Memory, Attention, Initiation, Sequencing, Problem solving, Safety/Judgement, Rancho level   Orientation impairments: Place, Situation               Rancho Levels of Cognitive Functioning Rancho [place] Amigos Scales of Cognitive Functioning: Confused, Appropriate: Moderate Assistance [email or web address] Scales of Cognitive Functioning: Confused, Appropriate: Moderate Assistance [VI] PT - Cognition Comments: Thought he was at [facility]. When provided options he was able to provide the correct month. Does not recall the situation. Slow to process and respond to questions/cues. Poor sequencing, needing step-by-step cuing and assistance for all functional mobility. Following commands: Impaired Following commands impaired: Only follows one step commands consistently, Follows one step commands with increased time     Cueing Cueing Techniques: Verbal cues, Tactile cues     General Comments General comments (skin integrity, edema, etc.): VSS  on RA    Exercises     Assessment/Plan    PT Assessment Patient needs continued PT services  PT Problem List Decreased strength;Decreased activity tolerance;Decreased balance;Decreased mobility;Decreased  coordination;Decreased knowledge of use of DME;Decreased cognition;Decreased safety awareness       PT Treatment Interventions DME instruction;Gait training;Stair training;Functional mobility training;Therapeutic activities;Therapeutic exercise;Balance training;Neuromuscular re-education;Cognitive remediation;Patient/family education    PT Goals (Current goals can be found in the Care Plan section)  Acute Rehab PT Goals Patient Stated Goal: agreeable to sit up PT Goal Formulation: With patient Time For Goal Achievement: 02/18/24 Potential to Achieve Goals: Good    Frequency Min 3X/week     Co-evaluation   Reason for Co-Treatment: Necessary to address cognition/behavior during functional activity;For patient/therapist safety;To address functional/ADL transfers PT goals addressed during session: Mobility/safety with mobility;Balance         AM-PAC PT "6 Clicks" Mobility  Outcome Measure Help needed turning from your back to your side while in a flat bed without using bedrails?: A Lot Help needed moving from lying on your back to sitting on the side of a flat bed without using bedrails?: Total Help needed moving to and from a bed to a chair (including a wheelchair)?: Total Help needed standing up from a chair using your arms (e.g., wheelchair or bedside chair)?: Total Help needed to walk in hospital room?: Total Help needed climbing 3-5 steps with a railing? : Total 6 Click Score: 7    End of Session Equipment Utilized During Treatment: Gait belt Activity Tolerance: Patient tolerated treatment well Patient left: in chair;with call bell/phone within reach;with chair alarm set Nurse Communication: Mobility status;Need for lift equipment PT Visit Diagnosis: Unsteadiness on feet (R26.81);Other abnormalities of gait and mobility (R26.89);Muscle weakness (generalized) (M62.81);History of falling (Z91.81);Difficulty in walking, not elsewhere classified (R26.2);Other symptoms and signs  involving the nervous system (R29.898)    Time: 0865-7846 PT Time Calculation (min) (ACUTE ONLY): 14 min   Charges:   PT Evaluation $PT Eval Moderate Complexity: 1 Mod   PT General Charges $$ ACUTE PT VISIT: 1 Visit         Vernida Goodie, PT, DPT Acute Rehabilitation Services  Office: [phone]   Ellyn Hack 02/04/2024, 12:46 PM

## 2024-02-04 NOTE — Progress Notes (Signed)
 Speech Language Pathology Treatment: Dysphagia  Patient Details Name: [name] MRN: [id number] DOB: 24-Oct-1950 Today's Date: 02/04/2024 Time: 1914-7829 SLP Time Calculation (min) (ACUTE ONLY): 10 min  Assessment / Plan / Recommendation Clinical Impression  Pt presents with increased lethargy, requiring Mod cueing to sustain alertness during PO trials. He reports a decreased interest in eating/drinking with limited intake since diet initiation previous date. Pt was agreeable to take a few sips of nectar thick juice, which resulted in throat clearance. This was persistent both during initial evaluation and during MBS and was effective in clearing penetrates from the laryngeal vestibule and cueing him to swallow a second time to clear residue. He declined solids. Provided EMST, which he was able to do x5 set to the lowest setting. For now, recommend continuing current diet with full supervision to cue him to swallow multiple times and clear his throat intermittently.    HPI HPI: [name] is a 73 yo male presenting to ED 5/28 after being found seizing after falling down stairs. CTH shows bilateral SDH and SAH with trace IVH and non-depressed occipital skull fx. Intubated 5/28, self-extubated 5/30. Recently sustained bilateral rib fxs after a fall 5/17. PMH includes ETOH abuse s/p two liver transplants      SLP Plan  Continue with current plan of care      Recommendations for follow up therapy are one component of a multi-disciplinary discharge planning process, led by the attending physician.  Recommendations may be updated based on patient status, additional functional criteria and insurance authorization.    Recommendations  Diet recommendations: Dysphagia 2 (fine chop);Nectar-thick liquid Liquids provided via: Cup;Straw Medication Administration: Crushed with puree Supervision: Staff to assist with self feeding Compensations: Minimize environmental distractions;Slow  rate;Small sips/bites;Clear throat intermittently;Multiple dry swallows after each bite/sip Postural Changes and/or Swallow Maneuvers: Seated upright 90 degrees;Upright 30-60 min after meal                  Oral care before and after PO   Frequent or constant Supervision/Assistance Dysphagia, pharyngeal phase (R13.13)     Continue with current plan of care     Amil Kale, M.A., CCC-SLP Speech Language Pathology, Acute Rehabilitation Services  Secure Chat preferred [phone]   02/04/2024, 9:52 AM

## 2024-02-05 LAB — CBC
HCT: 28.4 % — ABNORMAL LOW (ref 39.0–52.0)
Hemoglobin: 8.9 g/dL — ABNORMAL LOW (ref 13.0–17.0)
MCH: 30.1 pg (ref 26.0–34.0)
MCHC: 31.3 g/dL (ref 30.0–36.0)
MCV: 95.9 fL (ref 80.0–100.0)
Platelets: 344 10*3/uL (ref 150–400)
RBC: 2.96 MIL/uL — ABNORMAL LOW (ref 4.22–5.81)
RDW: 16.4 % — ABNORMAL HIGH (ref 11.5–15.5)
WBC: 6.2 10*3/uL (ref 4.0–10.5)
nRBC: 0 % (ref 0.0–0.2)

## 2024-02-05 LAB — BASIC METABOLIC PANEL WITH GFR
Anion gap: 8 (ref 5–15)
BUN: 53 mg/dL — ABNORMAL HIGH (ref 8–23)
CO2: 24 mmol/L (ref 22–32)
Calcium: 8.7 mg/dL — ABNORMAL LOW (ref 8.9–10.3)
Chloride: 103 mmol/L (ref 98–111)
Creatinine, Ser: 2.39 mg/dL — ABNORMAL HIGH (ref 0.61–1.24)
GFR, Estimated: 28 mL/min — ABNORMAL LOW (ref >60)
Glucose, Bld: 123 mg/dL — ABNORMAL HIGH (ref 70–99)
Potassium: 4.6 mmol/L (ref 3.5–5.1)
Sodium: 135 mmol/L (ref 135–145)

## 2024-02-05 LAB — GLUCOSE, CAPILLARY
Glucose-Capillary: 118 mg/dL — ABNORMAL HIGH (ref 70–99)
Glucose-Capillary: 120 mg/dL — ABNORMAL HIGH (ref 70–99)
Glucose-Capillary: 117 mg/dL — ABNORMAL HIGH (ref 70–99)
Glucose-Capillary: 123 mg/dL — ABNORMAL HIGH (ref 70–99)
Glucose-Capillary: 106 mg/dL — ABNORMAL HIGH (ref 70–99)

## 2024-02-05 LAB — MAGNESIUM: Magnesium: 2.1 mg/dL (ref 1.7–2.4)

## 2024-02-05 LAB — PHOSPHORUS: Phosphorus: 1.8 mg/dL — ABNORMAL LOW (ref 2.5–4.6)

## 2024-02-05 MED ORDER — LORAZEPAM 2 MG/ML IJ SOLN: 0.0000 mg | Freq: Three times a day (TID) | INTRAMUSCULAR | Status: DC

## 2024-02-05 MED ORDER — LORAZEPAM 2 MG/ML IJ SOLN
0.0000 mg | INTRAMUSCULAR | Status: DC | PRN
  Administered 2024-02-05: 1 mg via INTRAVENOUS
  Administered 2024-02-05: 2 mg via INTRAVENOUS
  Filled 2024-02-05 (×2): qty 1

## 2024-02-05 MED ORDER — METOPROLOL TARTRATE 5 MG/5ML IV SOLN
5.0000 mg | Freq: Once | INTRAVENOUS | Status: AC | PRN
  Administered 2024-02-05: 5 mg via INTRAVENOUS
  Filled 2024-02-05: qty 5

## 2024-02-05 MED ORDER — LORAZEPAM 2 MG/ML IJ SOLN: 0.0000 mg | INTRAMUSCULAR | Status: DC

## 2024-02-05 MED ORDER — STERILE WATER FOR INJECTION IJ SOLN
INTRAMUSCULAR | Status: AC
  Administered 2024-02-05: 0.5 mL
  Filled 2024-02-05: qty 10

## 2024-02-05 MED ORDER — METOPROLOL TARTRATE 5 MG/5ML IV SOLN
5.0000 mg | Freq: Four times a day (QID) | INTRAVENOUS | Status: AC | PRN
  Administered 2024-02-05 – 2024-02-09 (×4): 5 mg via INTRAVENOUS
  Filled 2024-02-05 (×4): qty 5

## 2024-02-05 NOTE — Progress Notes (Signed)
 Patient continues on CIWA. Continued elevated HR between 116-130. Patient received prn pain meds for pain and ativan  per CIWA protocol and prn Lopressor  for elevated HR per orders. Continued elevated HR after Lopressor . MD Ramiro Burly called. New order for one time dose of lopressor  via IV. Medication given. Patient HR has responded. Running between 98-100. Patient currently sleep but easily aroused. Tele connected. Bed at lowest position. Jenetta Misty, RN

## 2024-02-05 NOTE — Plan of Care (Signed)

## 2024-02-05 NOTE — Progress Notes (Signed)
 Patient presented with afib on tele. Patient assessed and presents with regular irregular rhythm. EKG obtained and patient present in Sinus tach.  PA on call made aware. Orders to continue to monitor and call if patient presents with irregular rhthym. Patient in bed, lowest position. Coretrak infusing. Tele connected. Jenetta Misty, RN

## 2024-02-05 NOTE — Progress Notes (Signed)
 NEUROSURGERY PROGRESS NOTE  Doing well. No headaches. No acute events overnight   Temp:  [97.6 F (36.4 C)-98.9 F (37.2 C)] 97.8 F (36.6 C) (06/01 0743) Pulse Rate:  [100-115] 115 (06/01 0743) Resp:  [12-24] 24 (06/01 0743) BP: (135-160)/(81-98) 147/93 (06/01 0743) SpO2:  [91 %-95 %] 95 % (06/01 0743) Weight:  [61 kg] 61 kg (06/01 0500)    Jeannette Mills, NP 02/05/2024 8:08 AM

## 2024-02-05 NOTE — Progress Notes (Signed)
 Subjective: CC: A&O x 3 (thought he was at [facility]).  Reported to be in possible A. Fib overnight.  Tele reviewed. EKG waiting to be crossed over - RN reports it was sinus tachy - will review.  Currently in sinus tach. HR 115.  Denies cp or sob.  Tolerating po and tf's. No n/v. BM yesterday.  Voiding.   Afebrile. Tachy. No hypotension. On RA.  Last CIWA score this am was 3. He has not been drinking spiritus frumenti.   Objective: Vital signs in last 24 hours: Temp:  [97.6 F (36.4 C)-98.9 F (37.2 C)] 97.8 F (36.6 C) (06/01 0743) Pulse Rate:  [100-115] 115 (06/01 0743) Resp:  [12-24] 24 (06/01 0743) BP: (135-160)/(81-98) 147/93 (06/01 0743) SpO2:  [91 %-95 %] 95 % (06/01 0743) Weight:  [61 kg] 61 kg (06/01 0500) Last BM Date : 02/03/24  Intake/Output from previous day: 05/31 0701 - 06/01 0700 In: 1293.8 [NG/GT:1283.8; IV Piggyback:10] Out: 500 [Urine:500] Intake/Output this shift: No intake/output data recorded.  PE: Gen:  Alert, NAD, pleasant HEENT: EOM's intact, pupils equal and round Card:  Tachy Pulm:  CTAB, no W/R/R, effort normal Abd: Soft, ND, NT Ext:  No LE edema  Psych: A&Ox3  Neuro: Cn 3-12 grossly intact, mae's, f/c, non-focal  Lab Results:  Recent Labs    02/03/24 0540 02/04/24 0648  WBC 11.5* 9.8  HGB 10.0* 8.4*  HCT 30.9* 26.2*  PLT 272 309   BMET Recent Labs    02/03/24 0540 02/04/24 0648  NA 134* 131*  K 4.1 4.0  CL 103 99  CO2 21* 21*  GLUCOSE 136* 121*  BUN 35* 51*  CREATININE 2.57* 2.87*  CALCIUM 8.5* 8.2*   PT/INR No results for input(s): "LABPROT", "INR" in the last 72 hours.  CMP     Component Value Date/Time   NA 131 (L) 02/04/2024 0648   K 4.0 02/04/2024 0648   CL 99 02/04/2024 0648   CO2 21 (L) 02/04/2024 0648   GLUCOSE 121 (H) 02/04/2024 0648   BUN 51 (H) 02/04/2024 0648   CREATININE 2.87 (H) 02/04/2024 0648   CALCIUM 8.2 (L) 02/04/2024 0648   PROT 5.6 (L) 02/01/2024 1000   ALBUMIN 2.4 (L) 02/01/2024 1000    AST 23 02/01/2024 1000   ALT 14 02/01/2024 1000   ALKPHOS 114 02/01/2024 1000   BILITOT 0.7 02/01/2024 1000   GFRNONAA 23 (L) 02/04/2024 0648   Lipase  No results found for: "LIPASE"  Studies/Results: DG Abd Portable 1V Result Date: 02/03/2024 CLINICAL DATA:  Feeding tube placement. EXAM: PORTABLE ABDOMEN - 1 VIEW COMPARISON:  None Available. FINDINGS: Weighted enteric tube with tip and side-port below the diaphragm in the stomach. The tip is directed cranially in the region of the gastric cardia. There is barium within the stomach and scattered small bowel. IMPRESSION: Weighted enteric tube with tip and side-port below the diaphragm in the stomach. The tip is directed cranially in the region of the gastric cardia. Electronically Signed   By: [name] M.D.   On: 02/03/2024 17:01   DG Swallowing Func-Speech Pathology Result Date: 02/03/2024 Modified Barium Swallow Study Patient Details Name: [name] MRN: [id number] Date of Birth: 1951-05-06 Today's Date: 02/03/2024 HPI/PMH: HPI: [name] is a 73 yo male presenting to ED 5/28 after being found seizing after falling down stairs. CTH shows bilateral SDH and SAH with trace IVH and non-depressed occipital skull fx. Intubated 5/28, self-extubated 5/30. Recently sustained bilateral rib fxs  after a fall 5/17. PMH includes ETOH abuse s/p two liver transplants Clinical Impression: Clinical Impression: Pt exhibits moderate pharyngeal dysphagia characterized by reduced laryngeal elevation and absent epiglottic deflection, which is suspected to be acute on chronic. Suspected cervical osteophytes contribute to a collection of vallecular and pyriform sinus residue, which is only partially cleared by cued subswallows. Thin liquids reach the vocal folds during the swallow and are unable to be ejected (PAS 5). More airway invasion occurs after the swallow as a result of the collection of residue. As the penetrates sit on the vocal folds during  subsequent trials, they are eventually aspirated without sensation (PAS 8). Thicker consistencies allow more time for pt to achieve laryngeal closure, reducing instances of penetration. As residue mixes within the valleculae and pyriform sinuses, it is subsequently more difficult to discern what consistency enters the laryngeal vestibule after the swallow. Suspect nectar thick liquids reach the vocal folds but are ejected (PAS 4). While honey thick liquids do not result in penetration, they increase residue substantially. A chin tuck position (with thin and nectar thick liquids) and a R head turn (with nectar thick liquids) did not yield functional improvement. Although eructation is persistent, the esophageal sweep did not reveal any significant retention or retrograde flow. Recommending Dys 2 diet and nectar thick liquids with full supervision to cue pt to swallow multiple times and cough intermittently. Of note, pt's nose bled in small amounts two separate times throughout the study. RN notified. SLP will continue following with consideration of initiating RMT to strengthen pt's cough after intubation. Factors that may increase risk of adverse event in presence of aspiration [name] & [name] 2021): Factors that may increase risk of adverse event in presence of aspiration [name] & [name] 2021): Reduced cognitive function; Frail or deconditioned; Inadequate oral hygiene; Weak cough Recommendations/Plan: Swallowing Evaluation Recommendations Swallowing Evaluation Recommendations Recommendations: PO diet PO Diet Recommendation: Dysphagia 2 (Finely chopped); Mildly thick liquids (Level 2, nectar thick) Liquid Administration via: Cup; Straw Medication Administration: Crushed with puree Supervision: Full assist for feeding; Full supervision/cueing for swallowing strategies Swallowing strategies  : Minimize environmental distractions; Slow rate; Small bites/sips; Multiple dry swallows after each bite/sip; Hard cough  after swallowing Postural changes: Position pt fully upright for meals; Stay upright 30-60 min after meals Oral care recommendations: Oral care QID (4x/day); Oral care before PO Caregiver Recommendations: Avoid jello, ice cream, thin soups, popsicles; Remove water  pitcher Treatment Plan Treatment Plan Treatment recommendations: Therapy as outlined in treatment plan below Follow-up recommendations: Acute inpatient rehab (3 hours/day) Functional status assessment: Patient has had a recent decline in their functional status and demonstrates the ability to make significant improvements in function in a reasonable and predictable amount of time. Treatment frequency: Min 2x/week Treatment duration: 2 weeks Interventions: Aspiration precaution training; Compensatory techniques; Patient/family education; Trials of upgraded texture/liquids; Respiratory muscle strength training Recommendations Recommendations for follow up therapy are one component of a multi-disciplinary discharge planning process, led by the attending physician.  Recommendations may be updated based on patient status, additional functional criteria and insurance authorization. Assessment: Orofacial Exam: Orofacial Exam Oral Cavity: Oral Hygiene: WFL Oral Cavity - Dentition: Missing dentition; Poor condition Orofacial Anatomy: WFL Oral Motor/Sensory Function: WFL Anatomy: Anatomy: Suspected cervical osteophytes Boluses Administered: Boluses Administered Boluses Administered: Thin liquids (Level 0); Mildly thick liquids (Level 2, nectar thick); Moderately thick liquids (Level 3, honey thick); Puree  Oral Impairment Domain: Oral Impairment Domain Lip Closure: Interlabial escape, no progression to anterior lip Tongue control during bolus  hold: Cohesive bolus between tongue to palatal seal Bolus preparation/mastication: Timely and efficient chewing and mashing Bolus transport/lingual motion: Brisk tongue motion Oral residue: Complete oral clearance Location of  oral residue : N/A Initiation of pharyngeal swallow : Posterior angle of the ramus  Pharyngeal Impairment Domain: Pharyngeal Impairment Domain Soft palate elevation: No bolus between soft palate (SP)/pharyngeal wall (PW) Laryngeal elevation: Partial superior movement of thyroid cartilage/partial approximation of arytenoids to epiglottic petiole Anterior hyoid excursion: Partial anterior movement Epiglottic movement: No inversion Laryngeal vestibule closure: Incomplete, narrow column air/contrast in laryngeal vestibule Pharyngeal stripping wave : Present - diminished Pharyngeal contraction (A/P view only): N/A Pharyngoesophageal segment opening: Partial distention/partial duration, partial obstruction of flow Tongue base retraction: Trace column of contrast or air between tongue base and PPW Pharyngeal residue: Collection of residue within or on pharyngeal structures Location of pharyngeal residue: Valleculae; Pyriform sinuses  Esophageal Impairment Domain: Esophageal Impairment Domain Esophageal clearance upright position: Complete clearance, esophageal coating Pill: No data recorded Penetration/Aspiration Scale Score: Penetration/Aspiration Scale Score 1.  Material does not enter airway: Moderately thick liquids (Level 3, honey thick); Puree 4.  Material enters airway, CONTACTS cords then ejected out: Mildly thick liquids (Level 2, nectar thick) 8.  Material enters airway, passes BELOW cords without attempt by patient to eject out (silent aspiration) : Thin liquids (Level 0) Compensatory Strategies: Compensatory Strategies Compensatory strategies: Yes Straw: Ineffective Ineffective Straw: Thin liquid (Level 0); Mildly thick liquid (Level 2, nectar thick) Multiple swallows: Effective Effective Multiple Swallows: Mildly thick liquid (Level 2, nectar thick); Moderately thick liquid (Level 3, honey thick); Puree Chin tuck: Ineffective Ineffective Chin Tuck: Thin liquid (Level 0); Mildly thick liquid (Level 2, nectar  thick) Right head turn: Ineffective Ineffective Right Head Turn: Mildly thick liquid (Level 2, nectar thick)   General Information: Caregiver present: No  Diet Prior to this Study: NPO   Temperature : Normal   Respiratory Status: WFL   Supplemental O2: None (Room air)   History of Recent Intubation: Yes  Behavior/Cognition: Alert; Cooperative; Requires cueing Self-Feeding Abilities: Needs assist with self-feeding Baseline vocal quality/speech: Dysphonic Volitional Cough: Able to elicit Volitional Swallow: Able to elicit Exam Limitations: No limitations Goal Planning: Prognosis for improved oropharyngeal function: Good Barriers to Reach Goals: Cognitive deficits; Time post onset No data recorded Patient/Family Stated Goal: none stated Consulted and agree with results and recommendations: Patient Pain: Pain Assessment Pain Assessment: No/denies pain Facial Expression: 0 Body Movements: 0 Muscle Tension: 0 Compliance with ventilator (intubated pts.): 0 Vocalization (extubated pts.): N/A CPOT Total: 0 End of Session: Start Time:SLP Start Time (ACUTE ONLY): 1425 Stop Time: SLP Stop Time (ACUTE ONLY): 1445 Time Calculation:SLP Time Calculation (min) (ACUTE ONLY): 20 min Charges: SLP Evaluations $ SLP Speech Visit: 1 Visit SLP Evaluations $BSS Swallow: 1 Procedure $MBS Swallow: 1 Procedure SLP visit diagnosis: SLP Visit Diagnosis: Dysphagia, pharyngeal phase (R13.13) Past Medical History: No past medical history on file. Past Surgical History: No past surgical history on file. Amil Kale, M.A., CCC-SLP Speech Language Pathology, Acute Rehabilitation Services Secure Chat preferred [phone] 02/03/2024, 3:33 PM   Anti-infectives: Anti-infectives (From admission, onward)    None        Assessment/Plan 73 year old status post fall downstairs with seizure activity   TBI/bilateral subdural hematoma/anterior subarachnoid hemorrhage/skull fracture - per Dr. Rochelle Chu  Loaded with Keppra  in ED, continue BID, F/U CT  head slightly increased SDH but exam stable. TBI therapies.  Bilateral rib fractures that were sustained in a fall on 01/21/2024, similar on follow-up  CT scan - multimodal pain control, pulm toilet Acute hypoxic ventilator dependent respiratory failure - self-extubated, doing well, weaned to RA Etoh - CIWA and spiritus frumenti. Multi, thiamine , folic acid . Consider Librium if CIWA scores increase and he is not taking spiritus frumenti.  Tachycardia - Cont tele. EKG. Check labs and lytes. BB PRN. If c/f A. Fib can consult cards.  FEN: D2 diet per SLP. Nocturnal TF's via cortrak. FWF VTE: PAS, sqh ID: None Foley: Placed in trauma bay, out, spont void Dispo: 4NP, TBI therapies. Does not have support for CIR. Likely plan SNF.   I reviewed nursing notes, Consultant (NSGY) notes, last 24 h vitals and pain scores, last 48 h intake and output, last 24 h labs and trends, and last 24 h imaging results. Discussed w/ NSGY in person.    LOS: 4 days    Delton Filbert, [facility] Surgery 02/05/2024, 9:09 AM Please see Amion for pager number during day hours 7:00am-4:30pm

## 2024-02-05 NOTE — TOC Initial Note (Addendum)
 Transition of Care [facility]) - Initial/Assessment Note    Patient Details  Name: [name] MRN: [id number] Date of Birth: 1951/06/24  [facility] CM/SW Contact:    Carmon Christen, LCSWA Phone Number: 02/05/2024, 12:44 PM  Clinical Narrative:                  CSW received consult for possible SNF placement at time of discharge. Due to patients current orientation CSW followed up with patients significant other Rice Chamorro regarding PT recommendations/dc plan for patient at time of discharge. Patients significant other informed CSW that PTA patient comes from home with her.Patients significant other expressed understanding of PT recommendation and is agreeable to SNF placement for patient at time of discharge. Patients significant other gave CSW permission to fax out initial referral for patient for SNF placement. CSW informed patients significant other that TOC will continue to follow and fax out initial referral closer to patient being medically ready. Patient currently has cortrak.CSW discussed insurance authorization process and will provide Medicare SNF ratings list with accepted SNF bed offers when available. No further questions reported at this time. CSW to continue to follow and assist with discharge planning needs.    Expected Discharge Plan: Skilled Nursing Facility Barriers to Discharge: Continued Medical Work up   Patient Goals and CMS Choice     Choice offered to / list presented to :  (significant other Rice Chamorro)      Expected Discharge Plan and Services In-house Referral: Clinical Social Work     Living arrangements for the past 2 months: Single Family Home                                      Prior Living Arrangements/Services Living arrangements for the past 2 months: Single Family Home Lives with:: Significant Other Patient language and need for interpreter reviewed:: Yes        Need for Family Participation in Patient Care: Yes (Comment) Care  giver support system in place?: Yes (comment)   Criminal Activity/Legal Involvement Pertinent to Current Situation/Hospitalization: No - Comment as needed  Activities of Daily Living      Permission Sought/Granted Permission sought to share information with : Case Manager, Family Supports, Magazine features editor                Emotional Assessment       Orientation: :  (Confused) Alcohol  / Substance Use: Not Applicable Psych Involvement: No (comment)  Admission diagnosis:  Subdural hematoma (HCC) [S06.5XAA] Patient Active Problem List   Diagnosis Date Noted   Subdural hematoma (HCC) 02/01/2024   PCP:  Patient, No Pcp Per Pharmacy:   [facility] [address] [place] - [address] [place] [address] Phone: [phone] Fax: [phone]  [facility] [address] [place] [address] Phone: [phone] Fax: [phone]     Social Drivers of Health (SDOH) Social History:   SDOH Interventions:     Readmission Risk Interventions     No data to display

## 2024-02-05 NOTE — Plan of Care (Signed)

## 2024-02-06 ENCOUNTER — Inpatient Hospital Stay (HOSPITAL_COMMUNITY): Payer: Medicare (Managed Care)

## 2024-02-06 ENCOUNTER — Ambulatory Visit: Payer: Medicare (Managed Care) | Admitting: Family Medicine

## 2024-02-06 LAB — COMPREHENSIVE METABOLIC PANEL WITH GFR
ALT: 14 U/L (ref 0–44)
AST: 19 U/L (ref 15–41)
Albumin: 2.3 g/dL — ABNORMAL LOW (ref 3.5–5.0)
Alkaline Phosphatase: 147 U/L — ABNORMAL HIGH (ref 38–126)
Anion gap: 6 (ref 5–15)
BUN: 61 mg/dL — ABNORMAL HIGH (ref 8–23)
CO2: 24 mmol/L (ref 22–32)
Calcium: 8.6 mg/dL — ABNORMAL LOW (ref 8.9–10.3)
Chloride: 100 mmol/L (ref 98–111)
Creatinine, Ser: 2 mg/dL — ABNORMAL HIGH (ref 0.61–1.24)
GFR, Estimated: 35 mL/min — ABNORMAL LOW (ref >60)
Glucose, Bld: 123 mg/dL — ABNORMAL HIGH (ref 70–99)
Potassium: 5 mmol/L (ref 3.5–5.1)
Sodium: 130 mmol/L — ABNORMAL LOW (ref 135–145)
Total Bilirubin: 0.5 mg/dL (ref 0.0–1.2)
Total Protein: 6.5 g/dL (ref 6.5–8.1)

## 2024-02-06 LAB — CBC
HCT: 31.8 % — ABNORMAL LOW (ref 39.0–52.0)
Hemoglobin: 10 g/dL — ABNORMAL LOW (ref 13.0–17.0)
MCH: 30.3 pg (ref 26.0–34.0)
MCHC: 31.4 g/dL (ref 30.0–36.0)
MCV: 96.4 fL (ref 80.0–100.0)
Platelets: 412 10*3/uL — ABNORMAL HIGH (ref 150–400)
RBC: 3.3 MIL/uL — ABNORMAL LOW (ref 4.22–5.81)
RDW: 16.3 % — ABNORMAL HIGH (ref 11.5–15.5)
WBC: 5 10*3/uL (ref 4.0–10.5)
nRBC: 0 % (ref 0.0–0.2)

## 2024-02-06 LAB — POCT I-STAT 7, (LYTES, BLD GAS, ICA,H+H)
Acid-Base Excess: 2 mmol/L (ref 0.0–2.0)
Bicarbonate: 23.3 mmol/L (ref 20.0–28.0)
Calcium, Ion: 1.21 mmol/L (ref 1.15–1.40)
HCT: 26 % — ABNORMAL LOW (ref 39.0–52.0)
Hemoglobin: 8.8 g/dL — ABNORMAL LOW (ref 13.0–17.0)
O2 Saturation: 95 %
Potassium: 4.7 mmol/L (ref 3.5–5.1)
Sodium: 127 mmol/L — ABNORMAL LOW (ref 135–145)
TCO2: 24 mmol/L (ref 22–32)
pCO2 arterial: 24.2 mmHg — ABNORMAL LOW (ref 32–48)
pH, Arterial: 7.591 — ABNORMAL HIGH (ref 7.35–7.45)
pO2, Arterial: 63 mmHg — ABNORMAL LOW (ref 83–108)

## 2024-02-06 LAB — GLUCOSE, CAPILLARY
Glucose-Capillary: 109 mg/dL — ABNORMAL HIGH (ref 70–99)
Glucose-Capillary: 109 mg/dL — ABNORMAL HIGH (ref 70–99)
Glucose-Capillary: 114 mg/dL — ABNORMAL HIGH (ref 70–99)
Glucose-Capillary: 124 mg/dL — ABNORMAL HIGH (ref 70–99)
Glucose-Capillary: 126 mg/dL — ABNORMAL HIGH (ref 70–99)
Glucose-Capillary: 138 mg/dL — ABNORMAL HIGH (ref 70–99)

## 2024-02-06 LAB — PHOSPHORUS
Phosphorus: 1.4 mg/dL — ABNORMAL LOW (ref 2.5–4.6)
Phosphorus: 2.2 mg/dL — ABNORMAL LOW (ref 2.5–4.6)

## 2024-02-06 MED ORDER — LORAZEPAM 2 MG/ML IJ SOLN
1.0000 mg | INTRAMUSCULAR | Status: AC | PRN
  Administered 2024-02-06: 1 mg via INTRAVENOUS
  Administered 2024-02-06 (×4): 2 mg via INTRAVENOUS
  Administered 2024-02-07 (×4): 1 mg via INTRAVENOUS
  Administered 2024-02-07: 2 mg via INTRAVENOUS
  Administered 2024-02-07 – 2024-02-08 (×7): 1 mg via INTRAVENOUS
  Administered 2024-02-09: 2 mg via INTRAVENOUS
  Administered 2024-02-09: 1 mg via INTRAVENOUS
  Filled 2024-02-06 (×19): qty 1

## 2024-02-06 MED ORDER — SODIUM ZIRCONIUM CYCLOSILICATE 10 G PO PACK
10.0000 g | PACK | Freq: Once | ORAL | Status: DC
  Filled 2024-02-06: qty 1

## 2024-02-06 MED ORDER — LORAZEPAM 1 MG PO TABS
1.0000 mg | ORAL_TABLET | ORAL | Status: AC | PRN
  Filled 2024-02-06: qty 1

## 2024-02-06 MED ORDER — SODIUM ZIRCONIUM CYCLOSILICATE 10 G PO PACK
10.0000 g | PACK | Freq: Once | ORAL | Status: AC
  Administered 2024-02-06: 10 g

## 2024-02-06 MED ORDER — THIAMINE MONONITRATE 100 MG PO TABS
100.0000 mg | ORAL_TABLET | Freq: Every day | ORAL | Status: DC
  Administered 2024-02-07 – 2024-02-10 (×4): 100 mg via ORAL
  Filled 2024-02-06 (×4): qty 1

## 2024-02-06 MED ORDER — IPRATROPIUM-ALBUTEROL 0.5-2.5 (3) MG/3ML IN SOLN: 3.0000 mL | RESPIRATORY_TRACT | Status: DC | PRN

## 2024-02-06 MED ORDER — OXYCODONE HCL 5 MG PO TABS
5.0000 mg | ORAL_TABLET | ORAL | Status: DC | PRN
  Administered 2024-02-06 – 2024-02-07 (×4): 10 mg
  Administered 2024-02-07: 5 mg
  Administered 2024-02-07 – 2024-02-09 (×6): 10 mg
  Administered 2024-02-10 (×2): 5 mg
  Administered 2024-02-11 – 2024-02-13 (×6): 10 mg
  Administered 2024-02-13 – 2024-02-14 (×2): 5 mg
  Administered 2024-02-15 – 2024-02-16 (×3): 10 mg
  Administered 2024-02-17: 5 mg
  Administered 2024-02-17 – 2024-02-21 (×9): 10 mg
  Administered 2024-02-22 – 2024-02-24 (×3): 5 mg
  Administered 2024-02-24 – 2024-02-26 (×4): 10 mg
  Administered 2024-02-27: 5 mg
  Administered 2024-02-27: 10 mg
  Filled 2024-02-06: qty 1
  Filled 2024-02-06 (×2): qty 2
  Filled 2024-02-06 (×2): qty 1
  Filled 2024-02-06 (×7): qty 2
  Filled 2024-02-06: qty 1
  Filled 2024-02-06 (×4): qty 2
  Filled 2024-02-06: qty 1
  Filled 2024-02-06 (×2): qty 2
  Filled 2024-02-06: qty 1
  Filled 2024-02-06 (×4): qty 2
  Filled 2024-02-06: qty 1
  Filled 2024-02-06 (×4): qty 2
  Filled 2024-02-06 (×2): qty 1
  Filled 2024-02-06 (×3): qty 2
  Filled 2024-02-06: qty 1
  Filled 2024-02-06 (×9): qty 2

## 2024-02-06 MED ORDER — IPRATROPIUM-ALBUTEROL 0.5-2.5 (3) MG/3ML IN SOLN
3.0000 mL | RESPIRATORY_TRACT | Status: DC
  Administered 2024-02-06: 3 mL via RESPIRATORY_TRACT
  Filled 2024-02-06: qty 3

## 2024-02-06 MED ORDER — FOLIC ACID 1 MG PO TABS
1.0000 mg | ORAL_TABLET | Freq: Every day | ORAL | Status: DC
  Administered 2024-02-07 – 2024-02-10 (×4): 1 mg via ORAL
  Filled 2024-02-06 (×4): qty 1

## 2024-02-06 MED ORDER — ACETAMINOPHEN 500 MG PO TABS
1000.0000 mg | ORAL_TABLET | Freq: Four times a day (QID) | ORAL | Status: DC
  Administered 2024-02-06 – 2024-02-07 (×3): 1000 mg via ORAL
  Filled 2024-02-06 (×4): qty 2

## 2024-02-06 MED ORDER — POLYETHYLENE GLYCOL 3350 17 G PO PACK: 17.0000 g | PACK | Freq: Every day | ORAL | Status: DC | PRN

## 2024-02-06 MED ORDER — DEXMEDETOMIDINE HCL IN NACL 400 MCG/100ML IV SOLN
0.0000 ug/kg/h | INTRAVENOUS | Status: DC
  Administered 2024-02-06 – 2024-02-07 (×2): 0.4 ug/kg/h via INTRAVENOUS
  Administered 2024-02-07: 0.2 ug/kg/h via INTRAVENOUS
  Filled 2024-02-06 (×5): qty 100

## 2024-02-06 MED ORDER — SODIUM PHOSPHATES 45 MMOLE/15ML IV SOLN: 30.0000 mmol | Freq: Once | INTRAVENOUS | Status: DC

## 2024-02-06 MED ORDER — METOPROLOL TARTRATE 5 MG/5ML IV SOLN
5.0000 mg | Freq: Once | INTRAVENOUS | Status: AC
  Administered 2024-02-06: 5 mg via INTRAVENOUS
  Filled 2024-02-06: qty 5

## 2024-02-06 MED ORDER — POLYETHYLENE GLYCOL 3350 17 G PO PACK
17.0000 g | PACK | Freq: Every day | ORAL | Status: DC
  Administered 2024-02-07 – 2024-02-10 (×2): 17 g via ORAL
  Filled 2024-02-06 (×2): qty 1

## 2024-02-06 MED ORDER — ADULT MULTIVITAMIN W/MINERALS CH
1.0000 | ORAL_TABLET | Freq: Every day | ORAL | Status: DC
  Administered 2024-02-07 – 2024-02-10 (×4): 1 via ORAL
  Filled 2024-02-06 (×4): qty 1

## 2024-02-06 MED ORDER — SODIUM PHOSPHATES 45 MMOLE/15ML IV SOLN
45.0000 mmol | Freq: Once | INTRAVENOUS | Status: AC
  Administered 2024-02-06: 45 mmol via INTRAVENOUS
  Filled 2024-02-06: qty 15

## 2024-02-06 MED ORDER — CHLORHEXIDINE GLUCONATE CLOTH 2 % EX PADS
6.0000 | MEDICATED_PAD | Freq: Every day | CUTANEOUS | Status: DC
  Administered 2024-02-07 – 2024-02-11 (×5): 6 via TOPICAL

## 2024-02-06 NOTE — Progress Notes (Signed)
 Trauma/Critical Care Follow Up Note  Subjective:    Overnight Issues:   Objective:  Vital signs for last 24 hours: Temp:  [97.8 F (36.6 C)-99.1 F (37.3 C)] 98.3 F (36.8 C) (06/02 0729) Pulse Rate:  [92-129] 126 (06/02 0735) Resp:  [18-24] 20 (06/02 0735) BP: (127-166)/(76-118) 147/99 (06/02 0729) SpO2:  [90 %-98 %] 92 % (06/02 0735) Weight:  [60.9 kg] 60.9 kg (06/02 0500)  Hemodynamic parameters for last 24 hours:    Intake/Output from previous day: 06/01 0701 - 06/02 0700 In: 2388.8 [NG/GT:2378.8; IV Piggyback:10] Out: 1200 [Urine:1200]  Intake/Output this shift: Total I/O In: 50 [P.O.:50] Out: 500 [Urine:500]  Vent settings for last 24 hours:    Physical Exam:  Gen: comfortable, no distress Neuro: follows commands, alert, communicative HEENT: PERRL Neck: supple CV: tachycardic Pulm: mildly labored breathing on [place] Abd: soft, NT  , +BM GU: urine clear and yellow, +spontaneous voids Extr: wwp, no edema  Results for orders placed or performed during the hospital encounter of 02/01/24 (from the past 24 hours)  Glucose, capillary     Status: Abnormal   Collection Time: 02/05/24 11:41 AM  Result Value Ref Range   Glucose-Capillary 117 (H) 70 - 99 mg/dL  Glucose, capillary     Status: Abnormal   Collection Time: 02/05/24  4:15 PM  Result Value Ref Range   Glucose-Capillary 123 (H) 70 - 99 mg/dL  Glucose, capillary     Status: Abnormal   Collection Time: 02/05/24  9:04 PM  Result Value Ref Range   Glucose-Capillary 106 (H) 70 - 99 mg/dL  Glucose, capillary     Status: Abnormal   Collection Time: 02/06/24  3:41 AM  Result Value Ref Range   Glucose-Capillary 109 (H) 70 - 99 mg/dL  Phosphorus     Status: Abnormal   Collection Time: 02/06/24  6:18 AM  Result Value Ref Range   Phosphorus 1.4 (L) 2.5 - 4.6 mg/dL  Glucose, capillary     Status: Abnormal   Collection Time: 02/06/24  7:32 AM  Result Value Ref Range   Glucose-Capillary 126 (H) 70 - 99 mg/dL     Assessment & Plan: The plan of care was discussed with the bedside nurse for the day, who is in agreement with this plan and no additional concerns were raised.   Present on Admission:  Subdural hematoma (HCC)    LOS: 5 days   Additional comments:I reviewed the patient's new clinical lab test results.   and I reviewed the patients new imaging test results.    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 Rochelle Chu  Loaded with Keppra  in ED, continue BID, F/U CT head slightly increased SDH but exam stable. TBI therapies.  Bilateral rib fractures that were sustained in a fall on 01/21/2024, similar on follow-up CT scan - multimodal pain control, pulm toilet,  Acute hypoxic ventilator dependent respiratory failure - self-extubated, was doing well, weaned to RA, now on O2, incr to 4L at the tim eof my exam this AM, add flutter, chest PT, duonebs, send ABG and CXR, may have some element of refeeding, will add NaPO4 Etoh - CIWA and spiritus frumenti. Multi, thiamine , folic acid . Consider Librium if CIWA scores increase and he is not taking spiritus frumenti.  Tachycardia - Cont tele. ST on EKG this AM. Check labs and lytes. BB PRN.  FEN: D2 diet per SLP. Nocturnal TF's via cortrak. FWF VTE: PAS,  sqh ID: None Foley: Placed in trauma bay, out, spont void Dispo: 4NP, TBI therapies. Does not have support for CIR. Likely plan SNF. May require escalation of care to ICU.    Anda Bamberg, MD Trauma & General Surgery Please use [email or web address] to contact on call provider  02/06/2024  *Care during the described time interval was provided by me. I have reviewed this patient's available data, including medical history, events of note, physical examination and test results as part of my evaluation.

## 2024-02-06 NOTE — Plan of Care (Signed)
  Problem: Activity: Goal: Risk for activity intolerance will decrease Outcome: Progressing   Problem: Coping: Goal: Level of anxiety will decrease Outcome: Progressing   Problem: Elimination: Goal: Will not experience complications related to bowel motility Outcome: Progressing   Problem: Pain Managment: Goal: General experience of comfort will improve and/or be controlled Outcome: Progressing

## 2024-02-06 NOTE — Progress Notes (Signed)
 PT Cancellation Note  Patient Details Name: Roberth Berling MRN: [id number] DOB: 1950-09-19   Cancelled Treatment:    Reason Eval/Treat Not Completed: Medical issues which prohibited therapy (HR 140's at rest, pending transfer to ICU).  Verdia Glad, PT, DPT Acute Rehabilitation Services Office [phone]    Claria Crofts 02/06/2024, 11:42 AM

## 2024-02-06 NOTE — Progress Notes (Signed)
 Patient remains on CIWA, patient has been restless and agitated throughout the night. PRN and scheduled Ativan  administered per order. Patient was also placed on 2L [place] d/t desating to high 80s. Will Continue to monitor.

## 2024-02-06 NOTE — Progress Notes (Signed)
 Speech Language Pathology Treatment: Dysphagia;Cognitive-Linquistic  Patient Details Name: [name] MRN: [id number] DOB: 25-Apr-1951 Today's Date: 02/06/2024 Time: 1191-4782 SLP Time Calculation (min) (ACUTE ONLY): 8 min  Assessment / Plan / Recommendation Clinical Impression  Pt is quite lethargic and hesitant to trial POs. He was agreeable to taking sips of nectar thick liquids via straw, which were without overt s/s of dysphagia or aspiration. Attempted EMST, although pt was unsuccessful even when set to the lowest setting. He also endorses increasing SOB today, which is suspected to further impact performance. For now, recommend continuing current diet. Posted sign at [facility] to reinforce compensatory strategies. SLP will continue following.    HPI HPI: [name] is a 73 yo male presenting to ED 5/28 after being found seizing after falling down stairs. CTH shows bilateral SDH and SAH with trace IVH and non-depressed occipital skull fx. Intubated 5/28, self-extubated 5/30. Recently sustained bilateral rib fxs after a fall 5/17. PMH includes ETOH abuse s/p two liver transplants      SLP Plan  Continue with current plan of care      Recommendations for follow up therapy are one component of a multi-disciplinary discharge planning process, led by the attending physician.  Recommendations may be updated based on patient status, additional functional criteria and insurance authorization.    Recommendations  Diet recommendations: Dysphagia 2 (fine chop);Nectar-thick liquid Liquids provided via: Cup;Straw Medication Administration: Crushed with puree Supervision: Staff to assist with self feeding Compensations: Minimize environmental distractions;Slow rate;Small sips/bites;Clear throat intermittently;Multiple dry swallows after each bite/sip Postural Changes and/or Swallow Maneuvers: Seated upright 90 degrees;Upright 30-60 min after meal                  Oral care before and  after PO;Oral care BID   Frequent or constant Supervision/Assistance Cognitive communication deficit (R41.841);Dysphagia, pharyngeal phase (R13.13)     Continue with current plan of care     Amil Kale, M.A., CCC-SLP Speech Language Pathology, Acute Rehabilitation Services  Secure Chat preferred [phone]   02/06/2024, 5:02 PM

## 2024-02-07 ENCOUNTER — Other Ambulatory Visit: Payer: Self-pay

## 2024-02-07 ENCOUNTER — Inpatient Hospital Stay (HOSPITAL_COMMUNITY): Payer: Medicare (Managed Care)

## 2024-02-07 DIAGNOSIS — W19XXXA Unspecified fall, initial encounter: Secondary | ICD-10-CM | POA: Diagnosis not present

## 2024-02-07 DIAGNOSIS — I480 Paroxysmal atrial fibrillation: Secondary | ICD-10-CM | POA: Diagnosis not present

## 2024-02-07 DIAGNOSIS — S065XAA Traumatic subdural hemorrhage with loss of consciousness status unknown, initial encounter: Secondary | ICD-10-CM | POA: Diagnosis not present

## 2024-02-07 DIAGNOSIS — Z944 Liver transplant status: Secondary | ICD-10-CM | POA: Diagnosis not present

## 2024-02-07 DIAGNOSIS — R000 Tachycardia, unspecified: Secondary | ICD-10-CM

## 2024-02-07 DIAGNOSIS — I959 Hypotension, unspecified: Secondary | ICD-10-CM

## 2024-02-07 LAB — BASIC METABOLIC PANEL WITH GFR
Anion gap: 9 (ref 5–15)
BUN: 87 mg/dL — ABNORMAL HIGH (ref 8–23)
CO2: 23 mmol/L (ref 22–32)
Calcium: 8.4 mg/dL — ABNORMAL LOW (ref 8.9–10.3)
Chloride: 101 mmol/L (ref 98–111)
Creatinine, Ser: 2.08 mg/dL — ABNORMAL HIGH (ref 0.61–1.24)
GFR, Estimated: 34 mL/min — ABNORMAL LOW (ref >60)
Glucose, Bld: 117 mg/dL — ABNORMAL HIGH (ref 70–99)
Potassium: 4.5 mmol/L (ref 3.5–5.1)
Sodium: 133 mmol/L — ABNORMAL LOW (ref 135–145)

## 2024-02-07 LAB — CBC
HCT: 23.4 % — ABNORMAL LOW (ref 39.0–52.0)
Hemoglobin: 7.6 g/dL — ABNORMAL LOW (ref 13.0–17.0)
MCH: 30.4 pg (ref 26.0–34.0)
MCHC: 32.5 g/dL (ref 30.0–36.0)
MCV: 93.6 fL (ref 80.0–100.0)
Platelets: 367 10*3/uL (ref 150–400)
RBC: 2.5 MIL/uL — ABNORMAL LOW (ref 4.22–5.81)
RDW: 16.5 % — ABNORMAL HIGH (ref 11.5–15.5)
WBC: 5.7 10*3/uL (ref 4.0–10.5)
nRBC: 0 % (ref 0.0–0.2)

## 2024-02-07 LAB — GLUCOSE, CAPILLARY
Glucose-Capillary: 105 mg/dL — ABNORMAL HIGH (ref 70–99)
Glucose-Capillary: 105 mg/dL — ABNORMAL HIGH (ref 70–99)
Glucose-Capillary: 107 mg/dL — ABNORMAL HIGH (ref 70–99)
Glucose-Capillary: 115 mg/dL — ABNORMAL HIGH (ref 70–99)
Glucose-Capillary: 123 mg/dL — ABNORMAL HIGH (ref 70–99)
Glucose-Capillary: 131 mg/dL — ABNORMAL HIGH (ref 70–99)

## 2024-02-07 LAB — MAGNESIUM: Magnesium: 1.9 mg/dL (ref 1.7–2.4)

## 2024-02-07 LAB — TSH: TSH: 8.76 u[IU]/mL — ABNORMAL HIGH (ref 0.350–4.500)

## 2024-02-07 LAB — PHOSPHORUS: Phosphorus: 3.6 mg/dL (ref 2.5–4.6)

## 2024-02-07 MED ORDER — AMIODARONE LOAD VIA INFUSION
150.0000 mg | Freq: Once | INTRAVENOUS | Status: AC
  Administered 2024-02-07: 150 mg via INTRAVENOUS
  Filled 2024-02-07 (×2): qty 83.34

## 2024-02-07 MED ORDER — ACETAMINOPHEN 500 MG PO TABS
1000.0000 mg | ORAL_TABLET | Freq: Four times a day (QID) | ORAL | Status: DC
  Administered 2024-02-07 – 2024-02-19 (×45): 1000 mg
  Filled 2024-02-07 (×44): qty 2

## 2024-02-07 MED ORDER — AMIODARONE HCL IN DEXTROSE 360-4.14 MG/200ML-% IV SOLN
30.0000 mg/h | INTRAVENOUS | Status: DC
  Filled 2024-02-07: qty 200

## 2024-02-07 MED ORDER — SODIUM CHLORIDE 0.9% FLUSH: 10.0000 mL | INTRAVENOUS | Status: DC | PRN

## 2024-02-07 MED ORDER — AMIODARONE HCL IN DEXTROSE 360-4.14 MG/200ML-% IV SOLN
60.0000 mg/h | INTRAVENOUS | Status: DC
  Administered 2024-02-07: 60 mg/h via INTRAVENOUS
  Filled 2024-02-07: qty 200

## 2024-02-07 MED ORDER — AMIODARONE IV BOLUS ONLY 150 MG/100ML
150.0000 mg | Freq: Once | INTRAVENOUS | Status: AC
  Administered 2024-02-07: 150 mg via INTRAVENOUS
  Filled 2024-02-07: qty 100

## 2024-02-07 MED ORDER — DOCUSATE SODIUM 100 MG PO CAPS
100.0000 mg | ORAL_CAPSULE | Freq: Two times a day (BID) | ORAL | Status: DC
  Administered 2024-02-07 – 2024-02-10 (×2): 100 mg via ORAL
  Filled 2024-02-07 (×4): qty 1

## 2024-02-07 NOTE — Progress Notes (Signed)
 Occupational Therapy Treatment Patient Details Name: [name] MRN: [id number] DOB: 19-Sep-1950 Today's Date: 02/07/2024   History of present illness 73 yo male presenting to ED 5/28 after being found seizing after falling down stairs. CTH bil SDH and SAH with trace IVH and non-depressed occipital skull fx. Intubated 5/28, self-extubated 5/30. Recently sustained bilateral rib fxs after a fall 5/17. Imaging also showed stable subacute fractures involving the right sacral ala, right superior and inferior rami, and left parasymphyseal region along with stable superior endplate compression deformities at T3, T4, and T5 since 01/21/2024. PMH includes ETOH abuse s/p two liver transplants, 5/17 fall with R sacral ala superior and inferior rami fx, L superior endplate compression deformities at T3 T4 T5   OT comments  Pt with sustained HR 130s at rest and dangle EOB. Pt noted to have cognitive deficits and poor recall of information. Girlfriend Rice Chamorro reports she will come visit him Wednesday AM per phone call in the room. She was educated on Valet parking to help her with visit as she states she has "heart condition". She shared that they have been together for 25 years. Recommendation for Patient will benefit from intensive inpatient follow-up therapy, >3 hours/day  Call me Joe      If plan is discharge home, recommend the following:  Two people to help with bathing/dressing/bathroom;Two people to help with walking and/or transfers   Equipment Recommendations  BSC/3in1    Recommendations for Other Services Rehab consult    Precautions / Restrictions Precautions Precautions: Fall;Other (comment) Recall of Precautions/Restrictions: Impaired Precaution/Restrictions Comments: cortrak, bilateral mitts, rectal pouch Restrictions Weight Bearing Restrictions Per Provider Order: No Other Position/Activity Restrictions: Dr. Aniceto Barley confirmed no spinal precautions or weight bearing restrictions 5/31        Mobility Bed Mobility Overal bed mobility: Needs Assistance Bed Mobility: Supine to Sit     Supine to sit: HOB elevated, Mod assist, Min assist, +2 for safety/equipment     General bed mobility comments: pt needing assist for LE to move to EOB, and min-modA to trunk, modA to scoot hips to EOB and minA to steady once at EOB. pt impulsively returning trunk to sidelying, assist to LE and to reposition    Transfers                   General transfer comment: pt declined due to fatigue and pain sitting EOB, unable to redirect     Balance Overall balance assessment: Needs assistance Sitting-balance support: No upper extremity supported, Feet supported Sitting balance-Leahy Scale: Fair Sitting balance - Comments: minA to CGA, pt impulsively returning to R sidelying due to fatigue                                   ADL either performed or assessed with clinical judgement   ADL Overall ADL's : Needs assistance/impaired                                       General ADL Comments: pt transferred to EOB with increased HR and states i have to lay back down as soon as sitting. pt perservating on laying down. pt provided cell phone in bed to see if pt recalled how to access the home screen. pt is able to put in the screen pass word . start in bottom R corner  go straight up and then left one dot and back down and then over left and back up . pt initiates calling laura but does not seem to have purpose to making the call. OT speaking to Rice Chamorro about patient and she states he like cooking and cooking shows. pt does not really like music.pt likes girlfriends dog Abby. pt reports its a mutal dog. pt likes to wear hats educated girlfriend on visitation times in ICU and valet parking hours. No patient information shared as she made it clear she does not want to be his medical decision maker. she shared they have been together for 25 years    Extremity/Trunk  Assessment Upper Extremity Assessment Upper Extremity Assessment: Generalized weakness   Lower Extremity Assessment Lower Extremity Assessment: Defer to PT evaluation        Vision       Perception     Praxis     Communication Communication Communication: Impaired Factors Affecting Communication: Hearing impaired   Cognition Arousal: Alert Behavior During Therapy: Flat affect Cognition: Cognition impaired             OT - Cognition Comments: pt calling girlfiend Rice Chamorro. pt states that her friends are here and asking when she is coming home. pt asked who laura was and he was uncertain but when asked what is your girlfiends name he states Rice Chamorro. pt asked who "abby" is. pt states "abby? " pt states oh thats lauras friend. pt asked do you have a dog? pt states yes Ot ask 'what is your dogs name? Pt states "[email or web address] Scales of Cognitive Functioning: Confused, Appropriate: Moderate Assistance [VI] Following commands: Impaired Following commands impaired: Only follows one step commands consistently, Follows one step commands with increased time      Cueing   Cueing Techniques: Verbal cues, Tactile cues  Exercises      Shoulder Instructions       General Comments HR 135 supine RR 40 MAP 68    Pertinent Vitals/ Pain       Pain Assessment Pain Assessment: Faces Faces Pain Scale: Hurts a little bit Pain Location: generalized Pain Descriptors / Indicators: Grimacing  Home Living                                          Prior Functioning/Environment              Frequency  Min 2X/week        Progress Toward Goals  OT Goals(current goals can now be found in the care plan section)  Progress towards OT goals: Progressing toward goals  Acute Rehab OT Goals Patient Stated Goal: tell her to come get me OT Goal Formulation: Patient unable to participate in goal setting Time For Goal Achievement:  02/18/24 Potential to Achieve Goals: Good ADL Goals Pt Will Perform Grooming: with set-up;sitting Pt Will Perform Upper Body Bathing: with set-up;sitting Pt Will Perform Lower Body Bathing: with min assist;sit to/from stand Pt Will Transfer to Toilet: with mod assist;stand pivot transfer;bedside commode Additional ADL Goal #1: pt will complete visual scanning task with <2 errors Additional ADL Goal #2: pt will demonstrates recall of information after 10 minutes  Plan      Co-evaluation      Reason for Co-Treatment: Necessary to address cognition/behavior during functional  activity;For patient/therapist safety;To address functional/ADL transfers PT goals addressed during session: Mobility/safety with mobility;Balance OT goals addressed during session: ADL's and self-care      AM-PAC OT "6 Clicks" Daily Activity     Outcome Measure   Help from another person eating meals?: A Lot Help from another person taking care of personal grooming?: A Lot Help from another person toileting, which includes using toliet, bedpan, or urinal?: A Lot Help from another person bathing (including washing, rinsing, drying)?: A Lot Help from another person to put on and taking off regular upper body clothing?: A Lot Help from another person to put on and taking off regular lower body clothing?: Total 6 Click Score: 11    End of Session    OT Visit Diagnosis: Unsteadiness on feet (R26.81)   Activity Tolerance Patient tolerated treatment well   Patient Left in bed;with bed alarm set;with call bell/phone within reach   Nurse Communication Mobility status        Time: 1450 (1450)-1508 OT Time Calculation (min): 18 min  Charges: OT General Charges $OT Visit: 1 Visit OT Evaluation $OT Re-eval: 1 Re-eval   Brynn, OTR/L  Acute Rehabilitation Services Office: [phone] .   Neomia Banner 02/07/2024, 6:18 PM

## 2024-02-07 NOTE — Progress Notes (Signed)
  Echocardiogram 2D Echocardiogram has been attempted, physical therapy with pt.   Fain Home RDCS 02/07/2024, 2:55 PM

## 2024-02-07 NOTE — Progress Notes (Signed)
 Case d/w Dr. Rochelle Chu regarding now onset AFRVR and indications for [facility]. Recommend repeat CT head and if stable, okay to initiate AC. I would recommend heparin gtt without bolus dosing. If imaging reflects any progression, will need to speak with NSGY on call regarding AC.   Anda Bamberg, MD General and Trauma Surgery [facility] Surgery

## 2024-02-07 NOTE — Progress Notes (Signed)
 Peripherally Inserted Central Catheter Placement  The IV Nurse has discussed with the patient and/or persons authorized to consent for the patient, the purpose of this procedure and the potential benefits and risks involved with this procedure.  The benefits include less needle sticks, lab draws from the catheter, and the patient may be discharged home with the catheter. Risks include, but not limited to, infection, bleeding, blood clot (thrombus formation), and puncture of an artery; nerve damage and irregular heartbeat and possibility to perform a PICC exchange if needed/ordered by physician.  Alternatives to this procedure were also discussed.  Bard Power PICC patient education guide, fact sheet on infection prevention and patient information card has been provided to patient /or left at bedside.    PICC Placement Documentation  PICC Triple Lumen 02/07/24 Left Brachial 43 cm 0 cm (Active)  Indication for Insertion or Continuance of Line Poor Vasculature-patient has had multiple peripheral attempts or PIVs lasting less than 24 hours 02/07/24 1326  Exposed Catheter (cm) 0 cm 02/07/24 1326  Site Assessment Clean, Dry, Intact 02/07/24 1326  Lumen #1 Status Flushed;Saline locked;Blood return noted 02/07/24 1326  Lumen #2 Status Flushed;Saline locked;Blood return noted 02/07/24 1326  Lumen #3 Status Flushed;Saline locked;Blood return noted 02/07/24 1326  Dressing Type Transparent;Securing device 02/07/24 1326  Dressing Status Antimicrobial disc/dressing in place;Clean, Dry, Intact 02/07/24 1326  Line Care Connections checked and tightened 02/07/24 1326  Line Adjustment (NICU/IV Team Only) No 02/07/24 1326  Dressing Intervention New dressing;Adhesive placed at insertion site (IV team only) 02/07/24 1326  Dressing Change Due 02/14/24 02/07/24 1326    Patient's sister, Ova Meegan, signed PICC consent via telephone. Verified by 2 IV RNs.   Octavia Velador 02/07/2024, 1:27 PM

## 2024-02-07 NOTE — Progress Notes (Signed)
 Physical Therapy Treatment Patient Details Name: Dhairya Corales MRN: [id number] DOB: Apr 17, 1951 Today's Date: 02/07/2024   History of Present Illness 73 yo male presenting to ED 5/28 after being found seizing after falling down stairs. CTH bil SDH and SAH with trace IVH and non-depressed occipital skull fx. Intubated 5/28, self-extubated 5/30. Recently sustained bilateral rib fxs after a fall 5/17. Imaging also showed stable subacute fractures involving the right sacral ala, right superior and inferior rami, and left parasymphyseal region along with stable superior endplate compression deformities at T3, T4, and T5 since 01/21/2024. PMH includes ETOH abuse s/p two liver transplants, 5/17 fall with R sacral ala superior and inferior rami fx, L superior endplate compression deformities at T3 T4 T5    PT Comments  Pt agreeable to session, attempting to get in contact with significant other via phone upon arrival, was able to manage phone password and initiate call, but unable to correctly convey who was present, where he was, or what he was doing once on the phone. The pt was able to complete transition to sitting EOB with significant assist and max cues, but unable to tolerate for more than 5 min, HR 135bpm and BP soft (but stable) with MAP of 68. Pt impulsively returning to supine, declined all further standing or gait at this time. Will continue to benefit from skilled PT to progress functional strength, stability, activity tolerance, and safety awareness. Recommendations remain appropriate.    If plan is discharge home, recommend the following: Two people to help with walking and/or transfers;Two people to help with bathing/dressing/bathroom;Assistance with cooking/housework;Assistance with feeding;Direct supervision/assist for medications management;Direct supervision/assist for financial management;Assist for transportation;Help with stairs or ramp for entrance;Supervision due to cognitive status    Can travel by private vehicle        Equipment Recommendations  Rolling walker (2 wheels);BSC/3in1 (TBA further)    Recommendations for Other Services Rehab consult     Precautions / Restrictions Precautions Precautions: Fall;Other (comment) Recall of Precautions/Restrictions: Impaired Precaution/Restrictions Comments: cortrak, bilateral mitts, rectal pouch Restrictions Weight Bearing Restrictions Per Provider Order: No Other Position/Activity Restrictions: Dr. Aniceto Barley confirmed no spinal precautions or weight bearing restrictions 5/31     Mobility  Bed Mobility Overal bed mobility: Needs Assistance Bed Mobility: Supine to Sit     Supine to sit: HOB elevated, Mod assist, Min assist, +2 for safety/equipment     General bed mobility comments: pt needing assist for LE to move to EOB, and min-modA to trunk, modA to scoot hips to EOB and minA to steady once at EOB. pt impulsively returning trunk to sidelying, assist to LE and to reposition    Transfers                   General transfer comment: pt declined due to fatigue and pain sitting EOB, unable to redirect    Ambulation/Gait                   Stairs             Wheelchair Mobility     Tilt Bed    Modified Rankin (Stroke Patients Only)       Balance Overall balance assessment: Needs assistance Sitting-balance support: No upper extremity supported, Feet supported Sitting balance-Leahy Scale: Fair Sitting balance - Comments: minA to CGA, pt impulsively returning to R sidelying due to fatigue  Communication Communication Communication: Impaired Factors Affecting Communication: Hearing impaired  Cognition Arousal: Alert Behavior During Therapy: Flat affect   PT - Cognitive impairments: Orientation, Awareness, Memory, Attention, Initiation, Sequencing, Problem solving, Safety/Judgement, Rancho level   Orientation impairments: Place,  Situation               Rancho Levels of Cognitive Functioning [place]igos Scales of Cognitive Functioning: Confused, Appropriate: Moderate Assistance [email or web address] Scales of Cognitive Functioning: Confused, Appropriate: Moderate Assistance [VI] PT - Cognition Comments: pt unable to state location despite options, knew he wasnt at home when asked but when on phone with significant other the pt stated "the girls are home" repeatedly, likely referring to therapists. poor insight, awareness, impulsive. step-by-step cueing for all mobility Following commands: Impaired Following commands impaired: Only follows one step commands consistently, Follows one step commands with increased time    Cueing Cueing Techniques: Verbal cues, Tactile cues  Exercises      General Comments General comments (skin integrity, edema, etc.): HR 135bpm, BP soft but stable with MAP at low of 68      Pertinent Vitals/Pain Pain Assessment Pain Assessment: Faces Faces Pain Scale: Hurts a little bit Pain Location: generalized Pain Descriptors / Indicators: Grimacing Pain Intervention(s): Limited activity within patient's tolerance, Monitored during session, Repositioned    Home Living                          Prior Function            PT Goals (current goals can now be found in the care plan section) Acute Rehab PT Goals Patient Stated Goal: agreeable to sit up PT Goal Formulation: With patient Time For Goal Achievement: 02/18/24 Potential to Achieve Goals: Good Progress towards PT goals: Not progressing toward goals - comment    Frequency    Min 3X/week      PT Plan      Co-evaluation   Reason for Co-Treatment: Necessary to address cognition/behavior during functional activity;For patient/therapist safety;To address functional/ADL transfers PT goals addressed during session: Mobility/safety with mobility;Balance        AM-PAC PT "6 Clicks" Mobility   Outcome  Measure  Help needed turning from your back to your side while in a flat bed without using bedrails?: A Lot Help needed moving from lying on your back to sitting on the side of a flat bed without using bedrails?: Total Help needed moving to and from a bed to a chair (including a wheelchair)?: Total Help needed standing up from a chair using your arms (e.g., wheelchair or bedside chair)?: Total Help needed to walk in hospital room?: Total Help needed climbing 3-5 steps with a railing? : Total 6 Click Score: 7    End of Session Equipment Utilized During Treatment: Gait belt Activity Tolerance: Patient tolerated treatment well Patient left: with call bell/phone within reach;in bed;with bed alarm set Nurse Communication: Mobility status;Need for lift equipment PT Visit Diagnosis: Unsteadiness on feet (R26.81);Other abnormalities of gait and mobility (R26.89);Muscle weakness (generalized) (M62.81);History of falling (Z91.81);Difficulty in walking, not elsewhere classified (R26.2);Other symptoms and signs involving the nervous system (R29.898)     Time: 1450-1509 PT Time Calculation (min) (ACUTE ONLY): 19 min  Charges:    $Therapeutic Activity: 8-22 mins PT General Charges $$ ACUTE PT VISIT: 1 Visit                     Barnabas Booth, PT, DPT  Acute Rehabilitation Department Office [phone] Secure Chat Communication Preferred   Lona Rist 02/07/2024, 4:22 PM

## 2024-02-07 NOTE — Progress Notes (Signed)
 Trauma/Critical Care Follow Up Note  Subjective:    Overnight Issues:   Objective:  Vital signs for last 24 hours: Temp:  [97.5 F (36.4 C)-98.7 F (37.1 C)] 97.5 F (36.4 C) (06/03 0800) Pulse Rate:  [93-133] 122 (06/03 0700) Resp:  [17-29] 19 (06/03 0700) BP: (72-128)/(51-93) 116/67 (06/03 0700) SpO2:  [90 %-100 %] 99 % (06/03 0700) Weight:  [61.2 kg] 61.2 kg (06/03 0500)  Hemodynamic parameters for last 24 hours:    Intake/Output from previous day: 06/02 0701 - 06/03 0700 In: 2374.6 [P.O.:50; I.V.:89.7; NG/GT:1960; IV Piggyback:275] Out: 1800 [Urine:1800]  Intake/Output this shift: No intake/output data recorded.  Vent settings for last 24 hours:    Physical Exam:  Gen: comfortable, no distress Neuro: follows commands, alert, communicative HEENT: PERRL Neck: supple CV: tachycardic Pulm: unlabored breathing on [place] Abd: soft, NT  , +BM GU: urine clear and yellow, +spontaneous voids Extr: wwp, no edema  Results for orders placed or performed during the hospital encounter of 02/01/24 (from the past 24 hours)  I-STAT 7, (LYTES, BLD GAS, ICA, H+H)     Status: Abnormal   Collection Time: 02/06/24 10:46 AM  Result Value Ref Range   pH, Arterial 7.591 (H) 7.35 - 7.45   pCO2 arterial 24.2 (L) 32 - 48 mmHg   pO2, Arterial 63 (L) 83 - 108 mmHg   Bicarbonate 23.3 20.0 - 28.0 mmol/L   TCO2 24 22 - 32 mmol/L   O2 Saturation 95 %   Acid-Base Excess 2.0 0.0 - 2.0 mmol/L   Sodium 127 (L) 135 - 145 mmol/L   Potassium 4.7 3.5 - 5.1 mmol/L   Calcium, Ion 1.21 1.15 - 1.40 mmol/L   HCT 26.0 (L) 39.0 - 52.0 %   Hemoglobin 8.8 (L) 13.0 - 17.0 g/dL   Sample type ARTERIAL   CBC     Status: Abnormal   Collection Time: 02/06/24 11:02 AM  Result Value Ref Range   WBC 5.0 4.0 - 10.5 K/uL   RBC 3.30 (L) 4.22 - 5.81 MIL/uL   Hemoglobin 10.0 (L) 13.0 - 17.0 g/dL   HCT 16.1 (L) 09.6 - 04.5 %   MCV 96.4 80.0 - 100.0 fL   MCH 30.3 26.0 - 34.0 pg   MCHC 31.4 30.0 - 36.0 g/dL    RDW 40.9 (H) 81.1 - 15.5 %   Platelets 412 (H) 150 - 400 K/uL   nRBC 0.0 0.0 - 0.2 %  Comprehensive metabolic panel     Status: Abnormal   Collection Time: 02/06/24 11:02 AM  Result Value Ref Range   Sodium 130 (L) 135 - 145 mmol/L   Potassium 5.0 3.5 - 5.1 mmol/L   Chloride 100 98 - 111 mmol/L   CO2 24 22 - 32 mmol/L   Glucose, Bld 123 (H) 70 - 99 mg/dL   BUN 61 (H) 8 - 23 mg/dL   Creatinine, Ser 9.14 (H) 0.61 - 1.24 mg/dL   Calcium 8.6 (L) 8.9 - 10.3 mg/dL   Total Protein 6.5 6.5 - 8.1 g/dL   Albumin 2.3 (L) 3.5 - 5.0 g/dL   AST 19 15 - 41 U/L   ALT 14 0 - 44 U/L   Alkaline Phosphatase 147 (H) 38 - 126 U/L   Total Bilirubin 0.5 0.0 - 1.2 mg/dL   GFR, Estimated 35 (L) >60 mL/min   Anion gap 6 5 - 15  Glucose, capillary     Status: Abnormal   Collection Time: 02/06/24 11:21 AM  Result  Value Ref Range   Glucose-Capillary 138 (H) 70 - 99 mg/dL  Phosphorus     Status: Abnormal   Collection Time: 02/06/24  3:26 PM  Result Value Ref Range   Phosphorus 2.2 (L) 2.5 - 4.6 mg/dL  Glucose, capillary     Status: Abnormal   Collection Time: 02/06/24  3:55 PM  Result Value Ref Range   Glucose-Capillary 124 (H) 70 - 99 mg/dL  Glucose, capillary     Status: Abnormal   Collection Time: 02/06/24  7:53 PM  Result Value Ref Range   Glucose-Capillary 109 (H) 70 - 99 mg/dL  Glucose, capillary     Status: Abnormal   Collection Time: 02/06/24 11:45 PM  Result Value Ref Range   Glucose-Capillary 114 (H) 70 - 99 mg/dL  Glucose, capillary     Status: Abnormal   Collection Time: 02/07/24  3:42 AM  Result Value Ref Range   Glucose-Capillary 105 (H) 70 - 99 mg/dL  Phosphorus     Status: None   Collection Time: 02/07/24  6:32 AM  Result Value Ref Range   Phosphorus 3.6 2.5 - 4.6 mg/dL  Glucose, capillary     Status: Abnormal   Collection Time: 02/07/24  8:05 AM  Result Value Ref Range   Glucose-Capillary 105 (H) 70 - 99 mg/dL    Assessment & Plan: The plan of care was discussed with the  bedside nurse for the day, Brenita Callow, who is in agreement with this plan and no additional concerns were raised.   Present on Admission:  Subdural hematoma (HCC)    LOS: 6 days   Additional comments:I reviewed the patient's new clinical lab test results.   and I reviewed the patients new imaging test results.    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 Rochelle Chu  Loaded with Keppra  in ED, continue BID, F/U CT head slightly increased SDH but exam stable. TBI therapies.  Bilateral rib fractures that were sustained in a fall on 01/21/2024, similar on follow-up CT scan - multimodal pain control, pulm toilet,  Acute hypoxic ventilator dependent respiratory failure - self-extubated, was doing well, weaned to RA, now on O2, yest add flutter, chest PT, duonebs,  EtoH - CIWA. MVI, thiamine , folic acid . Dex prn, but causes hypotension Tachycardia - Cont tele. ST on EKG yest AM, but looks more like AF on the monitor this AM. Check labs and lytes. BB PRN, amio if AF FEN: D2 diet per SLP. Nocturnal TF's via cortrak. FWF VTE: PAS, SQH ID: None Foley: none Dispo: 4NP, TBI therapies. Does not have support for CIR. Likely plan SNF. Continue therapies  Critical Care Total Time: 35 minutes  Anda Bamberg, MD Trauma & General Surgery Please use [email or web address] to contact on call provider  02/07/2024  *Care during the described time interval was provided by me. I have reviewed this patient's available data, including medical history, events of note, physical examination and test results as part of my evaluation.

## 2024-02-07 NOTE — Progress Notes (Signed)
 Patient's HR sustaining in the 120s. Trauma physician notified.

## 2024-02-07 NOTE — Plan of Care (Signed)
  Problem: Clinical Measurements: Goal: Ability to maintain clinical measurements within normal limits will improve Outcome: Not Progressing Goal: Will remain free from infection Outcome: Progressing Goal: Respiratory complications will improve Outcome: Progressing   Problem: Nutrition: Goal: Adequate nutrition will be maintained Outcome: Progressing

## 2024-02-07 NOTE — Consult Note (Signed)
 Cardiology Consultation   Patient ID: [name] MRN: [id number]; DOB: 1951-07-12  Admit date: 02/01/2024 Date of Consult: 02/07/2024  PCP:  Patient, No Pcp Per   [facility] Providers Cardiologist:  None     Patient Profile: [name] is a 73 y.o. male with a hx of alcohol  abuse with hepatic failure s/p liver transplant x 2, recent traumatic falls secondary to alcohol  intoxication (01/21/2024), CKD, anemia of chronic disease who presented to the ED with another traumatic fall down 14 stairs found to have a TBI, bilateral subdural hematoma, anterior SAH, skull fracture who then began seizing is being seen 02/07/2024 for the evaluation of atrial fibrillation with RVR at the request of Dr. Aniceto Barley.  History of Present Illness: [name] has past medical history as stated above. He presented to [facility] via EMS on 02/01/2024 for traumatic fall, down 14 stairs. He was just recently discharged on 01/24/2024 for a traumatic fall secondary to alcohol  abuse where he was found to have acute fractures on rib 3-9. He was counseled on his alcohol  abuse and has already undergone two prior liver transplants.   He presented to the ED 8 days from his discharge with the most recent fall. During his admission he has been diagnosed with TBI, bilateral subdural hematoma, anterior subarachnoid hemorrhage, skull fracture as well as his continuing to heal bilateral rib fractures from the fall on 5/17.   He was unconscious and intubated upon arrival with a GCS of 3. He developed seizures and has been placed on Keppra . During his course, he ended up self-extubated and has been doing well, back to room air and then got placed back on 2 L oxygen via .   He was then found to be in atrial fibrillation with RVR, HR sustained > 120. He has since been started on amiodarone and cardiology was consulted in the setting of new A. Fib with RVR.   After speaking with the patient, he is drowsy  but able to talk with me. He denies any palpitations or shortness of breath. He says that he does not necessarily feel poorly outside of his falls. He denies any prior history of atrial fibrillation or flutter, I could not find it anywhere in his chart either.   History reviewed. No pertinent past medical history.  History reviewed. No pertinent surgical history.   Home Medications:  Prior to Admission medications   Medication Sig Start Date End Date Taking? Authorizing Provider  buPROPion (WELLBUTRIN SR) 150 MG 12 hr tablet Take 150 mg by mouth 2 (two) times daily. 12/16/23   [provider]  famotidine (PEPCID) 40 MG tablet Take 40 mg by mouth at bedtime. 12/16/23   [provider]  folic acid  (FOLVITE ) 1 MG tablet Take 1 mg by mouth daily. 01/24/24   [provider]  magnesium oxide (MAG-OX) 400 (240 Mg) MG tablet Take 1 tablet by mouth 2 (two) times daily. 12/15/23   [provider]  methocarbamol  (ROBAXIN ) 500 MG tablet Take 500 mg by mouth every 8 (eight) hours as needed. 01/24/24   [provider]  ondansetron  (ZOFRAN ) 4 MG tablet Take 4 mg by mouth every 6 (six) hours as needed. 01/24/24   [provider]  oxyCODONE  (OXY IR/ROXICODONE ) 5 MG immediate release tablet Take 5 mg by mouth every 6 (six) hours as needed for moderate pain (pain score 4-6) or severe pain (pain score 7-10). 01/24/24   [provider]  STIMULANT LAXATIVE 8.6-50 MG tablet Take  1 tablet by mouth at bedtime. 01/24/24   [provider]  tacrolimus  (PROGRAF ) 1 MG capsule Take 1 mg by mouth every 12 (twelve) hours. 12/12/23   [provider]  tamsulosin (FLOMAX) 0.4 MG CAPS capsule Take 0.4 mg by mouth daily after supper. 12/16/23   [provider]  traMADol (ULTRAM) 50 MG tablet Take 50 mg by mouth 2 (two) times daily as needed. 12/23/23   [provider]  traZODone (DESYREL) 100 MG tablet Take 100 mg by mouth at bedtime. 08/09/23    [provider]   Scheduled Meds:  acetaminophen   1,000 mg Per Tube Q6H   Chlorhexidine  Gluconate Cloth  6 each Topical Daily   docusate sodium   100 mg Oral BID   feeding supplement  237 mL Oral TID BM   folic acid   1 mg Oral Daily   heparin injection (subcutaneous)  5,000 Units Subcutaneous Q8H   levETIRAcetam   500 mg Intravenous Q12H   multivitamin with minerals  1 tablet Oral Daily   mouth rinse  15 mL Mouth Rinse Q2H   polyethylene glycol  17 g Oral Daily   tacrolimus   0.5 mg Sublingual BID   thiamine   100 mg Oral Daily   Continuous Infusions:  amiodarone 60 mg/hr (02/07/24 1350)   amiodarone     dexmedetomidine  (PRECEDEX ) IV infusion Stopped (02/07/24 0748)   feeding supplement (PIVOT 1.5 CAL) 75 mL/hr at 02/07/24 1400   PRN Meds: hydrALAZINE , ipratropium-albuterol, LORazepam  **OR** LORazepam , metoprolol  tartrate, morphine  injection, ondansetron  **OR** ondansetron  (ZOFRAN ) IV, mouth rinse, oxyCODONE , polyethylene glycol, sodium chloride  flush  Allergies:    Allergies  Allergen Reactions   Tape Other (See Comments)    Skin tears    Zolpidem Other (See Comments)    confusion   Social History:   Social History   Socioeconomic History   Marital status: Single    Spouse name: Not on file   Number of children: Not on file   Years of education: Not on file   Highest education level: Not on file  Occupational History   Not on file  Tobacco Use   Smoking status: Not on file   Smokeless tobacco: Not on file  Substance and Sexual Activity   Alcohol  use: Not on file   Drug use: Not on file   Sexual activity: Not on file  Other Topics Concern   Not on file  Social History Narrative   Not on file   Social Drivers of Health   Financial Resource Strain: Not on file  Food Insecurity: Unknown (02/05/2024)   Hunger Vital Sign    Worried About Running Out of Food in the Last Year: Patient declined    Ran Out of Food in the Last Year: Not on file  Transportation  Needs: Not on file (02/05/2024)  Physical Activity: Not on file  Stress: Not on file  Social Connections: Unknown (02/05/2024)   Social Connection and Isolation Panel [NHANES]    Frequency of Communication with Friends and Family: Patient unable to answer    Frequency of Social Gatherings with Friends and Family: Not on file    Attends Religious Services: Not on file    Active Member of Clubs or Organizations: Not on file    Attends Banker Meetings: Not on file    Marital Status: Not on file  Intimate Partner Violence: Unknown (02/05/2024)   Humiliation, Afraid, Rape, and Kick questionnaire    Fear of Current or Ex-Partner: Patient unable to answer  Emotionally Abused: Not on file    Physically Abused: Not on file    Sexually Abused: Not on file    Family History:   History reviewed. No pertinent family history.   ROS:  Please see the history of present illness.  All other ROS reviewed and negative.     Physical Exam/Data: Vitals:   02/07/24 1100 02/07/24 1200 02/07/24 1300 02/07/24 1400  BP: 96/64 111/74 100/62 (!) 92/56  Pulse: (!) 131 (!) 131 (!) 135   Resp: (!) 22 (!) 25 (!) 23 (!) 21  Temp:      TempSrc:      SpO2: 95% 95% 97%   Weight:      Height:        Intake/Output Summary (Last 24 hours) at 02/07/2024 1433 Last data filed at 02/07/2024 1400 Gross per 24 hour  Intake 3015.61 ml  Output 350 ml  Net 2665.61 ml      02/07/2024    5:00 AM 02/06/2024    5:00 AM 02/05/2024    5:00 AM  Last 3 Weights  Weight (lbs) 134 lb 14.7 oz 134 lb 4.2 oz 134 lb 7.7 oz  Weight (kg) 61.2 kg 60.9 kg 61 kg     Body mass index is 20.51 kg/m.   General:  chronically ill-appearing man, in no acute distress, in mitten restraints  HEENT: normal Neck: no JVD Vascular: Distal pulses 2+ bilaterally Cardiac:  irregularly irregular, tachycardic  Lungs:  unlabored breathing, on 2 L via [place]  Abd: soft, nontender Ext: no edema Musculoskeletal:  No deformities Skin: warm and dry   Neuro:  no focal abnormalities noted Psych:  Normal affect   EKG:  The EKG was personally reviewed and demonstrates:  atrial fibrillation with RVR  Telemetry:  Telemetry was personally reviewed and demonstrates:  atrial fibrillation, HR 130s   Relevant CV Studies: Echocardiogram, 02/07/2024 -- ordered, pending results   Laboratory Data: High Sensitivity Troponin:  No results for input(s): "TROPONINIHS" in the last 720 hours.   Chemistry Recent Labs  Lab 02/03/24 0540 02/04/24 0648 02/05/24 0527 02/05/24 0933 02/06/24 1046 02/06/24 1102  NA 134* 131*  --  135 127* 130*  K 4.1 4.0  --  4.6 4.7 5.0  CL 103 99  --  103  --  100  CO2 21* 21*  --  24  --  24  GLUCOSE 136* 121*  --  123*  --  123*  BUN 35* 51*  --  53*  --  61*  CREATININE 2.57* 2.87*  --  2.39*  --  2.00*  CALCIUM 8.5* 8.2*  --  8.7*  --  8.6*  MG 2.0 2.2 2.1  --   --   --   GFRNONAA 26* 23*  --  28*  --  35*  ANIONGAP 10 11  --  8  --  6    Recent Labs  Lab 02/01/24 1000 02/06/24 1102  PROT 5.6* 6.5  ALBUMIN 2.4* 2.3*  AST 23 19  ALT 14 14  ALKPHOS 114 147*  BILITOT 0.7 0.5   Lipids  Recent Labs  Lab 02/02/24 0436  TRIG 57    Hematology Recent Labs  Lab 02/04/24 0648 02/05/24 0933 02/06/24 1046 02/06/24 1102  WBC 9.8 6.2  --  5.0  RBC 2.69* 2.96*  --  3.30*  HGB 8.4* 8.9* 8.8* 10.0*  HCT 26.2* 28.4* 26.0* 31.8*  MCV 97.4 95.9  --  96.4  MCH 31.2 30.1  --  30.3  MCHC 32.1 31.3  --  31.4  RDW 17.0* 16.4*  --  16.3*  PLT 309 344  --  412*   Thyroid No results for input(s): "TSH", "FREET4" in the last 168 hours.  BNPNo results for input(s): "BNP", "PROBNP" in the last 168 hours.  DDimer No results for input(s): "DDIMER" in the last 168 hours.  Radiology/Studies:  US  EKG SITE RITE Result Date: 02/07/2024 If Site Rite image not attached, placement could not be confirmed due to current cardiac rhythm.  DG Chest Port 1 View Result Date: 02/06/2024 CLINICAL DATA:  Respiratory failure.  Fall  with head injury. EXAM: PORTABLE CHEST 1 VIEW COMPARISON:  Radiographs 02/02/2024 and 02/01/2024.  CT 02/01/2024. FINDINGS: 1048 hours. Interval extubation and removal of the nasogastric tube. There is a new feeding tube projecting below the diaphragm, incompletely visualized, but with the tip projecting over the left upper quadrant of the abdomen. The heart size and mediastinal contours are stable. Unchanged small pleural effusions and bibasilar pulmonary opacities. No evidence of pneumothorax. Multiple right-sided rib fractures are grossly stable. There is a fracture of the distal left clavicle as well. IMPRESSION: 1. Interval extubation and removal of the nasogastric tube. New feeding tube projecting below the diaphragm, incompletely visualized, but with the tip projecting over the left upper quadrant of the abdomen. 2. Unchanged small pleural effusions and bibasilar pulmonary opacities. 3. Multiple right-sided rib fractures and distal left clavicle fracture. Electronically Signed   By: Elmon Hagedorn M.D.   On: 02/06/2024 14:42   DG Abd Portable 1V Result Date: 02/03/2024 CLINICAL DATA:  Feeding tube placement. EXAM: PORTABLE ABDOMEN - 1 VIEW COMPARISON:  None Available. FINDINGS: Weighted enteric tube with tip and side-port below the diaphragm in the stomach. The tip is directed cranially in the region of the gastric cardia. There is barium within the stomach and scattered small bowel. IMPRESSION: Weighted enteric tube with tip and side-port below the diaphragm in the stomach. The tip is directed cranially in the region of the gastric cardia. Electronically Signed   By: Chadwick Colonel M.D.   On: 02/03/2024 17:01   DG Swallowing Func-Speech Pathology Result Date: 02/03/2024 Modified Barium Swallow Study Patient Details Name: [name] MRN: [id number] Date of Birth: Jun 09, 1951 Today's Date: 02/03/2024 HPI/PMH: HPI: Dre Gamino is a 73 yo male presenting to ED 5/28 after being found seizing  after falling down stairs. CTH shows bilateral SDH and SAH with trace IVH and non-depressed occipital skull fx. Intubated 5/28, self-extubated 5/30. Recently sustained bilateral rib fxs after a fall 5/17. PMH includes ETOH abuse s/p two liver transplants Clinical Impression: Clinical Impression: Pt exhibits moderate pharyngeal dysphagia characterized by reduced laryngeal elevation and absent epiglottic deflection, which is suspected to be acute on chronic. Suspected cervical osteophytes contribute to a collection of vallecular and pyriform sinus residue, which is only partially cleared by cued subswallows. Thin liquids reach the vocal folds during the swallow and are unable to be ejected (PAS 5). More airway invasion occurs after the swallow as a result of the collection of residue. As the penetrates sit on the vocal folds during subsequent trials, they are eventually aspirated without sensation (PAS 8). Thicker consistencies allow more time for pt to achieve laryngeal closure, reducing instances of penetration. As residue mixes within the valleculae and pyriform sinuses, it is subsequently more difficult to discern what consistency enters the laryngeal vestibule after the swallow. Suspect nectar thick liquids reach the vocal folds but are ejected (PAS 4). While honey  thick liquids do not result in penetration, they increase residue substantially. A chin tuck position (with thin and nectar thick liquids) and a R head turn (with nectar thick liquids) did not yield functional improvement. Although eructation is persistent, the esophageal sweep did not reveal any significant retention or retrograde flow. Recommending Dys 2 diet and nectar thick liquids with full supervision to cue pt to swallow multiple times and cough intermittently. Of note, pt's nose bled in small amounts two separate times throughout the study. RN notified. SLP will continue following with consideration of initiating RMT to strengthen pt's cough  after intubation. Factors that may increase risk of adverse event in presence of aspiration [name] & [name] 2021): Factors that may increase risk of adverse event in presence of aspiration [name] & [name] 2021): Reduced cognitive function; Frail or deconditioned; Inadequate oral hygiene; Weak cough Recommendations/Plan: Swallowing Evaluation Recommendations Swallowing Evaluation Recommendations Recommendations: PO diet PO Diet Recommendation: Dysphagia 2 (Finely chopped); Mildly thick liquids (Level 2, nectar thick) Liquid Administration via: Cup; Straw Medication Administration: Crushed with puree Supervision: Full assist for feeding; Full supervision/cueing for swallowing strategies Swallowing strategies  : Minimize environmental distractions; Slow rate; Small bites/sips; Multiple dry swallows after each bite/sip; Hard cough after swallowing Postural changes: Position pt fully upright for meals; Stay upright 30-60 min after meals Oral care recommendations: Oral care QID (4x/day); Oral care before PO Caregiver Recommendations: Avoid jello, ice cream, thin soups, popsicles; Remove water  pitcher Treatment Plan Treatment Plan Treatment recommendations: Therapy as outlined in treatment plan below Follow-up recommendations: Acute inpatient rehab (3 hours/day) Functional status assessment: Patient has had a recent decline in their functional status and demonstrates the ability to make significant improvements in function in a reasonable and predictable amount of time. Treatment frequency: Min 2x/week Treatment duration: 2 weeks Interventions: Aspiration precaution training; Compensatory techniques; Patient/family education; Trials of upgraded texture/liquids; Respiratory muscle strength training Recommendations Recommendations for follow up therapy are one component of a multi-disciplinary discharge planning process, led by the attending physician.  Recommendations may be updated based on patient status, additional  functional criteria and insurance authorization. Assessment: Orofacial Exam: Orofacial Exam Oral Cavity: Oral Hygiene: WFL Oral Cavity - Dentition: Missing dentition; Poor condition Orofacial Anatomy: WFL Oral Motor/Sensory Function: WFL Anatomy: Anatomy: Suspected cervical osteophytes Boluses Administered: Boluses Administered Boluses Administered: Thin liquids (Level 0); Mildly thick liquids (Level 2, nectar thick); Moderately thick liquids (Level 3, honey thick); Puree  Oral Impairment Domain: Oral Impairment Domain Lip Closure: Interlabial escape, no progression to anterior lip Tongue control during bolus hold: Cohesive bolus between tongue to palatal seal Bolus preparation/mastication: Timely and efficient chewing and mashing Bolus transport/lingual motion: Brisk tongue motion Oral residue: Complete oral clearance Location of oral residue : N/A Initiation of pharyngeal swallow : Posterior angle of the ramus  Pharyngeal Impairment Domain: Pharyngeal Impairment Domain Soft palate elevation: No bolus between soft palate (SP)/pharyngeal wall (PW) Laryngeal elevation: Partial superior movement of thyroid cartilage/partial approximation of arytenoids to epiglottic petiole Anterior hyoid excursion: Partial anterior movement Epiglottic movement: No inversion Laryngeal vestibule closure: Incomplete, narrow column air/contrast in laryngeal vestibule Pharyngeal stripping wave : Present - diminished Pharyngeal contraction (A/P view only): N/A Pharyngoesophageal segment opening: Partial distention/partial duration, partial obstruction of flow Tongue base retraction: Trace column of contrast or air between tongue base and PPW Pharyngeal residue: Collection of residue within or on pharyngeal structures Location of pharyngeal residue: Valleculae; Pyriform sinuses  Esophageal Impairment Domain: Esophageal Impairment Domain Esophageal clearance upright position: Complete clearance, esophageal coating Pill:  No data recorded  Penetration/Aspiration Scale Score: Penetration/Aspiration Scale Score 1.  Material does not enter airway: Moderately thick liquids (Level 3, honey thick); Puree 4.  Material enters airway, CONTACTS cords then ejected out: Mildly thick liquids (Level 2, nectar thick) 8.  Material enters airway, passes BELOW cords without attempt by patient to eject out (silent aspiration) : Thin liquids (Level 0) Compensatory Strategies: Compensatory Strategies Compensatory strategies: Yes Straw: Ineffective Ineffective Straw: Thin liquid (Level 0); Mildly thick liquid (Level 2, nectar thick) Multiple swallows: Effective Effective Multiple Swallows: Mildly thick liquid (Level 2, nectar thick); Moderately thick liquid (Level 3, honey thick); Puree Chin tuck: Ineffective Ineffective Chin Tuck: Thin liquid (Level 0); Mildly thick liquid (Level 2, nectar thick) Right head turn: Ineffective Ineffective Right Head Turn: Mildly thick liquid (Level 2, nectar thick)   General Information: Caregiver present: No  Diet Prior to this Study: NPO   Temperature : Normal   Respiratory Status: WFL   Supplemental O2: None (Room air)   History of Recent Intubation: Yes  Behavior/Cognition: Alert; Cooperative; Requires cueing Self-Feeding Abilities: Needs assist with self-feeding Baseline vocal quality/speech: Dysphonic Volitional Cough: Able to elicit Volitional Swallow: Able to elicit Exam Limitations: No limitations Goal Planning: Prognosis for improved oropharyngeal function: Good Barriers to Reach Goals: Cognitive deficits; Time post onset No data recorded Patient/Family Stated Goal: none stated Consulted and agree with results and recommendations: Patient Pain: Pain Assessment Pain Assessment: No/denies pain Facial Expression: 0 Body Movements: 0 Muscle Tension: 0 Compliance with ventilator (intubated pts.): 0 Vocalization (extubated pts.): N/A CPOT Total: 0 End of Session: Start Time:SLP Start Time (ACUTE ONLY): 1425 Stop Time: SLP Stop Time  (ACUTE ONLY): 1445 Time Calculation:SLP Time Calculation (min) (ACUTE ONLY): 20 min Charges: SLP Evaluations $ SLP Speech Visit: 1 Visit SLP Evaluations $BSS Swallow: 1 Procedure $MBS Swallow: 1 Procedure SLP visit diagnosis: SLP Visit Diagnosis: Dysphagia, pharyngeal phase (R13.13) Past Medical History: No past medical history on file. Past Surgical History: No past surgical history on file. Amil Kale, M.A., CCC-SLP Speech Language Pathology, Acute Rehabilitation Services Secure Chat preferred [phone] 02/03/2024, 3:33 PM  Assessment and Plan: New onset atrial fibrillation with RVR Presented to ED 5/28 with traumatic fall resulting in skull fracture, bilateral subdural hematoma, anterior SAH Recently discharged 5/20 for traumatic fall resulting in rib 3-9 fracture History of alcohol  abuse Found to be in atrial fibrillation with RVR  No known history of A. Fib  Currently in A. Fib, with HR 130s  Started on IV amiodarone per primary team  Not a candidate for anticoagulation given recent falls, head bleed, an alcoholism  Not well rate controlled at the moment, but BP 90/50s and poor renal function there are not many other options, amiodarone not the best option due to risk of conversion while not on [facility], will discuss with MD  Ordered echocardiogram and TSH  Per primary TBI, bilateral subdural hematoma, anterior SAH, skull fracture Bilateral rib fractures from prior fall Acute hypoxic respiratory failure ETOH abuse  Risk Assessment/Risk Scores:      CHA2DS2-VASc Score = 2   This indicates a 2.2% annual risk of stroke. The patient's score is based upon: CHF History: 0 HTN History: 1 Diabetes History: 0 Stroke History: 0 Vascular Disease History: 0 Age Score: 1 Gender Score: 0      For questions or updates, please contact [facility] Please consult [email or web address] for contact info under    Signed, Jiles Mote, PA-C  02/07/2024 2:33 PM

## 2024-02-08 ENCOUNTER — Other Ambulatory Visit (HOSPITAL_COMMUNITY): Payer: Medicare (Managed Care)

## 2024-02-08 ENCOUNTER — Inpatient Hospital Stay (HOSPITAL_COMMUNITY): Payer: Medicare (Managed Care)

## 2024-02-08 DIAGNOSIS — E43 Unspecified severe protein-calorie malnutrition: Secondary | ICD-10-CM | POA: Insufficient documentation

## 2024-02-08 DIAGNOSIS — I4891 Unspecified atrial fibrillation: Secondary | ICD-10-CM | POA: Diagnosis not present

## 2024-02-08 DIAGNOSIS — Z944 Liver transplant status: Secondary | ICD-10-CM | POA: Diagnosis not present

## 2024-02-08 DIAGNOSIS — I480 Paroxysmal atrial fibrillation: Secondary | ICD-10-CM | POA: Diagnosis not present

## 2024-02-08 DIAGNOSIS — R000 Tachycardia, unspecified: Secondary | ICD-10-CM | POA: Diagnosis not present

## 2024-02-08 DIAGNOSIS — S065XAA Traumatic subdural hemorrhage with loss of consciousness status unknown, initial encounter: Secondary | ICD-10-CM | POA: Diagnosis not present

## 2024-02-08 LAB — CBC
HCT: 22.2 % — ABNORMAL LOW (ref 39.0–52.0)
Hemoglobin: 7.1 g/dL — ABNORMAL LOW (ref 13.0–17.0)
MCH: 30.2 pg (ref 26.0–34.0)
MCHC: 32 g/dL (ref 30.0–36.0)
MCV: 94.5 fL (ref 80.0–100.0)
Platelets: 373 10*3/uL (ref 150–400)
RBC: 2.35 MIL/uL — ABNORMAL LOW (ref 4.22–5.81)
RDW: 17.1 % — ABNORMAL HIGH (ref 11.5–15.5)
WBC: 3.9 10*3/uL — ABNORMAL LOW (ref 4.0–10.5)
nRBC: 0 % (ref 0.0–0.2)

## 2024-02-08 LAB — ECHOCARDIOGRAM COMPLETE
AR max vel: 1.72 cm2
AV Area VTI: 2.03 cm2
AV Area mean vel: 1.87 cm2
AV Mean grad: 8 mmHg
AV Peak grad: 15.2 mmHg
Ao pk vel: 1.95 m/s
Height: 68 in
S' Lateral: 2.5 cm
Weight: 2158.74 [oz_av]

## 2024-02-08 LAB — BASIC METABOLIC PANEL WITH GFR
Anion gap: 9 (ref 5–15)
BUN: 95 mg/dL — ABNORMAL HIGH (ref 8–23)
CO2: 23 mmol/L (ref 22–32)
Calcium: 8.4 mg/dL — ABNORMAL LOW (ref 8.9–10.3)
Chloride: 102 mmol/L (ref 98–111)
Creatinine, Ser: 2.1 mg/dL — ABNORMAL HIGH (ref 0.61–1.24)
GFR, Estimated: 33 mL/min — ABNORMAL LOW (ref >60)
Glucose, Bld: 120 mg/dL — ABNORMAL HIGH (ref 70–99)
Potassium: 4.3 mmol/L (ref 3.5–5.1)
Sodium: 134 mmol/L — ABNORMAL LOW (ref 135–145)

## 2024-02-08 LAB — T4, FREE: Free T4: 0.73 ng/dL (ref 0.61–1.12)

## 2024-02-08 LAB — GLUCOSE, CAPILLARY
Glucose-Capillary: 118 mg/dL — ABNORMAL HIGH (ref 70–99)
Glucose-Capillary: 103 mg/dL — ABNORMAL HIGH (ref 70–99)
Glucose-Capillary: 93 mg/dL (ref 70–99)
Glucose-Capillary: 107 mg/dL — ABNORMAL HIGH (ref 70–99)
Glucose-Capillary: 97 mg/dL (ref 70–99)

## 2024-02-08 LAB — PHOSPHORUS: Phosphorus: 2.2 mg/dL — ABNORMAL LOW (ref 2.5–4.6)

## 2024-02-08 MED ORDER — OSMOLITE 1.5 CAL PO LIQD
1000.0000 mL | ORAL | Status: DC
  Administered 2024-02-08 – 2024-02-10 (×3): 1000 mL

## 2024-02-08 MED ORDER — PROSOURCE TF20 ENFIT COMPATIBL EN LIQD
60.0000 mL | Freq: Two times a day (BID) | ENTERAL | Status: DC
  Administered 2024-02-08 – 2024-02-11 (×7): 60 mL
  Filled 2024-02-08 (×6): qty 60

## 2024-02-08 MED ORDER — SODIUM PHOSPHATES 45 MMOLE/15ML IV SOLN
15.0000 mmol | Freq: Once | INTRAVENOUS | Status: AC
  Administered 2024-02-08: 15 mmol via INTRAVENOUS
  Filled 2024-02-08: qty 5

## 2024-02-08 NOTE — Progress Notes (Addendum)
 Nutrition Follow-up  DOCUMENTATION CODES:   Severe malnutrition in context of chronic illness  INTERVENTION:   Discussed with MD during rounds, will transition back to continuous TF due to very poor intake.   Nocturnal feedings via Cortrak tube: Pivot 1.5 @ 75 ml/hr x 16 hours   Resume continuous TF Osmolite 1.5 @ 55 ml/hr 60 ml ProSource TF20 BID  Provides: 2140 kcal, 122 grams protein, and 1003 ml free water    Continue:  100 mg thiamine  daily MVI with minerals daily 1 mg folic acid  daily Daily weights  -Ensure Plus High Protein po TID, each supplement provides 350 kcal and 20 grams of protein. (Pt has refused but reports he will try chocolate)   NUTRITION DIAGNOSIS:   Severe Malnutrition related to chronic illness as evidenced by severe fat depletion, severe muscle depletion. Ongoing  GOAL:   Patient will meet greater than or equal to 90% of their needs Met with TF  MONITOR:   TF tolerance  REASON FOR ASSESSMENT:   Consult Enteral/tube feeding initiation and management  ASSESSMENT:   Pt with PMH of ETOH abuse s/p 2 liver transplants admitted after fall, found at the bottom of the stairs, admitted with TBI/bilateral SDH, anterior SAH, skull fx, and bilateral rib fxs (from 5/17 fall).  Pt discussed during ICU rounds and with RN and MD.  Noted TSH elevated, further labs pending Per MD plan is likely SNF at discharge. Noted in AF with cards consulted.   Spoke with pt, girlfriend and SLP who are at bedside. Pt remains on Dysphagia 2 with nectar thickened liquids. Per SLP dysphagia may be chronic.  Pt reports no appetite which was present PTA. Pt confused but does state he would try chocolate ensure today. Discussed this with girlfriend.    Per PT notes, pt somewhat confused to current situation. Noted meal completion 25% of meals. Breakfast this am he did not eat. Also refusing solids with SLP.   Remains on nocturnal TF.    5/30- cortrak placed (gastric),  s/p MBSS- dysphagia 2 diet with nectar thick liquids 5/31- s/p BSE- dysphagia 2 diet with nectar thick liquids    Medications reviewed and include: colace, ensure plus high protein, folic acid , MVI with minerals, miralax , thiamine  Precedex  Pivot 1.5 @ 75 ml/hr x 16 hours Naphos 15 mmol x 1   Labs reviewed:  Phos  1.8 -> 1.4 -> 2.2-> 3.6 -> 2.2 TSH 8.760 CBG's: 103-118  UOP 1075 ml  I&O +4837 ml   Diet Order:   Diet Order             DIET DYS 2 Room service appropriate? Yes with Assist; Fluid consistency: Nectar Thick  Diet effective now                   EDUCATION NEEDS:   Not appropriate for education at this time  Skin:  Skin Assessment: Skin Integrity Issues: Skin Integrity Issues:: Other (Comment) Other: head laceration, skin tear to lt lower arm  Last BM:  6/3 type 6  Height:   Ht Readings from Last 1 Encounters:  02/01/24 5\' 8"  (1.727 m)    Weight:   Wt Readings from Last 1 Encounters:  02/07/24 61.2 kg   BMI:  Body mass index is 20.51 kg/m.  Estimated Nutritional Needs:   Kcal:  1800-2000  Protein:  95-115 grams  Fluid:  > 1.8 L/day  Randine Butcher., RD, LDN, CNSC See AMiON for contact information

## 2024-02-08 NOTE — TOC Progression Note (Signed)
 Transition of Care [facility]) - Progression Note    Patient Details  Name: [name] MRN: [id number] Date of Birth: 1950-09-20  Transition [facility]) CM/SW Contact  Jannice Mends, LCSW Phone Number: 02/08/2024, 9:36 AM  Clinical Narrative:    CSW continuing to follow for SNF referrals once cortrak is removed.    Expected Discharge Plan: Skilled Nursing Facility Barriers to Discharge: Continued Medical Work up  Expected Discharge Plan and Services In-house Referral: Clinical Social Work     Living arrangements for the past 2 months: Single Family Home                                       Social Determinants of Health (SDOH) Interventions SDOH Screenings   Food Insecurity: Unknown (02/05/2024)  Housing: Unknown (02/05/2024)  Utilities: Patient Unable To Answer (02/05/2024)  Social Connections: Unknown (02/05/2024)    Readmission Risk Interventions     No data to display

## 2024-02-08 NOTE — Progress Notes (Signed)
 Occupational Therapy Treatment Patient Details Name: [name] MRN: [id number] DOB: 10-02-1950 Today's Date: 02/08/2024   History of present illness 73 yo male presenting to ED 5/28 after being found seizing after falling down stairs. CTH bil SDH and SAH with trace IVH and non-depressed occipital skull fx. Intubated 5/28, self-extubated 5/30. Recently sustained bilateral rib fxs after a fall 5/17. Imaging also showed stable subacute fractures involving the right sacral ala, right superior and inferior rami, and left parasymphyseal region along with stable superior endplate compression deformities at T3, T4, and T5 since 01/21/2024. PMH includes ETOH abuse s/p two liver transplants, 5/17 fall with R sacral ala superior and inferior rami fx, L superior endplate compression deformities at T3 T4 T5   OT comments  Pt agreeable to OT session and with good progression toward established OT goals. Pt max soiled on arrival; needing total A for clean up but with fair ability to roll in bed with min A and cues for sequence. Pt states he was aware of this and apologizes for needing clean-up, but had not reported BM to staff. Pt with improved orientation to place, month, year this session. Following one step commands throughout. Able to perform STS from EOB with min-mod A +2 and side steps to [facility] with min A as well. Will continue to follow acutely; inpatient rehab >3 hours/day highly recommended to optimize return to PLOF given pt motivation and good progression.       If plan is discharge home, recommend the following:  Two people to help with bathing/dressing/bathroom;Two people to help with walking and/or transfers   Equipment Recommendations  BSC/3in1    Recommendations for Other Services      Precautions / Restrictions Precautions Precautions: Fall;Other (comment) Recall of Precautions/Restrictions: Impaired Precaution/Restrictions Comments: cortrak, bilateral mitts, rectal  pouch Restrictions Weight Bearing Restrictions Per Provider Order: No Other Position/Activity Restrictions: Dr. Aniceto Barley confirmed no spinal precautions or weight bearing restrictions 5/31       Mobility Bed Mobility Overal bed mobility: Needs Assistance Bed Mobility: Rolling, Sidelying to Sit, Sit to Supine Rolling: Min assist, Used rails Sidelying to sit: Min assist, +2 for physical assistance, Used rails   Sit to supine: Mod assist, +2 for physical assistance   General bed mobility comments: pt following cues to roll with minA to complete movement and use of bed rails. assisting with transition to sit. modA to bring LE into bed and reposition    Transfers Overall transfer level: Needs assistance Equipment used: 2 person hand held assist Transfers: Sit to/from Stand, Bed to chair/wheelchair/BSC Sit to Stand: Mod assist, Min assist, +2 physical assistance           General transfer comment: initially needing modA with strong posterior lean, then progressed to minA of 2 on 2nd attempt. limited anterior wt shift. cues for hand placement     Balance Overall balance assessment: Needs assistance Sitting-balance support: No upper extremity supported, Feet supported Sitting balance-Leahy Scale: Fair Sitting balance - Comments: minA to CGA   Standing balance support: Bilateral upper extremity supported, During functional activity Standing balance-Leahy Scale: Poor Standing balance comment: reliant on bil UE support and modAx2                           ADL either performed or assessed with clinical judgement   ADL Overall ADL's : Needs assistance/impaired     Grooming: Contact guard assist;Wash/dry face;Sitting  Toilet Transfer: +2 for physical assistance;+2 for safety/equipment;Minimal assistance;Stand-pivot   Toileting- Clothing Manipulation and Hygiene: Total assistance;+2 for physical assistance;+2 for safety/equipment Toileting -  Clothing Manipulation Details (indicate cue type and reason): soiled on arrival     Functional mobility during ADLs: Minimal assistance;+2 for physical assistance;+2 for safety/equipment      Extremity/Trunk Assessment Upper Extremity Assessment Upper Extremity Assessment: Generalized weakness   Lower Extremity Assessment Lower Extremity Assessment: Defer to PT evaluation        Vision   Vision Assessment?: Vision impaired- to be further tested in functional context;No apparent visual deficits Additional Comments: visually scans L and R but seems to do so more easily to L   Perception     Praxis     Communication Communication Communication: Impaired Factors Affecting Communication: Hearing impaired   Cognition Arousal: Alert Behavior During Therapy: Flat affect Cognition: Cognition impaired     Awareness: Intellectual awareness impaired, Online awareness impaired   Attention impairment (select first level of impairment): Sustained attention Executive functioning impairment (select all impairments): Sequencing, Organization OT - Cognition Comments: much improved cognition as compared to prior session, however, pt found soiled and had not reported to RN to use restroom. Pt oriented to month, year, place, but with inconsistencies in report of events today and yesterday. only follows one step commands. some awareness, reporting "im sorry yall have to clean me up like this[address] Scales of Cognitive Functioning: Confused, Appropriate: Moderate Assistance [VI] Following commands: Impaired Following commands impaired: Only follows one step commands consistently, Follows one step commands with increased time      Cueing   Cueing Techniques: Verbal cues, Tactile cues  Exercises      Shoulder Instructions       General Comments HR to 120bpm, rectal pouch leaked, pt soiled of BM and cleaned up. noted skin breakdown under rectal pouch with RN  in room removing rectal pouch and examining skin    Pertinent Vitals/ Pain       Pain Assessment Pain Assessment: Faces Faces Pain Scale: Hurts little more Pain Location: skin breakdown near rectal pouch Pain Descriptors / Indicators: Grimacing Pain Intervention(s): Limited activity within patient's tolerance, Monitored during session  Home Living                                          Prior Functioning/Environment              Frequency  Min 2X/week        Progress Toward Goals  OT Goals(current goals can now be found in the care plan section)  Progress towards OT goals: Progressing toward goals  Acute Rehab OT Goals OT Goal Formulation: Patient unable to participate in goal setting Time For Goal Achievement: 02/18/24 Potential to Achieve Goals: Good ADL Goals Pt Will Perform Grooming: with set-up;sitting Pt Will Perform Upper Body Bathing: with set-up;sitting Pt Will Perform Lower Body Bathing: with min assist;sit to/from stand Pt Will Transfer to Toilet: with mod assist;stand pivot transfer;bedside commode Additional ADL Goal #1: pt will complete visual scanning task with <2 errors Additional ADL Goal #2: pt will demonstrates recall of information after 10 minutes  Plan      Co-evaluation    PT/OT/SLP Co-Evaluation/Treatment: Yes Reason for Co-Treatment: Necessary to address cognition/behavior during functional activity;For  patient/therapist safety;To address functional/ADL transfers PT goals addressed during session: Mobility/safety with mobility;Balance OT goals addressed during session: ADL's and self-care      AM-PAC OT "6 Clicks" Daily Activity     Outcome Measure   Help from another person eating meals?: A Lot Help from another person taking care of personal grooming?: A Little Help from another person toileting, which includes using toliet, bedpan, or urinal?: A Lot Help from another person bathing (including washing, rinsing,  drying)?: A Lot Help from another person to put on and taking off regular upper body clothing?: A Lot Help from another person to put on and taking off regular lower body clothing?: Total 6 Click Score: 12    End of Session Equipment Utilized During Treatment: Gait belt  OT Visit Diagnosis: Unsteadiness on feet (R26.81)   Activity Tolerance Patient tolerated treatment well   Patient Left in bed;with bed alarm set;with call bell/phone within reach   Nurse Communication Mobility status        Time: 8295-6213 OT Time Calculation (min): 27 min  Charges: OT General Charges $OT Visit: 1 Visit OT Treatments $Self Care/Home Management : 8-22 mins  Emery Hans, OTD, OTR/L [facility] Acute Rehabilitation Office: [phone]   Emery Hans 02/08/2024, 4:49 PM

## 2024-02-08 NOTE — Progress Notes (Signed)
   Rounding Note    Patient Name: [name] Date of Encounter: 02/08/2024  [facility] Cardiologist: None   Subjective   Family at bedside. Discussed afib, now in sinus, recommendations for management, see below. Patient's concern is when he can go home.  Inpatient Medications    Scheduled Meds:  acetaminophen   1,000 mg Per Tube Q6H   Chlorhexidine  Gluconate Cloth  6 each Topical Daily   docusate sodium   100 mg Oral BID   feeding supplement  237 mL Oral TID BM   folic acid   1 mg Oral Daily   heparin injection (subcutaneous)  5,000 Units Subcutaneous Q8H   multivitamin with minerals  1 tablet Oral Daily   mouth rinse  15 mL Mouth Rinse Q2H   polyethylene glycol  17 g Oral Daily   tacrolimus   0.5 mg Sublingual BID   thiamine   100 mg Oral Daily   Continuous Infusions:  dexmedetomidine  (PRECEDEX ) IV infusion Stopped (02/08/24 0943)   feeding supplement (PIVOT 1.5 CAL) 75 mL/hr at 02/08/24 1000   sodium PHOSPHATE IVPB (in mmol)     PRN Meds: hydrALAZINE , ipratropium-albuterol, LORazepam  **OR** LORazepam , metoprolol  tartrate, morphine  injection, ondansetron  **OR** ondansetron  (ZOFRAN ) IV, mouth rinse, oxyCODONE , polyethylene glycol, sodium chloride  flush   Vital Signs    Vitals:   02/08/24 0754 02/08/24 0800 02/08/24 0900 02/08/24 1000  BP:  100/61 113/66 (!) 94/59  Pulse:   (!) 104 95  Resp:  14 19 12   Temp: 97.8 F (36.6 C)     TempSrc: Oral     SpO2:   97% 100%  Weight:      Height:        Intake/Output Summary (Last 24 hours) at 02/08/2024 1146 Last data filed at 02/08/2024 1000 Gross per 24 hour  Intake 2044.3 ml  Output 1075 ml  Net 969.3 ml      02/07/2024    5:00 AM 02/06/2024    5:00 AM 02/05/2024    5:00 AM  Last 3 Weights  Weight (lbs) 134 lb 14.7 oz 134 lb 4.2 oz 134 lb 7.7 oz  Weight (kg) 61.2 kg 60.9 kg 61 kg      Telemetry    Sinus tach - Personally Reviewed  Physical Exam   GEN: Frail, chronically ill appearing man  Neck: No  JVD Cardiac: tachycardic but RRR, no murmurs, rubs, or gallops.  Respiratory: Clear to auscultation anterior/lateral GI: Soft, nontender, non-distended  MS: No edema; No deformity. Neuro:  Nonfocal  Psych: Normal affect   New pertinent results (labs, ECG, imaging, cardiac studies)    Echo images personally reviewed: small pericardial effusion without tamponade, hyperdynamic LV, normal RV, no severe valve disease--aortic regurgitation present but not severe (formal read pending)  Assessment & Plan    Paroxysmal atrial fibrillation: brief, in the setting of severe illness -now in sinus tach -off amiodarone -we discussed anticoagulation. I would NOT anticoagulate at this time. Discussed that if afib recurs in the future, this would need to be an indepth discussion given his alcohol  use, frequent falls, and history of liver transplant -echo reassuring  Falls resulting in rib fracture, subdural hematomas, and subarachnoid hemorrhage: management per primary team  Cardiology will sign off at this time. He does not need routine cardiology follow up at this time, but please contact us  with new questions or concerns.    Signed, Sheryle Donning, MD  02/08/2024, 11:46 AM

## 2024-02-08 NOTE — Progress Notes (Addendum)
 Speech Language Pathology Treatment: Dysphagia;Cognitive-Linquistic  Patient Details Name: [name] MRN: [id number] DOB: 1951/06/28 Today's Date: 02/08/2024 Time: 8119-1478 SLP Time Calculation (min) (ACUTE ONLY): 22 min  Assessment / Plan / Recommendation Clinical Impression  Pt was more alert today with his significant other present throughout the session. He is oriented to self, place, and year but repeatedly states he is 73. Given errorless learning efforts, pt was able to effectively carry over his age by the end of the session. Safety mitts were removed and pt independently completed oral care and fed himself trials of nectar thick liquids. He follows commands to clear his throat and swallow x2 per bolus. He continues to decline solids of any kind, which his significant other states is consistent with his baseline. Pt completed EMST set to 15 cm/H2O x10 given Mod cueing. Reviewed MBS images with pt and his significant other and provided education. Discussed with RD, who was present at the end of the session and will continue following to address goals related to dysphagia and cognition.    HPI HPI: [name] is a 73 yo male presenting to ED 5/28 after being found seizing after falling down stairs. CTH shows bilateral SDH and SAH with trace IVH and non-depressed occipital skull fx. Intubated 5/28, self-extubated 5/30. Recently sustained bilateral rib fxs after a fall 5/17. PMH includes ETOH abuse s/p two liver transplants      SLP Plan  Continue with current plan of care      Recommendations for follow up therapy are one component of a multi-disciplinary discharge planning process, led by the attending physician.  Recommendations may be updated based on patient status, additional functional criteria and insurance authorization.    Recommendations  Diet recommendations: Dysphagia 2 (fine chop);Nectar-thick liquid Liquids provided via: Cup;Straw Medication Administration:  Crushed with puree Supervision: Staff to assist with self feeding Compensations: Minimize environmental distractions;Slow rate;Small sips/bites;Clear throat intermittently;Multiple dry swallows after each bite/sip Postural Changes and/or Swallow Maneuvers: Seated upright 90 degrees;Upright 30-60 min after meal                  Oral care before and after PO;Oral care BID   Frequent or constant Supervision/Assistance Cognitive communication deficit (R41.841);Dysphagia, pharyngeal phase (R13.13)     Continue with current plan of care     [name], M.A., CCC-SLP Speech Language Pathology, Acute Rehabilitation Services  Secure Chat preferred [phone]   02/08/2024, 10:58 AM

## 2024-02-08 NOTE — Progress Notes (Signed)
 Physical Therapy Treatment Patient Details Name: [name] MRN: [id number] DOB: May 23, 1951 Today's Date: 02/08/2024   History of Present Illness 73 yo male presenting to ED 5/28 after being found seizing after falling down stairs. CTH bil SDH and SAH with trace IVH and non-depressed occipital skull fx. Intubated 5/28, self-extubated 5/30. Recently sustained bilateral rib fxs after a fall 5/17. Imaging also showed stable subacute fractures involving the right sacral ala, right superior and inferior rami, and left parasymphyseal region along with stable superior endplate compression deformities at T3, T4, and T5 since 01/21/2024. PMH includes ETOH abuse s/p two liver transplants, 5/17 fall with R sacral ala superior and inferior rami fx, L superior endplate compression deformities at T3 T4 T5    PT Comments  Pt agreeable to session, reports no recall of PT session yesterday. Pt was able to demo improved orientation this afternoon, but was found to be soiled of BM and pt states aware but had not alerted staff. He did follow commands for bed mobility well, needing only minA and rails to complete rolling and modA to complete transition to sitting EOB. Pt then agreeable to complete sit-stand, dependent on modA of 2 to complete with bilateral HHA to manage power up to standing and standing balance. No overt buckling, even with lateral stepping. Ambulation limited by general activity tolerance, but pt making good progress with mobility compared to last session. Continue to recommend intensive therapies after medically stable for d/c.     If plan is discharge home, recommend the following: Two people to help with walking and/or transfers;Two people to help with bathing/dressing/bathroom;Assistance with cooking/housework;Assistance with feeding;Direct supervision/assist for medications management;Direct supervision/assist for financial management;Assist for transportation;Help with stairs or ramp for  entrance;Supervision due to cognitive status   Can travel by private vehicle        Equipment Recommendations  Rolling walker (2 wheels);BSC/3in1 (TBA further)    Recommendations for Other Services Rehab consult     Precautions / Restrictions Precautions Precautions: Fall;Other (comment) Recall of Precautions/Restrictions: Impaired Precaution/Restrictions Comments: cortrak, bilateral mitts, rectal pouch Restrictions Weight Bearing Restrictions Per Provider Order: No Other Position/Activity Restrictions: Dr. Aniceto Barley confirmed no spinal precautions or weight bearing restrictions 5/31     Mobility  Bed Mobility Overal bed mobility: Needs Assistance Bed Mobility: Rolling, Sidelying to Sit, Sit to Supine Rolling: Min assist, Used rails Sidelying to sit: Min assist, +2 for physical assistance, Used rails   Sit to supine: Mod assist, +2 for physical assistance   General bed mobility comments: pt following cues to roll with minA to complete movement and use of bed rails. assisting with transition to sit. modA to bring LE into bed and reposition    Transfers Overall transfer level: Needs assistance Equipment used: 2 person hand held assist Transfers: Sit to/from Stand, Bed to chair/wheelchair/BSC Sit to Stand: Mod assist, Min assist, +2 physical assistance           General transfer comment: initially needing modA with strong posterior lean, then progressed to minA of 2 on 2nd attempt. limited anterior wt shift. cues for hand placement    Ambulation/Gait Ambulation/Gait assistance: Mod assist, +2 physical assistance, +2 safety/equipment Gait Distance (Feet): 5 Feet Assistive device: 2 person hand held assist Gait Pattern/deviations: Step-to pattern, Decreased step length - right, Decreased step length - left, Decreased stride length, Decreased dorsiflexion - right, Decreased dorsiflexion - left, Trunk flexed, Shuffle Gait velocity: reduced Gait velocity interpretation: <1.31  ft/sec, indicative of household ambulator   General Gait Details:  small lateral steps then forwards and back, small steps with poor clearance adn maintains flexed posture. BUE support   Stairs             Wheelchair Mobility     Tilt Bed    Modified Rankin (Stroke Patients Only)       Balance Overall balance assessment: Needs assistance Sitting-balance support: No upper extremity supported, Feet supported Sitting balance-Leahy Scale: Fair Sitting balance - Comments: minA to CGA   Standing balance support: Bilateral upper extremity supported, During functional activity Standing balance-Leahy Scale: Poor Standing balance comment: reliant on bil UE support and modAx2                            Communication Communication Communication: Impaired Factors Affecting Communication: Hearing impaired  Cognition Arousal: Alert Behavior During Therapy: Flat affect   PT - Cognitive impairments: Orientation, Awareness, Memory, Attention, Initiation, Sequencing, Problem solving, Safety/Judgement, Rancho level   Orientation impairments: Place, Situation               Rancho Levels of Cognitive Functioning Rancho Los Amigos Scales of Cognitive Functioning: Confused, Appropriate: Moderate Assistance [email or web address] Scales of Cognitive Functioning: Confused, Appropriate: Moderate Assistance [VI] PT - Cognition Comments: pt able to state location as hospital, and per OT oriented to date. pt reports he was aware of BM but did nto call or alert anyone. able to follow simple commands in session Following commands: Impaired Following commands impaired: Only follows one step commands consistently, Follows one step commands with increased time    Cueing Cueing Techniques: Verbal cues, Tactile cues  Exercises      General Comments General comments (skin integrity, edema, etc.): HR to 120bpm, rectal pouch leaked, pt soiled of BM and cleaned up. noted skin breakdown  under rectal pouch      Pertinent Vitals/Pain Pain Assessment Pain Assessment: Faces Faces Pain Scale: Hurts little more Pain Location: skin breakdown near rectal pouch Pain Descriptors / Indicators: Grimacing Pain Intervention(s): Limited activity within patient's tolerance, Monitored during session, Repositioned    Home Living                          Prior Function            PT Goals (current goals can now be found in the care plan section) Acute Rehab PT Goals Patient Stated Goal: agreeable to sit up PT Goal Formulation: With patient Time For Goal Achievement: 02/18/24 Potential to Achieve Goals: Good Progress towards PT goals: Progressing toward goals    Frequency    Min 3X/week      PT Plan      Co-evaluation   Reason for Co-Treatment: Necessary to address cognition/behavior during functional activity;For patient/therapist safety;To address functional/ADL transfers PT goals addressed during session: Mobility/safety with mobility;Balance        AM-PAC PT "6 Clicks" Mobility   Outcome Measure  Help needed turning from your back to your side while in a flat bed without using bedrails?: A Little Help needed moving from lying on your back to sitting on the side of a flat bed without using bedrails?: A Lot Help needed moving to and from a bed to a chair (including a wheelchair)?: A Lot Help needed standing up from a chair using your arms (e.g., wheelchair or bedside chair)?: Total Help needed to walk in hospital room?: Total Help needed climbing 3-5 steps with a  railing? : Total 6 Click Score: 10    End of Session Equipment Utilized During Treatment: Gait belt Activity Tolerance: Patient tolerated treatment well Patient left: with call bell/phone within reach;in bed;with bed alarm set Nurse Communication: Mobility status;Need for lift equipment PT Visit Diagnosis: Unsteadiness on feet (R26.81);Other abnormalities of gait and mobility  (R26.89);Muscle weakness (generalized) (M62.81);History of falling (Z91.81);Difficulty in walking, not elsewhere classified (R26.2);Other symptoms and signs involving the nervous system (R29.898)     Time: 6578-4696 PT Time Calculation (min) (ACUTE ONLY): 24 min  Charges:    $Therapeutic Activity: 8-22 mins PT General Charges $$ ACUTE PT VISIT: 1 Visit                     Barnabas Booth, PT, DPT   Acute Rehabilitation Department Office [phone] Secure Chat Communication Preferred   Lona Rist 02/08/2024, 4:18 PM

## 2024-02-08 NOTE — Progress Notes (Signed)
 Trauma/Critical Care Follow Up Note  Subjective:    Overnight Issues:   Objective:  Vital signs for last 24 hours: Temp:  [97.7 F (36.5 C)-100.5 F (38.1 C)] 97.8 F (36.6 C) (06/04 0754) Pulse Rate:  [90-135] 90 (06/04 0700) Resp:  [12-29] 12 (06/04 0700) BP: (83-118)/(55-75) 109/58 (06/04 0700) SpO2:  [93 %-100 %] 100 % (06/04 0700)  Hemodynamic parameters for last 24 hours:    Intake/Output from previous day: 06/03 0701 - 06/04 0700 In: 2039.2 [I.V.:304.2; ZO/XW:9604; IV Piggyback:10] Out: 1075 [Urine:1075]  Intake/Output this shift: No intake/output data recorded.  Vent settings for last 24 hours:    Physical Exam:  Gen: comfortable, no distress Neuro: follows commands, alert, communicative HEENT: PERRL Neck: supple CV: tachycardic Pulm: unlabored breathing on [place] Abd: soft, NT  , no recent BM GU: urine clear and yellow, +spontaneous voids Extr: wwp, no edema  Results for orders placed or performed during the hospital encounter of 02/01/24 (from the past 24 hours)  Glucose, capillary     Status: Abnormal   Collection Time: 02/07/24 11:24 AM  Result Value Ref Range   Glucose-Capillary 123 (H) 70 - 99 mg/dL  Glucose, capillary     Status: Abnormal   Collection Time: 02/07/24  4:07 PM  Result Value Ref Range   Glucose-Capillary 131 (H) 70 - 99 mg/dL  Glucose, capillary     Status: Abnormal   Collection Time: 02/07/24  7:39 PM  Result Value Ref Range   Glucose-Capillary 115 (H) 70 - 99 mg/dL  Basic metabolic panel     Status: Abnormal   Collection Time: 02/07/24  8:41 PM  Result Value Ref Range   Sodium 133 (L) 135 - 145 mmol/L   Potassium 4.5 3.5 - 5.1 mmol/L   Chloride 101 98 - 111 mmol/L   CO2 23 22 - 32 mmol/L   Glucose, Bld 117 (H) 70 - 99 mg/dL   BUN 87 (H) 8 - 23 mg/dL   Creatinine, Ser 5.40 (H) 0.61 - 1.24 mg/dL   Calcium 8.4 (L) 8.9 - 10.3 mg/dL   GFR, Estimated 34 (L) >60 mL/min   Anion gap 9 5 - 15  CBC     Status: Abnormal    Collection Time: 02/07/24  8:41 PM  Result Value Ref Range   WBC 5.7 4.0 - 10.5 K/uL   RBC 2.50 (L) 4.22 - 5.81 MIL/uL   Hemoglobin 7.6 (L) 13.0 - 17.0 g/dL   HCT 98.1 (L) 19.1 - 47.8 %   MCV 93.6 80.0 - 100.0 fL   MCH 30.4 26.0 - 34.0 pg   MCHC 32.5 30.0 - 36.0 g/dL   RDW 29.5 (H) 62.1 - 30.8 %   Platelets 367 150 - 400 K/uL   nRBC 0.0 0.0 - 0.2 %  Magnesium     Status: None   Collection Time: 02/07/24  8:41 PM  Result Value Ref Range   Magnesium 1.9 1.7 - 2.4 mg/dL  TSH     Status: Abnormal   Collection Time: 02/07/24  8:41 PM  Result Value Ref Range   TSH 8.760 (H) 0.350 - 4.500 uIU/mL  Glucose, capillary     Status: Abnormal   Collection Time: 02/07/24 11:39 PM  Result Value Ref Range   Glucose-Capillary 107 (H) 70 - 99 mg/dL  Glucose, capillary     Status: Abnormal   Collection Time: 02/08/24  3:44 AM  Result Value Ref Range   Glucose-Capillary 118 (H) 70 - 99 mg/dL  Phosphorus     Status: Abnormal   Collection Time: 02/08/24  6:27 AM  Result Value Ref Range   Phosphorus 2.2 (L) 2.5 - 4.6 mg/dL  Basic metabolic panel with GFR     Status: Abnormal   Collection Time: 02/08/24  6:27 AM  Result Value Ref Range   Sodium 134 (L) 135 - 145 mmol/L   Potassium 4.3 3.5 - 5.1 mmol/L   Chloride 102 98 - 111 mmol/L   CO2 23 22 - 32 mmol/L   Glucose, Bld 120 (H) 70 - 99 mg/dL   BUN 95 (H) 8 - 23 mg/dL   Creatinine, Ser 0.98 (H) 0.61 - 1.24 mg/dL   Calcium 8.4 (L) 8.9 - 10.3 mg/dL   GFR, Estimated 33 (L) >60 mL/min   Anion gap 9 5 - 15  CBC     Status: Abnormal   Collection Time: 02/08/24  6:27 AM  Result Value Ref Range   WBC 3.9 (L) 4.0 - 10.5 K/uL   RBC 2.35 (L) 4.22 - 5.81 MIL/uL   Hemoglobin 7.1 (L) 13.0 - 17.0 g/dL   HCT 11.9 (L) 14.7 - 82.9 %   MCV 94.5 80.0 - 100.0 fL   MCH 30.2 26.0 - 34.0 pg   MCHC 32.0 30.0 - 36.0 g/dL   RDW 56.2 (H) 13.0 - 86.5 %   Platelets 373 150 - 400 K/uL   nRBC 0.0 0.0 - 0.2 %  Glucose, capillary     Status: Abnormal   Collection  Time: 02/08/24  7:44 AM  Result Value Ref Range   Glucose-Capillary 103 (H) 70 - 99 mg/dL    Assessment & Plan: The plan of care was discussed with the bedside nurse for the day, Brenita Callow, who is in agreement with this plan and no additional concerns were raised.   Present on Admission:  Subdural hematoma (HCC)    LOS: 7 days   Additional comments:I reviewed the patient's new clinical lab test results.   and I reviewed the patients new imaging test results.   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 Rochelle Chu  Loaded with Keppra  in ED, continue BID, F/U CT head slightly increased SDH but exam stable. TBI therapies.  Bilateral rib fractures that were sustained in a fall on 01/21/2024, similar on follow-up CT scan - multimodal pain control, pulm toilet,  Acute hypoxic ventilator dependent respiratory failure - self-extubated, was doing well, weaned to RA, now on O2, yest add flutter, chest PT, duonebs,  EtoH - CIWA. MVI, thiamine , folic acid . Dex prn, but causes hypotension Tachycardia - Cont tele. ST on EKG day before yest, but AF yest, rec's amio bolus x2 and short period of IV amio, looks like sinus on the monitor this morning, will get EKG. Cards consulted and d/c amio. Agree with this. I suspect this is related to active withdrawal. BB PRN, as long as BP will tolerate.  FEN: D2 diet per SLP. Nocturnal TF's via cortrak. FWF VTE: PAS, SQH Endo: TSH elevated, send T3/T4 ID: None Foley: none Dispo: 4NP, TBI therapies. Does not have support for CIR. Likely plan SNF. Continue therapies, wean dex as tolerated  Critical Care Total Time: 35 minutes  Anda Bamberg, MD Trauma & General Surgery Please use [email or web address] to contact on call provider  02/08/2024  *Care during the described time interval was provided by me. I have reviewed this patient's available data, including medical  history, events  of note, physical examination and test results as part of my evaluation.

## 2024-02-09 LAB — CBC
HCT: 22.3 % — ABNORMAL LOW (ref 39.0–52.0)
Hemoglobin: 7.2 g/dL — ABNORMAL LOW (ref 13.0–17.0)
MCH: 30.4 pg (ref 26.0–34.0)
MCHC: 32.3 g/dL (ref 30.0–36.0)
MCV: 94.1 fL (ref 80.0–100.0)
Platelets: 441 10*3/uL — ABNORMAL HIGH (ref 150–400)
RBC: 2.37 MIL/uL — ABNORMAL LOW (ref 4.22–5.81)
RDW: 17 % — ABNORMAL HIGH (ref 11.5–15.5)
WBC: 5.7 10*3/uL (ref 4.0–10.5)
nRBC: 0 % (ref 0.0–0.2)

## 2024-02-09 LAB — BASIC METABOLIC PANEL WITH GFR
Anion gap: 7 (ref 5–15)
BUN: 103 mg/dL — ABNORMAL HIGH (ref 8–23)
CO2: 26 mmol/L (ref 22–32)
Calcium: 9.1 mg/dL (ref 8.9–10.3)
Chloride: 105 mmol/L (ref 98–111)
Creatinine, Ser: 2 mg/dL — ABNORMAL HIGH (ref 0.61–1.24)
GFR, Estimated: 35 mL/min — ABNORMAL LOW (ref >60)
Glucose, Bld: 115 mg/dL — ABNORMAL HIGH (ref 70–99)
Potassium: 4.5 mmol/L (ref 3.5–5.1)
Sodium: 138 mmol/L (ref 135–145)

## 2024-02-09 LAB — GLUCOSE, CAPILLARY
Glucose-Capillary: 101 mg/dL — ABNORMAL HIGH (ref 70–99)
Glucose-Capillary: 101 mg/dL — ABNORMAL HIGH (ref 70–99)
Glucose-Capillary: 104 mg/dL — ABNORMAL HIGH (ref 70–99)
Glucose-Capillary: 108 mg/dL — ABNORMAL HIGH (ref 70–99)
Glucose-Capillary: 109 mg/dL — ABNORMAL HIGH (ref 70–99)
Glucose-Capillary: 115 mg/dL — ABNORMAL HIGH (ref 70–99)
Glucose-Capillary: 92 mg/dL (ref 70–99)

## 2024-02-09 LAB — PHOSPHORUS: Phosphorus: 2.7 mg/dL (ref 2.5–4.6)

## 2024-02-09 MED ORDER — METOPROLOL TARTRATE 25 MG/10 ML ORAL SUSPENSION
6.2500 mg | Freq: Two times a day (BID) | ORAL | Status: DC
  Administered 2024-02-09 (×2): 6.25 mg
  Filled 2024-02-09 (×3): qty 10

## 2024-02-09 MED ORDER — ORAL CARE MOUTH RINSE: 15.0000 mL | OROMUCOSAL | Status: DC | PRN

## 2024-02-09 MED ORDER — ORAL CARE MOUTH RINSE
15.0000 mL | OROMUCOSAL | Status: DC
  Administered 2024-02-09 – 2024-03-06 (×97): 15 mL via OROMUCOSAL

## 2024-02-09 MED ORDER — FREE WATER: 200.0000 mL | Freq: Four times a day (QID) | Status: DC

## 2024-02-09 MED ORDER — FREE WATER
100.0000 mL | Freq: Four times a day (QID) | Status: DC
  Administered 2024-02-09 – 2024-02-11 (×10): 100 mL

## 2024-02-09 NOTE — Progress Notes (Signed)
 Trauma/Critical Care Follow Up Note  Subjective:    Overnight Issues:   Objective:  Vital signs for last 24 hours: Temp:  [96.6 F (35.9 C)-98 F (36.7 C)] 98 F (36.7 C) (06/05 0400) Pulse Rate:  [83-129] 114 (06/05 0605) Resp:  [12-23] 23 (06/05 0605) BP: (76-150)/(53-90) 138/78 (06/05 0605) SpO2:  [89 %-100 %] 98 % (06/05 0605) Weight:  [58 kg] 58 kg (06/05 0500)  Hemodynamic parameters for last 24 hours:    Intake/Output from previous day: 06/04 0701 - 06/05 0700 In: 1633 [I.V.:5.6; NG/GT:1371.2; IV Piggyback:256.2] Out: 1600 [Urine:1600]  Intake/Output this shift: Total I/O In: 978.9 [NG/GT:950; IV Piggyback:28.9] Out: 900 [Urine:900]  Vent settings for last 24 hours:    Physical Exam:  Gen: comfortable, no distress Neuro: sleeping on exam, but f/c and oriented x4 for nurse HEENT: PERRL Neck: supple CV: tachycardic Pulm: unlabored breathing on [place] Abd: soft, NT  , +BM GU: urine clear and yellow, +spontaneous voids Extr: wwp, no edema  Results for orders placed or performed during the hospital encounter of 02/01/24 (from the past 24 hours)  Phosphorus     Status: Abnormal   Collection Time: 02/08/24  6:27 AM  Result Value Ref Range   Phosphorus 2.2 (L) 2.5 - 4.6 mg/dL  Basic metabolic panel with GFR     Status: Abnormal   Collection Time: 02/08/24  6:27 AM  Result Value Ref Range   Sodium 134 (L) 135 - 145 mmol/L   Potassium 4.3 3.5 - 5.1 mmol/L   Chloride 102 98 - 111 mmol/L   CO2 23 22 - 32 mmol/L   Glucose, Bld 120 (H) 70 - 99 mg/dL   BUN 95 (H) 8 - 23 mg/dL   Creatinine, Ser 0.98 (H) 0.61 - 1.24 mg/dL   Calcium 8.4 (L) 8.9 - 10.3 mg/dL   GFR, Estimated 33 (L) >60 mL/min   Anion gap 9 5 - 15  CBC     Status: Abnormal   Collection Time: 02/08/24  6:27 AM  Result Value Ref Range   WBC 3.9 (L) 4.0 - 10.5 K/uL   RBC 2.35 (L) 4.22 - 5.81 MIL/uL   Hemoglobin 7.1 (L) 13.0 - 17.0 g/dL   HCT 11.9 (L) 14.7 - 82.9 %   MCV 94.5 80.0 - 100.0 fL   MCH  30.2 26.0 - 34.0 pg   MCHC 32.0 30.0 - 36.0 g/dL   RDW 56.2 (H) 13.0 - 86.5 %   Platelets 373 150 - 400 K/uL   nRBC 0.0 0.0 - 0.2 %  Glucose, capillary     Status: Abnormal   Collection Time: 02/08/24  7:44 AM  Result Value Ref Range   Glucose-Capillary 103 (H) 70 - 99 mg/dL  Glucose, capillary     Status: None   Collection Time: 02/08/24 11:25 AM  Result Value Ref Range   Glucose-Capillary 93 70 - 99 mg/dL  Glucose, capillary     Status: Abnormal   Collection Time: 02/08/24  3:57 PM  Result Value Ref Range   Glucose-Capillary 107 (H) 70 - 99 mg/dL  T4, free     Status: None   Collection Time: 02/08/24  7:50 PM  Result Value Ref Range   Free T4 0.73 0.61 - 1.12 ng/dL  Glucose, capillary     Status: None   Collection Time: 02/08/24  7:51 PM  Result Value Ref Range   Glucose-Capillary 97 70 - 99 mg/dL  Glucose, capillary     Status: Abnormal  Collection Time: 02/08/24 11:59 PM  Result Value Ref Range   Glucose-Capillary 101 (H) 70 - 99 mg/dL  Glucose, capillary     Status: None   Collection Time: 02/09/24  3:57 AM  Result Value Ref Range   Glucose-Capillary 92 70 - 99 mg/dL  Phosphorus     Status: None   Collection Time: 02/09/24  4:28 AM  Result Value Ref Range   Phosphorus 2.7 2.5 - 4.6 mg/dL  Basic metabolic panel with GFR     Status: Abnormal   Collection Time: 02/09/24  4:28 AM  Result Value Ref Range   Sodium 138 135 - 145 mmol/L   Potassium 4.5 3.5 - 5.1 mmol/L   Chloride 105 98 - 111 mmol/L   CO2 26 22 - 32 mmol/L   Glucose, Bld 115 (H) 70 - 99 mg/dL   BUN 161 (H) 8 - 23 mg/dL   Creatinine, Ser 0.96 (H) 0.61 - 1.24 mg/dL   Calcium 9.1 8.9 - 04.5 mg/dL   GFR, Estimated 35 (L) >60 mL/min   Anion gap 7 5 - 15  CBC     Status: Abnormal   Collection Time: 02/09/24  4:28 AM  Result Value Ref Range   WBC 5.7 4.0 - 10.5 K/uL   RBC 2.37 (L) 4.22 - 5.81 MIL/uL   Hemoglobin 7.2 (L) 13.0 - 17.0 g/dL   HCT 40.9 (L) 81.1 - 91.4 %   MCV 94.1 80.0 - 100.0 fL   MCH  30.4 26.0 - 34.0 pg   MCHC 32.3 30.0 - 36.0 g/dL   RDW 78.2 (H) 95.6 - 21.3 %   Platelets 441 (H) 150 - 400 K/uL   nRBC 0.0 0.0 - 0.2 %    Assessment & Plan: The plan of care was discussed with the bedside nurse for the night, Hadley, who is in agreement with this plan and no additional concerns were raised.   Present on Admission:  Subdural hematoma (HCC)    LOS: 8 days   Additional comments:I reviewed the patient's new clinical lab test results.   and I reviewed the patients new imaging test results.    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 Rochelle Chu  Loaded with Keppra  in ED, continue BID, F/U CT head slightly increased SDH but exam stable. TBI therapies.  Bilateral rib fractures that were sustained in a fall on 01/21/2024, similar on follow-up CT scan - multimodal pain control, pulm toilet,  Acute hypoxic ventilator dependent respiratory failure - self-extubated, was doing well, weaned to RA, now on O2, yest add flutter, chest PT, duonebs EtoH abuse and withdrawal - CIWA. MVI, thiamine , folic acid . No longer requiring dex, improving ativan  req'ts Tachycardia - Cont tele. Alternates between sinus tach and AF, did receive amio bolus and short period of drip to convert on 6/3, went back into sinus tach, appears AF on monitor, BP better, so will schedule some low dose BB. I suspect this is related to active withdrawal. Cont BB PRN, as long as BP will tolerate.  FEN: D2 diet per SLP. Nocturnal TF's via cortrak. FWF VTE: PAS, SQH Endo: TSH elevated, T3 P, T4 normal ID: None Foley: none Dispo: therapies. Does not have support for CIR. Likely plan SNF.  Has remained off dex and only req ativan . Possible txf to 4NP today    Anda Bamberg, MD Trauma & General Surgery Please use [email or web address] to contact on call provider  02/09/2024  *Care during the described time interval was  provided by me. I have reviewed this patient's available data, including medical history, events of note, physical examination and test results as part of my evaluation.

## 2024-02-09 NOTE — Progress Notes (Signed)
 Physical Therapy Treatment Patient Details Name: [name] MRN: [id number] DOB: Feb 07, 1951 Today's Date: 02/09/2024   History of Present Illness 73 yo male presenting to ED 5/28 after being found seizing after falling down stairs. CTH bil SDH and SAH with trace IVH and non-depressed occipital skull fx. Intubated 5/28, self-extubated 5/30. Recently sustained bilateral rib fxs after a fall 5/17. Imaging also showed stable subacute fractures involving the right sacral ala, right superior and inferior rami, and left parasymphyseal region along with stable superior endplate compression deformities at T3, T4, and T5 since 01/21/2024. PMH includes ETOH abuse s/p two liver transplants, 5/17 fall with R sacral ala superior and inferior rami fx, L superior endplate compression deformities at T3 T4 T5    PT Comments  The pt is demonstrating improved initiation with all functional mobility, but is still demonstrating deficits in awareness, safety, and balance that place him at risk for falls. His standing mobility and gait progression was limited this date by his HR elevating each time he stood up, but quickly recovered once sitting again. He was also limited by bowel incontinence upon standing, but he was able to recognize and identify a need to use the restroom urgently. Currently, he is requiring CGA to transition supine to sit, minA to transfer sit to stand, and up to modA to take pivotal steps with a RW. Will continue to follow acutely.     If plan is discharge home, recommend the following: Two people to help with walking and/or transfers;Two people to help with bathing/dressing/bathroom;Assistance with cooking/housework;Assistance with feeding;Direct supervision/assist for medications management;Direct supervision/assist for financial management;Assist for transportation;Help with stairs or ramp for entrance;Supervision due to cognitive status   Can travel by private vehicle        Equipment  Recommendations  Rolling walker (2 wheels);BSC/3in1;Wheelchair (measurements PT);Wheelchair cushion (measurements PT) (pending progress)    Recommendations for Other Services Rehab consult     Precautions / Restrictions Precautions Precautions: Fall;Other (comment) Recall of Precautions/Restrictions: Impaired Precaution/Restrictions Comments: cortrak, rectal pouch, watch HR Restrictions Weight Bearing Restrictions Per Provider Order: No Other Position/Activity Restrictions: Dr. Aniceto Barley confirmed no spinal precautions or weight bearing restrictions 5/31     Mobility  Bed Mobility Overal bed mobility: Needs Assistance Bed Mobility: Supine to Sit     Supine to sit: Contact guard, HOB elevated, Used rails     General bed mobility comments: Extra time but pt demonstrated good initiation to come to sit R EOB using bed features, CGA for safety    Transfers Overall transfer level: Needs assistance Equipment used: Rolling walker (2 wheels) Transfers: Sit to/from Stand, Bed to chair/wheelchair/BSC Sit to Stand: Min assist   Step pivot transfers: Min assist, Mod assist       General transfer comment: VCs provided for hand placement with transfers. Pt able to initiate transfers but needed minA to fully power up to stand from EOB 2x and commode 1x. Min-modA for balance, RW management, and directing pt with step pivot from bed to commode then commode to recliner. Pt tends to maintain a flexed posture.    Ambulation/Gait Ambulation/Gait assistance: Mod assist, Min assist Gait Distance (Feet): 4 Feet (x2 bouts of ~3-4 ft each bout) Assistive device: Rolling walker (2 wheels) Gait Pattern/deviations: Step-to pattern, Decreased step length - right, Decreased step length - left, Decreased stride length, Decreased dorsiflexion - right, Decreased dorsiflexion - left, Trunk flexed, Shuffle Gait velocity: reduced Gait velocity interpretation: <1.31 ft/sec, indicative of household ambulator    General Gait Details: Pt  takes slow, small steps bil, needing VCs to "march" to improve feet clearance and advancement. Pt maintains a flexed posture and needs min-modA for balance and RW management to step pivot between surfaces. Distance limited by HR elevating to 130s with just standing each time.   Stairs             Wheelchair Mobility     Tilt Bed    Modified Rankin (Stroke Patients Only)       Balance Overall balance assessment: Needs assistance Sitting-balance support: No upper extremity supported, Feet supported Sitting balance-Leahy Scale: Fair Sitting balance - Comments: minA to CGA for static sitting   Standing balance support: Bilateral upper extremity supported, During functional activity, Reliant on assistive device for balance Standing balance-Leahy Scale: Poor Standing balance comment: reliant on bil UE support and min-modA                            Communication Communication Communication: Impaired Factors Affecting Communication: Hearing impaired  Cognition Arousal: Alert Behavior During Therapy: Flat affect   PT - Cognitive impairments: Orientation, Awareness, Memory, Attention, Initiation, Sequencing, Problem solving, Safety/Judgement, Rancho level                   Rancho Levels of Cognitive Functioning Rancho Los Amigos Scales of Cognitive Functioning: Confused, Appropriate: Moderate Assistance [email or web address] Scales of Cognitive Functioning: Confused, Appropriate: Moderate Assistance [VI] PT - Cognition Comments: Oriented to hospital and situation. Slow to process cues and does not recall events (BM) of prior day. Needs cues for sequencing tasks and to recognize deficits to improve safety Following commands: Impaired Following commands impaired: Only follows one step commands consistently, Follows one step commands with increased time    Cueing Cueing Techniques: Verbal cues, Tactile cues  Exercises      General  Comments General comments (skin integrity, edema, etc.): HR up to 130s when standing, quickly recovered to 100-110s with sitting      Pertinent Vitals/Pain Pain Assessment Pain Assessment: Faces Faces Pain Scale: Hurts little more Pain Location: generalized Pain Descriptors / Indicators: Grimacing, Discomfort Pain Intervention(s): Limited activity within patient's tolerance, Monitored during session, Repositioned    Home Living                          Prior Function            PT Goals (current goals can now be found in the care plan section) Acute Rehab PT Goals Patient Stated Goal: agreeable to sit up PT Goal Formulation: With patient Time For Goal Achievement: 02/18/24 Potential to Achieve Goals: Good Progress towards PT goals: Progressing toward goals    Frequency    Min 3X/week      PT Plan      Co-evaluation              AM-PAC PT "6 Clicks" Mobility   Outcome Measure  Help needed turning from your back to your side while in a flat bed without using bedrails?: A Little Help needed moving from lying on your back to sitting on the side of a flat bed without using bedrails?: A Little Help needed moving to and from a bed to a chair (including a wheelchair)?: A Lot Help needed standing up from a chair using your arms (e.g., wheelchair or bedside chair)?: A Little Help needed to walk in hospital room?: Total Help needed climbing 3-5 steps  with a railing? : Total 6 Click Score: 13    End of Session Equipment Utilized During Treatment: Gait belt Activity Tolerance: Patient tolerated treatment well Patient left: with call bell/phone within reach;in chair;with chair alarm set Nurse Communication: Mobility status;Other (comment) (HR) PT Visit Diagnosis: Unsteadiness on feet (R26.81);Other abnormalities of gait and mobility (R26.89);Muscle weakness (generalized) (M62.81);History of falling (Z91.81);Difficulty in walking, not elsewhere classified  (R26.2);Other symptoms and signs involving the nervous system (R29.898)     Time: 2130-8657 PT Time Calculation (min) (ACUTE ONLY): 32 min  Charges:    $Therapeutic Activity: 23-37 mins PT General Charges $$ ACUTE PT VISIT: 1 Visit                     Vernida Goodie, PT, DPT Acute Rehabilitation Services  Office: [phone]    Ellyn Hack 02/09/2024, 2:17 PM

## 2024-02-09 NOTE — Plan of Care (Signed)

## 2024-02-10 LAB — GLUCOSE, CAPILLARY
Glucose-Capillary: 105 mg/dL — ABNORMAL HIGH (ref 70–99)
Glucose-Capillary: 117 mg/dL — ABNORMAL HIGH (ref 70–99)
Glucose-Capillary: 117 mg/dL — ABNORMAL HIGH (ref 70–99)
Glucose-Capillary: 116 mg/dL — ABNORMAL HIGH (ref 70–99)
Glucose-Capillary: 93 mg/dL (ref 70–99)
Glucose-Capillary: 89 mg/dL (ref 70–99)

## 2024-02-10 LAB — CBC
HCT: 24.8 % — ABNORMAL LOW (ref 39.0–52.0)
Hemoglobin: 7.7 g/dL — ABNORMAL LOW (ref 13.0–17.0)
MCH: 30.1 pg (ref 26.0–34.0)
MCHC: 31 g/dL (ref 30.0–36.0)
MCV: 96.9 fL (ref 80.0–100.0)
Platelets: 517 10*3/uL — ABNORMAL HIGH (ref 150–400)
RBC: 2.56 MIL/uL — ABNORMAL LOW (ref 4.22–5.81)
RDW: 17.2 % — ABNORMAL HIGH (ref 11.5–15.5)
WBC: 7.1 10*3/uL (ref 4.0–10.5)
nRBC: 0 % (ref 0.0–0.2)

## 2024-02-10 LAB — BASIC METABOLIC PANEL WITH GFR
Anion gap: 9 (ref 5–15)
Anion gap: 12 (ref 5–15)
BUN: 105 mg/dL — ABNORMAL HIGH (ref 8–23)
BUN: 105 mg/dL — ABNORMAL HIGH (ref 8–23)
CO2: 25 mmol/L (ref 22–32)
CO2: 26 mmol/L (ref 22–32)
Calcium: 9.7 mg/dL (ref 8.9–10.3)
Calcium: 9.9 mg/dL (ref 8.9–10.3)
Chloride: 104 mmol/L (ref 98–111)
Chloride: 102 mmol/L (ref 98–111)
Creatinine, Ser: 1.92 mg/dL — ABNORMAL HIGH (ref 0.61–1.24)
Creatinine, Ser: 1.86 mg/dL — ABNORMAL HIGH (ref 0.61–1.24)
GFR, Estimated: 37 mL/min — ABNORMAL LOW (ref >60)
GFR, Estimated: 38 mL/min — ABNORMAL LOW (ref >60)
Glucose, Bld: 118 mg/dL — ABNORMAL HIGH (ref 70–99)
Glucose, Bld: 145 mg/dL — ABNORMAL HIGH (ref 70–99)
Potassium: 5.3 mmol/L — ABNORMAL HIGH (ref 3.5–5.1)
Potassium: 5.7 mmol/L — ABNORMAL HIGH (ref 3.5–5.1)
Sodium: 138 mmol/L (ref 135–145)
Sodium: 140 mmol/L (ref 135–145)

## 2024-02-10 LAB — T3, FREE: T3, Free: 1.1 pg/mL — ABNORMAL LOW (ref 2.0–4.4)

## 2024-02-10 MED ORDER — FUROSEMIDE 10 MG/ML IJ SOLN
20.0000 mg | Freq: Once | INTRAMUSCULAR | Status: AC
  Administered 2024-02-10: 20 mg via INTRAVENOUS
  Filled 2024-02-10: qty 2

## 2024-02-10 MED ORDER — METOPROLOL TARTRATE 25 MG/10 ML ORAL SUSPENSION: 6.2500 mg | Freq: Four times a day (QID) | ORAL | Status: DC

## 2024-02-10 MED ORDER — LEVOTHYROXINE SODIUM 25 MCG PO TABS
25.0000 ug | ORAL_TABLET | Freq: Every day | ORAL | Status: DC
  Administered 2024-02-10 – 2024-02-11 (×2): 25 ug via ORAL
  Filled 2024-02-10 (×2): qty 1

## 2024-02-10 MED ORDER — METOPROLOL TARTRATE 25 MG/10 ML ORAL SUSPENSION
12.5000 mg | Freq: Two times a day (BID) | ORAL | Status: DC
  Administered 2024-02-10 – 2024-02-19 (×19): 12.5 mg
  Filled 2024-02-10 (×20): qty 5

## 2024-02-10 NOTE — Plan of Care (Signed)
 Admitted from ICU to 4NP.  Tube feeds remain.   Problem: Education: Goal: Knowledge of General Education information will improve Description: Including pain rating scale, medication(s)/side effects and non-pharmacologic comfort measures Outcome: Progressing   Problem: Health Behavior/Discharge Planning: Goal: Ability to manage health-related needs will improve Outcome: Progressing   Problem: Clinical Measurements: Goal: Ability to maintain clinical measurements within normal limits will improve Outcome: Progressing Goal: Will remain free from infection Outcome: Progressing

## 2024-02-10 NOTE — Progress Notes (Signed)
 Physical Therapy Treatment Patient Details Name: [name] MRN: [id number] DOB: [birth year] Today's Date: 02/10/2024   History of Present Illness 73 yo male presenting to ED 5/28 after being found seizing after falling down stairs. CTH bil SDH and SAH with trace IVH and non-depressed occipital skull fx. Intubated 5/28, self-extubated 5/30. Recently sustained bilateral rib fxs after a fall 5/17. Imaging also showed stable subacute fractures involving the right sacral ala, right superior and inferior rami, and left parasymphyseal region along with stable superior endplate compression deformities at T3, T4, and T5 since 01/21/2024. PMH includes ETOH abuse s/p two liver transplants, 5/17 fall with R sacral ala superior and inferior rami fx, L superior endplate compression deformities at T3 T4 T5    PT Comments  Pt is progressing towards goals. Currently pt is CGA for supine to sitting, Min A for sitting to supine, Min A for sit to stand and short non-functional distance gait with RW. Pt continues with posterior lean and is limited due to fatigue. Due to pt current functional status, home set up and available assistance at home recommending skilled physical therapy services > 3 hours/day in order to address strength, balance and functional mobility to decrease risk for falls, injury, immobility, skin break down and re-hospitalization.      If plan is discharge home, recommend the following: Assistance with cooking/housework;Assist for transportation;Help with stairs or ramp for entrance;Supervision due to cognitive status;A little help with walking and/or transfers     Equipment Recommendations  Rolling walker (2 wheels);BSC/3in1;Wheelchair (measurements PT)       Precautions / Restrictions Precautions Precautions: Fall;Other (comment) Recall of Precautions/Restrictions: Impaired Precaution/Restrictions Comments: cortrak, watch HR Restrictions Weight Bearing Restrictions Per Provider Order:  No Other Position/Activity Restrictions: Dr. Aniceto Barley confirmed no spinal precautions or weight bearing restrictions 5/31     Mobility  Bed Mobility Overal bed mobility: Needs Assistance Bed Mobility: Supine to Sit, Sit to Supine     Supine to sit: Contact guard, HOB elevated, Used rails Sit to supine: Min assist   General bed mobility comments: Extra time but pt demonstrated good initiation to come to sit R EOB with tactile cues, CGA for safety. Min A to get LE into bed    Transfers Overall transfer level: Needs assistance Equipment used: Rolling walker (2 wheels) Transfers: Sit to/from Stand Sit to Stand: Min assist           General transfer comment: Min A for stability and cues for safe hand placement.    Ambulation/Gait Ambulation/Gait assistance: Min assist Gait Distance (Feet): 20 Feet Assistive device: Rolling walker (2 wheels) Gait Pattern/deviations: Decreased step length - right, Decreased step length - left, Decreased stride length, Decreased dorsiflexion - right, Decreased dorsiflexion - left, Trunk flexed, Shuffle, Step-through pattern Gait velocity: reduced Gait velocity interpretation: <1.31 ft/sec, indicative of household ambulator   General Gait Details: short partial step through to very short step through gait pattern with flat foot initial contact and slightly crouched posture. Difficulty extending knees to fully upright. Pt was limted by fatigue today HR up to 112 bpm     Balance Overall balance assessment: Needs assistance Sitting-balance support: No upper extremity supported, Feet supported Sitting balance-Leahy Scale: Fair Sitting balance - Comments: close SBA for sitting balance EOB   Standing balance support: Bilateral upper extremity supported, During functional activity, Reliant on assistive device for balance Standing balance-Leahy Scale: Poor Standing balance comment: reliant on bil UE support and min A  Communication  Communication Communication: Impaired Factors Affecting Communication: Hearing impaired  Cognition Arousal: Alert Behavior During Therapy: WFL for tasks assessed/performed   PT - Cognitive impairments: Awareness, Memory, Attention, Initiation, Safety/Judgement   Orientation impairments: Person, Place, Time       Rancho Levels of Cognitive Functioning Rancho Los Amigos Scales of Cognitive Functioning: Confused, Appropriate: Moderate Assistance [email or web address] Scales of Cognitive Functioning: Confused, Appropriate: Moderate Assistance [VI] PT - Cognition Comments: confused about situation Following commands: Impaired Following commands impaired: Only follows one step commands consistently, Follows one step commands with increased time    Cueing Cueing Techniques: Verbal cues, Tactile cues     General Comments General comments (skin integrity, edema, etc.): HR 112 during gait. O2 sats WNL throughout session      Pertinent Vitals/Pain Pain Assessment Pain Assessment: Faces Faces Pain Scale: No hurt Facial Expression: Relaxed, neutral Body Movements: Absence of movements Muscle Tension: Relaxed Compliance with ventilator (intubated pts.): N/A Vocalization (extubated pts.): Talking in normal tone or no sound CPOT Total: 0 Pain Intervention(s): Monitored during session     PT Goals (current goals can now be found in the care plan section) Acute Rehab PT Goals Patient Stated Goal: agreeable to sit up PT Goal Formulation: With patient Time For Goal Achievement: 02/18/24 Potential to Achieve Goals: Good Progress towards PT goals: Progressing toward goals    Frequency    Min 3X/week      PT Plan  Continue with current POC        AM-PAC PT "6 Clicks" Mobility   Outcome Measure  Help needed turning from your back to your side while in a flat bed without using bedrails?: A Little Help needed moving from lying on your back to sitting on the side of a flat bed without  using bedrails?: A Little Help needed moving to and from a bed to a chair (including a wheelchair)?: A Little Help needed standing up from a chair using your arms (e.g., wheelchair or bedside chair)?: A Little Help needed to walk in hospital room?: A Lot Help needed climbing 3-5 steps with a railing? : A Lot 6 Click Score: 16    End of Session Equipment Utilized During Treatment: Gait belt Activity Tolerance: Patient tolerated treatment well Patient left: with chair alarm set;in bed;with bed alarm set Nurse Communication: Mobility status PT Visit Diagnosis: Unsteadiness on feet (R26.81);Other abnormalities of gait and mobility (R26.89);Muscle weakness (generalized) (M62.81);History of falling (Z91.81);Difficulty in walking, not elsewhere classified (R26.2);Other symptoms and signs involving the nervous system (R29.898)     Time: 6213-0865 PT Time Calculation (min) (ACUTE ONLY): 23 min  Charges:    $Therapeutic Activity: 23-37 mins PT General Charges $$ ACUTE PT VISIT: 1 Visit                     Sloan Duncans, DPT, CLT  Acute Rehabilitation Services Office: [phone] (Secure chat preferred)    Jenice Mitts 02/10/2024, 4:08 PM

## 2024-02-10 NOTE — Progress Notes (Addendum)
 Trauma/Critical Care Follow Up Note  Subjective:    Overnight Issues: None. Patient states he is eager to leave hospital  Objective:  Vital signs for last 24 hours: Temp:  [97.7 F (36.5 C)-98.5 F (36.9 C)] 97.7 F (36.5 C) (06/06 0809) Pulse Rate:  [105-140] 117 (06/06 0700) Resp:  [13-29] 15 (06/06 0700) BP: (121-151)/(79-102) 136/84 (06/06 0700) SpO2:  [88 %-100 %] 94 % (06/06 0700) Weight:  [59.4 kg] 59.4 kg (06/06 0500)  Hemodynamic parameters for last 24 hours:    Intake/Output from previous day: 06/05 0701 - 06/06 0700 In: 2121.5 [NG/GT:2121.5] Out: 1800 [Urine:1800]  Intake/Output this shift: No intake/output data recorded.  Vent settings for last 24 hours:    Physical Exam:  Gen: comfortable, no distress Neuro: awake, alert, follows commands, oriented HEENT: PERRL Neck: supple CV: tachycardic Pulm: unlabored breathing on [place] Abd: soft, NT  , +BM GU: urine clear and yellow, +spontaneous voids Extr: wwp, no edema  Results for orders placed or performed during the hospital encounter of 02/01/24 (from the past 24 hours)  Glucose, capillary     Status: Abnormal   Collection Time: 02/09/24 11:19 AM  Result Value Ref Range   Glucose-Capillary 115 (H) 70 - 99 mg/dL  Glucose, capillary     Status: Abnormal   Collection Time: 02/09/24  3:29 PM  Result Value Ref Range   Glucose-Capillary 109 (H) 70 - 99 mg/dL  Glucose, capillary     Status: Abnormal   Collection Time: 02/09/24  7:54 PM  Result Value Ref Range   Glucose-Capillary 101 (H) 70 - 99 mg/dL  Glucose, capillary     Status: Abnormal   Collection Time: 02/09/24 11:42 PM  Result Value Ref Range   Glucose-Capillary 108 (H) 70 - 99 mg/dL  Glucose, capillary     Status: Abnormal   Collection Time: 02/10/24  4:00 AM  Result Value Ref Range   Glucose-Capillary 105 (H) 70 - 99 mg/dL  Basic metabolic panel with GFR     Status: Abnormal   Collection Time: 02/10/24  5:19 AM  Result Value Ref Range    Sodium 138 135 - 145 mmol/L   Potassium 5.3 (H) 3.5 - 5.1 mmol/L   Chloride 104 98 - 111 mmol/L   CO2 25 22 - 32 mmol/L   Glucose, Bld 118 (H) 70 - 99 mg/dL   BUN 161 (H) 8 - 23 mg/dL   Creatinine, Ser 0.96 (H) 0.61 - 1.24 mg/dL   Calcium 9.7 8.9 - 04.5 mg/dL   GFR, Estimated 37 (L) >60 mL/min   Anion gap 9 5 - 15  CBC     Status: Abnormal   Collection Time: 02/10/24  5:19 AM  Result Value Ref Range   WBC 7.1 4.0 - 10.5 K/uL   RBC 2.56 (L) 4.22 - 5.81 MIL/uL   Hemoglobin 7.7 (L) 13.0 - 17.0 g/dL   HCT 40.9 (L) 81.1 - 91.4 %   MCV 96.9 80.0 - 100.0 fL   MCH 30.1 26.0 - 34.0 pg   MCHC 31.0 30.0 - 36.0 g/dL   RDW 78.2 (H) 95.6 - 21.3 %   Platelets 517 (H) 150 - 400 K/uL   nRBC 0.0 0.0 - 0.2 %  Glucose, capillary     Status: Abnormal   Collection Time: 02/10/24  7:28 AM  Result Value Ref Range   Glucose-Capillary 117 (H) 70 - 99 mg/dL    Assessment & Plan: The plan of care was discussed with the bedside  nurse, Jenette Mitchell, who is in agreement with this plan and no additional concerns were raised.   Present on Admission:  Subdural hematoma (HCC)    LOS: 9 days   Additional comments:I reviewed the patient's new clinical lab test results.   and I reviewed the patients new imaging test results.    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 Rochelle Chu  Loaded with Keppra  in ED, continue BID, F/U CT head slightly increased SDH but exam stable. TBI therapies.  Bilateral rib fractures that were sustained in a fall on 01/21/2024, similar on follow-up CT scan - multimodal pain control, pulm toilet,  Acute hypoxic ventilator dependent respiratory failure - self-extubated, weaned back to room air, continue flutter, chest PT, duonebs EtoH abuse and withdrawal - CIWA. MVI, thiamine , folic acid . No longer requiring dex, improving ativan  req'ts Tachycardia - Cont tele. Alternates between sinus  tach and AF, did receive amio bolus and short period of drip to convert on 6/3, went back into sinus tach, BP continues to remain stable so will increase metop to 12.5 BID. I suspect this is related to active withdrawal. FEN: D2 diet per SLP. Nocturnal TF's via cortrak. FWF VTE: PAS, SQH Endo: TSH elevated, T3 P, T4 normal ID: None Foley: none Dispo: therapies. Does not have support for CIR. Likely plan SNF.  Has remained off dex and only req ativan . Transfer to 4NP today  Critical Care Time: 30 minutes   Freddrick Jaffe, MD [facility] Surgery Please use [email or web address] to contact on call provider  02/10/2024  *Care during the described time interval was provided by me. I have reviewed this patient's available data, including medical history, events of note, physical examination and test results as part of my evaluation.

## 2024-02-10 NOTE — Progress Notes (Signed)
 Admitted from ICU to 4 NP room 4N05, placed on cardiac monitor and vitals obtained.   MEWS Progress Note  Patient Details Name: [name] MRN: [id number] DOB: Dec 16, 1950 Today's Date: 02/10/2024   MEWS Flowsheet Documentation:  Assess: MEWS Score Temp: 97.9 F (36.6 C) BP: (!) 145/92 MAP (mmHg): 106 Pulse Rate: (!) 113 ECG Heart Rate: (!) 113 Resp: 19 Level of Consciousness: Alert SpO2: 93 % O2 Device: Room Air Patient Activity (if Appropriate): In bed O2 Flow Rate (L/min): 2 L/min FiO2 (%): 28 % Assess: MEWS Score MEWS Temp: 0 MEWS Systolic: 0 MEWS Pulse: 2 MEWS RR: 0 MEWS LOC: 0 MEWS Score: 2 MEWS Score Color: Yellow Assess: SIRS CRITERIA SIRS Temperature : 0 SIRS Respirations : 0 SIRS Pulse: 1 SIRS WBC: 0 SIRS Score Sum : 1 SIRS Temperature : 0 SIRS Pulse: 1 SIRS Respirations : 0 SIRS WBC: 0 SIRS Score Sum : 1 Assess: if the MEWS score is Yellow or Red Were vital signs accurate and taken at a resting state?: Yes Does the patient meet 2 or more of the SIRS criteria?: No MEWS guidelines implemented : Yes, yellow Treat MEWS Interventions: Considered administering scheduled or prn medications/treatments as ordered Take Vital Signs Increase Vital Sign Frequency : Yellow: Q2hr x1, continue Q4hrs until patient remains green for 12hrs Escalate MEWS: Escalate: Yellow: Discuss with charge nurse and consider notifying provider and/or RRT Notify: Charge Nurse/RN Name of Charge Nurse/RN Notified: Electa Grieve RN / Sim Dryer RN      Delmus Ferri 02/10/2024, 6:08 PM

## 2024-02-11 LAB — CBC
HCT: 26 % — ABNORMAL LOW (ref 39.0–52.0)
Hemoglobin: 8 g/dL — ABNORMAL LOW (ref 13.0–17.0)
MCH: 30 pg (ref 26.0–34.0)
MCHC: 30.8 g/dL (ref 30.0–36.0)
MCV: 97.4 fL (ref 80.0–100.0)
Platelets: 575 10*3/uL — ABNORMAL HIGH (ref 150–400)
RBC: 2.67 MIL/uL — ABNORMAL LOW (ref 4.22–5.81)
RDW: 17.2 % — ABNORMAL HIGH (ref 11.5–15.5)
WBC: 10.2 10*3/uL (ref 4.0–10.5)
nRBC: 0 % (ref 0.0–0.2)

## 2024-02-11 LAB — GLUCOSE, CAPILLARY
Glucose-Capillary: 112 mg/dL — ABNORMAL HIGH (ref 70–99)
Glucose-Capillary: 126 mg/dL — ABNORMAL HIGH (ref 70–99)
Glucose-Capillary: 123 mg/dL — ABNORMAL HIGH (ref 70–99)
Glucose-Capillary: 54 mg/dL — ABNORMAL LOW (ref 70–99)
Glucose-Capillary: 137 mg/dL — ABNORMAL HIGH (ref 70–99)
Glucose-Capillary: 250 mg/dL — ABNORMAL HIGH (ref 70–99)
Glucose-Capillary: 102 mg/dL — ABNORMAL HIGH (ref 70–99)
Glucose-Capillary: 95 mg/dL (ref 70–99)

## 2024-02-11 LAB — BASIC METABOLIC PANEL WITH GFR
Anion gap: 7 (ref 5–15)
BUN: 106 mg/dL — ABNORMAL HIGH (ref 8–23)
CO2: 27 mmol/L (ref 22–32)
Calcium: 10.3 mg/dL (ref 8.9–10.3)
Chloride: 104 mmol/L (ref 98–111)
Creatinine, Ser: 1.89 mg/dL — ABNORMAL HIGH (ref 0.61–1.24)
GFR, Estimated: 38 mL/min — ABNORMAL LOW (ref >60)
Glucose, Bld: 117 mg/dL — ABNORMAL HIGH (ref 70–99)
Potassium: 6.1 mmol/L — ABNORMAL HIGH (ref 3.5–5.1)
Sodium: 138 mmol/L (ref 135–145)

## 2024-02-11 LAB — POTASSIUM
Potassium: 6.2 mmol/L — ABNORMAL HIGH (ref 3.5–5.1)
Potassium: 6.3 mmol/L (ref 3.5–5.1)
Potassium: 5.6 mmol/L — ABNORMAL HIGH (ref 3.5–5.1)

## 2024-02-11 MED ORDER — DEXTROSE 50 % IV SOLN
12.5000 g | INTRAVENOUS | Status: AC
  Administered 2024-02-11: 12.5 g via INTRAVENOUS
  Filled 2024-02-11: qty 50

## 2024-02-11 MED ORDER — FREE WATER
150.0000 mL | Freq: Four times a day (QID) | Status: DC
  Administered 2024-02-11 – 2024-02-13 (×4): 150 mL

## 2024-02-11 MED ORDER — DEXTROSE 50 % IV SOLN
1.0000 | Freq: Once | INTRAVENOUS | Status: AC
  Administered 2024-02-11: 50 mL via INTRAVENOUS
  Filled 2024-02-11: qty 50

## 2024-02-11 MED ORDER — INSULIN ASPART 100 UNIT/ML IV SOLN
10.0000 [IU] | Freq: Once | INTRAVENOUS | Status: AC
  Administered 2024-02-11: 10 [IU] via INTRAVENOUS

## 2024-02-11 MED ORDER — LEVOTHYROXINE SODIUM 25 MCG PO TABS
25.0000 ug | ORAL_TABLET | Freq: Every day | ORAL | Status: DC
  Administered 2024-02-13 – 2024-02-19 (×7): 25 ug
  Filled 2024-02-11 (×7): qty 1

## 2024-02-11 MED ORDER — THIAMINE MONONITRATE 100 MG PO TABS
100.0000 mg | ORAL_TABLET | Freq: Every day | ORAL | Status: DC
  Administered 2024-02-11 – 2024-02-19 (×9): 100 mg
  Filled 2024-02-11 (×9): qty 1

## 2024-02-11 MED ORDER — ALBUTEROL SULFATE (2.5 MG/3ML) 0.083% IN NEBU
10.0000 mg | INHALATION_SOLUTION | Freq: Once | RESPIRATORY_TRACT | Status: AC
  Administered 2024-02-11: 10 mg via RESPIRATORY_TRACT
  Filled 2024-02-11: qty 12

## 2024-02-11 MED ORDER — SODIUM CHLORIDE 0.9 % IV BOLUS
500.0000 mL | Freq: Once | INTRAVENOUS | Status: AC
  Administered 2024-02-11: 500 mL via INTRAVENOUS

## 2024-02-11 MED ORDER — SODIUM ZIRCONIUM CYCLOSILICATE 10 G PO PACK
10.0000 g | PACK | Freq: Two times a day (BID) | ORAL | Status: DC
  Filled 2024-02-11: qty 1

## 2024-02-11 MED ORDER — FOLIC ACID 1 MG PO TABS
1.0000 mg | ORAL_TABLET | Freq: Every day | ORAL | Status: DC
  Administered 2024-02-11 – 2024-02-19 (×9): 1 mg
  Filled 2024-02-11 (×9): qty 1

## 2024-02-11 MED ORDER — DOCUSATE SODIUM 50 MG/5ML PO LIQD
100.0000 mg | Freq: Two times a day (BID) | ORAL | Status: DC
  Administered 2024-02-11 – 2024-02-19 (×15): 100 mg
  Filled 2024-02-11 (×16): qty 10

## 2024-02-11 MED ORDER — SODIUM ZIRCONIUM CYCLOSILICATE 10 G PO PACK
10.0000 g | PACK | Freq: Two times a day (BID) | ORAL | Status: AC
  Administered 2024-02-11 (×2): 10 g
  Filled 2024-02-11 (×3): qty 1

## 2024-02-11 MED ORDER — INSULIN ASPART 100 UNIT/ML IV SOLN
5.0000 [IU] | Freq: Once | INTRAVENOUS | Status: AC
  Administered 2024-02-11: 5 [IU] via INTRAVENOUS

## 2024-02-11 MED ORDER — POLYETHYLENE GLYCOL 3350 17 G PO PACK
17.0000 g | PACK | Freq: Every day | ORAL | Status: DC
  Administered 2024-02-12 – 2024-02-18 (×4): 17 g
  Filled 2024-02-11 (×5): qty 1

## 2024-02-11 MED ORDER — SODIUM ZIRCONIUM CYCLOSILICATE 10 G PO PACK: 10.0000 g | PACK | Freq: Two times a day (BID) | ORAL | Status: DC

## 2024-02-11 MED ORDER — OSMOLITE 1.5 CAL PO LIQD
1000.0000 mL | ORAL | Status: DC
  Administered 2024-02-11: 1000 mL

## 2024-02-11 MED ORDER — ADULT MULTIVITAMIN W/MINERALS CH
1.0000 | ORAL_TABLET | Freq: Every day | ORAL | Status: DC
  Administered 2024-02-11 – 2024-02-19 (×9): 1
  Filled 2024-02-11 (×9): qty 1

## 2024-02-11 MED ORDER — PROSOURCE TF20 ENFIT COMPATIBL EN LIQD
60.0000 mL | Freq: Every day | ENTERAL | Status: DC
  Administered 2024-02-11: 60 mL
  Filled 2024-02-11: qty 60

## 2024-02-11 MED ORDER — POLYETHYLENE GLYCOL 3350 17 G PO PACK
17.0000 g | PACK | Freq: Every day | ORAL | Status: DC | PRN
Start: 2024-02-11 — End: 2024-02-12

## 2024-02-11 NOTE — Progress Notes (Signed)
 Trauma/Critical Care Follow Up Note  Subjective:    Overnight Issues: None. Patient is oriented to person, [facility], 2025, fall down steps. Reports BM yesterday. States eating is going ok but has not touched his breakfast.  Objective:  Vital signs for last 24 hours: Temp:  [97.4 F (36.3 C)-98.2 F (36.8 C)] 98.2 F (36.8 C) (06/07 0746) Pulse Rate:  [106-121] 112 (06/07 0746) Resp:  [14-19] 17 (06/07 0746) BP: (114-155)/(83-95) 114/84 (06/07 0746) SpO2:  [93 %-96 %] 93 % (06/07 0746)  Hemodynamic parameters for last 24 hours:    Intake/Output from previous day: 06/06 0701 - 06/07 0700 In: 917.3 [P.O.:300; NG/GT:617.3] Out: 2150 [Urine:2150]  Intake/Output this shift: No intake/output data recorded.  Vent settings for last 24 hours:    Physical Exam:  Gen: comfortable, no distress Neuro: awake, alert, follows commands, oriented HEENT: PERRL Neck: supple CV: tachycardic Pulm: unlabored breathing on [place] Abd: soft, NT  , +BM TF @ 55 mL/hr GU: urine clear and yellow, +spontaneous voids Extr: wwp, no edema  Results for orders placed or performed during the hospital encounter of 02/01/24 (from the past 24 hours)  Glucose, capillary     Status: Abnormal   Collection Time: 02/10/24 11:22 AM  Result Value Ref Range   Glucose-Capillary 117 (H) 70 - 99 mg/dL  Basic metabolic panel     Status: Abnormal   Collection Time: 02/10/24  1:36 PM  Result Value Ref Range   Sodium 140 135 - 145 mmol/L   Potassium 5.7 (H) 3.5 - 5.1 mmol/L   Chloride 102 98 - 111 mmol/L   CO2 26 22 - 32 mmol/L   Glucose, Bld 145 (H) 70 - 99 mg/dL   BUN 540 (H) 8 - 23 mg/dL   Creatinine, Ser 9.81 (H) 0.61 - 1.24 mg/dL   Calcium 9.9 8.9 - 19.1 mg/dL   GFR, Estimated 38 (L) >60 mL/min   Anion gap 12 5 - 15  Glucose, capillary     Status: Abnormal   Collection Time: 02/10/24  3:37 PM  Result Value Ref Range   Glucose-Capillary 116 (H) 70 - 99 mg/dL  Glucose, capillary     Status: None   Collection  Time: 02/10/24  7:50 PM  Result Value Ref Range   Glucose-Capillary 93 70 - 99 mg/dL  Glucose, capillary     Status: None   Collection Time: 02/10/24 11:23 PM  Result Value Ref Range   Glucose-Capillary 89 70 - 99 mg/dL  Glucose, capillary     Status: Abnormal   Collection Time: 02/11/24  3:33 AM  Result Value Ref Range   Glucose-Capillary 112 (H) 70 - 99 mg/dL  Basic metabolic panel with GFR     Status: Abnormal   Collection Time: 02/11/24  3:45 AM  Result Value Ref Range   Sodium 138 135 - 145 mmol/L   Potassium 6.1 (H) 3.5 - 5.1 mmol/L   Chloride 104 98 - 111 mmol/L   CO2 27 22 - 32 mmol/L   Glucose, Bld 117 (H) 70 - 99 mg/dL   BUN 478 (H) 8 - 23 mg/dL   Creatinine, Ser 2.95 (H) 0.61 - 1.24 mg/dL   Calcium 62.1 8.9 - 30.8 mg/dL   GFR, Estimated 38 (L) >60 mL/min   Anion gap 7 5 - 15  CBC     Status: Abnormal   Collection Time: 02/11/24  3:45 AM  Result Value Ref Range   WBC 10.2 4.0 - 10.5 K/uL   RBC  2.67 (L) 4.22 - 5.81 MIL/uL   Hemoglobin 8.0 (L) 13.0 - 17.0 g/dL   HCT 82.9 (L) 56.2 - 13.0 %   MCV 97.4 80.0 - 100.0 fL   MCH 30.0 26.0 - 34.0 pg   MCHC 30.8 30.0 - 36.0 g/dL   RDW 86.5 (H) 78.4 - 69.6 %   Platelets 575 (H) 150 - 400 K/uL   nRBC 0.0 0.0 - 0.2 %  Glucose, capillary     Status: Abnormal   Collection Time: 02/11/24  7:42 AM  Result Value Ref Range   Glucose-Capillary 126 (H) 70 - 99 mg/dL    Assessment & Plan: The plan of care was discussed with the bedside nurse, Jenette Mitchell, who is in agreement with this plan and no additional concerns were raised.   Present on Admission:  Subdural hematoma (HCC)    LOS: 10 days   Additional comments:I reviewed the patient's new clinical lab test results.   and I reviewed the patients new imaging test results.    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 Rochelle Chu  Loaded with Keppra  in ED, continue BID, F/U CT  head slightly increased SDH but exam stable. TBI therapies.  Bilateral rib fractures that were sustained in a fall on 01/21/2024, similar on follow-up CT scan - multimodal pain control, pulm toilet,  Acute hypoxic ventilator dependent respiratory failure - self-extubated, weaned back to room air, continue flutter, chest PT, duonebs EtoH abuse and withdrawal - CIWA. MVI, thiamine , folic acid . No longer requiring dex, improving ativan  req'ts Tachycardia - Cont tele. Alternates between sinus tach and AF, did receive amio bolus and short period of drip to convert on 6/3, went back into sinus tach, BP continues to remain stable so will increased metop to 12.5 BID on 6/6. I suspect this is related to active withdrawal. FEN: D2 diet per SLP. Currently on continuous TF's via cortrak - switch to nocturnal. FWF; hyperkalemia 6.1 - EKG sinus tach; give lokelma  10 mg 0800, 1800, recheck in AM VTE: PAS, SQH Endo: TSH elevated, T3 P, T4 normal ID: None Foley: none Dispo: therapies. Does not have support for CIR. Likely plan SNF.  4NP; hopefully D/C TF soon, monitor K, continue CIWA  Michial Akin, [facility] Please see Amion for pager number during day hours 7:00am-4:30pm       02/11/2024  *Care during the described time interval was provided by me. I have reviewed this patient's available data, including medical history, events of note, physical examination and test results as part of my evaluation.

## 2024-02-11 NOTE — Progress Notes (Signed)
 Hypoglycemic Event  CBG: 54 at 1507   Treatment: D50 25 mL (12.5 gm)  Symptoms: None  Follow-up CBG: Time:1541 CBG Result: 137  Possible Reasons for Event: Inadequate meal intake  Comments/MD notified: Dr. Leighton Punches notified via secure Chat Message and page sent.

## 2024-02-11 NOTE — Progress Notes (Signed)
 Received consult at 1500, arrived to room at 1515. RN at bedside and placed successful PIV. Found PICC in trash and verified removal length of 43cm. Dressing intact over PICC insertion site. Educated on when to remove dressing (24 hours from now on 6/8). Encouraged to re consult IV team with any other concerns.

## 2024-02-11 NOTE — Progress Notes (Signed)
 Nutrition Follow-up  DOCUMENTATION CODES:  Severe malnutrition in context of chronic illness  INTERVENTION:  Continue current diet as ordered per SLP Encourage PO intake Adjust to nocturnal feedings via Cortrak tube: Osmolite 1.5 @ 83 ml/hr x 12 hours (1L/d) Prosource TF20 1x/d 150 mL of free water  q4h This provides 1574kcal, 82g protein, and free water  (TF+flush=1662mL free water ) Continue: 100 mg thiamine , MVI with minerals, and 1 mg folic acid  daily Daily weights Discontinue ensure, pt refusing all. Add Mighty Shake TID with meals, each supplement provides 330 kcals and 9 grams of protein If pt not able to increase his PO intake, may benefit from PMT care consult to determine GOC in terms of longterm nutrition   NUTRITION DIAGNOSIS:  Severe Malnutrition related to chronic illness as evidenced by severe fat depletion, severe muscle depletion. Ongoing  GOAL:  Patient will meet greater than or equal to 90% of their needs Met with TF  MONITOR:  TF tolerance  REASON FOR ASSESSMENT:  Consult Enteral/tube feeding initiation and management (nocturnal TF)  ASSESSMENT:   Pt with PMH of ETOH abuse s/p 2 liver transplants admitted after fall, found at the bottom of the stairs, admitted with TBI/bilateral SDH, anterior SAH, skull fx, and bilateral rib fxs (from 5/17 fall).  5/30- cortrak placed (gastric), s/p MBSS- dysphagia 2 diet with nectar thick liquids 5/31- s/p BSE- dysphagia 2 diet with nectar thick liquids, transitioned to nocturnal feeds 6/4 - transitioned back to continuous feeds as pt with very poor PO intake  RD working remotely.   New consult received to adjust pt to nocturnal TF. Noted that this was attempted last week without any improvement in PO intake. Pt does not seem to be interested in food and it has been reported that PO intake is poor at home as well. If no improvement in PO intake, it may be beneficial to have GOC discussion with pt to determine his  wishes in regards to longterm feeds as cortrak is not a permanent solution to his poor appetite.   If nutrition is the only barrier to discharge, this chronic problem is unlikely to be resolved during this hospitalization. Would recommend determining if pt is taking in enough fluid to avoid a readmission for dehydration prior to removing tube.  Pt refusing all ensures. Will remove and try magic cup.   Nocturnal TF will meet ~80-85% of needs  Admit weight: 62 kg  Current weight: 59.4 kg    Average Meal Intake: 6/2-6/6: 8% intake x 3 recorded meals  Nutritionally Relevant Medications: Scheduled Meds:  docusate  100 mg Per Tube BID   Ensure Plus High Protein  237 mL Oral TID BM   (PROSource TF20)  60 mL Per Tube BID   folic acid   1 mg Per Tube Daily   free water   100 mL Per Tube Q6H   multivitamin with minerals  1 tablet Per Tube Daily   polyethylene glycol  17 g Per Tube Daily   sodium zirconium cyclosilicate   10 g Per Tube BID   tacrolimus   0.5 mg Sublingual BID   thiamine   100 mg Per Tube Daily   Continuous Infusions:  feeding supplement (OSMOLITE 1.5 CAL) 55 mL/hr at 02/11/24 1410   PRN Meds: ondansetron , polyethylene glycol  Labs Reviewed: Potassium 6.1 BUN 106, creatinine 1.89 CBG ranges from 89-126 mg/dL over the last 24 hours  Diet Order:   Diet Order             DIET DYS 2  Room service appropriate? Yes with Assist; Fluid consistency: Nectar Thick  Diet effective now                   EDUCATION NEEDS:  Not appropriate for education at this time  Skin:  Skin Assessment: Skin Integrity Issues: Skin Integrity Issues:: Other (Comment) Other: head laceration, skin tear to lt lower arm  Last BM:  6/6 - type 6  Height:  Ht Readings from Last 1 Encounters:  02/01/24 5\' 8"  (1.727 m)    Weight:  Wt Readings from Last 1 Encounters:  02/10/24 59.4 kg   BMI:  Body mass index is 19.91 kg/m.  Estimated Nutritional Needs:  Kcal:  1800-2000 Protein:   95-115 grams Fluid:  > 1.8 L/day    Edwena Graham, RD, LDN Registered Dietitian II Please reach out via secure chat

## 2024-02-11 NOTE — Progress Notes (Signed)
 Called to room by Nursing Assistant/ NT that patient had self removed PICC line.  Catheter tip intact.  Transparent dressing and gauze applied at insertion site.  Patient stated " I was itching" when asked why he removed PICC line, telemetry leads, and SCD sleeves.

## 2024-02-12 ENCOUNTER — Encounter (HOSPITAL_COMMUNITY): Payer: Self-pay | Admitting: General Surgery

## 2024-02-12 LAB — CBC
HCT: 24.8 % — ABNORMAL LOW (ref 39.0–52.0)
Hemoglobin: 7.7 g/dL — ABNORMAL LOW (ref 13.0–17.0)
MCH: 29.7 pg (ref 26.0–34.0)
MCHC: 31 g/dL (ref 30.0–36.0)
MCV: 95.8 fL (ref 80.0–100.0)
Platelets: 535 10*3/uL — ABNORMAL HIGH (ref 150–400)
RBC: 2.59 MIL/uL — ABNORMAL LOW (ref 4.22–5.81)
RDW: 17 % — ABNORMAL HIGH (ref 11.5–15.5)
WBC: 8.4 10*3/uL (ref 4.0–10.5)
nRBC: 0 % (ref 0.0–0.2)

## 2024-02-12 LAB — BASIC METABOLIC PANEL WITH GFR
Anion gap: 9 (ref 5–15)
BUN: 101 mg/dL — ABNORMAL HIGH (ref 8–23)
CO2: 26 mmol/L (ref 22–32)
Calcium: 10.3 mg/dL (ref 8.9–10.3)
Chloride: 102 mmol/L (ref 98–111)
Creatinine, Ser: 2 mg/dL — ABNORMAL HIGH (ref 0.61–1.24)
GFR, Estimated: 35 mL/min — ABNORMAL LOW (ref >60)
Glucose, Bld: 107 mg/dL — ABNORMAL HIGH (ref 70–99)
Potassium: 5.8 mmol/L — ABNORMAL HIGH (ref 3.5–5.1)
Sodium: 137 mmol/L (ref 135–145)

## 2024-02-12 LAB — POTASSIUM
Potassium: 5.6 mmol/L — ABNORMAL HIGH (ref 3.5–5.1)
Potassium: 6 mmol/L — ABNORMAL HIGH (ref 3.5–5.1)
Potassium: 5.8 mmol/L — ABNORMAL HIGH (ref 3.5–5.1)
Potassium: 5.6 mmol/L — ABNORMAL HIGH (ref 3.5–5.1)

## 2024-02-12 LAB — GLUCOSE, CAPILLARY
Glucose-Capillary: 111 mg/dL — ABNORMAL HIGH (ref 70–99)
Glucose-Capillary: 107 mg/dL — ABNORMAL HIGH (ref 70–99)
Glucose-Capillary: 103 mg/dL — ABNORMAL HIGH (ref 70–99)
Glucose-Capillary: 115 mg/dL — ABNORMAL HIGH (ref 70–99)
Glucose-Capillary: 96 mg/dL (ref 70–99)
Glucose-Capillary: 84 mg/dL (ref 70–99)

## 2024-02-12 MED ORDER — LORAZEPAM 1 MG PO TABS: 1.0000 mg | ORAL_TABLET | ORAL | Status: AC | PRN

## 2024-02-12 MED ORDER — DEXTROSE-SODIUM CHLORIDE 5-0.45 % IV SOLN: INTRAVENOUS | Status: AC

## 2024-02-12 MED ORDER — SODIUM ZIRCONIUM CYCLOSILICATE 10 G PO PACK
10.0000 g | PACK | Freq: Three times a day (TID) | ORAL | Status: AC
  Administered 2024-02-12 (×3): 10 g via ORAL
  Filled 2024-02-12 (×3): qty 1

## 2024-02-12 MED ORDER — LORAZEPAM 2 MG/ML IJ SOLN: 1.0000 mg | INTRAMUSCULAR | Status: AC | PRN

## 2024-02-12 NOTE — Progress Notes (Signed)
   02/12/24 0753  Provider Notification  Provider Name/Title Simaan PA  Date Provider Notified 02/12/24  Time Provider Notified [phone]  Method of Notification Rounds;Face-to-face  Notification Reason Change in status;Other (Comment)  Provider response In department;See new orders   Patient complaining of 7/10 aching chest pain. No SOB, numbness, tingling. History of aFib with RVR. Simaan PA notified during department rounds. Verbal order for STAT 12 lead EKG. This RN reviewed EKG result with Simaan PA and placed in chart.

## 2024-02-12 NOTE — Consult Note (Addendum)
 Renal Service Consult Note [facility]  [name] 02/12/2024 Lynae Sandifer, MD Requesting Physician: Dr. Desiderio Floras  Reason for Consult: Renal failure  HPI: The patient is a 73 y.o. year-old w/ PMH as below who presented after a fall w/ rib fractures, was found by family at the bottom of the stairs after another suspected fall. Pt was seizing. Pt intubated on arrival. Found to have SAH, skull fracture, bilat SDH's, TBI, bilat rib fractures. He suffered etoh withdrawal. He had acute hypoxic resp failure and was on the vent. His renal function has improved from admission, but now the BUN is going up to 100. We are asked to see for renal failure.   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 He did have some kidney problems post transplant and took HD for a while but is not getting it anymore. Pt had been drinking alcohol  again at home.    Pt seen in room. Pt states he is going back to [facility] sometime soon and doesn't need a kidney doctor here. He denies any SOB, cough, leg swelling. Voiding is normal.    ROS - denies CP, no joint pain, no HA, no blurry vision, no rash, no diarrhea, no nausea/ vomiting  PMH: Liver transplant   Past Surgical History  Past Surgical History:  Procedure Laterality Date   LIVER TRANSPLANT     Family History History reviewed. No pertinent family history. Social History  has no history on file for tobacco use, alcohol  use, and drug use. Allergies  Allergies  Allergen Reactions   Tape Other (See Comments)    Skin tears    Zolpidem Other (See Comments)    confusion   Home medications Prior to Admission medications   Medication Sig Start Date End Date Taking? Authorizing Provider  buPROPion (WELLBUTRIN SR) 150 MG 12 hr tablet Take 150 mg by mouth 2 (two) times daily. 12/16/23   [provider]  famotidine (PEPCID) 40 MG tablet Take 40 mg by mouth at bedtime.  12/16/23   [provider]  folic acid  (FOLVITE ) 1 MG tablet Take 1 mg by mouth daily. 01/24/24   [provider]  magnesium oxide (MAG-OX) 400 (240 Mg) MG tablet Take 1 tablet by mouth 2 (two) times daily. 12/15/23   [provider]  methocarbamol  (ROBAXIN ) 500 MG tablet Take 500 mg by mouth every 8 (eight) hours as needed. 01/24/24   [provider]  ondansetron  (ZOFRAN ) 4 MG tablet Take 4 mg by mouth every 6 (six) hours as needed. 01/24/24   [provider]  oxyCODONE  (OXY IR/ROXICODONE ) 5 MG immediate release tablet Take 5 mg by mouth every 6 (six) hours as needed for moderate pain (pain score 4-6) or severe pain (pain score 7-10). 01/24/24   [provider]  STIMULANT LAXATIVE 8.6-50 MG tablet Take 1 tablet by mouth at bedtime. 01/24/24   [provider]  tacrolimus  (PROGRAF ) 1 MG capsule Take 1 mg by mouth every 12 (twelve) hours. 12/12/23   [provider]  tamsulosin (FLOMAX) 0.4 MG CAPS capsule Take 0.4 mg by mouth daily after supper. 12/16/23   [provider]  traMADol (ULTRAM) 50 MG tablet Take 50 mg by mouth 2 (two) times daily as needed. 12/23/23   [provider]  traZODone (DESYREL) 100 MG tablet Take 100 mg by mouth at bedtime. 08/09/23   [provider]     Vitals:  02/12/24 0744 02/12/24 0751 02/12/24 1154 02/12/24 1543  BP: 128/87  119/77 124/79  Pulse: (!) 112 (!) 104 (!) 108   Resp: 16 14 15 18   Temp: (!) 97.5 F (36.4 C)  98.4 F (36.9 C) 98.7 F (37.1 C)  TempSrc: Oral  Oral Oral  SpO2: 96% 93% 98% 95%  Weight:      Height:       Exam Gen awake, no distress, frail elderly male No rash, cyanosis or gangrene Sclera anicteric, throat clear  No jvd or bruits Chest clear bilat to bases, no rales/ wheezing RRR no MRG Abd soft ntnd no mass or ascites +bs GU deferred MS no joint effusions or deformity Ext no LE or UE edema, no other edema Neuro is alert, Ox 3 , nf      Renal-related home meds: Tacrolimus  1mg  bid Others: ultram, trazodone, flomax, oxyIR, zofran , robaxin , pepcide, wellbutrin sr  Date   Creat  eGFR (ml/min) 5/28   3.40  21   5/29   2.79  24   5/30   2.57  26 5/31   2.87  23 6/01   2.39  28 6/02   2.00  35 6/03   2.08  34 6/04   2.10  33 6/05   2.00  35   6/06   1.92  37 6/07   1.89  38 6/08   2.00  35  CT C/A/P +contrast 75 ml on 02/01/24 -> IMPRESSION: Urinary Tract: No evidence for hydroureter. The urinary bladder appears normal for the degree of distention  CXR 5/28, 5/29, 6/02 --> broken ribs, +/- ventilator, no edema or infiltrates  BP's stable most of hospital stay since 5/28, some soft BP's in the 90's-100s but mostly SBP 115- 140/ 65-80 BP's dropped to 80's on 6/03 mid-day for several hours No pressors used this admission  I/O total since admit = 18 L in and 17 L out = 1 L+ Wt's on admit 62kg, today 60.7kg, not much difference   Today --> Na 137  K 5.8  CO2 26  BUN 101 creatinine 2.0 eGFR 35 ml/min   High K+'s started on 6/06  Ensure Plus High Protein is high in K+ at 470mg  per container     Assessment/ Plan: CKD 3b: b/l creatinine appears to be 1.8- 2.1 from this admission, eGFR 33- 38 ml/min. No other data are available. On admission creat was 3.4 and has now leveled off down around the low 2's. He may be dehydrated, will ^IVF's for 1-2 days to see if this helps flush excess K+ out. For CKD he declines to see local nephrologist, says he is "going back to [facility]".  Hyperkalemia: He is drinking off and on the protein shakes by Ensure, which have 470mg  K+ per shake. "Nepro" products are necessary for people w/ advanced CKD to control the potassium level, they typically have 1/2 the amount of K+. I have dc'd the Osmolite tube feeds, and the Ensure Plus High Protein shakes have already been dc'd. Would recommend dietary assistance before resuming any supplements or tube feeds, to be sure they are low K+ products. Continue  Lokelma  10 gm tid until K+ < 5.  Liver transplant: on prograf  Fall/ rib fractures/ debility: per primary team.     Larry Poag  MD [facility] 02/12/2024, 6:47 PM  Recent Labs  Lab 02/06/24 1102 02/06/24 1526 02/08/24 1610 02/09/24 9604 02/10/24 5409 02/11/24 0345 02/11/24 1627 02/12/24 0232 02/12/24 1013 02/12/24 1307  HGB 10.0*   < >  7.1* 7.2*   < > 8.0*  --  7.7*  --   --   ALBUMIN 2.3*  --   --   --   --   --   --   --   --   --   CALCIUM 8.6*   < > 8.4* 9.1   < > 10.3  --  10.3  --   --   PHOS  --    < > 2.2* 2.7  --   --   --   --   --   --   CREATININE 2.00*   < > 2.10* 2.00*   < > 1.89*  --  2.00*  --   --   K 5.0   < > 4.3 4.5   < > 6.1*   < > 5.8* 5.6* 6.0*   < > = values in this interval not displayed.   Inpatient medications:  acetaminophen   1,000 mg Per Tube Q6H   docusate  100 mg Per Tube BID   feeding supplement (OSMOLITE 1.5 CAL)  1,000 mL Per Tube Q24H   feeding supplement (PROSource TF20)  60 mL Per Tube Daily   folic acid   1 mg Per Tube Daily   free water   150 mL Per Tube Q6H   heparin  injection (subcutaneous)  5,000 Units Subcutaneous Q8H   levothyroxine   25 mcg Per Tube Q0600   metoprolol  tartrate  12.5 mg Per Tube BID   multivitamin with minerals  1 tablet Per Tube Daily   mouth rinse  15 mL Mouth Rinse 4 times per day   polyethylene glycol  17 g Per Tube Daily   sodium zirconium cyclosilicate   10 g Oral TID   tacrolimus   0.5 mg Sublingual BID   thiamine   100 mg Per Tube Daily    dextrose  5 % and 0.45 % NaCl 50 mL/hr at 02/12/24 0924   hydrALAZINE , ipratropium-albuterol , LORazepam  **OR** LORazepam , morphine  injection, ondansetron  **OR** ondansetron  (ZOFRAN ) IV, mouth rinse, oxyCODONE , sodium chloride  flush

## 2024-02-12 NOTE — Progress Notes (Signed)
 Patient has removed cortrak, EKG leads and IV. Patient stated that the cortrak fell out of his nose and does not know how everything else came off. This RN cleaned patient up and applied mittens to hands.   Conner MD notified. No further orders

## 2024-02-12 NOTE — Progress Notes (Addendum)
 Trauma/Critical Care Follow Up Note  Subjective:    Overnight Issues: Pulled out his PICC line, cortrak, and a PIV yesterday.  Now has PIV right arm. States he remembers this and was agitated, felt like he needed everything off of him. 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 States he did have kidney dysfunction after his transplant and at some point in time did need dialysis but is not getting HD anymore. Confirms he has been drinking alcohol  at home prior to admission.  Cc is HA. No nausea/vomiting. Objective:  Vital signs for last 24 hours: Temp:  [97.5 F (36.4 C)-98.4 F (36.9 C)] 97.5 F (36.4 C) (06/08 0744) Pulse Rate:  [106-131] 112 (06/08 0744) Resp:  [15-23] 16 (06/08 0744) BP: (115-139)/(72-87) 128/87 (06/08 0744) SpO2:  [91 %-97 %] 96 % (06/08 0744)  Hemodynamic parameters for last 24 hours:    Intake/Output from previous day: 06/07 0701 - 06/08 0700 In: 1275.9 [NG/GT:1275.9] Out: 2050 [Urine:2050]  Intake/Output this shift: No intake/output data recorded.  Vent settings for last 24 hours:    Physical Exam:  Gen: comfortable, no distress Neuro: awake, alert, follows commands, oriented, non-focal exam HEENT: PERRL Neck: supple CV: tachycardic 110 bpm during my exam and regular Pulm: unlabored breathing on room air Abd: soft, NT; interval removal of cortrak so no tube feeds running GU: urine clear and yellow, +spontaneous voids Extr: wwp, no edema  Results for orders placed or performed during the hospital encounter of 02/01/24 (from the past 24 hours)  Glucose, capillary     Status: Abnormal   Collection Time: 02/11/24 11:22 AM  Result Value Ref Range   Glucose-Capillary 123 (H) 70 - 99 mg/dL  Glucose, capillary     Status: Abnormal   Collection Time: 02/11/24  3:07 PM  Result Value Ref Range   Glucose-Capillary 54 (L) 70 - 99 mg/dL  Glucose,  capillary     Status: Abnormal   Collection Time: 02/11/24  3:41 PM  Result Value Ref Range   Glucose-Capillary 137 (H) 70 - 99 mg/dL  Potassium     Status: Abnormal   Collection Time: 02/11/24  4:27 PM  Result Value Ref Range   Potassium 6.2 (H) 3.5 - 5.1 mmol/L  Potassium     Status: Abnormal   Collection Time: 02/11/24  6:40 PM  Result Value Ref Range   Potassium 6.3 (HH) 3.5 - 5.1 mmol/L  Glucose, capillary     Status: Abnormal   Collection Time: 02/11/24  7:17 PM  Result Value Ref Range   Glucose-Capillary 250 (H) 70 - 99 mg/dL  Potassium     Status: Abnormal   Collection Time: 02/11/24  9:37 PM  Result Value Ref Range   Potassium 5.6 (H) 3.5 - 5.1 mmol/L  Glucose, capillary     Status: Abnormal   Collection Time: 02/11/24 10:11 PM  Result Value Ref Range   Glucose-Capillary 102 (H) 70 - 99 mg/dL  Glucose, capillary     Status: None   Collection Time: 02/11/24 11:13 PM  Result Value Ref Range   Glucose-Capillary 95 70 - 99 mg/dL  Basic metabolic panel with GFR     Status: Abnormal   Collection Time: 02/12/24  2:32 AM  Result Value Ref Range   Sodium 137 135 - 145 mmol/L   Potassium 5.8 (H) 3.5 - 5.1 mmol/L   Chloride 102 98 - 111 mmol/L  CO2 26 22 - 32 mmol/L   Glucose, Bld 107 (H) 70 - 99 mg/dL   BUN 098 (H) 8 - 23 mg/dL   Creatinine, Ser 1.19 (H) 0.61 - 1.24 mg/dL   Calcium 14.7 8.9 - 82.9 mg/dL   GFR, Estimated 35 (L) >60 mL/min   Anion gap 9 5 - 15  CBC     Status: Abnormal   Collection Time: 02/12/24  2:32 AM  Result Value Ref Range   WBC 8.4 4.0 - 10.5 K/uL   RBC 2.59 (L) 4.22 - 5.81 MIL/uL   Hemoglobin 7.7 (L) 13.0 - 17.0 g/dL   HCT 56.2 (L) 13.0 - 86.5 %   MCV 95.8 80.0 - 100.0 fL   MCH 29.7 26.0 - 34.0 pg   MCHC 31.0 30.0 - 36.0 g/dL   RDW 78.4 (H) 69.6 - 29.5 %   Platelets 535 (H) 150 - 400 K/uL   nRBC 0.0 0.0 - 0.2 %  Glucose, capillary     Status: Abnormal   Collection Time: 02/12/24  4:08 AM  Result Value Ref Range   Glucose-Capillary 111  (H) 70 - 99 mg/dL  Glucose, capillary     Status: Abnormal   Collection Time: 02/12/24  5:32 AM  Result Value Ref Range   Glucose-Capillary 107 (H) 70 - 99 mg/dL  Glucose, capillary     Status: Abnormal   Collection Time: 02/12/24  7:42 AM  Result Value Ref Range   Glucose-Capillary 103 (H) 70 - 99 mg/dL    Assessment & Plan: The plan of care was discussed with the bedside nurse, Jenette Mitchell, who is in agreement with this plan and no additional concerns were raised.   Present on Admission:  Subdural hematoma (HCC)    LOS: 11 days   Additional comments:I reviewed the patient's new clinical lab test results.   and I reviewed the patients new imaging test results.    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 Rochelle Chu  Loaded with Keppra  in ED, completed 1 week BID keppra , F/U CT head slightly increased SDH but exam stable. TBI therapies.  Bilateral rib fractures that were sustained in a fall on 01/21/2024, similar on follow-up CT scan - multimodal pain control, pulm toilet,  Acute hypoxic ventilator dependent respiratory failure - self-extubated, weaned back to room air, continue flutter, chest PT, duonebs EtoH abuse and withdrawal - CIWA. MVI, thiamine , folic acid . No longer requiring dex or ativan . Tachycardia - Cont tele. Alternates between sinus tach and AF, did receive amio bolus and short period of drip to convert on 6/3, went back into sinus tach, BP continues to remain stable so will increased metop to 12.5 BID on 6/6. I suspect this is related to active withdrawal. Cardiology signed off FEN: D2 diet per SLP.  Nocturnal TF. FWF;pulled out cortrak - encourage PO intake. If unable to take PO meds then will need to place NG tube today. Hypooglycemic overnight requiring D50. Start low rate IVF D5 1/2 NS at 50 mL/hr.  hyperkalemia 6.1 - EKG sinus tach; give lokelma  BID on 6/7 as well as  insulin/dextrose . Remains hyperkalemic today at 5.8. will consult nephrology Dr. Zana Hesselbach. VTE: PAS, [facility] Endo: TSH elevated, T3 P, T4 normal ID: None Foley: none Dispo: therapies. Does not have support for CIR. Likely plan SNF.  4NP; hopefully D/C TF soon,  continue CIWA  Michial Akin, [facility] Please see Amion for pager number  during day hours 7:00am-4:30pm     02/12/2024  *Care during the described time interval was provided by me. I have reviewed this patient's available data, including medical history, events of note, physical examination and test results as part of my evaluation.

## 2024-02-13 ENCOUNTER — Inpatient Hospital Stay (HOSPITAL_COMMUNITY): Payer: Medicare (Managed Care)

## 2024-02-13 LAB — GLUCOSE, CAPILLARY
Glucose-Capillary: 108 mg/dL — ABNORMAL HIGH (ref 70–99)
Glucose-Capillary: 99 mg/dL (ref 70–99)
Glucose-Capillary: 109 mg/dL — ABNORMAL HIGH (ref 70–99)
Glucose-Capillary: 92 mg/dL (ref 70–99)
Glucose-Capillary: 97 mg/dL (ref 70–99)
Glucose-Capillary: 105 mg/dL — ABNORMAL HIGH (ref 70–99)

## 2024-02-13 LAB — COMPREHENSIVE METABOLIC PANEL WITH GFR
ALT: 24 U/L (ref 0–44)
AST: 24 U/L (ref 15–41)
Albumin: 2.1 g/dL — ABNORMAL LOW (ref 3.5–5.0)
Alkaline Phosphatase: 133 U/L — ABNORMAL HIGH (ref 38–126)
Anion gap: 6 (ref 5–15)
BUN: 83 mg/dL — ABNORMAL HIGH (ref 8–23)
CO2: 25 mmol/L (ref 22–32)
Calcium: 9.5 mg/dL (ref 8.9–10.3)
Chloride: 106 mmol/L (ref 98–111)
Creatinine, Ser: 2.33 mg/dL — ABNORMAL HIGH (ref 0.61–1.24)
GFR, Estimated: 29 mL/min — ABNORMAL LOW (ref >60)
Glucose, Bld: 104 mg/dL — ABNORMAL HIGH (ref 70–99)
Potassium: 5.6 mmol/L — ABNORMAL HIGH (ref 3.5–5.1)
Sodium: 137 mmol/L (ref 135–145)
Total Bilirubin: 0.3 mg/dL (ref 0.0–1.2)
Total Protein: 5.8 g/dL — ABNORMAL LOW (ref 6.5–8.1)

## 2024-02-13 LAB — CBC
HCT: 23.4 % — ABNORMAL LOW (ref 39.0–52.0)
Hemoglobin: 7.2 g/dL — ABNORMAL LOW (ref 13.0–17.0)
MCH: 29.8 pg (ref 26.0–34.0)
MCHC: 30.8 g/dL (ref 30.0–36.0)
MCV: 96.7 fL (ref 80.0–100.0)
Platelets: 611 10*3/uL — ABNORMAL HIGH (ref 150–400)
RBC: 2.42 MIL/uL — ABNORMAL LOW (ref 4.22–5.81)
RDW: 17.1 % — ABNORMAL HIGH (ref 11.5–15.5)
WBC: 6.8 10*3/uL (ref 4.0–10.5)
nRBC: 0 % (ref 0.0–0.2)

## 2024-02-13 LAB — POTASSIUM
Potassium: 5.4 mmol/L — ABNORMAL HIGH (ref 3.5–5.1)
Potassium: 5.1 mmol/L (ref 3.5–5.1)
Potassium: 5.2 mmol/L — ABNORMAL HIGH (ref 3.5–5.1)
Potassium: 4.7 mmol/L (ref 3.5–5.1)

## 2024-02-13 MED ORDER — SODIUM CHLORIDE 0.9 % IV SOLN: INTRAVENOUS | Status: AC

## 2024-02-13 MED ORDER — SODIUM ZIRCONIUM CYCLOSILICATE 10 G PO PACK
10.0000 g | PACK | Freq: Once | ORAL | Status: AC
  Administered 2024-02-13: 10 g via ORAL
  Filled 2024-02-13: qty 1

## 2024-02-13 MED ORDER — NEPRO/CARBSTEADY PO LIQD
237.0000 mL | Freq: Two times a day (BID) | ORAL | Status: DC
  Administered 2024-02-13: 237 mL via ORAL

## 2024-02-13 MED ORDER — LACTULOSE 10 GM/15ML PO SOLN
30.0000 g | Freq: Two times a day (BID) | ORAL | Status: AC
  Administered 2024-02-13 – 2024-02-14 (×3): 30 g via ORAL
  Filled 2024-02-13 (×4): qty 45

## 2024-02-13 MED ORDER — PROSOURCE PLUS PO LIQD
30.0000 mL | Freq: Two times a day (BID) | ORAL | Status: DC
  Administered 2024-02-13 – 2024-02-28 (×20): 30 mL via ORAL
  Filled 2024-02-13 (×26): qty 30

## 2024-02-13 MED ORDER — NEPRO/CARBSTEADY PO LIQD
1000.0000 mL | ORAL | Status: DC
  Administered 2024-02-13 – 2024-02-29 (×14): 1000 mL
  Filled 2024-02-13 (×14): qty 1000

## 2024-02-13 MED ORDER — FREE WATER
150.0000 mL | Freq: Four times a day (QID) | Status: DC
  Administered 2024-02-13 – 2024-02-21 (×33): 150 mL

## 2024-02-13 MED ORDER — SODIUM ZIRCONIUM CYCLOSILICATE 10 G PO PACK: 10.0000 g | PACK | Freq: Once | ORAL | Status: DC

## 2024-02-13 MED ORDER — NEPRO/CARBSTEADY PO LIQD: 1000.0000 mL | ORAL | Status: DC

## 2024-02-13 MED ORDER — PROSOURCE TF20 ENFIT COMPATIBL EN LIQD
60.0000 mL | Freq: Every day | ENTERAL | Status: DC
  Administered 2024-02-13 – 2024-02-28 (×14): 60 mL
  Filled 2024-02-13 (×16): qty 60

## 2024-02-13 NOTE — Plan of Care (Signed)
  Problem: Clinical Measurements: Goal: Will remain free from infection Outcome: Progressing Goal: Respiratory complications will improve Outcome: Progressing   Problem: Nutrition: Goal: Adequate nutrition will be maintained Outcome: Progressing   Problem: Coping: Goal: Level of anxiety will decrease Outcome: Progressing   Problem: Elimination: Goal: Will not experience complications related to urinary retention Outcome: Progressing   Problem: Safety: Goal: Ability to remain free from injury will improve Outcome: Progressing   Problem: Education: Goal: Knowledge of General Education information will improve Description: Including pain rating scale, medication(s)/side effects and non-pharmacologic comfort measures Outcome: Not Progressing   Problem: Health Behavior/Discharge Planning: Goal: Ability to manage health-related needs will improve Outcome: Not Progressing

## 2024-02-13 NOTE — Progress Notes (Addendum)
 Nutrition Follow-up  DOCUMENTATION CODES:   Severe malnutrition in context of chronic illness  INTERVENTION:   Continue current diet as ordered per SLP (currently Dysphagia 2, nectar thick liquids) 48 hour calorie count  Encourage PO intake Room service with assist  Continue nocturnal feedings via Cortrak tube: (1800-0600) Nepro Carb Steady @ 75 ml/hr x 12 hours (840 ml per day) Prosource TF20 1x/d 150 ml FWF Q6H - FWF ok per Nephrology  Provides 1566 kcal (87% calorie needs), 88 gm protein (93% protein needs), 698 ml water  (1,298 ml water  daily TF + FWF) Continue: 100 mg thiamine , MVI with minerals, and 1 mg folic acid  daily Daily weights  Mighty Shake TID with meals, each supplement provides 330 kcals and 9 grams of protein Nepro Shake po BID, each supplement provides 425 kcal and 19 grams protein Prosource Plus 60 ml BID each supplement contains 100 kcal and 15 gm protein   If pt not able to increase his PO intake, may benefit from PMT care consult to determine GOC in terms of longterm nutrition   NUTRITION DIAGNOSIS:   Severe Malnutrition related to chronic illness as evidenced by severe fat depletion, severe muscle depletion.  - Still applicable   GOAL:   Patient will meet greater than or equal to 90% of their needs  - Progressing   MONITOR:   TF tolerance  REASON FOR ASSESSMENT:   Consult Enteral/tube feeding initiation and management (nocturnal TF)  ASSESSMENT:   Pt with PMH of ETOH abuse s/p 2 liver transplants, CKD 3b, admitted after fall, found at the bottom of the stairs, admitted with TBI/bilateral SDH, anterior SAH, skull fx, and bilateral rib fxs (from 5/17 fall).  5/30- cortrak placed (gastric), s/p MBSS- dysphagia 2 diet with nectar thick liquids 5/31- s/p BSE- dysphagia 2 diet with nectar thick liquids, transitioned to nocturnal feeds 6/4 - transitioned back to continuous feeds as pt with very poor PO intake 6/7 - Transitioned to Nocturnal tube  feeds  6/8 - Patient pulled out cortrak  6/9 - Cortrak replaced, nocturnal tube feeds restarted   Pt endorses having no appetite. Reports having no dinner last night because he did not feel hungry. This morning had 50% of sausage and eggs, 200 ml of a coke, and 100 ml of a mighty shake. Pt pulled out his cortrak 6/7. Has not been receiving tube feeds for 48 hours. Slight improvement in PO intake this morning.   Has been having issues with hyperkalemia. Nephrology consulted. Suspect pt is dehydrated, BUN was 106, nephrology increased IVF yesterday. Pt on Lokelma . Last BM 6/6 which may be causing high potassium. Nephrology adding lactulose today. Of note pt also on Prograf  which can also cause high potassium.   RD does not suspect pt's hyperkalemia is solely due to pt's tube feeds or ONS. Pt has been refusing most Ensures and states he is only drinking 1 per day at most. In addition pt with very poor PO intake in general.   Potassium now at 5.2, improved from 6.3 on 02/11/2024. BUN is also improving.    Discussed plan with trauma team. Cortrak to be replaced and calorie count to start today. May need PEG if continued poor PO intake, pt endorses poor PO intake prior to admission.    Will switch to renal friendly formula, if hyperkalemia resolves may consider [place] 1.5 again.   Admit weight: 62 kg Current weight: 62.9 kg   Average Meal Intake: 6/2: 25% x 1 meal 6/6: 0% x 2 meal  6/9: 25% x 2 meals   Nutritionally Relevant Medications: Scheduled Meds:  (feeding supplement) PROSource Plus  30 mL Oral BID BM   acetaminophen   1,000 mg Per Tube Q6H   docusate  100 mg Per Tube BID   feeding supplement (NEPRO CARB STEADY)  1,000 mL Per Tube Q24H   feeding supplement (NEPRO CARB STEADY)  237 mL Oral BID BM   feeding supplement (PROSource TF20)  60 mL Per Tube Daily   folic acid   1 mg Per Tube Daily   heparin  injection (subcutaneous)  5,000 Units Subcutaneous Q8H   lactulose  30 g Oral BID    levothyroxine   25 mcg Per Tube Q0600   metoprolol  tartrate  12.5 mg Per Tube BID   multivitamin with minerals  1 tablet Per Tube Daily   mouth rinse  15 mL Mouth Rinse 4 times per day   polyethylene glycol  17 g Per Tube Daily   tacrolimus   0.5 mg Sublingual BID   thiamine   100 mg Per Tube Daily   Continuous Infusions:  sodium chloride  75 mL/hr at 02/13/24 1055   Labs Reviewed: BUN 83 Creatinine 2.33 AP 133 Albumin 2.1 Total protein 5.8 GFR 29  CBG ranges from 104-107 mg/dL over the last 24 hours No A1C   Diet Order:   Diet Order             DIET DYS 2 Room service appropriate? Yes with Assist; Fluid consistency: Nectar Thick  Diet effective now                   EDUCATION NEEDS:   Not appropriate for education at this time  Skin:  Skin Assessment: Skin Integrity Issues: Skin Integrity Issues:: Other (Comment) Other: head laceration, skin tear to lt lower arm  Last BM:  6/6 - type 6  Height:   Ht Readings from Last 1 Encounters:  02/01/24 5\' 8"  (1.727 m)    Weight:   Wt Readings from Last 1 Encounters:  02/13/24 62.9 kg    Ideal Body Weight:  70 kg  BMI:  Body mass index is 21.08 kg/m.  Estimated Nutritional Needs:   Kcal:  1800-2000  Protein:  95-115 grams  Fluid:  > 1.8 L/day   Frederik Jansky, RD Registered Dietitian  See Amion for more information

## 2024-02-13 NOTE — TOC Progression Note (Signed)
 Transition of Care [facility]) - Progression Note    Patient Details  Name: Timithy Arons MRN: [id number] Date of Birth: Jul 10, 1951  Transition of Care [facility]) CM/SW Contact  Yanet Balliet E Albena Comes, LCSW Phone Number: 02/13/2024, 2:38 PM  Clinical Narrative:    Patient got cortrak placed again today. TOC to continue to follow for medical appropriateness for SNF send out.   Expected Discharge Plan: Skilled Nursing Facility Barriers to Discharge: Continued Medical Work up  Expected Discharge Plan and Services In-house Referral: Clinical Social Work     Living arrangements for the past 2 months: Single Family Home                                       Social Determinants of Health (SDOH) Interventions SDOH Screenings   Food Insecurity: No Food Insecurity (02/10/2024)  Housing: Low Risk  (02/10/2024)  Transportation Needs: No Transportation Needs (02/10/2024)  Utilities: Not At Risk (02/10/2024)  Social Connections: Moderately Integrated (02/10/2024)    Readmission Risk Interventions     No data to display

## 2024-02-13 NOTE — Plan of Care (Signed)
   Problem: Activity: Goal: Risk for activity intolerance will decrease Outcome: Progressing   Problem: Nutrition: Goal: Adequate nutrition will be maintained Outcome: Progressing   Problem: Coping: Goal: Level of anxiety will decrease Outcome: Progressing   Problem: Pain Managment: Goal: General experience of comfort will improve and/or be controlled Outcome: Progressing

## 2024-02-13 NOTE — Progress Notes (Signed)
 Physical Therapy Treatment Patient Details Name: [name] MRN: [id number] DOB: [birth year] Today's Date: 02/13/2024   History of Present Illness 73 yo male presenting to ED 5/28 after being found seizing after falling down stairs. CTH bil SDH and SAH with trace IVH and non-depressed occipital skull fx. Intubated 5/28, self-extubated 5/30. Recently sustained bilateral rib fxs after a fall 5/17. Imaging also showed stable subacute fractures involving the right sacral ala, right superior and inferior rami, and left parasymphyseal region along with stable superior endplate compression deformities at T3, T4, and T5 since 01/21/2024. PMH includes ETOH abuse s/p two liver transplants, 5/17 fall with R sacral ala superior and inferior rami fx, L superior endplate compression deformities at T3 T4 T5    PT Comments  Pt progressing well towards his physical therapy goals, requiring less assist overall for bed mobility and transfers. Pt transitioning from bed to chair with RW and CGA. Pt deferring further ambulation due to fatigue. Agreeable to participate in seated exercises and serial sit to stands for BLE functional strengthening and power. HR peak 121 bpm, SpO2 95% on RA, BP 112/78. Patient will benefit from continued inpatient follow up therapy, <3 hours/day in order to address deficits and maximize functional mobility.     If plan is discharge home, recommend the following: Assistance with cooking/housework;Assist for transportation;Help with stairs or ramp for entrance;Supervision due to cognitive status;A little help with walking and/or transfers   Can travel by private vehicle     Yes  Equipment Recommendations  Rolling walker (2 wheels);BSC/3in1;Wheelchair (measurements PT)    Recommendations for Other Services       Precautions / Restrictions Precautions Precautions: Fall;Other (comment) Recall of Precautions/Restrictions: Impaired Precaution/Restrictions Comments: watch  HR Restrictions Weight Bearing Restrictions Per Provider Order: No     Mobility  Bed Mobility Overal bed mobility: Needs Assistance Bed Mobility: Supine to Sit     Supine to sit: Contact guard     General bed mobility comments: Increased time, but no physical assist required    Transfers Overall transfer level: Needs assistance Equipment used: Rolling walker (2 wheels) Transfers: Sit to/from Stand Sit to Stand: Contact guard assist   Step pivot transfers: Contact guard assist       General transfer comment: CGA for safety    Ambulation/Gait                   Stairs             Wheelchair Mobility     Tilt Bed    Modified Rankin (Stroke Patients Only)       Balance Overall balance assessment: Needs assistance Sitting-balance support: No upper extremity supported, Feet supported Sitting balance-Leahy Scale: Fair     Standing balance support: Bilateral upper extremity supported, During functional activity, Reliant on assistive device for balance Standing balance-Leahy Scale: Poor Standing balance comment: reliant on BUE support                            Communication Communication Communication: Impaired Factors Affecting Communication: Hearing impaired  Cognition Arousal: Alert Behavior During Therapy: Flat affect   PT - Cognitive impairments: Awareness, Memory, Attention, Initiation, Safety/Judgement                   Rancho Levels of Cognitive Functioning Rancho Los Amigos Scales of Cognitive Functioning: Confused, Appropriate: Moderate Assistance [email or web address] Scales of Cognitive Functioning: Confused, Appropriate: Moderate Assistance [VI]  Following commands: Impaired Following commands impaired: Only follows one step commands consistently, Follows one step commands with increased time    Cueing Cueing Techniques: Verbal cues, Tactile cues  Exercises General Exercises - Lower Extremity Long Arc Quad:  Both, Seated, 5 reps Hip Flexion/Marching: Both, 5 reps, Seated Other Exercises Other Exercises: x3 serial sit to stands    General Comments        Pertinent Vitals/Pain Pain Assessment Pain Assessment: Faces Faces Pain Scale: Hurts a little bit Pain Location: headache Pain Descriptors / Indicators: Headache Pain Intervention(s): Monitored during session    Home Living                          Prior Function            PT Goals (current goals can now be found in the care plan section) Acute Rehab PT Goals Patient Stated Goal: agreeable to sit up Potential to Achieve Goals: Good Progress towards PT goals: Progressing toward goals    Frequency    Min 3X/week      PT Plan      Co-evaluation              AM-PAC PT "6 Clicks" Mobility   Outcome Measure  Help needed turning from your back to your side while in a flat bed without using bedrails?: A Little Help needed moving from lying on your back to sitting on the side of a flat bed without using bedrails?: A Little Help needed moving to and from a bed to a chair (including a wheelchair)?: A Little Help needed standing up from a chair using your arms (e.g., wheelchair or bedside chair)?: A Little Help needed to walk in hospital room?: A Lot Help needed climbing 3-5 steps with a railing? : A Lot 6 Click Score: 16    End of Session Equipment Utilized During Treatment: Gait belt Activity Tolerance: Patient tolerated treatment well Patient left: with chair alarm set;in chair;with call bell/phone within reach Nurse Communication: Mobility status PT Visit Diagnosis: Unsteadiness on feet (R26.81);Other abnormalities of gait and mobility (R26.89);Muscle weakness (generalized) (M62.81);History of falling (Z91.81);Difficulty in walking, not elsewhere classified (R26.2);Other symptoms and signs involving the nervous system (R29.898)     Time: 1610-9604 PT Time Calculation (min) (ACUTE ONLY): 26  min  Charges:    $Therapeutic Activity: 23-37 mins PT General Charges $$ ACUTE PT VISIT: 1 Visit                     Verdia Glad, PT, DPT Acute Rehabilitation Services Office [phone]    Claria Crofts 02/13/2024, 9:00 AM

## 2024-02-13 NOTE — Progress Notes (Signed)
 [facility] Progress Note  Name: [name] MRN: [id number] DOB: 11-12-1950  Chief Complaint:  Found down after suspected fall by family  Subjective:  He had 1.9 liters UOP over 6/8.  He states that he has been down in [place]  for 20 years.  He commutes to [place]  for yearly follow-up with his liver transplant team (about 7 hours).  His girlfriend is at bedside and called his sister via speakerphone; his sister encouraged him to set up doctors here locally.  He would like to see what will work with his insurance.  I asked him to discuss with SW and primary team.  He just recently saw a kidney doctor and states he's at "a four" but states that he doesn't know what this means.  Seen within the past year and has seen them a couple of times.     Review of systems:  Denies shortness of breath or chest pain  Denies n/v  ----------------------------- Background:  The patient is a 73 y.o. year-old w/ PMH as below who presented after a fall w/ rib fractures, was found by family at the bottom of the stairs after another suspected fall. Pt was seizing. Pt intubated on arrival. Found to have SAH, skull fracture, bilat SDH's, TBI, bilat rib fractures. He suffered etoh withdrawal. He had acute hypoxic resp failure and was on the vent. His renal function has improved from admission, but now the BUN is going up to 100. We are asked to see for renal failure.    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 He did have some kidney problems post transplant and took HD for a while but is not getting it anymore. Pt had been drinking alcohol  again at home.   Pt seen in room. Pt states he is going back to [facility] sometime soon and doesn't need a kidney doctor here. He denies any SOB, cough, leg swelling. Voiding is normal.         Intake/Output Summary (Last 24 hours) at 02/13/2024 0923 Last data filed at 02/13/2024  0640 Gross per 24 hour  Intake 1307.37 ml  Output 1850 ml  Net -542.63 ml    Vitals:  Vitals:   02/13/24 0318 02/13/24 0339 02/13/24 0500 02/13/24 0816  BP:  125/73  123/81  Pulse: (!) 101 100  (!) 107  Resp: 14 19  15   Temp:  98.3 F (36.8 C)  97.9 F (36.6 C)  TempSrc:  Oral  Oral  SpO2: 90% 91%  91%  Weight:   62.9 kg   Height:         Physical Exam:  General adult male in bed in no acute distress HEENT normocephalic atraumatic extraocular movements intact sclera anicteric Neck supple trachea midline Lungs clear to auscultation bilaterally normal work of breathing at rest on room air Heart S1S2 no rub; tachy Abdomen soft nontender nondistended Extremities no edema  Psych normal mood and affect Neuro alert and oriented x 3provides hx and follows commands  Medications reviewed   Labs:     Latest Ref Rng & Units 02/13/2024    5:31 AM 02/13/2024    1:07 AM 02/12/2024    9:35 PM  BMP  Glucose 70 - 99 mg/dL 696     BUN 8 - 23 mg/dL 83     Creatinine 2.95 - 1.24 mg/dL 2.84     Sodium 132 - 440 mmol/L 137  Potassium 3.5 - 5.1 mmol/L 5.6  5.4  5.6   Chloride 98 - 111 mmol/L 106     CO2 22 - 32 mmol/L 25     Calcium 8.9 - 10.3 mg/dL 9.5        Assessment/Plan:    AKI -  May be secondary to pre-renal.  On admission creat was 3.4 and has now leveled off down around the low 2's.  Will give normal saline  He declines to see local nephrologist, says he is "going back to [facility]".  Would choose an alternative to morphine  for pain control given renal failure   Hyperkalemia: secondary to AKI and dietary K intake.   Repeat lokelma   Will need low K diet when diet advanced  I discussed this with the patient and his girlfriend  Start normal saline as above Liver transplant: on prograf  - per primary team  Fall with rib fractures/ debility: per primary team.    Disposition - would continue inpatient monitoring today.  His family is encouraging him to set up nephrology  care here locally - I have recommended this as well.    Nan Aver, MD 02/13/2024 9:49 AM

## 2024-02-13 NOTE — Procedures (Signed)
 Cortrak  Person Inserting Tube:  Derell Bruun T, RD Tube Type:  Cortrak - 43 inches Tube Size:  10 Tube Location:  Left nare Secured by: Bridle Technique Used to Measure Tube Placement:  Marking at nare/corner of mouth Cortrak Secured At:  70 cm   Cortrak Tube Team Note:  Consult received to place a Cortrak feeding tube.   X-ray is required and has been ordered by the Cortrak team. Please confirm placement prior to using tube.  If the tube becomes dislodged please keep the tube and contact the Cortrak team at [email or web address] for replacement.  If after hours and replacement cannot be delayed, place a NG tube and confirm placement with an abdominal x-ray.    Scheryl Cushing RD, LDN Contact via [employer].

## 2024-02-13 NOTE — Progress Notes (Signed)
 Trauma/Critical Care Follow Up Note  Subjective:    Overnight Issues:   Objective:  Vital signs for last 24 hours: Temp:  [97.7 F (36.5 C)-98.7 F (37.1 C)] 97.9 F (36.6 C) (06/09 0816) Pulse Rate:  [79-115] 107 (06/09 0816) Resp:  [10-19] 15 (06/09 0816) BP: (114-132)/(68-84) 123/81 (06/09 0816) SpO2:  [89 %-98 %] 91 % (06/09 0816) Weight:  [62.9 kg] 62.9 kg (06/09 0500)  Hemodynamic parameters for last 24 hours:    Intake/Output from previous day: 06/08 0701 - 06/09 0700 In: 1307.4 [I.V.:1307.4] Out: 1850 [Urine:1850]  Intake/Output this shift: Total I/O In: 300 [P.O.:300] Out: -   Vent settings for last 24 hours:    Physical Exam:  Gen: comfortable, no distress Neuro: follows commands, alert, communicative, oriented x4 HEENT: PERRL Neck: supple CV: RRR Pulm: unlabored breathing on RA Abd: soft, NT  , no recent BM GU: urine clear and yellow, +spontaneous voids Extr: wwp, no edema  Results for orders placed or performed during the hospital encounter of 02/01/24 (from the past 24 hours)  Glucose, capillary     Status: Abnormal   Collection Time: 02/12/24 11:51 AM  Result Value Ref Range   Glucose-Capillary 115 (H) 70 - 99 mg/dL  Potassium     Status: Abnormal   Collection Time: 02/12/24  1:07 PM  Result Value Ref Range   Potassium 6.0 (H) 3.5 - 5.1 mmol/L  Glucose, capillary     Status: None   Collection Time: 02/12/24  3:51 PM  Result Value Ref Range   Glucose-Capillary 96 70 - 99 mg/dL  Potassium     Status: Abnormal   Collection Time: 02/12/24  5:25 PM  Result Value Ref Range   Potassium 5.8 (H) 3.5 - 5.1 mmol/L  Potassium     Status: Abnormal   Collection Time: 02/12/24  9:35 PM  Result Value Ref Range   Potassium 5.6 (H) 3.5 - 5.1 mmol/L  Glucose, capillary     Status: None   Collection Time: 02/12/24 11:23 PM  Result Value Ref Range   Glucose-Capillary 84 70 - 99 mg/dL  Potassium     Status: Abnormal   Collection Time: 02/13/24  1:07  AM  Result Value Ref Range   Potassium 5.4 (H) 3.5 - 5.1 mmol/L  Glucose, capillary     Status: Abnormal   Collection Time: 02/13/24  3:38 AM  Result Value Ref Range   Glucose-Capillary 108 (H) 70 - 99 mg/dL  Comprehensive metabolic panel     Status: Abnormal   Collection Time: 02/13/24  5:31 AM  Result Value Ref Range   Sodium 137 135 - 145 mmol/L   Potassium 5.6 (H) 3.5 - 5.1 mmol/L   Chloride 106 98 - 111 mmol/L   CO2 25 22 - 32 mmol/L   Glucose, Bld 104 (H) 70 - 99 mg/dL   BUN 83 (H) 8 - 23 mg/dL   Creatinine, Ser 1.61 (H) 0.61 - 1.24 mg/dL   Calcium 9.5 8.9 - 09.6 mg/dL   Total Protein 5.8 (L) 6.5 - 8.1 g/dL   Albumin 2.1 (L) 3.5 - 5.0 g/dL   AST 24 15 - 41 U/L   ALT 24 0 - 44 U/L   Alkaline Phosphatase 133 (H) 38 - 126 U/L   Total Bilirubin 0.3 0.0 - 1.2 mg/dL   GFR, Estimated 29 (L) >60 mL/min   Anion gap 6 5 - 15  CBC     Status: Abnormal   Collection Time: 02/13/24  5:31 AM  Result Value Ref Range   WBC 6.8 4.0 - 10.5 K/uL   RBC 2.42 (L) 4.22 - 5.81 MIL/uL   Hemoglobin 7.2 (L) 13.0 - 17.0 g/dL   HCT 16.1 (L) 09.6 - 04.5 %   MCV 96.7 80.0 - 100.0 fL   MCH 29.8 26.0 - 34.0 pg   MCHC 30.8 30.0 - 36.0 g/dL   RDW 40.9 (H) 81.1 - 91.4 %   Platelets 611 (H) 150 - 400 K/uL   nRBC 0.0 0.0 - 0.2 %  Glucose, capillary     Status: None   Collection Time: 02/13/24  8:14 AM  Result Value Ref Range   Glucose-Capillary 99 70 - 99 mg/dL  Potassium     Status: None   Collection Time: 02/13/24  9:34 AM  Result Value Ref Range   Potassium 5.1 3.5 - 5.1 mmol/L    Assessment & Plan:  Present on Admission:  Subdural hematoma (HCC)    LOS: 12 days   Additional comments:I reviewed the patient's new clinical lab test results.   and I reviewed the patients new imaging test results.    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 Rochelle Chu  Loaded with Keppra  in ED,  completed 1 week BID keppra , F/U CT head slightly increased SDH but exam stable. TBI therapies.  Bilateral rib fractures that were sustained in a fall on 01/21/2024, similar on follow-up CT scan - multimodal pain control, pulm toilet,  Acute hypoxic ventilator dependent respiratory failure - self-extubated, weaned back to room air, continue flutter, chest PT, duonebs EtoH abuse and withdrawal - CIWA. MVI, thiamine , folic acid . No longer requiring dex or ativan . Tachycardia - Cont tele. Alternates between sinus tach and AF, did receive amio bolus and short period of drip to convert on 6/3, went back into sinus tach, BP continues to remain stable, metop to 12.5 BID on 6/6. I suspect this is related to active withdrawal. Cardiology signed off FEN: D2 diet per SLP.  Nocturnal TF. FWF;pulled out cortrak - replace hyperkalemia 5.1 - lokelma  BID appreciate nephrology Dr. Zana Hesselbach. Add lactulose VTE: PAS, SQH Endo: TSH elevated, T3 P, T4 normal ID: None Foley: none Dispo: therapies. Does not have support for CIR. Likely plan SNF.  4NP; hopefully D/C TF soon,  continue CIWA   Anda Bamberg, MD Trauma & General Surgery Please use [email or web address] to contact on call provider  02/13/2024  *Care during the described time interval was provided by me. I have reviewed this patient's available data, including medical history, events of note, physical examination and test results as part of my evaluation.

## 2024-02-14 LAB — RENAL FUNCTION PANEL
Albumin: 2 g/dL — ABNORMAL LOW (ref 3.5–5.0)
Anion gap: 9 (ref 5–15)
BUN: 77 mg/dL — ABNORMAL HIGH (ref 8–23)
CO2: 21 mmol/L — ABNORMAL LOW (ref 22–32)
Calcium: 8.8 mg/dL — ABNORMAL LOW (ref 8.9–10.3)
Chloride: 108 mmol/L (ref 98–111)
Creatinine, Ser: 2.2 mg/dL — ABNORMAL HIGH (ref 0.61–1.24)
GFR, Estimated: 31 mL/min — ABNORMAL LOW (ref >60)
Glucose, Bld: 107 mg/dL — ABNORMAL HIGH (ref 70–99)
Phosphorus: 4.4 mg/dL (ref 2.5–4.6)
Potassium: 4.5 mmol/L (ref 3.5–5.1)
Sodium: 138 mmol/L (ref 135–145)

## 2024-02-14 LAB — CBC
HCT: 22.4 % — ABNORMAL LOW (ref 39.0–52.0)
Hemoglobin: 6.8 g/dL — CL (ref 13.0–17.0)
MCH: 29.7 pg (ref 26.0–34.0)
MCHC: 30.4 g/dL (ref 30.0–36.0)
MCV: 97.8 fL (ref 80.0–100.0)
Platelets: 631 10*3/uL — ABNORMAL HIGH (ref 150–400)
RBC: 2.29 MIL/uL — ABNORMAL LOW (ref 4.22–5.81)
RDW: 17.2 % — ABNORMAL HIGH (ref 11.5–15.5)
WBC: 6.1 10*3/uL (ref 4.0–10.5)
nRBC: 0 % (ref 0.0–0.2)

## 2024-02-14 LAB — POTASSIUM
Potassium: 4.6 mmol/L (ref 3.5–5.1)
Potassium: 4.7 mmol/L (ref 3.5–5.1)

## 2024-02-14 LAB — GLUCOSE, CAPILLARY
Glucose-Capillary: 91 mg/dL (ref 70–99)
Glucose-Capillary: 96 mg/dL (ref 70–99)
Glucose-Capillary: 111 mg/dL — ABNORMAL HIGH (ref 70–99)
Glucose-Capillary: 93 mg/dL (ref 70–99)
Glucose-Capillary: 87 mg/dL (ref 70–99)
Glucose-Capillary: 93 mg/dL (ref 70–99)

## 2024-02-14 LAB — PREPARE RBC (CROSSMATCH)

## 2024-02-14 LAB — ABO/RH: ABO/RH(D): O NEG

## 2024-02-14 MED ORDER — SODIUM CHLORIDE 0.9% IV SOLUTION: Freq: Once | INTRAVENOUS | Status: DC

## 2024-02-14 MED ORDER — FERROUS SULFATE 325 (65 FE) MG PO TBEC
325.0000 mg | DELAYED_RELEASE_TABLET | Freq: Three times a day (TID) | ORAL | Status: DC
  Filled 2024-02-14: qty 1

## 2024-02-14 MED ORDER — VITAMIN C 500 MG PO TABS
500.0000 mg | ORAL_TABLET | Freq: Three times a day (TID) | ORAL | Status: DC
  Administered 2024-02-14 – 2024-02-19 (×15): 500 mg via ORAL
  Filled 2024-02-14 (×15): qty 1

## 2024-02-14 MED ORDER — SODIUM CHLORIDE 0.9 % IV SOLN: INTRAVENOUS | Status: AC

## 2024-02-14 MED ORDER — HYDROMORPHONE HCL 1 MG/ML IJ SOLN
0.5000 mg | INTRAMUSCULAR | Status: DC | PRN
  Administered 2024-02-18 – 2024-02-25 (×6): 0.5 mg via INTRAVENOUS
  Filled 2024-02-14 (×6): qty 0.5

## 2024-02-14 MED ORDER — FERROUS SULFATE 325 (65 FE) MG PO TABS
325.0000 mg | ORAL_TABLET | Freq: Three times a day (TID) | ORAL | Status: DC
  Administered 2024-02-14 – 2024-02-22 (×24): 325 mg via ORAL
  Filled 2024-02-14 (×24): qty 1

## 2024-02-14 NOTE — Progress Notes (Addendum)
 Calorie Count Note  48 hour calorie count ordered. To be discontinued today due to poor PO intake and restarted Thursday if intake increases. Encouraged pt to increase PO intake. Spoke to Trauma PA about possibility for PEG. Both in agreement if pt's intake continues to be poor. Having loose BM, on lactulose.   Diet: Dysphagia 2, Nectar thick liquids  Supplements: Nepro, Prosource Plus, Mighty shakes   Breakfast: 50% of sausage and eggs, 200 ml of a coke (260 kcal, 8 gm protein)  Lunch: 0% Dinner: 0% Supplements: 1 Mighty shake, 1 Prosource Plus (430 kcal, 24 gm protein)  Total intake: 690 kcal (38% of minimum estimated needs)  32 gm protein (34% of minimum estimated needs)  INTERVENTION:    Continue current diet as ordered per SLP (currently Dysphagia 2, nectar thick liquids) Encourage PO intake Room service with assist  Continue nocturnal feedings via Cortrak tube: (1800-0600) Nepro Carb Steady @ 83 ml/hr x 12 hours (996 ml per day) Prosource TF20 1x/d 150 ml FWF Q6H - FWF ok per Nephrology  Provides 1,843 kcal (100% calorie needs), 100 gm protein (100% protein needs), 698 ml water  (1,298 ml water  daily TF + FWF)  Continue: 100 mg thiamine , MVI with minerals, and 1 mg folic acid  daily Daily weights Mighty Shake TID with meals, each supplement provides 330 kcals and 9 grams of protein Nepro Shake po BID, each supplement provides 425 kcal and 19 grams protein Prosource Plus 60 ml BID each supplement contains 100 kcal and 15 gm protein    If pt not able to increase his PO intake, may benefit from PMT care consult to determine GOC in terms of longterm nutrition  Estimated Nutritional Needs:  Kcal:  1800-2000 Protein:  95-115 grams Fluid:  > 1.8 L/day   NUTRITION DIAGNOSIS:    Severe Malnutrition related to chronic illness as evidenced by severe fat depletion, severe muscle depletion.   - Still applicable    GOAL:    Patient will meet greater than or equal to 90% of their  needs   - Progressing   Frederik Jansky, RD Registered Dietitian  See Amion for more information

## 2024-02-14 NOTE — Plan of Care (Signed)

## 2024-02-14 NOTE — Progress Notes (Signed)
 Trauma/Critical Care Follow Up Note  Subjective:    Overnight Issues: NAEO - diarrhea after lactulose and rectal tube placed.   Objective:  Vital signs for last 24 hours: Temp:  [96 F (35.6 C)-98.2 F (36.8 C)] 97.8 F (36.6 C) (06/10 0729) Pulse Rate:  [77-109] 101 (06/10 0729) Resp:  [14-19] 19 (06/10 0729) BP: (86-126)/(47-82) 112/82 (06/10 0729) SpO2:  [91 %-96 %] 92 % (06/10 0729) Weight:  [60 kg] 60 kg (06/10 0305)  Hemodynamic parameters for last 24 hours:    Intake/Output from previous day: 06/09 0701 - 06/10 0700 In: 2515.8 [P.O.:540; I.V.:1264.5; NG/GT:711.3] Out: 1400 [Urine:1400]  Intake/Output this shift: No intake/output data recorded.  Vent settings for last 24 hours:    Physical Exam:  Gen: comfortable, no distress Neuro: follows commands, alert, communicative, oriented x4 HEENT: PERRL Neck: supple CV: RRR Pulm: unlabored breathing on RA Abd: soft, NT  , multiple BMs following lactulose, rectal tube in place with non-bloody liquid stool  GU: urine clear and yellow, +spontaneous voids Extr: wwp, no edema  Results for orders placed or performed during the hospital encounter of 02/01/24 (from the past 24 hours)  Glucose, capillary     Status: Abnormal   Collection Time: 02/13/24 11:51 AM  Result Value Ref Range   Glucose-Capillary 109 (H) 70 - 99 mg/dL  Glucose, capillary     Status: None   Collection Time: 02/13/24  3:34 PM  Result Value Ref Range   Glucose-Capillary 92 70 - 99 mg/dL  Potassium     Status: Abnormal   Collection Time: 02/13/24  4:39 PM  Result Value Ref Range   Potassium 5.2 (H) 3.5 - 5.1 mmol/L  Glucose, capillary     Status: None   Collection Time: 02/13/24  7:54 PM  Result Value Ref Range   Glucose-Capillary 97 70 - 99 mg/dL  Potassium     Status: None   Collection Time: 02/13/24  8:32 PM  Result Value Ref Range   Potassium 4.7 3.5 - 5.1 mmol/L  Glucose, capillary     Status: Abnormal   Collection Time: 02/13/24  11:04 PM  Result Value Ref Range   Glucose-Capillary 105 (H) 70 - 99 mg/dL  Potassium     Status: None   Collection Time: 02/14/24  1:23 AM  Result Value Ref Range   Potassium 4.6 3.5 - 5.1 mmol/L  Glucose, capillary     Status: None   Collection Time: 02/14/24  3:21 AM  Result Value Ref Range   Glucose-Capillary 91 70 - 99 mg/dL  Potassium     Status: None   Collection Time: 02/14/24  5:45 AM  Result Value Ref Range   Potassium 4.7 3.5 - 5.1 mmol/L  Glucose, capillary     Status: None   Collection Time: 02/14/24  7:54 AM  Result Value Ref Range   Glucose-Capillary 96 70 - 99 mg/dL    Assessment & Plan:  Present on Admission:  Subdural hematoma (HCC)    LOS: 13 days   Additional comments:I reviewed the patient's new clinical lab test results.   and I reviewed the patients new imaging test results.    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 Rochelle Chu  Loaded with Keppra  in ED, completed 1 week BID keppra , F/U CT head slightly increased SDH but exam stable. TBI therapies.  Bilateral rib fractures that were sustained in a fall on 01/21/2024, similar on follow-up  CT scan - multimodal pain control, pulm toilet,  Acute hypoxic ventilator dependent respiratory failure - self-extubated, weaned back to room air, continue flutter, chest PT, duonebs EtoH abuse and withdrawal - CIWA. MVI, thiamine , folic acid . No longer requiring dex or ativan . Tachycardia - Cont tele. Alternates between sinus tach and AF, did receive amio bolus and short period of drip to convert on 6/3, went back into sinus tach, BP continues to remain stable, metop to 12.5 BID on 6/6. I suspect this is related to active withdrawal. Cardiology signed off FEN: D2 diet per SLP.  Nocturnal TF, stop calorie count and re-attempt later this week if PO intake improving; hyperkalemia improved to 4.7 s/p NS and lokelma .  Appreciate nephrology. Pt not taking morphine  but I did stop this medication due to nephrotoxicity.  VTE: PAS, SQH Endo: TSH elevated, T3 P, T4 normal ID: None Foley: none Dispo: therapies. Does not have support for CIR. Likely plan SNF.  4NP; hopefully D/C TF soon - or will need PEG,  continue CIWA Follow up renal panel, D/C rectal tube tomorrow after lactulose stops    Michial Akin, [facility] Surgery Please see Amion for pager number during day hours 7:00am-4:30pm       02/14/2024  *Care during the described time interval was provided by me. I have reviewed this patient's available data, including medical history, events of note, physical examination and test results as part of my evaluation.

## 2024-02-14 NOTE — Progress Notes (Signed)
 RN notified that pt has a critical result of hemoglobin of 6.8. RN notified the PA about the critical result.

## 2024-02-14 NOTE — Care Management Important Message (Signed)
 Important Message  Patient Details  Name: [name] MRN: [id number] Date of Birth: Feb 03, 1951   Important Message Given:  Yes - Medicare IM     [name] 02/14/2024, 1:18 PM

## 2024-02-14 NOTE — Progress Notes (Signed)
 Speech Language Pathology Treatment: Dysphagia;Cognitive-Linguistic  Patient Details Name: [name] MRN: [id number] DOB: [birth year] Today's Date: 02/14/2024 Time: 4696-2952 SLP Time Calculation (min) (ACUTE ONLY): 16 min  Assessment / Plan / Recommendation Clinical Impression  Pt progressing towards goals related to dysphagia and cognition. He demonstrates improving awareness of memory deficits. Pt continues to be oriented to all aspects of time except for his age, although his answer today was closer in accuracy than in previous sessions. He also demonstrates improvements related to reasoning and was 100% accurate with concrete verbal reasoning tasks. His vocal quality appeared subjectively stronger today and he completed EMST set to 15 cm/H2O x10. When challenged with a higher setting, he was unable to progress past 3 reps but could convey increased difficulty given prompting. He continues to decline solid POs but he did agree to take a few sips of nectar thick liquid, which result in throat clearance. Plan to repeat MBS for an updated assessment of his swallowing in light of discussion re: PEG placement. SLP will f/u.    HPI HPI: [name] is a 73 yo male presenting to ED 5/28 after being found seizing after falling down stairs. CTH shows bilateral SDH and SAH with trace IVH and non-depressed occipital skull fx. Intubated 5/28, self-extubated 5/30. Recently sustained bilateral rib fxs after a fall 5/17. PMH includes ETOH abuse s/p two liver transplants      SLP Plan  MBS          Recommendations  Diet recommendations: Dysphagia 2 (fine chop);Nectar-thick liquid Liquids provided via: Cup;Straw Medication Administration: Crushed with puree Supervision: Staff to assist with self feeding Compensations: Minimize environmental distractions;Slow rate;Small sips/bites;Clear throat intermittently;Multiple dry swallows after each bite/sip Postural Changes and/or Swallow Maneuvers:  Seated upright 90 degrees;Upright 30-60 min after meal                  Oral care before and after PO;Oral care BID   Frequent or constant Supervision/Assistance Cognitive communication deficit (R41.841);Dysphagia, pharyngeal phase (R13.13)     MBS     Amil Kale, M.A., CCC-SLP Speech Language Pathology, Acute Rehabilitation Services  Secure Chat preferred [phone]   02/14/2024, 5:06 PM

## 2024-02-14 NOTE — Progress Notes (Signed)
 [facility] Progress Note  Name: [name] MRN: [id number] DOB: 22-Feb-1951  Chief Complaint:  Found down after suspected fall by family  Subjective:  He had 1.4 liters UOP over 6/9.  His sister has encouraged him to set up doctors here locally.  Previously has seen nephrology in [place] .  He does agree to set up nephrology care here in [place].  He has been on NS at 75 ml/hr. From his other chart (marked for merge) baseline Cr is mid 2's with prior AKI   Review of systems:    Denies shortness of breath or chest pain  Denies n/v Not taking much PO Had cortrak placed  ----------------------------- Background:  The patient is a 73 y.o. year-old w/ PMH as below who presented after a fall w/ rib fractures, was found by family at the bottom of the stairs after another suspected fall. Pt was seizing. Pt intubated on arrival. Found to have SAH, skull fracture, bilat SDH's, TBI, bilat rib fractures. He suffered etoh withdrawal. He had acute hypoxic resp failure and was on the vent. His renal function has improved from admission, but now the BUN is going up to 100. We are asked to see for renal failure.    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 He did have some kidney problems post transplant and took HD for a while but is not getting it anymore. Pt had been drinking alcohol  again at home.   Pt seen in room. Pt states he is going back to [facility] sometime soon and doesn't need a kidney doctor here. He denies any SOB, cough, leg swelling. Voiding is normal.         Intake/Output Summary (Last 24 hours) at 02/14/2024 0912 Last data filed at 02/14/2024 0347 Gross per 24 hour  Intake 2215.79 ml  Output 1400 ml  Net 815.79 ml    Vitals:  Vitals:   02/13/24 2236 02/14/24 0300 02/14/24 0305 02/14/24 0729  BP: (!) 86/51 (!) 96/47  112/82  Pulse: (!) 102   (!) 101  Resp: 15   19  Temp: 98 F (36.7 C) (!)  96 F (35.6 C)  97.8 F (36.6 C)  TempSrc: Oral Axillary  Oral  SpO2: 96%   92%  Weight:   60 kg   Height:         Physical Exam:    General adult male in bed in no acute distress HEENT normocephalic atraumatic extraocular movements intact sclera anicteric; cortrak in place Neck supple trachea midline Lungs clear to auscultation bilaterally normal work of breathing at rest on room air Heart S1S2 no rub; tachy Abdomen soft nontender nondistended Extremities no edema  Psych normal mood and affect Neuro alert and oriented x 3provides hx and follows commands  Medications reviewed   Labs:     Latest Ref Rng & Units 02/14/2024    5:45 AM 02/14/2024    1:23 AM 02/13/2024    8:32 PM  BMP  Potassium 3.5 - 5.1 mmol/L 4.7  4.6  4.7      Assessment/Plan:    AKI May be secondary to pre-renal insults.  On admission creat was 3.4 and has now leveled off down around the low 2's, which appears to be his baseline S/p normal saline (set to end after 24 hours); may need to resume    Obtain renal panel today and daily - will follow-up today's labs He has previously  declined to set up local nephrologist and his family has encouraged him to reconsider and to establish care here (as he lives here x 20 years)  Would please choose an alternative to morphine  for pain control given renal failure   CKD stage IV - baseline Cr is in the mid 2's.  We have encouraged him to establish care here as [place] currently - he does agree  Hyperkalemia: secondary to AKI and dietary K intake.   Improved s/p lokelma  and normal saline Will need low K diet when diet advanced  I discussed this with the patient and his girlfriend.  On nepro for feeds  Liver transplant: on prograf  - per primary team  Fall with rib fractures/ debility: per primary team.    Disposition - await AM labs to guide.  We will need to set up outpatient follow-up with [facility] when he is discharged  Nan Aver, MD 02/14/2024 9:31 AM

## 2024-02-14 NOTE — Plan of Care (Signed)

## 2024-02-15 ENCOUNTER — Inpatient Hospital Stay (HOSPITAL_COMMUNITY): Payer: Medicare (Managed Care)

## 2024-02-15 LAB — GLUCOSE, CAPILLARY
Glucose-Capillary: 94 mg/dL (ref 70–99)
Glucose-Capillary: 108 mg/dL — ABNORMAL HIGH (ref 70–99)
Glucose-Capillary: 102 mg/dL — ABNORMAL HIGH (ref 70–99)
Glucose-Capillary: 96 mg/dL (ref 70–99)
Glucose-Capillary: 90 mg/dL (ref 70–99)
Glucose-Capillary: 98 mg/dL (ref 70–99)

## 2024-02-15 LAB — RENAL FUNCTION PANEL
Albumin: 2.1 g/dL — ABNORMAL LOW (ref 3.5–5.0)
Anion gap: 10 (ref 5–15)
BUN: 66 mg/dL — ABNORMAL HIGH (ref 8–23)
CO2: 20 mmol/L — ABNORMAL LOW (ref 22–32)
Calcium: 8.9 mg/dL (ref 8.9–10.3)
Chloride: 109 mmol/L (ref 98–111)
Creatinine, Ser: 2 mg/dL — ABNORMAL HIGH (ref 0.61–1.24)
GFR, Estimated: 35 mL/min — ABNORMAL LOW (ref >60)
Glucose, Bld: 110 mg/dL — ABNORMAL HIGH (ref 70–99)
Phosphorus: 4.3 mg/dL (ref 2.5–4.6)
Potassium: 4.1 mmol/L (ref 3.5–5.1)
Sodium: 139 mmol/L (ref 135–145)

## 2024-02-15 LAB — HEMOGLOBIN AND HEMATOCRIT, BLOOD
HCT: 26.6 % — ABNORMAL LOW (ref 39.0–52.0)
Hemoglobin: 8.5 g/dL — ABNORMAL LOW (ref 13.0–17.0)

## 2024-02-15 MED ORDER — SODIUM CHLORIDE 0.9% FLUSH: 10.0000 mL | INTRAVENOUS | Status: DC | PRN

## 2024-02-15 MED ORDER — BOOST / RESOURCE BREEZE PO LIQD CUSTOM
1.0000 | Freq: Three times a day (TID) | ORAL | Status: DC
  Administered 2024-02-15 – 2024-02-17 (×3): 1 via ORAL

## 2024-02-15 NOTE — Progress Notes (Signed)
 Nutrition Brief Note  Spoke to RN about patient. RN reports pt has not been touching his meals. Does drink some juice. Refuses all supplements. Pt might drink Boost Breeze since it is more of a juice consistency  compared to Nepro.   Pt does not want a PEG. Spoke to Dr.Thompson, plan is to have GOC with family to decide next steps.   INTERVENTION:    Continue current diet as ordered per SLP (currently Dysphagia 2, nectar thick liquids) Encourage PO intake Room service with assist  Continue nocturnal feedings via Cortrak tube: (1800-0600) Nepro Carb Steady @ 83 ml/hr x 12 hours (996 ml per day) Prosource TF20 1x/d 150 ml FWF Q6H - FWF ok per Nephrology  Provides 1,843 kcal (100% calorie needs), 100 gm protein (100% protein needs), 698 ml water  (1,298 ml water  daily TF + FWF)   Continue: 100 mg thiamine , MVI with minerals, and 1 mg folic acid  daily Daily weights Continue Mighty Shake TID with meals, each supplement provides 330 kcals and 9 grams of protein Add Boost Breeze po TID, each supplement provides 250 kcal and 9 grams of protein Continue Prosource Plus 60 ml BID each supplement contains 100 kcal and 15 gm protein    If pt not able to increase his PO intake, may benefit from PMT care consult to determine GOC in terms of longterm nutrition   Estimated Nutritional Needs:  Kcal:  1800-2000 Protein:  95-115 grams Fluid:  > 1.8 L/day   NUTRITION DIAGNOSIS:    Severe Malnutrition related to chronic illness as evidenced by severe fat depletion, severe muscle depletion.   - Still applicable    GOAL:    Patient will meet greater than or equal to 90% of their needs   - Progressing    Frederik Jansky, RD Registered Dietitian  See Amion for more information

## 2024-02-15 NOTE — Progress Notes (Signed)
 Pt back on the unit from their MBS.

## 2024-02-15 NOTE — Progress Notes (Signed)
 Modified Barium Swallow Study  Patient Details  Name: [name] MRN: [id number] Date of Birth: 1951/08/15  Today's Date: 02/15/2024  Modified Barium Swallow completed.  Full report located under Chart Review in the Imaging Section.  History of Present Illness [name] is a 73 yo male presenting to ED 5/28 after being found seizing after falling down stairs. CTH shows bilateral SDH and SAH with trace IVH and non-depressed occipital skull fx. Intubated 5/28, self-extubated 5/30. Recently sustained bilateral rib fxs after a fall 5/17. PMH includes ETOH abuse s/p two liver transplants   Clinical Impression Pt presents with improved swallow function compared to [facility] 5/30, now exhibiting mild pharyngeal dysphagia. Epiglottic inversion is now complete, allowing him to better protect his airway both during and after the swallow. While there is still a collection of pharyngeal residue, it is now less than half of each bolus. All phases of the swallow were increasingly disorganized when given the 13 mm barium tablet with thin liquids. He initially protected his airway well despite tilting his head posteriorly as the tablet lodged in the valleculae. He coughed (suspect secondary to globus sensation), resulting in retrograde bolus flow up to the level of the valleculae, which dislodged the tablet but did result in sensed aspiration (PAS 7). Challenged pt with sequential sips of large bolus sizes with no repeated penetration/aspiration. Recommend upgrading to regular solids and thin liquids but pills should be given primarily crushed in puree (can give essential uncrushable meds whole with puree if small in size). SLP will f/u at least briefly to provide education but do not anticipate post-acute needs for dysphagia. Factors that may increase risk of adverse event in presence of aspiration [name] & [name] 2021): Reduced cognitive function;Limited mobility;Presence of tubes (ETT, trach, NG,  etc.)  Swallow Evaluation Recommendations Recommendations: PO diet PO Diet Recommendation: Regular;Thin liquids (Level 0) Liquid Administration via: Cup;Straw Medication Administration: Crushed with puree Supervision: Staff to assist with self-feeding;Full supervision/cueing for swallowing strategies Swallowing strategies  : Minimize environmental distractions;Slow rate;Small bites/sips;Multiple dry swallows after each bite/sip Postural changes: Position pt fully upright for meals;Stay upright 30-60 min after meals Oral care recommendations: Oral care BID (2x/day)    Amil Kale, M.A., CCC-SLP Speech Language Pathology, Acute Rehabilitation Services  Secure Chat preferred [phone]  02/15/2024,1:54 PM

## 2024-02-15 NOTE — Plan of Care (Signed)

## 2024-02-15 NOTE — Progress Notes (Signed)
 Patient ID: [name], male   DOB: April 04, 1951, 73 y.o.   MRN: [id number]      Subjective: C/O mild HA Reports he wants to avoid having to go to a rehab facility ROS negative except as listed above. Objective: Vital signs in last 24 hours: Temp:  [97.4 F (36.3 C)-98.3 F (36.8 C)] 97.9 F (36.6 C) (06/11 0734) Pulse Rate:  [95-110] 110 (06/11 0811) Resp:  [13-19] 17 (06/11 0811) BP: (134-147)/(63-84) 134/84 (06/11 0734) SpO2:  [91 %-99 %] 95 % (06/11 0811) Weight:  [59 kg] 59 kg (06/11 0311) Last BM Date : 02/13/24  Intake/Output from previous day: 06/10 0701 - 06/11 0700 In: 3431.3 [P.O.:480; I.V.:712.6; NG/GT:2238.7] Out: 500 [Urine:500] Intake/Output this shift: No intake/output data recorded.  General appearance: alert and cooperative Resp: clear to auscultation bilaterally GI: soft, NT Extremities: calves soft  Lab Results: CBC  Recent Labs    02/13/24 0531 02/14/24 1006 02/15/24 0615  WBC 6.8 6.1  --   HGB 7.2* 6.8* 8.5*  HCT 23.4* 22.4* 26.6*  PLT 611* 631*  --    BMET Recent Labs    02/14/24 1006 02/15/24 0615  NA 138 139  K 4.5 4.1  CL 108 109  CO2 21* 20*  GLUCOSE 107* 110*  BUN 77* 66*  CREATININE 2.20* 2.00*  CALCIUM 8.8* 8.9   PT/INR No results for input(s): LABPROT, INR in the last 72 hours. ABG No results for input(s): PHART, HCO3 in the last 72 hours.  Invalid input(s): PCO2, PO2  Studies/Results: DG Abd Portable 1V Result Date: 02/13/2024 CLINICAL DATA:  Feeding tube placement. EXAM: PORTABLE ABDOMEN - 1 VIEW COMPARISON:  Radiograph 02/03/2024 FINDINGS: Tip of the weighted enteric tube in the right upper quadrant in the region of the distal stomach or proximal duodenum. There is contrast in the colon from prior barium swallow. IMPRESSION: Tip of the weighted enteric tube in the right upper quadrant in the region of the distal stomach or proximal duodenum. Electronically Signed   By: Chadwick Colonel M.D.   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 Rochelle Chu  Loaded with Keppra  in ED, completed 1 week BID keppra , F/U CT head slightly increased SDH but exam stable. TBI therapies.  Bilateral rib fractures that were sustained in a fall on 01/21/2024, similar on follow-up CT scan - multimodal pain control, pulm toilet,  Acute hypoxic ventilator dependent respiratory failure - self-extubated, weaned back to room air, continue flutter, chest PT, duonebs EtoH abuse and withdrawal - CIWA. MVI, thiamine , folic acid . No longer requiring dex or ativan . Tachycardia - Cont tele. Alternates between sinus tach and AF, did receive amio bolus and short period of drip to convert on 6/3, went back into sinus tach, BP continues to remain stable, metop to 12.5 BID on 6/6. I suspect this is related to active withdrawal. Cardiology signed off FEN: D2 diet per SLP.  Nocturnal TF, encourage PO CKD/AKI - appreciate Nephrology F/U, CRT 2 today which looks better than baseline VTE: PAS, SQH Endo: TSH elevated, T3 P, T4 normal ID: None Foley: none Dispo: 4NP, therapies. Likely will need SNF but he says eh does not want to do that. TF, may need PEG. Encouraged PO. D/C rectal tube.   LOS: 14 days    Dorena Gander, MD, MPH, FACS  Trauma & General Surgery Use [email or web address] to contact on call provider  02/15/2024

## 2024-02-15 NOTE — Progress Notes (Signed)
 [facility] Progress Note  Name: [name] MRN: [id number] DOB: November 04, 1950  Chief Complaint:  Found down after suspected fall by family  Subjective:  He had 500 mL UOP over 6/10.  He is s/p NS at 75 ml/hr x 12 hours. Per surgery's note, encouraging PO but they feel he may need PEG to go to SNF.  He asks if his girlfriend can pick him up today to take him to the drugstore to pick up a few things and I asked that he start off by reaching out to nursing and primary team if there is anything that he needs (as well as family)  Review of systems:     Denies shortness of breath or chest pain  Denies n/v; loose stools per pt Not taking much PO Had cortrak placed  ----------------------------- Background:  The patient is a 73 y.o. year-old w/ PMH as below who presented after a fall w/ rib fractures, was found by family at the bottom of the stairs after another suspected fall. Pt was seizing. Pt intubated on arrival. Found to have SAH, skull fracture, bilat SDH's, TBI, bilat rib fractures. He suffered etoh withdrawal. He had acute hypoxic resp failure and was on the vent. His renal function has improved from admission, but now the BUN is going up to 100. We are asked to see for renal failure.    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 He did have some kidney problems post transplant and took HD for a while but is not getting it anymore. Pt had been drinking alcohol  again at home.   Pt seen in room. Pt states he is going back to [facility] sometime soon and doesn't need a kidney doctor here. He denies any SOB, cough, leg swelling. Voiding is normal.     From his other chart (now marked for merge) baseline Cr is mid 2's with prior AKI     Intake/Output Summary (Last 24 hours) at 02/15/2024 0918 Last data filed at 02/15/2024 0323 Gross per 24 hour  Intake 3431.25 ml  Output 500 ml  Net 2931.25 ml    Vitals:   Vitals:   02/15/24 0311 02/15/24 0354 02/15/24 0734 02/15/24 0811  BP:  139/83 134/84   Pulse:   (!) 104 (!) 110  Resp:   13 17  Temp:  (!) 97.4 F (36.3 C) 97.9 F (36.6 C)   TempSrc:  Oral Oral   SpO2:   94% 95%  Weight: 59 kg     Height:         Physical Exam:     General adult male in bed in no acute distress HEENT normocephalic atraumatic extraocular movements intact sclera anicteric; cortrak in place Neck supple trachea midline Lungs clear to auscultation bilaterally normal work of breathing at rest on room air; wet cough  Heart S1S2 no rub; tachy Abdomen soft nontender nondistended Extremities no edema  Psych normal mood and affect Neuro alert and oriented x 3provides hx and follows commands  Medications reviewed   Labs:     Latest Ref Rng & Units 02/15/2024    6:15 AM 02/14/2024   10:06 AM 02/14/2024    5:45 AM  BMP  Glucose 70 - 99 mg/dL 696  295    BUN 8 - 23 mg/dL 66  77    Creatinine 2.84 - 1.24 mg/dL 1.32  4.40    Sodium 102 - 145 mmol/L 139  138    Potassium 3.5 - 5.1 mmol/L 4.1  4.5  4.7   Chloride 98 - 111 mmol/L 109  108    CO2 22 - 32 mmol/L 20  21    Calcium 8.9 - 10.3 mg/dL 8.9  8.8       Assessment/Plan:    AKI May be secondary to pre-renal insults.  On admission creat was 3.4 and has now leveled off down around the low 2's, which appears to be his baseline He is s/p hydration.  Hold off on additional fluids today for now Renal panel daily Nutrition per primary team   CKD stage IV - baseline Cr is in the mid 2's.  We have encouraged him to establish care here as [place] currently - he does agree; we will set him up for follow-up with [facility] on discharge   Hyperkalemia: secondary to AKI and dietary K intake.   Improved s/p lokelma  and normal saline Will need low K diet when diet advanced  I discussed this with the patient and his girlfriend.  On nepro for feeds  Liver transplant: on prograf  - per  primary team  Fall with rib fractures/ debility: per primary team.    Disposition - per primary team discretion.  We will need to set up outpatient follow-up with [facility] when he is discharged  Nan Aver, MD 02/15/2024 9:32 AM

## 2024-02-15 NOTE — Progress Notes (Signed)
 Pt off the unit to radiology for MBS

## 2024-02-15 NOTE — Plan of Care (Signed)

## 2024-02-15 NOTE — Progress Notes (Signed)
 Physical Therapy Treatment Patient Details Name: [name] MRN: [id number] DOB: [birth year] Today's Date: 02/15/2024   History of Present Illness 73 yo male presenting to ED 5/28 after being found seizing after falling down stairs. CTH bil SDH and SAH with trace IVH and non-depressed occipital skull fx. Intubated 5/28, self-extubated 5/30. Recently sustained bilateral rib fxs after a fall 5/17. Imaging also showed stable subacute fractures involving the right sacral ala, right superior and inferior rami, and left parasymphyseal region along with stable superior endplate compression deformities at T3, T4, and T5 since 01/21/2024. PMH includes ETOH abuse s/p two liver transplants, 5/17 fall with R sacral ala superior and inferior rami fx, L superior endplate compression deformities at T3 T4 T5    PT Comments  Pt agreeable to participate in therapy session, however, declines sitting up in chair. Pt demonstrates progress in transitioning to edge of bed and to standing with CGA. Ambulating 30 ft with a walker with slowed step to pattern. Pt has one gross loss of balance, requiring minA to correct. Presents as a high fall risk based on decreased gait speed and decreased safety awareness. Patient will benefit from continued inpatient follow up therapy, <3 hours/day.    If plan is discharge home, recommend the following: Assistance with cooking/housework;Assist for transportation;Help with stairs or ramp for entrance;Supervision due to cognitive status;A little help with walking and/or transfers   Can travel by private vehicle     Yes  Equipment Recommendations  Rolling walker (2 wheels);BSC/3in1;Wheelchair (measurements PT)    Recommendations for Other Services       Precautions / Restrictions Precautions Precautions: Fall;Other (comment) Recall of Precautions/Restrictions: Impaired Precaution/Restrictions Comments: flexi seal, cortrak, watch HR Restrictions Weight Bearing Restrictions Per  Provider Order: No     Mobility  Bed Mobility Overal bed mobility: Needs Assistance Bed Mobility: Supine to Sit, Sit to Supine     Supine to sit: Supervision Sit to supine: Supervision        Transfers Overall transfer level: Needs assistance Equipment used: Rolling walker (2 wheels) Transfers: Sit to/from Stand Sit to Stand: Contact guard assist                Ambulation/Gait Ambulation/Gait assistance: Min assist Gait Distance (Feet): 30 Feet Assistive device: Rolling walker (2 wheels) Gait Pattern/deviations: Step-to pattern, Decreased dorsiflexion - right, Decreased stride length Gait velocity: decreased     General Gait Details: Slowed step to pattern, with decreased R foot clearance and heel strike at initial contact   Stairs             Wheelchair Mobility     Tilt Bed    Modified Rankin (Stroke Patients Only)       Balance Overall balance assessment: Needs assistance Sitting-balance support: No upper extremity supported, Feet supported Sitting balance-Leahy Scale: Good     Standing balance support: Bilateral upper extremity supported Standing balance-Leahy Scale: Poor                              Communication Communication Communication: Impaired Factors Affecting Communication: Hearing impaired  Cognition Arousal: Alert Behavior During Therapy: Flat affect   PT - Cognitive impairments: Awareness, Memory, Attention, Initiation, Safety/Judgement                   Rancho Levels of Cognitive Functioning Rancho Los Amigos Scales of Cognitive Functioning: Automatic, Appropriate: Minimal Assistance for Daily Living Skills Rancho Mirant Scales of Cognitive  Functioning: Automatic, Appropriate: Minimal Assistance for Daily Living Skills [VII]   Following commands: Impaired Following commands impaired: Only follows one step commands consistently, Follows one step commands with increased time    Cueing Cueing  Techniques: Verbal cues, Tactile cues  Exercises      General Comments        Pertinent Vitals/Pain Pain Assessment Pain Assessment: No/denies pain    Home Living                          Prior Function            PT Goals (current goals can now be found in the care plan section) Acute Rehab PT Goals Potential to Achieve Goals: Good Progress towards PT goals: Progressing toward goals    Frequency    Min 3X/week      PT Plan      Co-evaluation              AM-PAC PT 6 Clicks Mobility   Outcome Measure  Help needed turning from your back to your side while in a flat bed without using bedrails?: A Little Help needed moving from lying on your back to sitting on the side of a flat bed without using bedrails?: A Little Help needed moving to and from a bed to a chair (including a wheelchair)?: A Little Help needed standing up from a chair using your arms (e.g., wheelchair or bedside chair)?: A Little Help needed to walk in hospital room?: A Little Help needed climbing 3-5 steps with a railing? : A Lot 6 Click Score: 17    End of Session Equipment Utilized During Treatment: Gait belt Activity Tolerance: Patient tolerated treatment well Patient left: in bed;with call bell/phone within reach;with bed alarm set Nurse Communication: Mobility status PT Visit Diagnosis: Unsteadiness on feet (R26.81);Other abnormalities of gait and mobility (R26.89);Muscle weakness (generalized) (M62.81);History of falling (Z91.81);Difficulty in walking, not elsewhere classified (R26.2);Other symptoms and signs involving the nervous system (R29.898)     Time: 1610-9604 PT Time Calculation (min) (ACUTE ONLY): 16 min  Charges:    $Therapeutic Activity: 8-22 mins PT General Charges $$ ACUTE PT VISIT: 1 Visit                     Verdia Glad, PT, DPT Acute Rehabilitation Services Office [phone]    Claria Crofts 02/15/2024, 1:25 PM

## 2024-02-15 NOTE — Progress Notes (Signed)
 Occupational Therapy Treatment Patient Details Name: [name] MRN: [id number] DOB: November 12, 1950 Today's Date: 02/15/2024   History of present illness 73 yo male presenting to ED 5/28 after being found seizing after falling down stairs. CTH bil SDH and SAH with trace IVH and non-depressed occipital skull fx. Intubated 5/28, self-extubated 5/30. Recently sustained bilateral rib fxs after a fall 5/17. Imaging also showed stable subacute fractures involving the right sacral ala, right superior and inferior rami, and left parasymphyseal region along with stable superior endplate compression deformities at T3, T4, and T5 since 01/21/2024. PMH includes ETOH abuse s/p two liver transplants, 5/17 fall with R sacral ala superior and inferior rami fx, L superior endplate compression deformities at T3 T4 T5   OT comments  Pt progressing toward established OT goals. Challenging attention, strength, balance, vision, and activity tolerance with ADL tasks. Pt grossly needing CGA for OOB mobility and ADL as well as up to min cues for scanning to locate grooming items and toilet paper during session. Pt HR up to 128 with mobility. Will continue to follow. Per chart, does not have support to go to intensive rehabilitation, so updated to inpatient rehab <3 hours/day.       If plan is discharge home, recommend the following:  A little help with walking and/or transfers;A little help with bathing/dressing/bathroom;Assistance with cooking/housework;Direct supervision/assist for medications management;Direct supervision/assist for financial management;Help with stairs or ramp for entrance;Assist for transportation   Equipment Recommendations  BSC/3in1    Recommendations for Other Services      Precautions / Restrictions Precautions Precautions: Fall;Other (comment) Recall of Precautions/Restrictions: Impaired Precaution/Restrictions Comments: cortrak, watch HR Restrictions Weight Bearing Restrictions Per  Provider Order: No       Mobility Bed Mobility Overal bed mobility: Needs Assistance Bed Mobility: Supine to Sit, Sit to Supine     Supine to sit: Supervision Sit to supine: Supervision        Transfers Overall transfer level: Needs assistance Equipment used: Rolling walker (2 wheels) Transfers: Sit to/from Stand Sit to Stand: Contact guard assist                 Balance Overall balance assessment: Needs assistance Sitting-balance support: No upper extremity supported, Feet supported Sitting balance-Leahy Scale: Good     Standing balance support: Bilateral upper extremity supported Standing balance-Leahy Scale: Poor                             ADL either performed or assessed with clinical judgement   ADL Overall ADL's : Needs assistance/impaired     Grooming: Standing;Contact guard assist Grooming Details (indicate cue type and reason): for balance                 Toilet Transfer: Contact guard assist;Ambulation;Rolling walker (2 wheels)   Toileting- Clothing Manipulation and Hygiene: Supervision/safety;Sitting/lateral lean       Functional mobility during ADLs: Contact guard assist;Rolling walker (2 wheels)      Extremity/Trunk Assessment Upper Extremity Assessment Upper Extremity Assessment: Generalized weakness   Lower Extremity Assessment Lower Extremity Assessment: Defer to PT evaluation        Vision   Vision Assessment?: Vision impaired- to be further tested in functional context;No apparent visual deficits Additional Comments: Needing increased time and cues to locate grooming items   Perception     Praxis     Communication Communication Communication: Impaired Factors Affecting Communication: Hearing impaired   Cognition Arousal: Alert Behavior  During Therapy: Flat affect Cognition: Cognition impaired     Awareness: Intellectual awareness impaired, Online awareness impaired Memory impairment (select all  impairments): Short-term memory, Working memory Attention impairment (select first level of impairment): Sustained attention Executive functioning impairment (select all impairments): Sequencing, Organization OT - Cognition Comments: follows one step commands, needing cues for multistep commands.               Rancho Mirant Scales of Cognitive Functioning: Automatic, Appropriate: Minimal Assistance for Daily Living Skills [VII] Following commands: Impaired Following commands impaired: Only follows one step commands consistently, Follows one step commands with increased time      Cueing   Cueing Techniques: Verbal cues, Tactile cues  Exercises      Shoulder Instructions       General Comments      Pertinent Vitals/ Pain       Pain Assessment Pain Assessment: No/denies pain  Home Living                                          Prior Functioning/Environment              Frequency  Min 2X/week        Progress Toward Goals  OT Goals(current goals can now be found in the care plan section)  Progress towards OT goals: Progressing toward goals  Acute Rehab OT Goals Patient Stated Goal: get better OT Goal Formulation: With patient Time For Goal Achievement: 02/18/24 Potential to Achieve Goals: Good ADL Goals Pt Will Perform Grooming: with set-up;sitting Pt Will Perform Upper Body Bathing: with set-up;sitting Pt Will Perform Lower Body Bathing: with min assist;sit to/from stand Pt Will Transfer to Toilet: with mod assist;stand pivot transfer;bedside commode Additional ADL Goal #1: pt will complete visual scanning task with <2 errors Additional ADL Goal #2: pt will demonstrates recall of information after 10 minutes  Plan      Co-evaluation                 AM-PAC OT 6 Clicks Daily Activity     Outcome Measure   Help from another person eating meals?: A Little Help from another person taking care of personal grooming?: A  Little Help from another person toileting, which includes using toliet, bedpan, or urinal?: A Little Help from another person bathing (including washing, rinsing, drying)?: A Lot Help from another person to put on and taking off regular upper body clothing?: A Little Help from another person to put on and taking off regular lower body clothing?: A Little 6 Click Score: 17    End of Session Equipment Utilized During Treatment: Gait belt;Rolling walker (2 wheels)  OT Visit Diagnosis: Unsteadiness on feet (R26.81)   Activity Tolerance Patient tolerated treatment well   Patient Left in bed;with bed alarm set;with call bell/phone within reach   Nurse Communication Mobility status        Time: 1610-9604 OT Time Calculation (min): 35 min  Charges: OT General Charges $OT Visit: 1 Visit OT Treatments $Self Care/Home Management : 23-37 mins  Karilyn Ouch, OTR/L [facility] Acute Rehabilitation Office: [phone]   Emery Hans 02/15/2024, 5:14 PM

## 2024-02-16 LAB — TYPE AND SCREEN
ABO/RH(D): O NEG
Antibody Screen: POSITIVE
Donor AG Type: NEGATIVE
Donor AG Type: NEGATIVE
PT AG Type: NEGATIVE
Unit division: 0
Unit division: 0

## 2024-02-16 LAB — RENAL FUNCTION PANEL
Albumin: 2.3 g/dL — ABNORMAL LOW (ref 3.5–5.0)
Anion gap: 10 (ref 5–15)
BUN: 67 mg/dL — ABNORMAL HIGH (ref 8–23)
CO2: 22 mmol/L (ref 22–32)
Calcium: 9.7 mg/dL (ref 8.9–10.3)
Chloride: 108 mmol/L (ref 98–111)
Creatinine, Ser: 1.96 mg/dL — ABNORMAL HIGH (ref 0.61–1.24)
GFR, Estimated: 36 mL/min — ABNORMAL LOW (ref >60)
Glucose, Bld: 110 mg/dL — ABNORMAL HIGH (ref 70–99)
Phosphorus: 3.7 mg/dL (ref 2.5–4.6)
Potassium: 4.2 mmol/L (ref 3.5–5.1)
Sodium: 140 mmol/L (ref 135–145)

## 2024-02-16 LAB — BASIC METABOLIC PANEL WITH GFR
Anion gap: 10 (ref 5–15)
BUN: 67 mg/dL — ABNORMAL HIGH (ref 8–23)
CO2: 22 mmol/L (ref 22–32)
Calcium: 9.8 mg/dL (ref 8.9–10.3)
Chloride: 107 mmol/L (ref 98–111)
Creatinine, Ser: 1.95 mg/dL — ABNORMAL HIGH (ref 0.61–1.24)
GFR, Estimated: 36 mL/min — ABNORMAL LOW (ref >60)
Glucose, Bld: 110 mg/dL — ABNORMAL HIGH (ref 70–99)
Potassium: 4.2 mmol/L (ref 3.5–5.1)
Sodium: 139 mmol/L (ref 135–145)

## 2024-02-16 LAB — BPAM RBC
Blood Product Expiration Date: 202507122359
Blood Product Expiration Date: 202507152359
ISSUE DATE / TIME: 202506110110
Unit Type and Rh: 9500
Unit Type and Rh: 9500

## 2024-02-16 LAB — GLUCOSE, CAPILLARY
Glucose-Capillary: 109 mg/dL — ABNORMAL HIGH (ref 70–99)
Glucose-Capillary: 107 mg/dL — ABNORMAL HIGH (ref 70–99)
Glucose-Capillary: 107 mg/dL — ABNORMAL HIGH (ref 70–99)
Glucose-Capillary: 95 mg/dL (ref 70–99)
Glucose-Capillary: 95 mg/dL (ref 70–99)
Glucose-Capillary: 121 mg/dL — ABNORMAL HIGH (ref 70–99)

## 2024-02-16 MED ORDER — FAMOTIDINE 20 MG PO TABS
20.0000 mg | ORAL_TABLET | Freq: Every day | ORAL | Status: DC
  Administered 2024-02-16 – 2024-02-19 (×4): 20 mg via ORAL
  Filled 2024-02-16 (×4): qty 1

## 2024-02-16 MED ORDER — TAMSULOSIN HCL 0.4 MG PO CAPS
0.4000 mg | ORAL_CAPSULE | Freq: Every day | ORAL | Status: DC
  Administered 2024-02-16 – 2024-03-06 (×20): 0.4 mg via ORAL
  Filled 2024-02-16 (×20): qty 1

## 2024-02-16 NOTE — Progress Notes (Addendum)
 Physical Therapy Treatment Patient Details Name: [name] MRN: [id number] DOB: August 23, 1951 Today's Date: 02/16/2024   History of Present Illness 73 yo male presenting to ED 5/28 after being found seizing after falling down stairs. CTH bil SDH and SAH with trace IVH and non-depressed occipital skull fx. Intubated 5/28, self-extubated 5/30. Recently sustained bilateral rib fxs after a fall 5/17. Imaging also showed stable subacute fractures involving the right sacral ala, right superior and inferior rami, and left parasymphyseal region along with stable superior endplate compression deformities at T3, T4, and T5 since 01/21/2024. PMH includes ETOH abuse s/p two liver transplants, 5/17 fall with R sacral ala superior and inferior rami fx, L superior endplate compression deformities at T3 T4 T5    PT Comments  Pt lacks motivation to participate; does not cite reason why he doesn't want to ambulate other than not feeling like it. Pt able to step pivot from bed to chair with RW and CGA. Performed serial sit to stands for LE strength and power. PT discussed how he has not demonstrated ability to ambulate a household distance. Pt responded, my house is only 500 sq ft and my girlfriend will help me. Will continue to follow acutely. Patient will benefit from continued inpatient follow up therapy, <3 hours/day.    If plan is discharge home, recommend the following: Assistance with cooking/housework;Assist for transportation;Help with stairs or ramp for entrance;Supervision due to cognitive status;A little help with walking and/or transfers   Can travel by private vehicle     Yes  Equipment Recommendations  Rolling walker (2 wheels);BSC/3in1;Wheelchair (measurements PT)    Recommendations for Other Services       Precautions / Restrictions Precautions Precautions: Fall;Other (comment) Recall of Precautions/Restrictions: Impaired Precaution/Restrictions Comments: cortrak Restrictions Weight  Bearing Restrictions Per Provider Order: No     Mobility  Bed Mobility Overal bed mobility: Needs Assistance Bed Mobility: Supine to Sit, Sit to Supine     Supine to sit: Supervision Sit to supine: Supervision        Transfers Overall transfer level: Needs assistance Equipment used: Rolling walker (2 wheels) Transfers: Sit to/from Stand Sit to Stand: Contact guard assist   Step pivot transfers: Contact guard assist       General transfer comment: Cues for reaching back for surface    Ambulation/Gait                   Stairs             Wheelchair Mobility     Tilt Bed    Modified Rankin (Stroke Patients Only)       Balance Overall balance assessment: Needs assistance Sitting-balance support: No upper extremity supported, Feet supported Sitting balance-Leahy Scale: Good     Standing balance support: Bilateral upper extremity supported Standing balance-Leahy Scale: Poor                              Communication Communication Communication: Impaired Factors Affecting Communication: Hearing impaired  Cognition Arousal: Alert Behavior During Therapy: Flat affect   PT - Cognitive impairments: Awareness, Memory, Attention, Initiation, Safety/Judgement                   Rancho Levels of Cognitive Functioning Rancho Los Amigos Scales of Cognitive Functioning: Automatic, Appropriate: Minimal Assistance for Daily Living Skills Rancho Los Amigos Scales of Cognitive Functioning: Automatic, Appropriate: Minimal Assistance for Daily Living Skills [VII]   Following commands: Impaired  Following commands impaired: Only follows one step commands consistently, Follows one step commands with increased time    Cueing Cueing Techniques: Verbal cues, Tactile cues  Exercises Other Exercises Other Exercises: x5 serial sit to stands    General Comments        Pertinent Vitals/Pain Pain Assessment Pain Assessment: No/denies pain     Home Living                          Prior Function            PT Goals (current goals can now be found in the care plan section) Acute Rehab PT Goals Time For Goal Achievement: 03/01/24 Potential to Achieve Goals: Fair    Frequency    Min 3X/week      PT Plan      Co-evaluation              AM-PAC PT 6 Clicks Mobility   Outcome Measure  Help needed turning from your back to your side while in a flat bed without using bedrails?: A Little Help needed moving from lying on your back to sitting on the side of a flat bed without using bedrails?: A Little Help needed moving to and from a bed to a chair (including a wheelchair)?: A Little Help needed standing up from a chair using your arms (e.g., wheelchair or bedside chair)?: A Little Help needed to walk in hospital room?: A Little Help needed climbing 3-5 steps with a railing? : A Lot 6 Click Score: 17    End of Session Equipment Utilized During Treatment: Gait belt Activity Tolerance: Patient tolerated treatment well Patient left: in bed;with call bell/phone within reach;with bed alarm set Nurse Communication: Mobility status PT Visit Diagnosis: Unsteadiness on feet (R26.81);Other abnormalities of gait and mobility (R26.89);Muscle weakness (generalized) (M62.81);History of falling (Z91.81);Difficulty in walking, not elsewhere classified (R26.2);Other symptoms and signs involving the nervous system (R29.898)     Time: 1355-1411 PT Time Calculation (min) (ACUTE ONLY): 16 min  Charges:    $Therapeutic Activity: 8-22 mins PT General Charges $$ ACUTE PT VISIT: 1 Visit                     Verdia Glad, PT, DPT Acute Rehabilitation Services Office [phone]    Claria Crofts 02/16/2024, 3:31 PM

## 2024-02-16 NOTE — Progress Notes (Signed)
 Speech Language Pathology Treatment: Dysphagia;Cognitive-Linguistic  Patient Details Name: [name] MRN: [id number] DOB: 10/25/1950 Today's Date: 02/16/2024 Time: 4098-1191 SLP Time Calculation (min) (ACUTE ONLY): 18 min  Assessment / Plan / Recommendation Clinical Impression  Pt is progressing with goals related to dysphagia and cognition. No s/s of aspiration were observed with consecutive straw sips of thin liquids. Intake remains limited but there have been no concerns per RN. Recommend he continue current diet of regular solids with thin liquids. Pills should be given crushed in puree or via Cortrak as able. SLP will f/u at least briefly to reinforce education.   He is now demonstrating behavior consistent with Rancho Level VII (automatic, appropriate). Pt able to accurately recall his age independently. He participated in an activity in which he was asked to identify errors in a pillbox with only Min verbal cueing required. While post-acute needs are not anticipated for swallowing, recommend ongoing f/u for cognitive goals at time of discharge.    HPI HPI: [name] is a 73 yo male presenting to ED 5/28 after being found seizing after falling down stairs. CTH shows bilateral SDH and SAH with trace IVH and non-depressed occipital skull fx. Intubated 5/28, self-extubated 5/30. Recently sustained bilateral rib fxs after a fall 5/17. MBS 5/30 showed moderate pharyngeal dysphagia which was thought to be partially chronic in nature. There was silent aspiration as pharyngeal residue progressed past the vocal folds. PMH includes ETOH abuse s/p two liver transplants      SLP Plan  Continue with current plan of care          Recommendations  Diet recommendations: Regular;Thin liquid Liquids provided via: Cup;Straw Medication Administration: Crushed with puree Supervision: Staff to assist with self feeding Compensations: Minimize environmental distractions;Slow rate;Small  sips/bites;Clear throat intermittently;Multiple dry swallows after each bite/sip Postural Changes and/or Swallow Maneuvers: Seated upright 90 degrees;Upright 30-60 min after meal                  Oral care BID   Frequent or constant Supervision/Assistance Dysphagia, pharyngoesophageal phase (R13.14);Cognitive communication deficit (Y78.295)     Continue with current plan of care     [name], M.A., CCC-SLP Speech Language Pathology, Acute Rehabilitation Services  Secure Chat preferred [phone]   02/16/2024, 4:04 PM

## 2024-02-16 NOTE — Plan of Care (Signed)

## 2024-02-16 NOTE — Progress Notes (Signed)
 Patient ID: [name], male   DOB: Jan 06, 1951, 73 y.o.   MRN: [id number]      Subjective: Does not remember all that he ate yesterday. He claims he wants to do all he can to go home. ROS negative except as listed above. Objective: Vital signs in last 24 hours: Temp:  [97.8 F (36.6 C)-98.2 F (36.8 C)] 97.8 F (36.6 C) (06/12 0800) Pulse Rate:  [82-119] 99 (06/12 0800) Resp:  [12-17] 14 (06/12 0800) BP: (137-167)/(76-120) 144/94 (06/12 0800) SpO2:  [83 %-95 %] 93 % (06/12 0800) Weight:  [61.4 kg] 61.4 kg (06/12 0500) Last BM Date : 02/14/24  Intake/Output from previous day: 06/11 0701 - 06/12 0700 In: 480 [P.O.:480] Out: 1250 [Urine:1250] Intake/Output this shift: No intake/output data recorded.  General appearance: alert and cooperative Resp: clear to auscultation bilaterally GI: soft, NT Neuro: alert, F/C x 4 ext, oriented x 4  Lab Results: CBC  Recent Labs    02/14/24 1006 02/15/24 0615  WBC 6.1  --   HGB 6.8* 8.5*  HCT 22.4* 26.6*  PLT 631*  --    BMET Recent Labs    02/16/24 0555 02/16/24 0557  NA 140 139  K 4.2 4.2  CL 108 107  CO2 22 22  GLUCOSE 110* 110*  BUN 67* 67*  CREATININE 1.96* 1.95*  CALCIUM 9.7 9.8   PT/INR No results for input(s): LABPROT, INR in the last 72 hours. ABG No results for input(s): PHART, HCO3 in the last 72 hours.  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 Rochelle Chu  Loaded with Keppra  in ED, completed 1 week BID keppra , F/U CT head slightly increased SDH but exam stable. TBI therapies.  Bilateral rib fractures that were sustained in a fall on 01/21/2024, similar on follow-up CT scan - multimodal pain control, pulm toilet,  Acute hypoxic respiratory  failure - self-extubated, weaned back to room air, continue flutter, chest PT, duonebs EtoH abuse and withdrawal - CIWA. MVI, thiamine , folic acid . No longer requiring dex or ativan . Tachycardia - improved on metoprolol  FEN: now on reg diet with thin liquids, encouraged PO, TF HS until can take in enough CKD/AKI - appreciate Nephrology F/U, CRT P today VTE: PAS, SQH Endo: TSH elevated, T3 P, T4 normal ID: None Foley: none Dispo: 4NP, therapies. I spoke with him at length and his MS is now clear. He does not want to go to SNF. He wants to go home with his GF. I will speak with her when she comes later today. Will focus on optimizing him for D/C home. I encouraged him to eat more.    LOS: 15 days    Dorena Gander, MD, MPH, FACS Trauma & General Surgery Use [email or web address] to contact on call provider  02/16/2024

## 2024-02-16 NOTE — Progress Notes (Addendum)
 [facility] Progress Note  Name: [name] MRN: [id number] DOB: 1951/02/09  Chief Complaint:  Found down after suspected fall by family  Subjective:  He had 1.3 liters UOP over 6/11 as well as two unmeasured urine voids.  Per surgery team he has declined SNF and is working on optimizing his nutrition for a planned discharge to home.  Has been getting nepro fees via cortrak      Review of systems:      Denies shortness of breath or chest pain  Denies n/v Not taking much PO  ----------------------------- Background:  The patient is a 73 y.o. year-old w/ PMH as below who presented after a fall w/ rib fractures, was found by family at the bottom of the stairs after another suspected fall. Pt was seizing. Pt intubated on arrival. Found to have SAH, skull fracture, bilat SDH's, TBI, bilat rib fractures. He suffered etoh withdrawal. He had acute hypoxic resp failure and was on the vent. His renal function has improved from admission, but now the BUN is going up to 100. We are asked to see for renal failure.    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 He did have some kidney problems post transplant and took HD for a while but is not getting it anymore. Pt had been drinking alcohol  again at home.   Pt seen in room. Pt states he is going back to [facility] sometime soon and doesn't need a kidney doctor here. He denies any SOB, cough, leg swelling. Voiding is normal.     From his other chart (now marked for merge) baseline Cr is mid 2's with prior AKI     Intake/Output Summary (Last 24 hours) at 02/16/2024 1332 Last data filed at 02/16/2024 1130 Gross per 24 hour  Intake 480 ml  Output 1750 ml  Net -1270 ml    Vitals:  Vitals:   02/16/24 0248 02/16/24 0500 02/16/24 0800 02/16/24 1135  BP: (!) 153/86  (!) 144/94 134/86  Pulse:   99 97  Resp:   14 14  Temp: 98.2 F (36.8 C)  97.8 F (36.6 C) 97.8 F  (36.6 C)  TempSrc: Axillary  Oral Oral  SpO2:   93% 93%  Weight:  61.4 kg    Height:         Physical Exam:      General adult male in bed in no acute distress HEENT normocephalic atraumatic extraocular movements intact sclera anicteric; cortrak in place Neck supple trachea midline Lungs clear to auscultation bilaterally normal work of breathing at rest on room air; wet cough  Heart S1S2 no rub; tachy Abdomen soft nontender nondistended Extremities no edema  Psych normal mood and affect Neuro alert and oriented x 3provides hx and follows commands  Medications reviewed   Labs:     Latest Ref Rng & Units 02/16/2024    5:57 AM 02/16/2024    5:55 AM 02/15/2024    6:15 AM  BMP  Glucose 70 - 99 mg/dL 416  606  301   BUN 8 - 23 mg/dL 67  67  66   Creatinine 0.61 - 1.24 mg/dL 6.01  0.93  2.35   Sodium 135 - 145 mmol/L 139  140  139   Potassium 3.5 - 5.1 mmol/L 4.2  4.2  4.1   Chloride 98 - 111 mmol/L 107  108  109   CO2 22 - 32 mmol/L  22  22  20    Calcium 8.9 - 10.3 mg/dL 9.8  9.7  8.9      Assessment/Plan:    AKI May be secondary to pre-renal insults.  On admission creat was 3.4 and has now leveled off down around the low 2's, which appears to be his baseline He is s/p hydration.  Hold off on additional fluids today  Renal panel daily while here  Nutrition per primary team   CKD stage IV - baseline Cr is in the mid 2's.  We have encouraged him to establish care here as [place] currently - he does agree; we will set him up for follow-up with [facility] on discharge   Hyperkalemia: secondary to AKI and dietary K intake.   Improved s/p lokelma  and normal saline Will need low K diet when diet advanced  I discussed this with the patient and his girlfriend.  On nepro for feeds  Liver transplant: on prograf  - per primary team  Fall with rib fractures/ debility: per primary team.    Disposition - per primary team discretion.  I have requested  outpatient follow-up with [facility] in 2-3 weeks.  I am concerned about his poor insight and risk for readmission.  Note that his sister has been asset via phone to help with coordinating his care   Nan Aver, MD 02/16/2024 1:52 PM    Note elevated post-void residuals and not meeting criteria for in/out cath.  Start flomax for now   Nan Aver, MD 3:00 PM 02/16/2024

## 2024-02-17 LAB — RENAL FUNCTION PANEL
Albumin: 2.3 g/dL — ABNORMAL LOW (ref 3.5–5.0)
Anion gap: 11 (ref 5–15)
BUN: 68 mg/dL — ABNORMAL HIGH (ref 8–23)
CO2: 21 mmol/L — ABNORMAL LOW (ref 22–32)
Calcium: 9.7 mg/dL (ref 8.9–10.3)
Chloride: 104 mmol/L (ref 98–111)
Creatinine, Ser: 2 mg/dL — ABNORMAL HIGH (ref 0.61–1.24)
GFR, Estimated: 35 mL/min — ABNORMAL LOW (ref >60)
Glucose, Bld: 103 mg/dL — ABNORMAL HIGH (ref 70–99)
Phosphorus: 3.5 mg/dL (ref 2.5–4.6)
Potassium: 4.2 mmol/L (ref 3.5–5.1)
Sodium: 136 mmol/L (ref 135–145)

## 2024-02-17 LAB — GLUCOSE, CAPILLARY
Glucose-Capillary: 102 mg/dL — ABNORMAL HIGH (ref 70–99)
Glucose-Capillary: 121 mg/dL — ABNORMAL HIGH (ref 70–99)
Glucose-Capillary: 102 mg/dL — ABNORMAL HIGH (ref 70–99)
Glucose-Capillary: 96 mg/dL (ref 70–99)
Glucose-Capillary: 111 mg/dL — ABNORMAL HIGH (ref 70–99)
Glucose-Capillary: 118 mg/dL — ABNORMAL HIGH (ref 70–99)

## 2024-02-17 MED ORDER — ENSURE PLUS HIGH PROTEIN PO LIQD
237.0000 mL | Freq: Two times a day (BID) | ORAL | Status: DC
  Administered 2024-02-18 – 2024-02-23 (×7): 237 mL via ORAL

## 2024-02-17 NOTE — Progress Notes (Addendum)
 [facility] Progress Note  Name: [name] MRN: [id number] DOB: July 05, 1951  Chief Complaint:  Found down after suspected fall by family  Subjective:  He was started on flomax  on 6/12 for elevated post-void residuals not meeting criteria for in/out cath.  Had 1.4 liters UOP over 6/12 as well as one unmeasured urine void.  Per surgery team he has declined SNF and is working on optimizing his nutrition.  Has been getting nepro feeds via cortrak.  He doesn't like the taste of nepro but can drink ensure so when he is actually drinking supplements it's fine to use ensure - discussed with RD.  He's working on eating more - doing a little better.  We discussed the importance of nutrition as it relates to staying out of the hospital.       Review of systems:       Denies shortness of breath or chest pain; cough is better Denies n/v Not taking much PO  ----------------------------- Background:  The patient is a 73 y.o. year-old w/ PMH as below who presented after a fall w/ rib fractures, was found by family at the bottom of the stairs after another suspected fall. Pt was seizing. Pt intubated on arrival. Found to have SAH, skull fracture, bilat SDH's, TBI, bilat rib fractures. He suffered etoh withdrawal. He had acute hypoxic resp failure and was on the vent. His renal function has improved from admission, but now the BUN is going up to 100. We are asked to see for renal failure.    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 He did have some kidney problems post transplant and took HD for a while but is not getting it anymore. Pt had been drinking alcohol  again at home.   Pt seen in room. Pt states he is going back to [facility] sometime soon and doesn't need a kidney doctor here. He denies any SOB, cough, leg swelling. Voiding is normal.     From his other chart (now marked for merge) baseline Cr is mid 2's with prior  AKI     Intake/Output Summary (Last 24 hours) at 02/17/2024 0954 Last data filed at 02/16/2024 2330 Gross per 24 hour  Intake --  Output 1350 ml  Net -1350 ml    Vitals:  Vitals:   02/16/24 2311 02/17/24 0359 02/17/24 0419 02/17/24 0800  BP: 136/78 (!) 134/99  (!) 126/90  Pulse: 100   (!) 111  Resp: 20   15  Temp: 97.7 F (36.5 C) 98 F (36.7 C)  97.9 F (36.6 C)  TempSrc: Axillary Oral  Oral  SpO2: 100%   94%  Weight:   59.3 kg   Height:         Physical Exam:       General adult male in bed in no acute distress HEENT normocephalic atraumatic extraocular movements intact sclera anicteric; cortrak in place Neck supple trachea midline Lungs clear to auscultation bilaterally normal work of breathing at rest on room air Heart S1S2 no rub; tachy Abdomen soft nontender nondistended Extremities no edema  Psych normal mood and affect Neuro alert and oriented x 3provides hx and follows commands  Medications reviewed   Labs:     Latest Ref Rng & Units 02/17/2024    6:55 AM 02/16/2024    5:57 AM 02/16/2024    5:55 AM  BMP  Glucose 70 - 99 mg/dL 130  865  110   BUN 8 - 23 mg/dL 68  67  67   Creatinine 0.61 - 1.24 mg/dL 1.61  0.96  0.45   Sodium 135 - 145 mmol/L 136  139  140   Potassium 3.5 - 5.1 mmol/L 4.2  4.2  4.2   Chloride 98 - 111 mmol/L 104  107  108   CO2 22 - 32 mmol/L 21  22  22    Calcium 8.9 - 10.3 mg/dL 9.7  9.8  9.7      Assessment/Plan:    AKI May be secondary to pre-renal insults.  On admission creat was 3.4 and has now leveled off down around the low 2's, which appears to be his baseline He is s/p hydration.  Hold off on additional fluids today (wet cough)   Renal panel daily while here  Nutrition per primary team   CKD stage IV - baseline Cr is in the mid 2's.  We have encouraged him to establish care here as [place] of [place] currently - he does agree; we will set him up for follow-up with [facility] on discharge    Hyperkalemia: secondary to AKI and dietary K intake.   resolved s/p lokelma  and normal saline Will need low K diet when diet advanced  I discussed this with the patient and his girlfriend.  On nepro for feeds  Liver transplant: on prograf  - per primary team  Fall with rib fractures/ debility: per primary team.   His AKI has resolved to baseline.  Nephrology will sign off.    Disposition - per primary team discretion.  I have requested outpatient follow-up with [facility] in 2-3 weeks.  I am concerned about his poor insight and risk for readmission.  Note that his sister has been asset via phone to help with coordinating his care.    Nan Aver, MD 02/17/2024 10:11 AM  Addendum - urinary retention - on flomax  now.  Reassess outpatient   Nan Aver, MD 10:14 AM 02/17/2024

## 2024-02-17 NOTE — Progress Notes (Addendum)
 Occupational Therapy Treatment Patient Details Name: [name] MRN: [id number] DOB: [birth year] Today's Date: 02/17/2024   History of present illness 73 yo male presenting to ED 5/28 after being found seizing after falling down stairs. CTH bil SDH and SAH with trace IVH and non-depressed occipital skull fx. Intubated 5/28, self-extubated 5/30. Recently sustained bilateral rib fxs after a fall 5/17. Imaging also showed stable subacute fractures involving the right sacral ala, right superior and inferior rami, and left parasymphyseal region along with stable superior endplate compression deformities at T3, T4, and T5 since 01/21/2024. PMH includes ETOH abuse s/p two liver transplants, 5/17 fall with R sacral ala superior and inferior rami fx, L superior endplate compression deformities at T3 T4 T5   OT comments  Pt. Seen for skilled OT treatment session.  Able to complete bed mobility with S and cues.  LB dressing task seated eob with set up/seated.  Short distance ambulation in room from eob to recliner with CGA.  Pt. Stating over and over he just wants to go home.   Encouragement to sit up in chair for recommended time and to eat breakfast with review that daily participation in skilled therapies/ADLS will aide in his goals to get back home.   Cont. With acute OT POC.        If plan is discharge home, recommend the following:  A little help with walking and/or transfers;A little help with bathing/dressing/bathroom;Assistance with cooking/housework;Direct supervision/assist for medications management;Direct supervision/assist for financial management;Help with stairs or ramp for entrance;Assist for transportation   Equipment Recommendations  BSC/3in1    Recommendations for Other Services      Precautions / Restrictions Precautions Precautions: Fall;Other (comment) Recall of Precautions/Restrictions: Impaired Precaution/Restrictions Comments: cortrak       Mobility Bed  Mobility Overal bed mobility: Needs Assistance Bed Mobility: Supine to Sit     Supine to sit: Supervision     General bed mobility comments: cues to scoot eob, increased time but no physical assistance required    Transfers                         Balance                                           ADL either performed or assessed with clinical judgement   ADL Overall ADL's : Needs assistance/impaired   Eating/Feeding Details (indicate cue type and reason): max encouragement to eat breakfast, but states he just woke up and is not hungry right now but will try in a little while                 Lower Body Dressing: Set up;Sitting/lateral leans Lower Body Dressing Details (indicate cue type and reason): able to figure four BLEs for donning/management of socks Toilet Transfer: Contact guard assist;Ambulation;Rolling walker (2 wheels) Toilet Transfer Details (indicate cue type and reason): eob with steps to recliner         Functional mobility during ADLs: Contact guard assist;Rolling walker (2 wheels) General ADL Comments: cues for hand placment during pivot before sitting down    Extremity/Trunk Assessment              Vision       Perception     Praxis     Communication Communication Communication: Impaired Factors Affecting Communication: Hearing impaired   Cognition  Arousal: Alert Behavior During Therapy: Flat affect Cognition: Cognition impaired     Awareness: Intellectual awareness impaired, Online awareness impaired Memory impairment (select all impairments): Short-term memory, Working memory Attention impairment (select first level of impairment): Sustained attention Executive functioning impairment (select all impairments): Sequencing, Organization OT - Cognition Comments: follows one step commands, needing cues for multistep commands. Rn states its 9:00 I'm gonna need you to stay up until 11:00 okay 2 hours..  rn  leaves the room and pt. Looks at me and says how long do I have to stay up I said he just told you try and remember, what time did he say pt. States I dont know, 10.  I said actually 37.  Pt. Said well then can I have my feet up (he had just declined them being up and had wanted them down).                   Following commands: Impaired Following commands impaired: Only follows one step commands consistently, Follows one step commands with increased time      Cueing      Exercises      Shoulder Instructions       General Comments      Pertinent Vitals/ Pain       Pain Assessment Pain Assessment: No/denies pain  Home Living                                          Prior Functioning/Environment              Frequency  Min 2X/week        Progress Toward Goals  OT Goals(current goals can now be found in the care plan section)  Progress towards OT goals: Progressing toward goals     Plan      Co-evaluation                 AM-PAC OT 6 Clicks Daily Activity     Outcome Measure   Help from another person eating meals?: A Little Help from another person taking care of personal grooming?: A Little Help from another person toileting, which includes using toliet, bedpan, or urinal?: A Little Help from another person bathing (including washing, rinsing, drying)?: A Lot Help from another person to put on and taking off regular upper body clothing?: A Little Help from another person to put on and taking off regular lower body clothing?: A Little 6 Click Score: 17    End of Session Equipment Utilized During Treatment: Rolling walker (2 wheels)  OT Visit Diagnosis: Unsteadiness on feet (R26.81)   Activity Tolerance Patient tolerated treatment well   Patient Left in chair;with call bell/phone within reach;with chair alarm set   Nurse Communication Mobility status;Other (comment) (reviewed pt. up in chair with chair alarm set  and call bell in reach)        Time: 4098-1191 OT Time Calculation (min): 13 min  Charges: OT General Charges $OT Visit: 1 Visit OT Treatments $Self Care/Home Management : 8-22 mins  Howell Macintosh, COTA/L Acute Rehabilitation [phone]   Leory Rands [place]-COTA/L  02/17/2024, 8:59 AM

## 2024-02-17 NOTE — Progress Notes (Addendum)
 Subjective: CC: Reports HA. Pain is well controlled. No other complaints. Tolerating diet but not eating much. No n/v. Reports BM today. Voiding. Oob to chair with therapies this am.   Afebrile. Tachy to 111 this am. No hypotension. On RA. Just got his PO Metoprolol  recently.   Objective: Vital signs in last 24 hours: Temp:  [97.7 F (36.5 C)-98 F (36.7 C)] 97.9 F (36.6 C) (06/13 0800) Pulse Rate:  [97-111] 111 (06/13 0800) Resp:  [14-20] 15 (06/13 0800) BP: (126-152)/(78-99) 126/90 (06/13 0800) SpO2:  [93 %-100 %] 94 % (06/13 0800) Weight:  [59.3 kg] 59.3 kg (06/13 0419) Last BM Date : 02/14/24  Intake/Output from previous day: 06/12 0701 - 06/13 0700 In: -  Out: 1350 [Urine:1350] Intake/Output this shift: No intake/output data recorded.  PE: Gen:  Alert, NAD, pleasant HEENT: EOM's intact, pupils equal and round Card:  Tachycardic  Pulm:  Rate and effort normal Abd: Soft, ND, NT Ext:  No LE edema or calf tenderness Psych: A&Ox4 Neuro: CN 3-12 grossly intact, MAE's, f/c, non-focal  Lab Results:  Recent Labs    02/14/24 1006 02/15/24 0615  WBC 6.1  --   HGB 6.8* 8.5*  HCT 22.4* 26.6*  PLT 631*  --    BMET Recent Labs    02/16/24 0557 02/17/24 0655  NA 139 136  K 4.2 4.2  CL 107 104  CO2 22 21*  GLUCOSE 110* 103*  BUN 67* 68*  CREATININE 1.95* 2.00*  CALCIUM 9.8 9.7   PT/INR No results for input(s): LABPROT, INR in the last 72 hours. CMP     Component Value Date/Time   NA 136 02/17/2024 0655   K 4.2 02/17/2024 0655   CL 104 02/17/2024 0655   CO2 21 (L) 02/17/2024 0655   GLUCOSE 103 (H) 02/17/2024 0655   BUN 68 (H) 02/17/2024 0655   CREATININE 2.00 (H) 02/17/2024 0655   CALCIUM 9.7 02/17/2024 0655   PROT 5.8 (L) 02/13/2024 0531   ALBUMIN 2.3 (L) 02/17/2024 0655   AST 24 02/13/2024 0531   ALT 24 02/13/2024 0531   ALKPHOS 133 (H) 02/13/2024 0531   BILITOT 0.3 02/13/2024 0531   GFRNONAA 35 (L) 02/17/2024 0655   Lipase  No  results found for: LIPASE  Studies/Results: DG Swallowing Func-Speech Pathology Result Date: 02/15/2024 Table formatting from the original result was not included. Modified Barium Swallow Study Patient Details Name: [name] MRN: [id number] Date of Birth: March 12, 1951 Today's Date: 02/15/2024 HPI/PMH: HPI: [name] is a 73 yo male presenting to ED 5/28 after being found seizing after falling down stairs. CTH shows bilateral SDH and SAH with trace IVH and non-depressed occipital skull fx. Intubated 5/28, self-extubated 5/30. Recently sustained bilateral rib fxs after a fall 5/17. PMH includes ETOH abuse s/p two liver transplants Clinical Impression: Clinical Impression: Pt presents with improved swallow function compared to [facility] 5/30, now exhibiting mild pharyngeal dysphagia. Epiglottic inversion is now complete, allowing him to better protect his airway both during and after the swallow. While there is still a collection of pharyngeal residue, it is now less than half of each bolus. All phases of the swallow were increasingly disorganized when given the 13 mm barium tablet with thin liquids. He initially protected his airway well despite tilting his head posteriorly as the tablet lodged in the valleculae. He coughed (suspect secondary to globus sensation), resulting in retrograde bolus flow up to the level of the valleculae, which dislodged the tablet but  did result in sensed aspiration (PAS 7). Challenged pt with sequential sips of large bolus sizes with no repeated penetration/aspiration. Recommend upgrading to regular solids and thin liquids but pills should be given primarily crushed in puree (can give essential uncrushable meds whole with puree if small in size). SLP will f/u at least briefly to provide education but do not anticipate post-acute needs for dysphagia. Factors that may increase risk of adverse event in presence of aspiration [name] & [name] 2021): Factors that may increase risk  of adverse event in presence of aspiration [name] & [name] 2021): Reduced cognitive function; Limited mobility; Presence of tubes (ETT, trach, NG, etc.) Recommendations/Plan: Swallowing Evaluation Recommendations Swallowing Evaluation Recommendations Recommendations: PO diet PO Diet Recommendation: Regular; Thin liquids (Level 0) Liquid Administration via: Cup; Straw Medication Administration: Crushed with puree Supervision: Staff to assist with self-feeding; Full supervision/cueing for swallowing strategies Swallowing strategies  : Minimize environmental distractions; Slow rate; Small bites/sips; Multiple dry swallows after each bite/sip Postural changes: Position pt fully upright for meals; Stay upright 30-60 min after meals Oral care recommendations: Oral care BID (2x/day) Treatment Plan Treatment Plan Treatment recommendations: Therapy as outlined in treatment plan below Follow-up recommendations: Skilled nursing-short term rehab (<3 hours/day) Functional status assessment: Patient has had a recent decline in their functional status and demonstrates the ability to make significant improvements in function in a reasonable and predictable amount of time. Treatment frequency: Min 2x/week Treatment duration: 1 week Interventions: Aspiration precaution training; Patient/family education; Trials of upgraded texture/liquids; Diet toleration management by SLP Recommendations Recommendations for follow up therapy are one component of a multi-disciplinary discharge planning process, led by the attending physician.  Recommendations may be updated based on patient status, additional functional criteria and insurance authorization. Assessment: Orofacial Exam: Orofacial Exam Oral Cavity: Oral Hygiene: WFL Oral Cavity - Dentition: Missing dentition; Poor condition Orofacial Anatomy: WFL Oral Motor/Sensory Function: WFL Anatomy: Anatomy: Suspected cervical osteophytes Boluses Administered: Boluses Administered Boluses  Administered: Thin liquids (Level 0); Mildly thick liquids (Level 2, nectar thick); Moderately thick liquids (Level 3, honey thick); Puree; Solid  Oral Impairment Domain: Oral Impairment Domain Lip Closure: No labial escape Tongue control during bolus hold: Cohesive bolus between tongue to palatal seal Bolus preparation/mastication: Timely and efficient chewing and mashing Bolus transport/lingual motion: Brisk tongue motion Oral residue: Complete oral clearance Location of oral residue : N/A Initiation of pharyngeal swallow : Pyriform sinuses  Pharyngeal Impairment Domain: Pharyngeal Impairment Domain Soft palate elevation: No bolus between soft palate (SP)/pharyngeal wall (PW) Laryngeal elevation: Partial superior movement of thyroid cartilage/partial approximation of arytenoids to epiglottic petiole Anterior hyoid excursion: Partial anterior movement Epiglottic movement: Complete inversion Laryngeal vestibule closure: Complete, no air/contrast in laryngeal vestibule Pharyngeal stripping wave : Present - diminished Pharyngeal contraction (A/P view only): N/A Pharyngoesophageal segment opening: Partial distention/partial duration, partial obstruction of flow Tongue base retraction: Trace column of contrast or air between tongue base and PPW Pharyngeal residue: Collection of residue within or on pharyngeal structures Location of pharyngeal residue: Valleculae; Pyriform sinuses  Esophageal Impairment Domain: Esophageal Impairment Domain Esophageal clearance upright position: Esophageal retention with retrograde flow through the PES Pill: Pill Consistency administered: Thin liquids (Level 0) Thin liquids (Level 0): Impaired (see clinical impressions) Penetration/Aspiration Scale Score: Penetration/Aspiration Scale Score 1.  Material does not enter airway: Mildly thick liquids (Level 2, nectar thick); Moderately thick liquids (Level 3, honey thick); Puree; Solid; Pill 7.  Material enters airway, passes BELOW cords and  not ejected out despite cough attempt by patient: Thin  liquids (Level 0) Compensatory Strategies: Compensatory Strategies Compensatory strategies: Yes Straw: Effective Effective Straw: Thin liquid (Level 0); Mildly thick liquid (Level 2, nectar thick)   General Information: Caregiver present: No  Diet Prior to this Study: Dysphagia 2 (finely chopped); Mildly thick liquids (Level 2, nectar thick); Cortrak/Small bore NG tube   Temperature : Normal   Respiratory Status: WFL   Supplemental O2: None (Room air)   History of Recent Intubation: Yes  Behavior/Cognition: Alert; Cooperative; Requires cueing Self-Feeding Abilities: Needs assist with self-feeding Baseline vocal quality/speech: Normal Volitional Cough: Able to elicit Volitional Swallow: Able to elicit Exam Limitations: No limitations Goal Planning: Prognosis for improved oropharyngeal function: Good Barriers to Reach Goals: Cognitive deficits; Time post onset No data recorded Patient/Family Stated Goal: none stated Consulted and agree with results and recommendations: Patient Pain: Pain Assessment Pain Assessment: No/denies pain Faces Pain Scale: 4 Pain Location: LLE with repositioning Pain Descriptors / Indicators: Discomfort; Grimacing Pain Intervention(s): Monitored during session End of Session: Start Time:SLP Start Time (ACUTE ONLY): 1229 Stop Time: SLP Stop Time (ACUTE ONLY): 1254 Time Calculation:SLP Time Calculation (min) (ACUTE ONLY): 25 min Charges: SLP Evaluations $ SLP Speech Visit: 1 Visit SLP Evaluations $MBS Swallow: 1 Procedure $Swallowing Treatment: 1 Procedure $Speech Treatment for Individual: 1 Procedure SLP visit diagnosis: SLP Visit Diagnosis: Dysphagia, pharyngoesophageal phase (R13.14) Past Medical History: No past medical history on file. 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 Rochelle Chu  Loaded with Keppra  in ED, completed 1 week BID keppra , F/U CT head slightly increased SDH but exam stable. TBI therapies.  Bilateral rib fractures that were sustained in a fall on 01/21/2024, similar on follow-up CT scan - multimodal pain control, pulm toilet,  Acute hypoxic respiratory failure - self-extubated, weaned back to room air, continue flutter, chest PT, duonebs EtoH abuse and withdrawal - CIWA. MVI, thiamine , folic acid . No longer requiring dex or ativan . Tachycardia - Cards has seen and signed off. Cont metoprolol .  FEN: now on reg diet with thin liquids, encouraged PO, TF HS until can take in enough CKD/AKI - appreciate Nephrology F/U, Cr stable at 2 today VTE: PAS, SQH Endo: TSH elevated, T3 P, T4 normal ID: None Foley: none Dispo: 4NP, therapies. I again spoke with him at length and his MS is now clear. He does not want to go to SNF. He wants to go home with his GF. I tried to call her x 2 without answer. I will speak with her when she comes later today. Will focus on optimizing him for D/C home. I encouraged him to eat more.  I reviewed nursing notes, Consultant (nephrology) notes, last 24 h vitals and pain scores, last 48 h intake and output, last 24 h labs and trends, and last 24 h imaging results.    LOS: 16 days    Delton Filbert, [facility] Surgery 02/17/2024, 9:59 AM Please see Amion for pager number during day hours 7:00am-4:30pm

## 2024-02-17 NOTE — Plan of Care (Signed)

## 2024-02-17 NOTE — TOC Progression Note (Signed)
 Transition of Care [facility]) - Progression Note    Patient Details  Name: [name] MRN: [id number] Date of Birth: 1951-01-10  [facility] CM/SW Contact  Amarianna Abplanalp E Kadarrius Yanke, LCSW Phone Number: 02/17/2024, 10:48 AM  Clinical Narrative:    Per trauma rounds and chart review, patient prefers to go home with his significant other. PA and MD attempting to speak with significant other about patient's medical needs if he goes home. TOC will continue to follow. Patient has an out of state Medicare plan (called [facility]), so obtaining HH coverage may be a barrier.    Expected Discharge Plan: Skilled Nursing Facility Barriers to Discharge: Continued Medical Work up  Expected Discharge Plan and Services In-house Referral: Clinical Social Work     Living arrangements for the past 2 months: Single Family Home                                       Social Determinants of Health (SDOH) Interventions SDOH Screenings   Food Insecurity: No Food Insecurity (02/10/2024)  Housing: Low Risk  (02/10/2024)  Transportation Needs: No Transportation Needs (02/10/2024)  Utilities: Not At Risk (02/10/2024)  Social Connections: Moderately Integrated (02/10/2024)    Readmission Risk Interventions     No data to display

## 2024-02-17 NOTE — Progress Notes (Addendum)
 Nutrition Follow-up  DOCUMENTATION CODES:   Severe malnutrition in context of chronic illness  INTERVENTION:  Continue current diet as ordered per SLP (regular diet, thin liquids) Encourage PO intake Room service with assist  Continue nocturnal feedings via Cortrak tube: (1800-0600) Nepro Carb Steady @ 83 ml/hr x 12 hours (996 ml per day) Prosource TF20 1x/d 150 ml FWF Q6H - FWF ok per Nephrology  Provides 1,843 kcal (100% calorie needs), 100 gm protein (100% protein needs), 698 ml water  (1,298 ml water  daily TF + FWF)  Continue: 100 mg thiamine , MVI with minerals, and 1 mg folic acid  daily Daily weights Continue Mighty Shake TID with meals, each supplement provides 330 kcals and 9 grams of protein Ensure Plus High Protein po BID, each supplement provides 350 kcal and 20 grams of protein Discontinue Prosource Plus and Boost Breeze d/t pt's preference   Pt has refused Ensures, Nepro shakes, and magic cups.  Recommend PEG tube placement if within GOC.  NUTRITION DIAGNOSIS:   Severe Malnutrition related to chronic illness as evidenced by severe fat depletion, severe muscle depletion. - Still applicable   GOAL:   Patient will meet greater than or equal to 90% of their needs - Meeting via TF's  MONITOR:   TF tolerance  REASON FOR ASSESSMENT:   Consult Enteral/tube feeding initiation and management (nocturnal TF)  ASSESSMENT:   Pt with PMH of ETOH abuse s/p 2 liver transplants, CKD 3b, admitted after fall, found at the bottom of the stairs, admitted with TBI/bilateral SDH, anterior SAH, skull fx, and bilateral rib fxs (from 5/17 fall).  5/30- cortrak placed (gastric), s/p MBSS- dysphagia 2 diet with nectar thick liquids 5/31- s/p BSE- dysphagia 2 diet with nectar thick liquids, transitioned to nocturnal feeds 6/4 - transitioned back to continuous feeds as pt with very poor PO intake 6/7 - Transitioned to Nocturnal tube feeds  6/8 - Patient pulled out cortrak  6/7:  Nocturnal tube feeds started to meet 80% of needs 6/9 - Cortrak replaced 6/10 - Nocturnal tube feeds increased to meet 100% of needs due to poor PO intake   Pt resting in chair, still endorses poor appetite and PO intake. Had a Mighty shake for breakfast this morning. Refusing Boost Breeze and Nepro shakes. 0% meals documented per nursing. Pt says he will drink chocolate Ensure. Of note pt was on Ensures before and was refusing them. Nephrology ok with using Ensures. RD and trauma PA discussed possibility of a PEG. Pt refuses PEG placement as well as SNF, wants to go home.  Tolerating tube feeds at goal. No nausea vomiting noted. Having BM. Ongoing GOC with pt and GF in regards to PEG palcment and disposition.   Admit weight: 62 kg Current weight: 59.3 kg   Average Meal Intake: 6/2: 25% x 1 meal 6/6: 0% x 2 meal 6/9: 15% x 3 meals 6/12: 0% x 1 meal  Nutritionally Relevant Medications: Scheduled Meds:  (feeding supplement) PROSource Plus  30 mL Oral BID BM   ascorbic acid   500 mg Oral TID   feeding supplement  237 mL Oral BID BM   feeding supplement (NEPRO CARB STEADY)  1,000 mL Per Tube Q24H   feeding supplement (PROSource TF20)  60 mL Per Tube Daily   ferrous sulfate   325 mg Oral TID WC   folic acid   1 mg Per Tube Daily   free water   150 mL Per Tube Q6H   metoprolol  tartrate  12.5 mg Per Tube BID   multivitamin  with minerals  1 tablet Per Tube Daily   thiamine   100 mg Per Tube Daily   Labs Reviewed: BUN 68 Creatinine 2.00 AP 133 Albumin 2.3 Total protein 5.8 GFR 35  CBG ranges from 103-110 mg/dL over the last 24 hours No A1C  Diet Order:   Diet Order             Diet regular Room service appropriate? Yes with Assist; Fluid consistency: Thin  Diet effective now                   EDUCATION NEEDS:   Not appropriate for education at this time  Skin:  Skin Assessment: Skin Integrity Issues: Skin Integrity Issues:: Other (Comment) Other: head laceration, skin  tear to lt lower arm  Last BM:  6/13 type 2  Height:   Ht Readings from Last 1 Encounters:  02/01/24 5' 8 (1.727 m)    Weight:   Wt Readings from Last 1 Encounters:  02/17/24 59.3 kg    Ideal Body Weight:  70 kg  BMI:  Body mass index is 19.88 kg/m.  Estimated Nutritional Needs:   Kcal:  1800-2000  Protein:  95-115 grams  Fluid:  > 1.8 L/day   Frederik Jansky, RD Registered Dietitian  See Amion for more information

## 2024-02-18 LAB — RENAL FUNCTION PANEL
Albumin: 2.2 g/dL — ABNORMAL LOW (ref 3.5–5.0)
Anion gap: 6 (ref 5–15)
BUN: 83 mg/dL — ABNORMAL HIGH (ref 8–23)
CO2: 23 mmol/L (ref 22–32)
Calcium: 9.6 mg/dL (ref 8.9–10.3)
Chloride: 105 mmol/L (ref 98–111)
Creatinine, Ser: 1.94 mg/dL — ABNORMAL HIGH (ref 0.61–1.24)
GFR, Estimated: 37 mL/min — ABNORMAL LOW (ref >60)
Glucose, Bld: 93 mg/dL (ref 70–99)
Phosphorus: 3.6 mg/dL (ref 2.5–4.6)
Potassium: 4.3 mmol/L (ref 3.5–5.1)
Sodium: 134 mmol/L — ABNORMAL LOW (ref 135–145)

## 2024-02-18 LAB — GLUCOSE, CAPILLARY
Glucose-Capillary: 94 mg/dL (ref 70–99)
Glucose-Capillary: 101 mg/dL — ABNORMAL HIGH (ref 70–99)
Glucose-Capillary: 104 mg/dL — ABNORMAL HIGH (ref 70–99)
Glucose-Capillary: 103 mg/dL — ABNORMAL HIGH (ref 70–99)
Glucose-Capillary: 97 mg/dL (ref 70–99)

## 2024-02-18 NOTE — Progress Notes (Signed)
 Subjective: CC: Reports HA that has been intermittent for a few days now. Pain appears to be controlled. No other complaints at this time. Tolerating diet, however is not eating much. No n/v. Reports BM today. Voiding. Patient states that he usually is OOB as much as possible.   Afebrile. HR 84 this am. No hypotension. On RA.    Objective: Vital signs in last 24 hours: Temp:  [97.6 F (36.4 C)-97.9 F (36.6 C)] 97.9 F (36.6 C) (06/14 0800) Pulse Rate:  [84-100] 84 (06/14 0800) Resp:  [13-20] 15 (06/14 0800) BP: (106-124)/(57-82) 119/81 (06/14 0800) SpO2:  [93 %-97 %] 95 % (06/14 0800) Weight:  [59.3 kg] 59.3 kg (06/14 0435) Last BM Date : 02/14/24  Intake/Output from previous day: 06/13 0701 - 06/14 0700 In: 240 [P.O.:240] Out: 850 [Urine:850] Intake/Output this shift: No intake/output data recorded.  PE: Gen:  Alert, NAD, pleasant HEENT: EOM's intact, pupils equal and round Card:  HR WNL  Pulm:  Rate and effort normal Abd: Soft, ND, NT Ext:  No LE edema or calf tenderness Psych: A&Ox4 Neuro: CN 3-12 grossly intact, MAE's, f/c, non-focal  Lab Results:  No results for input(s): WBC, HGB, HCT, PLT in the last 72 hours.  BMET Recent Labs    02/17/24 0655 02/18/24 0512  NA 136 134*  K 4.2 4.3  CL 104 105  CO2 21* 23  GLUCOSE 103* 93  BUN 68* 83*  CREATININE 2.00* 1.94*  CALCIUM 9.7 9.6   PT/INR No results for input(s): LABPROT, INR in the last 72 hours. CMP     Component Value Date/Time   NA 134 (L) 02/18/2024 0512   K 4.3 02/18/2024 0512   CL 105 02/18/2024 0512   CO2 23 02/18/2024 0512   GLUCOSE 93 02/18/2024 0512   BUN 83 (H) 02/18/2024 0512   CREATININE 1.94 (H) 02/18/2024 0512   CALCIUM 9.6 02/18/2024 0512   PROT 5.8 (L) 02/13/2024 0531   ALBUMIN 2.2 (L) 02/18/2024 0512   AST 24 02/13/2024 0531   ALT 24 02/13/2024 0531   ALKPHOS 133 (H) 02/13/2024 0531   BILITOT 0.3 02/13/2024 0531   GFRNONAA 37 (L) 02/18/2024 0512    Lipase  No results found for: LIPASE  Studies/Results: No results found.   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 Rochelle Chu  Loaded with Keppra  in ED, completed 1 week BID keppra , F/U CT head slightly increased SDH but exam stable. TBI therapies.  Bilateral rib fractures that were sustained in a fall on 01/21/2024, similar on follow-up CT scan - multimodal pain control, pulm toilet,  Acute hypoxic respiratory failure - self-extubated, weaned back to room air, continue flutter, chest PT, duonebs EtoH abuse and withdrawal - CIWA. MVI, thiamine , folic acid . No longer requiring dex or ativan . Tachycardia - Cards has seen and signed off. Cont metoprolol .  FEN: now on reg diet with thin liquids, encouraged PO, TF HS until can take in enough CKD/AKI - appreciate Nephrology F/U, Cr stable at 2 today VTE: PAS, SQH Endo: TSH elevated, T3 P, T4 normal ID: None Foley: none Dispo: 4NP, therapies. He still does not want to go to SNF and continues to reiterate that he wants to go home with his GF. Plan to focus on optimizing him for D/C home. Goal is for him  to eat more.  I reviewed nursing notes, Consultant (nephrology) notes, last 24 h vitals and pain scores, last 48 h intake and output, last 24 h labs and trends, and last 24 h imaging results.    LOS: 17 days    Mirta Ammon, [facility] 02/18/2024, 10:13 AM Please see Amion for pager number during day hours 7:00am-4:30pm

## 2024-02-18 NOTE — Plan of Care (Signed)
  Problem: Education: Goal: Knowledge of General Education information will improve Description: Including pain rating scale, medication(s)/side effects and non-pharmacologic comfort measures 02/18/2024 0034 by Alejos Husband, RN Outcome: Progressing 02/17/2024 2337 by Alejos Husband, RN Outcome: Progressing   Problem: Health Behavior/Discharge Planning: Goal: Ability to manage health-related needs will improve 02/18/2024 0034 by Alejos Husband, RN Outcome: Progressing 02/17/2024 2337 by Alejos Husband, RN Outcome: Progressing   Problem: Clinical Measurements: Goal: Ability to maintain clinical measurements within normal limits will improve 02/18/2024 0034 by Alejos Husband, RN Outcome: Progressing 02/17/2024 2337 by Alejos Husband, RN Outcome: Progressing Goal: Will remain free from infection 02/18/2024 0034 by Alejos Husband, RN Outcome: Progressing 02/17/2024 2337 by Alejos Husband, RN Outcome: Progressing Goal: Diagnostic test results will improve 02/18/2024 0034 by Alejos Husband, RN Outcome: Progressing 02/17/2024 2337 by Alejos Husband, RN Outcome: Progressing Goal: Respiratory complications will improve 02/18/2024 0034 by Alejos Husband, RN Outcome: Progressing 02/17/2024 2337 by Alejos Husband, RN Outcome: Progressing Goal: Cardiovascular complication will be avoided 02/18/2024 0034 by Alejos Husband, RN Outcome: Progressing 02/17/2024 2337 by Alejos Husband, RN Outcome: Progressing   Problem: Activity: Goal: Risk for activity intolerance will decrease 02/18/2024 0034 by Alejos Husband, RN Outcome: Progressing 02/17/2024 2337 by Alejos Husband, RN Outcome: Progressing   Problem: Nutrition: Goal: Adequate nutrition will be maintained 02/18/2024 0034 by Alejos Husband, RN Outcome: Progressing 02/17/2024 2337 by Alejos Husband, RN Outcome: Progressing   Problem: Coping: Goal: Level of anxiety will  decrease 02/18/2024 0034 by Alejos Husband, RN Outcome: Progressing 02/17/2024 2337 by Alejos Husband, RN Outcome: Progressing   Problem: Elimination: Goal: Will not experience complications related to bowel motility 02/18/2024 0034 by Alejos Husband, RN Outcome: Progressing 02/17/2024 2337 by Alejos Husband, RN Outcome: Progressing Goal: Will not experience complications related to urinary retention 02/18/2024 0034 by Alejos Husband, RN Outcome: Progressing 02/17/2024 2337 by Alejos Husband, RN Outcome: Progressing   Problem: Pain Managment: Goal: General experience of comfort will improve and/or be controlled 02/18/2024 0034 by Alejos Husband, RN Outcome: Progressing 02/17/2024 2337 by Alejos Husband, RN Outcome: Progressing   Problem: Safety: Goal: Ability to remain free from injury will improve 02/18/2024 0034 by Alejos Husband, RN Outcome: Progressing 02/17/2024 2337 by Alejos Husband, RN Outcome: Progressing   Problem: Skin Integrity: Goal: Risk for impaired skin integrity will decrease 02/18/2024 0034 by Alejos Husband, RN Outcome: Progressing 02/17/2024 2337 by Alejos Husband, RN Outcome: Progressing

## 2024-02-18 NOTE — Plan of Care (Signed)
  Problem: Nutrition: Goal: Adequate nutrition will be maintained Outcome: Not Progressing   Pt refusing meals and supplements, has very poor appetite. Encouraged pt to eat little amounts at a time that are high in nutrients.

## 2024-02-19 ENCOUNTER — Other Ambulatory Visit: Payer: Self-pay

## 2024-02-19 LAB — RENAL FUNCTION PANEL
Albumin: 2.5 g/dL — ABNORMAL LOW (ref 3.5–5.0)
Anion gap: 12 (ref 5–15)
BUN: 90 mg/dL — ABNORMAL HIGH (ref 8–23)
CO2: 21 mmol/L — ABNORMAL LOW (ref 22–32)
Calcium: 10.1 mg/dL (ref 8.9–10.3)
Chloride: 101 mmol/L (ref 98–111)
Creatinine, Ser: 2.04 mg/dL — ABNORMAL HIGH (ref 0.61–1.24)
GFR, Estimated: 34 mL/min — ABNORMAL LOW (ref >60)
Glucose, Bld: 117 mg/dL — ABNORMAL HIGH (ref 70–99)
Phosphorus: 4.1 mg/dL (ref 2.5–4.6)
Potassium: 4.3 mmol/L (ref 3.5–5.1)
Sodium: 134 mmol/L — ABNORMAL LOW (ref 135–145)

## 2024-02-19 LAB — GLUCOSE, CAPILLARY
Glucose-Capillary: 104 mg/dL — ABNORMAL HIGH (ref 70–99)
Glucose-Capillary: 112 mg/dL — ABNORMAL HIGH (ref 70–99)
Glucose-Capillary: 113 mg/dL — ABNORMAL HIGH (ref 70–99)
Glucose-Capillary: 80 mg/dL (ref 70–99)
Glucose-Capillary: 81 mg/dL (ref 70–99)
Glucose-Capillary: 90 mg/dL (ref 70–99)
Glucose-Capillary: 92 mg/dL (ref 70–99)
Glucose-Capillary: 96 mg/dL (ref 70–99)

## 2024-02-19 MED ORDER — POLYETHYLENE GLYCOL 3350 17 G PO PACK
17.0000 g | PACK | Freq: Every day | ORAL | Status: DC
  Administered 2024-02-20 – 2024-02-21 (×2): 17 g via ORAL
  Filled 2024-02-19 (×3): qty 1

## 2024-02-19 MED ORDER — PANCRELIPASE (LIP-PROT-AMYL) 10440-39150 UNITS PO TABS
20880.0000 [IU] | ORAL_TABLET | Freq: Once | ORAL | Status: AC
  Administered 2024-02-19: 20880 [IU]
  Filled 2024-02-19: qty 2

## 2024-02-19 MED ORDER — FAMOTIDINE 20 MG PO TABS: 20.0000 mg | ORAL_TABLET | Freq: Every day | ORAL | Status: DC

## 2024-02-19 MED ORDER — FAMOTIDINE 20 MG PO TABS
20.0000 mg | ORAL_TABLET | Freq: Every day | ORAL | Status: DC
  Administered 2024-02-20: 20 mg via ORAL
  Filled 2024-02-19: qty 1

## 2024-02-19 MED ORDER — FOLIC ACID 1 MG PO TABS
1.0000 mg | ORAL_TABLET | Freq: Every day | ORAL | Status: DC
  Administered 2024-02-20 – 2024-02-21 (×2): 1 mg via ORAL
  Filled 2024-02-19 (×3): qty 1

## 2024-02-19 MED ORDER — VITAMIN C 500 MG PO TABS
500.0000 mg | ORAL_TABLET | Freq: Three times a day (TID) | ORAL | Status: DC
  Administered 2024-02-19 – 2024-02-21 (×8): 500 mg via ORAL
  Filled 2024-02-19 (×9): qty 1

## 2024-02-19 MED ORDER — DOCUSATE SODIUM 50 MG/5ML PO LIQD
100.0000 mg | Freq: Two times a day (BID) | ORAL | Status: DC
  Administered 2024-02-19 – 2024-02-21 (×5): 100 mg via ORAL
  Filled 2024-02-19 (×6): qty 10

## 2024-02-19 MED ORDER — ACETAMINOPHEN 500 MG PO TABS
1000.0000 mg | ORAL_TABLET | Freq: Four times a day (QID) | ORAL | Status: DC
  Administered 2024-02-19 – 2024-02-21 (×8): 1000 mg via ORAL
  Filled 2024-02-19 (×10): qty 2

## 2024-02-19 MED ORDER — METOPROLOL TARTRATE 25 MG/10 ML ORAL SUSPENSION
12.5000 mg | Freq: Two times a day (BID) | ORAL | Status: DC
  Administered 2024-02-19 – 2024-02-21 (×5): 12.5 mg via ORAL
  Filled 2024-02-19 (×7): qty 5

## 2024-02-19 MED ORDER — VITAMIN C 500 MG PO TABS: 500.0000 mg | ORAL_TABLET | Freq: Three times a day (TID) | ORAL | Status: DC

## 2024-02-19 MED ORDER — SODIUM BICARBONATE 650 MG PO TABS
650.0000 mg | ORAL_TABLET | Freq: Once | ORAL | Status: AC
  Administered 2024-02-19: 650 mg
  Filled 2024-02-19: qty 1

## 2024-02-19 MED ORDER — THIAMINE MONONITRATE 100 MG PO TABS
100.0000 mg | ORAL_TABLET | Freq: Every day | ORAL | Status: DC
  Administered 2024-02-20 – 2024-02-21 (×2): 100 mg via ORAL
  Filled 2024-02-19 (×3): qty 1

## 2024-02-19 MED ORDER — ADULT MULTIVITAMIN W/MINERALS CH
1.0000 | ORAL_TABLET | Freq: Every day | ORAL | Status: DC
  Administered 2024-02-20 – 2024-02-21 (×2): 1 via ORAL
  Filled 2024-02-19 (×3): qty 1

## 2024-02-19 MED ORDER — LEVOTHYROXINE SODIUM 25 MCG PO TABS
25.0000 ug | ORAL_TABLET | Freq: Every day | ORAL | Status: DC
  Administered 2024-02-20 – 2024-02-21 (×2): 25 ug via ORAL
  Filled 2024-02-19 (×2): qty 1

## 2024-02-19 NOTE — Plan of Care (Signed)
  Problem: Education: Goal: Knowledge of General Education information will improve Description: Including pain rating scale, medication(s)/side effects and non-pharmacologic comfort measures 02/19/2024 0151 by Alejos Husband, RN Outcome: Progressing 02/18/2024 2051 by Alejos Husband, RN Outcome: Progressing   Problem: Health Behavior/Discharge Planning: Goal: Ability to manage health-related needs will improve 02/19/2024 0151 by Alejos Husband, RN Outcome: Progressing 02/18/2024 2051 by Alejos Husband, RN Outcome: Progressing   Problem: Clinical Measurements: Goal: Ability to maintain clinical measurements within normal limits will improve 02/19/2024 0151 by Alejos Husband, RN Outcome: Progressing 02/18/2024 2051 by Alejos Husband, RN Outcome: Progressing Goal: Will remain free from infection 02/19/2024 0151 by Alejos Husband, RN Outcome: Progressing 02/18/2024 2051 by Alejos Husband, RN Outcome: Progressing Goal: Diagnostic test results will improve 02/19/2024 0151 by Alejos Husband, RN Outcome: Progressing 02/18/2024 2051 by Alejos Husband, RN Outcome: Progressing Goal: Respiratory complications will improve 02/19/2024 0151 by Alejos Husband, RN Outcome: Progressing 02/18/2024 2051 by Alejos Husband, RN Outcome: Progressing Goal: Cardiovascular complication will be avoided 02/19/2024 0151 by Alejos Husband, RN Outcome: Progressing 02/18/2024 2051 by Alejos Husband, RN Outcome: Progressing   Problem: Activity: Goal: Risk for activity intolerance will decrease 02/19/2024 0151 by Alejos Husband, RN Outcome: Progressing 02/18/2024 2051 by Alejos Husband, RN Outcome: Progressing   Problem: Nutrition: Goal: Adequate nutrition will be maintained 02/19/2024 0151 by Alejos Husband, RN Outcome: Progressing 02/18/2024 2051 by Alejos Husband, RN Outcome: Progressing   Problem: Coping: Goal: Level of anxiety will  decrease 02/19/2024 0151 by Alejos Husband, RN Outcome: Progressing 02/18/2024 2051 by Alejos Husband, RN Outcome: Progressing   Problem: Elimination: Goal: Will not experience complications related to bowel motility 02/19/2024 0151 by Alejos Husband, RN Outcome: Progressing 02/18/2024 2051 by Alejos Husband, RN Outcome: Progressing Goal: Will not experience complications related to urinary retention 02/19/2024 0151 by Alejos Husband, RN Outcome: Progressing 02/18/2024 2051 by Alejos Husband, RN Outcome: Progressing   Problem: Pain Managment: Goal: General experience of comfort will improve and/or be controlled 02/19/2024 0151 by Alejos Husband, RN Outcome: Progressing 02/18/2024 2051 by Alejos Husband, RN Outcome: Progressing   Problem: Safety: Goal: Ability to remain free from injury will improve 02/19/2024 0151 by Alejos Husband, RN Outcome: Progressing 02/18/2024 2051 by Alejos Husband, RN Outcome: Progressing   Problem: Skin Integrity: Goal: Risk for impaired skin integrity will decrease 02/19/2024 0151 by Alejos Husband, RN Outcome: Progressing 02/18/2024 2051 by Alejos Husband, RN Outcome: Progressing

## 2024-02-19 NOTE — Progress Notes (Addendum)
 Subjective: CC: Reports no pain or HA today. Patient found sitting in chair having breakfast and tolerating well w/o N/V. Had BM overnight. No other complaints at this time. Voiding well. Patient in NAD. There were some issues with his cortrak between last night and this AM. Nursing aware and has/will troubleshoot.   Afebrile. HR 96 this am. No hypotension. On RA.    Objective: Vital signs in last 24 hours: Temp:  [97.6 F (36.4 C)-98.6 F (37 C)] 97.6 F (36.4 C) (06/15 0744) Pulse Rate:  [88-100] 96 (06/15 0744) Resp:  [12-20] 12 (06/15 0744) BP: (112-144)/(72-91) 118/85 (06/15 0744) SpO2:  [92 %-98 %] 98 % (06/15 0744) Weight:  [59.3 kg] 59.3 kg (06/15 0500) Last BM Date : 02/14/24  Intake/Output from previous day: 06/14 0701 - 06/15 0700 In: 240 [NG/GT:240] Out: 450 [Urine:450] Intake/Output this shift: No intake/output data recorded.  PE: Gen:  Alert, NAD, pleasant HEENT: EOM's intact, pupils equal and round Card:  HR WNL  Pulm:  Rate and effort normal Abd: Soft, ND, NT Ext:  No LE edema or calf tenderness Psych: A&Ox4 Neuro: CN 3-12 grossly intact, MAE's, f/c, non-focal  Lab Results:  No results for input(s): WBC, HGB, HCT, PLT in the last 72 hours.  BMET Recent Labs    02/18/24 0512 02/19/24 0646  NA 134* 134*  K 4.3 4.3  CL 105 101  CO2 23 21*  GLUCOSE 93 117*  BUN 83* 90*  CREATININE 1.94* 2.04*  CALCIUM 9.6 10.1   PT/INR No results for input(s): LABPROT, INR in the last 72 hours. CMP     Component Value Date/Time   NA 134 (L) 02/19/2024 0646   K 4.3 02/19/2024 0646   CL 101 02/19/2024 0646   CO2 21 (L) 02/19/2024 0646   GLUCOSE 117 (H) 02/19/2024 0646   BUN 90 (H) 02/19/2024 0646   CREATININE 2.04 (H) 02/19/2024 0646   CALCIUM 10.1 02/19/2024 0646   PROT 5.8 (L) 02/13/2024 0531   ALBUMIN 2.5 (L) 02/19/2024 0646   AST 24 02/13/2024 0531   ALT 24 02/13/2024 0531   ALKPHOS 133 (H) 02/13/2024 0531   BILITOT 0.3  02/13/2024 0531   GFRNONAA 34 (L) 02/19/2024 0646   Lipase  No results found for: LIPASE  Studies/Results: No results found.   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 Rochelle Chu  Loaded with Keppra  in ED, completed 1 week BID keppra , F/U CT head slightly increased SDH but exam stable. TBI therapies.  Bilateral rib fractures that were sustained in a fall on 01/21/2024, similar on follow-up CT scan - multimodal pain control, pulm toilet,  Acute hypoxic respiratory failure - self-extubated, weaned back to room air, continue flutter, chest PT, duonebs EtoH abuse and withdrawal - CIWA. MVI, thiamine , folic acid . No longer requiring dex or ativan . Tachycardia - Cards has seen and signed off. Cont metoprolol .  FEN: now on reg diet with thin liquids, encouraged PO, TF HS until can take in enough CKD/AKI - appreciate Nephrology F/U, Cr stable at 2 today VTE: PAS, [facility] Endo: TSH elevated, T3 P, T4 normal ID: None Foley: none Dispo: 4NP, therapies. He still does not want to go to SNF and continues to reiterate that he wants to go home with his GF. Plan remains to be focused on optimizing  him for D/C home. Goal is for him to eat more.  I reviewed nursing notes, Consultant (nephrology) notes, last 24 h vitals and pain scores, last 48 h intake and output, last 24 h labs and trends, and last 24 h imaging results.    LOS: 18 days    Mirta Ammon, [facility] 02/19/2024, 8:07 AM Please see Amion for pager number during day hours 7:00am-4:30pm

## 2024-02-19 NOTE — Plan of Care (Signed)
 PO intake remains low. Patient agreeable to protien milkshake and pro source. coretrak unclogged per protocol, nocturnal feeds started. Pt more independent today with mobility, fall risk precautions in place. Call light in reach and bed alarm on.

## 2024-02-20 LAB — RENAL FUNCTION PANEL
Albumin: 2.8 g/dL — ABNORMAL LOW (ref 3.5–5.0)
Anion gap: 10 (ref 5–15)
BUN: 89 mg/dL — ABNORMAL HIGH (ref 8–23)
CO2: 21 mmol/L — ABNORMAL LOW (ref 22–32)
Calcium: 10.4 mg/dL — ABNORMAL HIGH (ref 8.9–10.3)
Chloride: 103 mmol/L (ref 98–111)
Creatinine, Ser: 2.24 mg/dL — ABNORMAL HIGH (ref 0.61–1.24)
GFR, Estimated: 31 mL/min — ABNORMAL LOW (ref >60)
Glucose, Bld: 103 mg/dL — ABNORMAL HIGH (ref 70–99)
Phosphorus: 4.9 mg/dL — ABNORMAL HIGH (ref 2.5–4.6)
Potassium: 4.7 mmol/L (ref 3.5–5.1)
Sodium: 134 mmol/L — ABNORMAL LOW (ref 135–145)

## 2024-02-20 LAB — GLUCOSE, CAPILLARY
Glucose-Capillary: 91 mg/dL (ref 70–99)
Glucose-Capillary: 103 mg/dL — ABNORMAL HIGH (ref 70–99)
Glucose-Capillary: 119 mg/dL — ABNORMAL HIGH (ref 70–99)
Glucose-Capillary: 104 mg/dL — ABNORMAL HIGH (ref 70–99)
Glucose-Capillary: 128 mg/dL — ABNORMAL HIGH (ref 70–99)
Glucose-Capillary: 157 mg/dL — ABNORMAL HIGH (ref 70–99)

## 2024-02-20 MED ORDER — FAMOTIDINE 20 MG PO TABS
10.0000 mg | ORAL_TABLET | Freq: Every day | ORAL | Status: DC
  Administered 2024-02-21: 10 mg via ORAL
  Filled 2024-02-20 (×2): qty 1

## 2024-02-20 NOTE — Progress Notes (Signed)
 Subjective: CC: NAEO. Oriented to person, [place], 2025, fall down the steps. States he is not really eating. Refusing SNF.  Walking to bathroom with walker and help from NT.  Objective: Vital signs in last 24 hours: Temp:  [97.6 F (36.4 C)-98.9 F (37.2 C)] 98.2 F (36.8 C) (06/16 0750) Pulse Rate:  [87-103] 103 (06/16 0844) Resp:  [14-20] 20 (06/16 0302) BP: (100-128)/(62-89) 117/74 (06/16 0844) SpO2:  [91 %-98 %] 91 % (06/16 0750) Weight:  [58 kg] 58 kg (06/16 0350) Last BM Date : 02/19/24  Intake/Output from previous day: 06/15 0701 - 06/16 0700 In: 675 [NG/GT:675] Out: 400 [Urine:400] Intake/Output this shift: No intake/output data recorded.  PE: Gen:  Alert, NAD, pleasant and cooperative HEENT: EOM's intact, pupils equal and round Card:  HR WNL  Pulm:  Rate and effort normal Abd: Soft, ND, NT Ext:  No LE edema or calf tenderness Psych: A&Ox4 Neuro: CN 3-12 grossly intact, MAE's, f/c, non-focal  BMET Recent Labs    02/19/24 0646 02/20/24 0611  NA 134* 134*  K 4.3 4.7  CL 101 103  CO2 21* 21*  GLUCOSE 117* 103*  BUN 90* 89*  CREATININE 2.04* 2.24*  CALCIUM 10.1 10.4*   PT/INR No results for input(s): LABPROT, INR in the last 72 hours. CMP     Component Value Date/Time   NA 134 (L) 02/20/2024 0611   K 4.7 02/20/2024 0611   CL 103 02/20/2024 0611   CO2 21 (L) 02/20/2024 0611   GLUCOSE 103 (H) 02/20/2024 0611   BUN 89 (H) 02/20/2024 0611   CREATININE 2.24 (H) 02/20/2024 0611   CALCIUM 10.4 (H) 02/20/2024 0611   PROT 5.8 (L) 02/13/2024 0531   ALBUMIN 2.8 (L) 02/20/2024 0611   AST 24 02/13/2024 0531   ALT 24 02/13/2024 0531   ALKPHOS 133 (H) 02/13/2024 0531   BILITOT 0.3 02/13/2024 0531   GFRNONAA 31 (L) 02/20/2024 0611   Lipase  No results found for: LIPASE  Studies/Results: No results found.   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 Rochelle Chu  Loaded with Keppra  in ED, completed 1 week BID keppra , F/U CT head slightly increased SDH but exam stable. TBI therapies.  Bilateral rib fractures that were sustained in a fall on 01/21/2024, similar on follow-up CT scan - multimodal pain control, pulm toilet,  Acute hypoxic respiratory failure - self-extubated, weaned back to room air, continue flutter, chest PT, duonebs EtoH abuse and withdrawal - CIWA. MVI, thiamine , folic acid . No longer requiring dex or ativan . Tachycardia - Cards has seen and signed off. Cont metoprolol .  FEN: now on reg diet with thin liquids, encouraged PO, TF HS until can take in enough CKD/AKI - appreciate Nephrology F/U  VTE: PAS, SQH Endo: TSH elevated, T3 P, T4 normal ID: None Foley: none Dispo: 4NP, therapies. He still does not want to go to SNF and continues to reiterate that he wants to go home with his GF. Mobility and PO intake remain a barrier to safe discharge home.   He has not been walking with therapies and we discussed that if he wants to progress to home level of care he will have to walk with and progress with therapies.   I spoke to his girlfriend on the phone, Greg Leaks, and she states  that the patient cannot go home with her in his current condition.. States that he drinks at home and has repeated falls. She states she has a bad heart and is unable to physically care for him. She expresses hesitation about telling the patient this news, she doesn't want him to be mad. She also adds that she has 5 step entry into her home as well as 15 steps inside the home.    I reviewed nursing notes, Consultant (nephrology) notes, last 24 h vitals and pain scores, last 48 h intake and output, last 24 h labs and trends, and last 24 h imaging results.    LOS: 19 days    Charlott Converse, [facility]  Surgery 02/20/2024, 9:32 AM Please see Amion for pager number during day hours 7:00am-4:30pm

## 2024-02-20 NOTE — Plan of Care (Signed)
  Problem: Education: Goal: Knowledge of General Education information will improve Description: Including pain rating scale, medication(s)/side effects and non-pharmacologic comfort measures 02/20/2024 0358 by Alejos Husband, RN Outcome: Progressing 02/19/2024 2041 by Alejos Husband, RN Outcome: Progressing   Problem: Health Behavior/Discharge Planning: Goal: Ability to manage health-related needs will improve 02/20/2024 0358 by Alejos Husband, RN Outcome: Progressing 02/19/2024 2041 by Alejos Husband, RN Outcome: Progressing   Problem: Clinical Measurements: Goal: Ability to maintain clinical measurements within normal limits will improve 02/20/2024 0358 by Alejos Husband, RN Outcome: Progressing 02/19/2024 2041 by Alejos Husband, RN Outcome: Progressing Goal: Will remain free from infection 02/20/2024 0358 by Alejos Husband, RN Outcome: Progressing 02/19/2024 2041 by Alejos Husband, RN Outcome: Progressing Goal: Diagnostic test results will improve 02/20/2024 0358 by Alejos Husband, RN Outcome: Progressing 02/19/2024 2041 by Alejos Husband, RN Outcome: Progressing Goal: Respiratory complications will improve 02/20/2024 0358 by Alejos Husband, RN Outcome: Progressing 02/19/2024 2041 by Alejos Husband, RN Outcome: Progressing Goal: Cardiovascular complication will be avoided 02/20/2024 0358 by Alejos Husband, RN Outcome: Progressing 02/19/2024 2041 by Alejos Husband, RN Outcome: Progressing   Problem: Activity: Goal: Risk for activity intolerance will decrease 02/20/2024 0358 by Alejos Husband, RN Outcome: Progressing 02/19/2024 2041 by Alejos Husband, RN Outcome: Progressing   Problem: Nutrition: Goal: Adequate nutrition will be maintained 02/20/2024 0358 by Alejos Husband, RN Outcome: Progressing 02/19/2024 2041 by Alejos Husband, RN Outcome: Progressing   Problem: Coping: Goal: Level of anxiety will  decrease 02/20/2024 0358 by Alejos Husband, RN Outcome: Progressing 02/19/2024 2041 by Alejos Husband, RN Outcome: Progressing   Problem: Elimination: Goal: Will not experience complications related to bowel motility 02/20/2024 0358 by Alejos Husband, RN Outcome: Progressing 02/19/2024 2041 by Alejos Husband, RN Outcome: Progressing Goal: Will not experience complications related to urinary retention 02/20/2024 0358 by Alejos Husband, RN Outcome: Progressing 02/19/2024 2041 by Alejos Husband, RN Outcome: Progressing   Problem: Pain Managment: Goal: General experience of comfort will improve and/or be controlled 02/20/2024 0358 by Alejos Husband, RN Outcome: Progressing 02/19/2024 2041 by Alejos Husband, RN Outcome: Progressing   Problem: Safety: Goal: Ability to remain free from injury will improve 02/20/2024 0358 by Alejos Husband, RN Outcome: Progressing 02/19/2024 2041 by Alejos Husband, RN Outcome: Progressing   Problem: Skin Integrity: Goal: Risk for impaired skin integrity will decrease 02/20/2024 0358 by Alejos Husband, RN Outcome: Progressing 02/19/2024 2041 by Alejos Husband, RN Outcome: Progressing

## 2024-02-20 NOTE — Plan of Care (Signed)

## 2024-02-20 NOTE — Progress Notes (Signed)
 Physical Therapy Treatment Patient Details Name: [name] MRN: [id number] DOB: April 22, 1951 Today's Date: 02/20/2024   History of Present Illness 73 yo male presenting to ED 5/28 after being found seizing after falling down stairs. CTH bil SDH and SAH with trace IVH and non-depressed occipital skull fx. Intubated 5/28, self-extubated 5/30. Recently sustained bilateral rib fxs after a fall 5/17. Imaging also showed stable subacute fractures involving the right sacral ala, right superior and inferior rami, and left parasymphyseal region along with stable superior endplate compression deformities at T3, T4, and T5 since 01/21/2024. PMH includes ETOH abuse s/p two liver transplants, 5/17 fall with R sacral ala superior and inferior rami fx, L superior endplate compression deformities at T3 T4 T5    PT Comments  Pt endorsing just waking up at 1125 upon PT arrival to room, PT explained goal of session was to progress gait and practice stairs. Pt agrees to this. Once pt sitting EOB, pt endorsing needing to urinate, overall requiring CGA for short distance mobility in room. Once in bathroom pt missing toilet completely and urinated on floor, pt showing little to no awareness of this. Pt declined further mobility after exit bathroom, even when PT reminded pt of functional goals of session. Pt requests come back later but PT unable to do so. Pt is self-limiting at this point, will continue to progress pt as able.      If plan is discharge home, recommend the following: Assistance with cooking/housework;Assist for transportation;Help with stairs or ramp for entrance;Supervision due to cognitive status;A little help with walking and/or transfers   Can travel by private vehicle        Equipment Recommendations  Rolling walker (2 wheels);BSC/3in1;Wheelchair (measurements PT)    Recommendations for Other Services       Precautions / Restrictions Precautions Precautions: Fall Recall of  Precautions/Restrictions: Impaired Precaution/Restrictions Comments: cortrak Restrictions Weight Bearing Restrictions Per Provider Order: No Other Position/Activity Restrictions: Dr. Aniceto Barley confirmed no spinal precautions or weight bearing restrictions 5/31     Mobility  Bed Mobility Overal bed mobility: Needs Assistance Bed Mobility: Supine to Sit, Sit to Supine     Supine to sit: Supervision, HOB elevated, Used rails Sit to supine: Mod assist, Used rails, HOB elevated (for boost up in bed)   General bed mobility comments: increased time, requires boost up assist upon return to supine    Transfers Overall transfer level: Needs assistance Equipment used: Rolling walker (2 wheels) Transfers: Sit to/from Stand Sit to Stand: Contact guard assist           General transfer comment: for safety, slow to rise and sit    Ambulation/Gait Ambulation/Gait assistance: Contact guard assist Gait Distance (Feet): 10 Feet (x2 - to and from toilet) Assistive device: Rolling walker (2 wheels) Gait Pattern/deviations: Decreased dorsiflexion - right, Decreased stride length, Step-through pattern Gait velocity: decr     General Gait Details: upon standing pt endorsing need to toilet, close guard for safety, cues for upright posture and placement in RW   Stairs             Wheelchair Mobility     Tilt Bed    Modified Rankin (Stroke Patients Only)       Balance Overall balance assessment: Needs assistance Sitting-balance support: No upper extremity supported, Feet supported Sitting balance-Leahy Scale: Good     Standing balance support: Bilateral upper extremity supported Standing balance-Leahy Scale: Poor Standing balance comment: reliant on BUE support  Communication Communication Communication: Impaired Factors Affecting Communication: Hearing impaired  Cognition Arousal: Alert Behavior During Therapy: Flat affect   PT -  Cognitive impairments: Awareness, Memory, Attention, Initiation, Safety/Judgement                   Rancho Levels of Cognitive Functioning Rancho Los Amigos Scales of Cognitive Functioning: Automatic, Appropriate: Minimal Assistance for Daily Living Skills Rancho Los Amigos Scales of Cognitive Functioning: Automatic, Appropriate: Minimal Assistance for Daily Living Skills [VII] PT - Cognition Comments: requires max frequent sequencing cfor initiating tasks Following commands: Impaired Following commands impaired: Only follows one step commands consistently, Follows one step commands with increased time    Cueing    Exercises      General Comments General comments (skin integrity, edema, etc.): HR up to 120s      Pertinent Vitals/Pain Pain Assessment Pain Assessment: Faces Faces Pain Scale: Hurts a little bit Pain Location: generalized Pain Descriptors / Indicators: Discomfort, Grimacing Pain Intervention(s): Repositioned, Monitored during session, Limited activity within patient's tolerance    Home Living                          Prior Function            PT Goals (current goals can now be found in the care plan section) Acute Rehab PT Goals Potential to Achieve Goals: Good Progress towards PT goals: Progressing toward goals    Frequency    Min 3X/week      PT Plan      Co-evaluation              AM-PAC PT 6 Clicks Mobility   Outcome Measure  Help needed turning from your back to your side while in a flat bed without using bedrails?: A Little Help needed moving from lying on your back to sitting on the side of a flat bed without using bedrails?: A Little Help needed moving to and from a bed to a chair (including a wheelchair)?: A Little Help needed standing up from a chair using your arms (e.g., wheelchair or bedside chair)?: A Little Help needed to walk in hospital room?: A Little Help needed climbing 3-5 steps with a railing? : A  Lot 6 Click Score: 17    End of Session Equipment Utilized During Treatment: Gait belt Activity Tolerance: Patient limited by fatigue Patient left: in bed;with call bell/phone within reach;with bed alarm set Nurse Communication: Mobility status PT Visit Diagnosis: Unsteadiness on feet (R26.81);Muscle weakness (generalized) (M62.81);Other symptoms and signs involving the nervous system (R29.898)     Time: 4098-1191 PT Time Calculation (min) (ACUTE ONLY): 19 min  Charges:    $Gait Training: 8-22 mins PT General Charges $$ ACUTE PT VISIT: 1 Visit                     Shirlene Doughty, PT DPT Acute Rehabilitation Services Secure Chat Preferred  Office [phone]    Amaka Gluth E Burnadette Carrion 02/20/2024, 2:24 PM

## 2024-02-21 DIAGNOSIS — S065XAA Traumatic subdural hemorrhage with loss of consciousness status unknown, initial encounter: Secondary | ICD-10-CM | POA: Diagnosis not present

## 2024-02-21 DIAGNOSIS — Z7189 Other specified counseling: Secondary | ICD-10-CM

## 2024-02-21 DIAGNOSIS — Z515 Encounter for palliative care: Secondary | ICD-10-CM

## 2024-02-21 LAB — RENAL FUNCTION PANEL
Albumin: 2.6 g/dL — ABNORMAL LOW (ref 3.5–5.0)
Anion gap: 11 (ref 5–15)
BUN: 110 mg/dL — ABNORMAL HIGH (ref 8–23)
CO2: 20 mmol/L — ABNORMAL LOW (ref 22–32)
Calcium: 9.8 mg/dL (ref 8.9–10.3)
Chloride: 103 mmol/L (ref 98–111)
Creatinine, Ser: 2.7 mg/dL — ABNORMAL HIGH (ref 0.61–1.24)
GFR, Estimated: 25 mL/min — ABNORMAL LOW (ref >60)
Glucose, Bld: 106 mg/dL — ABNORMAL HIGH (ref 70–99)
Phosphorus: 4 mg/dL (ref 2.5–4.6)
Potassium: 5 mmol/L (ref 3.5–5.1)
Sodium: 134 mmol/L — ABNORMAL LOW (ref 135–145)

## 2024-02-21 LAB — GLUCOSE, CAPILLARY
Glucose-Capillary: 109 mg/dL — ABNORMAL HIGH (ref 70–99)
Glucose-Capillary: 115 mg/dL — ABNORMAL HIGH (ref 70–99)
Glucose-Capillary: 136 mg/dL — ABNORMAL HIGH (ref 70–99)
Glucose-Capillary: 135 mg/dL — ABNORMAL HIGH (ref 70–99)
Glucose-Capillary: 110 mg/dL — ABNORMAL HIGH (ref 70–99)
Glucose-Capillary: 106 mg/dL — ABNORMAL HIGH (ref 70–99)

## 2024-02-21 MED ORDER — MIRTAZAPINE 15 MG PO TBDP
7.5000 mg | ORAL_TABLET | Freq: Every day | ORAL | Status: DC
  Administered 2024-02-21: 7.5 mg via ORAL
  Filled 2024-02-21 (×2): qty 0.5

## 2024-02-21 NOTE — Progress Notes (Signed)
 Trauma/Critical Care Follow Up Note  Subjective:    Overnight Issues:   Objective:  Vital signs for last 24 hours: Temp:  [97.7 F (36.5 C)-98.2 F (36.8 C)] 98.2 F (36.8 C) (06/17 0733) Pulse Rate:  [69-120] 102 (06/17 0733) Resp:  [12-16] 13 (06/17 0733) BP: (96-109)/(55-69) 109/60 (06/17 0733) SpO2:  [90 %-100 %] 96 % (06/17 0733) Weight:  [57.4 kg] 57.4 kg (06/17 0325)  Hemodynamic parameters for last 24 hours:    Intake/Output from previous day: 06/16 0701 - 06/17 0700 In: 120 [NG/GT:120] Out: -   Intake/Output this shift: No intake/output data recorded.  Vent settings for last 24 hours:    Physical Exam:  Gen: comfortable, no distress Neuro: follows commands, alert, communicative HEENT: PERRL Neck: supple CV: RRR Pulm: unlabored breathing on Central Abd: soft, NT  , +BM GU: urine clear and yellow, +spontaneous voids Extr: wwp, no edema  Results for orders placed or performed during the hospital encounter of 02/01/24 (from the past 24 hours)  Glucose, capillary     Status: Abnormal   Collection Time: 02/20/24 11:48 AM  Result Value Ref Range   Glucose-Capillary 119 (H) 70 - 99 mg/dL  Glucose, capillary     Status: Abnormal   Collection Time: 02/20/24  4:15 PM  Result Value Ref Range   Glucose-Capillary 104 (H) 70 - 99 mg/dL  Glucose, capillary     Status: Abnormal   Collection Time: 02/20/24  7:52 PM  Result Value Ref Range   Glucose-Capillary 128 (H) 70 - 99 mg/dL  Glucose, capillary     Status: Abnormal   Collection Time: 02/20/24 11:24 PM  Result Value Ref Range   Glucose-Capillary 157 (H) 70 - 99 mg/dL  Glucose, capillary     Status: Abnormal   Collection Time: 02/21/24  3:26 AM  Result Value Ref Range   Glucose-Capillary 109 (H) 70 - 99 mg/dL  Renal function panel     Status: Abnormal   Collection Time: 02/21/24  6:01 AM  Result Value Ref Range   Sodium 134 (L) 135 - 145 mmol/L   Potassium 5.0 3.5 - 5.1 mmol/L   Chloride 103 98 - 111  mmol/L   CO2 20 (L) 22 - 32 mmol/L   Glucose, Bld 106 (H) 70 - 99 mg/dL   BUN 409 (H) 8 - 23 mg/dL   Creatinine, Ser 8.11 (H) 0.61 - 1.24 mg/dL   Calcium 9.8 8.9 - 91.4 mg/dL   Phosphorus 4.0 2.5 - 4.6 mg/dL   Albumin 2.6 (L) 3.5 - 5.0 g/dL   GFR, Estimated 25 (L) >60 mL/min   Anion gap 11 5 - 15  Glucose, capillary     Status: Abnormal   Collection Time: 02/21/24  7:32 AM  Result Value Ref Range   Glucose-Capillary 115 (H) 70 - 99 mg/dL    Assessment & Plan:  Present on Admission:  Subdural hematoma (HCC)    LOS: 20 days   Additional comments:I reviewed the patient's new clinical lab test results.   and I reviewed the patients new imaging test results.     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 Rochelle Chu  Loaded with Keppra  in ED, completed 1 week BID keppra , F/U CT head slightly increased SDH but exam stable. TBI therapies.  Bilateral rib fractures that were sustained in a fall on 01/21/2024, similar on follow-up CT scan - multimodal pain control, pulm toilet,  Acute  hypoxic respiratory failure - self-extubated, weaned back to room air, continue flutter, chest PT, duonebs EtoH abuse and withdrawal - CIWA. MVI, thiamine , folic acid . No longer requiring dex or ativan . Tachycardia - Cards has seen and signed off. Cont metoprolol .  FEN: now on reg diet with thin liquids, encouraged PO, TF HS until can take in enough CKD/AKI - appreciate Nephrology F/U  VTE: PAS, SQH Endo: TSH elevated, T3 P, T4 normal ID: None Foley: none Dispo: 4NP, therapies. He still does not want to go to SNF and continues to reiterate that he wants to go home with his GF. I have told him again today that going home with his GF is not an option and that his current options are SNF or homelessness. He chooses homelessness. We discussed his negligible PO intake and my concerns regarding this as well as  his ability to obtain his medications he is unable to communicate a plan regarding this. I called both his gf and his sister and had a combined conversation with them on speaerphone and in the patient's room. I shared with them the patient's decision to choose discharge to a homeless shelter. They are appropriate strongly against this decision and feel the decision is rooted in a desire to resume alcohol  consumption and easier acces to alcohol  if at a homeless shelter. They similarly have concerns regarding food and medication access and urged him to reconsider peg placement. By the end of the conversation he was agreeable to consider peg and requested the remainder of the morning to thin about it further. I also raised the concern about his decision-maing not being aligned with his current code status and that I would engage our palliative care team to parse this out further. For the sae of completeness I am also engaging the psychiatry team to r/o depression as a contributor and also to ensure that the patient has capacity for this level of decision-maing. If after these evaluations and revisitation of peg patient still declines peg/snf will proceed with dispo planning to shelter in the next 24-48h.    Anda Bamberg, MD Trauma & General Surgery Please use [email or web address] to contact on call provider  02/21/2024  *Care during the described time interval was provided by me. I have reviewed this patient's available data, including medical history, events of note, physical examination and test results as part of my evaluation.

## 2024-02-21 NOTE — Progress Notes (Signed)
 Patient now NPO for PEG procedure.

## 2024-02-21 NOTE — Consult Note (Signed)
 Palliative Medicine Inpatient Consult Note  Consulting Provider: Dr. Aniceto Barley   Reason for consult:   Palliative Care Consult Services Palliative Medicine Consult  Reason for Consult? goc   02/21/2024  HPI:  Per intake H&P --> 73 yo male presenting to ED 5/28 after being found seizing after falling down stairs. CTH bil SDH and SAH with trace IVH and non-depressed occipital skull fx. Intubated 5/28, self-extubated 5/30.  Palliative care has been asked to get involved to address goals of care in the setting of chronic disease, adult failure to thrive, and muscular deconditioning.  Clinical Assessment/Goals of Care:  *Please note that this is a verbal dictation therefore any spelling or grammatical errors are due to the Dragon Medical One system interpretation.  I have reviewed medical records including EPIC notes, labs and imaging, received report from bedside RN, assessed the patient who is lying in bed endorses no distress at the time of assessment.  Keigan has been deemed to not have medical decision-making capacity as of psychiatry team.  Kishaun shares with me that he no longer wants to go to a homeless shelter but want to go home with his girlfriend Rice Chamorro.   Ambrosio shares he has no appetite which is why he is not eating. He is aware of the possible need for placement of a G-tube.  ______________________   I spoke to patient's sister, Marily Shows to further discuss diagnosis prognosis, GOC, EOL wishes, disposition and options.   I introduced Palliative Medicine as specialized medical care for people living with serious illness. It focuses on providing relief from the symptoms and stress of a serious illness. The goal is to improve quality of life for both the patient and the family.  Medical History Review and Understanding:  A review Jameek's past medical history significant for frequent falls, alcohol  abuse,  & chronic kidney disease was completed.  Social History:  Ester is from  [place] originally though has lived in the [place] area for > 20 years. He is not married and his former wife has passed away. He and his current girlfriend Rice Chamorro have been together for over twenty years. Truth has three children and two grandchildren though he does not keep in contact with them. 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 Chrishaun has faith in God.  Functional and Nutritional State:  Preceding hospitalization Vanna had been living with his girlfriend. He was apparently having frequent falls. He had been able to help with bADLs. His appetite has been fairly poor for sometime and he only drinks when at home.   Advance Directives:  A detailed discussion was had today regarding advanced directives. Patient does not have established HCPOA documents.     Code Status:  Concepts specific to code status, artifical feeding and hydration, continued IV antibiotics and rehospitalization was had.  The difference between a aggressive medical intervention path  and a palliative comfort care path for this patient at this time was had.   Encouraged patient/family to consider DNR/DNI status understanding evidenced based poor outcomes in similar hospitalized patient, as the cause of arrest is likely associated with advanced chronic/terminal illness rather than an easily reversible acute cardio-pulmonary event. I explained that DNR/DNI does not change the medical plan and it only comes into effect after a person has arrested (died).  It is a protective measure to keep us  from harming the patient in their last moments of life.   Patients sister shares that she would not want  to put [facility] through CPR though queries when to make that call. I shared if the feeling is the burdens of any intervention outweighs the benefits then it is reasonable to consider the decision further.   Discussion:  Marily Shows and I reviewed the significance of Jayesh's  hospitalization and the possible long term impact this will have on him functionally. Marily Shows shares that she has been in contact with patients children who Christell Cove has been mostly estranged from. They are going to determine whether or not they would like for her to be the primary decision maker for the patient.   Marily Shows and I talked about his present state and where to go from here. She shares that he has to re-gain strength. She remains concerned about the placement of a g-tube. Marily Shows is struggling to determine what is the best path moving forward for [place].   We talked about an aggressive mode of care with the hope of improvement. We discussed it patient neglects to improve then his body is proving its inability compensate and from there the possible pursuit of hospice.   We discussed the chronicity of ETOH addiction and the long term side effects associated with continued use.   We discussed what capacity is and how it can fluctuate from day to day.   Marily Shows shares her thoughts in regards to not doing anything and how this make her pause and feel guilt. We discussed the difficulties associated with the decisions at hand as she is in [place] and not able to see the patient. Allowed her time and space to express herself.   Plan for ongoing conversations over the next few days.   Discussed the importance of continued conversation with family and their  medical providers regarding overall plan of care and treatment options, ensuring decisions are within the context of the patients values and GOCs.  Decision Maker: Jayveion Stalling (sister): [phone]  SUMMARY OF RECOMMENDATIONS   Full Code / Full scope of care at this time  Patients sister shares understanding of patients acute on chronic disease burden  Patient sister is talking to [place]'s three children to better assert who will be patients decision maker  Patients sister aware of the concerns related to placement and nutrition  MSW is involved in  patients case  Plan for ongoing Palliative support  Code Status/Advance Care Planning: FULL CODE   Symptom Management:  Strict delirium precautions  Palliative Prophylaxis:  Aspiration, Bowel Regimen, Delirium Protocol, Frequent Pain Assessment, Oral Care, Palliative Wound Care, and Turn Reposition  Additional Recommendations (Limitations, Scope, Preferences): Continue current care  Psycho-social/Spiritual:  Desire for further Chaplaincy support: Not at this time Additional Recommendations: Education on disease burden and possible outcomes   Prognosis: Unclear at this time.  Discharge Planning: Likely to SNF.  Vitals:   02/21/24 0958 02/21/24 1122  BP: 103/69 98/64  Pulse: (!) 103 94  Resp:    Temp:    SpO2:  97%    Intake/Output Summary (Last 24 hours) at 02/21/2024 1525 Last data filed at 02/20/2024 2000 Gross per 24 hour  Intake 120 ml  Output --  Net 120 ml   Last Weight  Most recent update: 02/21/2024  3:28 AM    Weight  57.4 kg (126 lb 8.7 oz)            Gen:  [name] chronically ill in appearance HEENT: dry mucous membranes CV: irregular rate and regular rhythm  PULM:  On RA, breathing is even and nonlabored ABD:  soft/nontender  EXT: No edema  Neuro: Oriented to self and place    PPS: 30%   This conversation/these recommendations were discussed with patient primary care team, Dr. Aniceto Barley ______________________________________________________ [place] [facility] Palliative Medicine Team Team Cell Phone: [phone] Please utilize secure chat with additional questions, if there is no response within 30 minutes please call the above phone number  Total Time: 75 Billing based on MDM: High  Palliative Medicine Team providers are available by phone from 7am to 7pm daily and can be reached through the team cell phone.  Should this patient require assistance outside of these hours, please call the patient's attending  physician.

## 2024-02-21 NOTE — Plan of Care (Signed)
  Problem: Education: Goal: Knowledge of General Education information will improve Description: Including pain rating scale, medication(s)/side effects and non-pharmacologic comfort measures 02/21/2024 0127 by Alejos Husband, RN Outcome: Progressing 02/20/2024 2238 by Alejos Husband, RN Outcome: Progressing   Problem: Health Behavior/Discharge Planning: Goal: Ability to manage health-related needs will improve 02/21/2024 0127 by Alejos Husband, RN Outcome: Progressing 02/20/2024 2238 by Alejos Husband, RN Outcome: Progressing   Problem: Clinical Measurements: Goal: Ability to maintain clinical measurements within normal limits will improve 02/21/2024 0127 by Alejos Husband, RN Outcome: Progressing 02/20/2024 2238 by Alejos Husband, RN Outcome: Progressing Goal: Will remain free from infection 02/21/2024 0127 by Alejos Husband, RN Outcome: Progressing 02/20/2024 2238 by Alejos Husband, RN Outcome: Progressing Goal: Diagnostic test results will improve 02/21/2024 0127 by Alejos Husband, RN Outcome: Progressing 02/20/2024 2238 by Alejos Husband, RN Outcome: Progressing Goal: Respiratory complications will improve 02/21/2024 0127 by Alejos Husband, RN Outcome: Progressing 02/20/2024 2238 by Alejos Husband, RN Outcome: Progressing Goal: Cardiovascular complication will be avoided 02/21/2024 0127 by Alejos Husband, RN Outcome: Progressing 02/20/2024 2238 by Alejos Husband, RN Outcome: Progressing   Problem: Activity: Goal: Risk for activity intolerance will decrease 02/21/2024 0127 by Alejos Husband, RN Outcome: Progressing 02/20/2024 2238 by Alejos Husband, RN Outcome: Progressing   Problem: Nutrition: Goal: Adequate nutrition will be maintained 02/21/2024 0127 by Alejos Husband, RN Outcome: Progressing 02/20/2024 2238 by Alejos Husband, RN Outcome: Progressing   Problem: Coping: Goal: Level of anxiety will  decrease 02/21/2024 0127 by Alejos Husband, RN Outcome: Progressing 02/20/2024 2238 by Alejos Husband, RN Outcome: Progressing   Problem: Elimination: Goal: Will not experience complications related to bowel motility 02/21/2024 0127 by Alejos Husband, RN Outcome: Progressing 02/20/2024 2238 by Alejos Husband, RN Outcome: Progressing Goal: Will not experience complications related to urinary retention 02/21/2024 0127 by Alejos Husband, RN Outcome: Progressing 02/20/2024 2238 by Alejos Husband, RN Outcome: Progressing   Problem: Pain Managment: Goal: General experience of comfort will improve and/or be controlled 02/21/2024 0127 by Alejos Husband, RN Outcome: Progressing 02/20/2024 2238 by Alejos Husband, RN Outcome: Progressing   Problem: Safety: Goal: Ability to remain free from injury will improve 02/21/2024 0127 by Alejos Husband, RN Outcome: Progressing 02/20/2024 2238 by Alejos Husband, RN Outcome: Progressing   Problem: Skin Integrity: Goal: Risk for impaired skin integrity will decrease 02/21/2024 0127 by Alejos Husband, RN Outcome: Progressing 02/20/2024 2238 by Alejos Husband, RN Outcome: Progressing

## 2024-02-21 NOTE — Plan of Care (Signed)

## 2024-02-21 NOTE — Consult Note (Signed)
 Capacity Evaluation Note  1. Able to Understand Medical Problem: NO [X]  Observations: When asked about his medical condition, the patient responded, "I feel down the steps," but was unable to describe the injuries he sustained or their severity. He lacks understanding of his current impairments and demonstrates poor recall and insight into the events surrounding his hospitalization.  2. Able to Understand Proposed Treatment: NO [X]  Observations: When asked about the proposed plan of care, including discharge to a skilled nursing facility (SNF) for traumatic brain injury (TBI) rehabilitation, the patient stated, "Not a whole lot that I know of," indicating a lack of understanding regarding the level of care needed. He is unable to articulate any elements of the proposed discharge plan.  3. Able to Understand Alternatives to Proposed Treatment (if any): NO [X]  Observations: When asked about other options besides SNF placement, the patient replied, "There's a bunch of options. I don't know. The worse one would be to not come out of it." Responses were vague, nonspecific, and lacked a clear connection to medical decisions.  4. Able to Understand Option of Refusing Proposed Treatment (including withholding or withdrawing proposed treatment): NO [X]  Observations: Patient is unable to articulate what refusing SNF care would entail or the risks involved. Although he has reportedly stated a desire to go home with his girlfriend, he currently states, "I don't know. I haven't made a decision." Chart review reflects prior documentation that the girlfriend is unable to provide the level of care required. The patient lacks judgment and insight into the potential consequences of refusing skilled care.  5. Able to Appreciate Reasonably Foreseeable Consequences of Accepting Proposed Treatment: NO [X]  Observations: When asked about what he believes will happen if he accepts SNF placement, the patient stated, "I  don't know what they do," indicating he does not understand or appreciate the goals or benefits of receiving continued care in a specialized setting.  6. Able to Appreciate Reasonably Foreseeable Consequences of Refusing Proposed Treatment (including withholding or withdrawing proposed treatment): NO [X]  Observations: Patient cannot articulate what may happen if he declines SNF care. He lacks awareness of the severity of his medical and cognitive impairments and the need for structured support. He continues to respond with I don't know to most inquiries related to his care needs and safety.  7a. The Person's Decision is Affected by Depression: YES [X]  Observations: Patient endorses low mood and is noted to have an uneaten meal tray at bedside. He appears withdrawn and ambivalent about his treatment decisions. While he denies active suicidal ideation or plan, he shows signs of poor appetite, low motivation, and depressed mood, likely situational due to prolonged hospitalization and uncertain discharge plan. Mirtazapine 7.5 mg PO QHS is recommended to target mood and appetite.  7b. The Person's Decision is Affected by Delusion/Psychosis: NO [X]  Observations: There is no current evidence of delusions or psychosis interfering with the patient's decision-making capacity. Responses are disorganized at times but not psychotic in nature.  Conclusion: Patient lacks medical decision-making capacity regarding discharge planning and treatment needs, specifically related to refusal of SNF placement. He is unable to understand his condition, the proposed care plan, or the consequences of his decisions. Given the absence of a reliable surrogate decision-maker and significant cognitive and functional limitations, initiation of guardianship proceedings or identification of next of kin is recommended.  Mirtazapine 7.5 mg PO QHS was initiated to address the patient's symptoms of depressed mood and poor appetite  during prolonged hospitalization and pending discharge planning. On 02/17/2024,  the patient's QTc was 352 ms (corrected via Fredericia method), which is within normal limits, minimizing concern for QTc prolongation associated with mirtazapine. Baseline sodium was noted to be 134, and creatinine was elevated at 2.7, indicating underlying renal impairment. While mirtazapine is generally considered safe in renal dysfunction and has a lower risk of hyponatremia compared to SSRIs or SNRIs, continued monitoring is warranted. The patient will be reassessed in 2-3 days for tolerability, including monitoring for potential side effects such as increased sedation, agitation, or paradoxical excitability, which can occasionally occur in geriatric or cognitively impaired patients when starting serotonergic agents.  Psychiatry consult will continue to follow from a distance.

## 2024-02-21 NOTE — TOC Progression Note (Addendum)
 Transition of Care [facility]) - Progression Note    Patient Details  Name: [name] MRN: [id number] Date of Birth: June 29, 1951  [facility] CM/SW Contact  Alanea Woolridge E Marvel Sapp, LCSW Phone Number: 02/21/2024, 1:10 PM  Clinical Narrative:    Per Psych eval today 6/17- Patient lacks medical decision-making capacity regarding discharge planning and treatment needs, specifically related to refusal of SNF placement. He is unable to understand his condition, the proposed care plan, or the consequences of his decisions. Given the absence of a reliable surrogate decision-maker and significant cognitive and functional limitations, initiation of guardianship proceedings or identification of next of kin is recommended.  CSW attempted call to patient's sister Marily Shows) at both #s in chart. Left VM requesting a return call. CSW called patient's girlfriend Rice Chamorro). Rice Chamorro states to her knowledge, patient is widowed and does not have a living spouse. Rice Chamorro states patient has adult children but they have disowned patient. Rice Chamorro states she thinks Marily Shows is POA, Rice Chamorro will ask Marily Shows to call CSW back. Patient has another sister named Resa Cass does not have Kay's contact info.  Will need copy of POA paperwork. If not, will need to attempt to reach children since they are technically next of kin.    2:30- Call from patient's sister Marily Shows. Marily Shows states she does not have official POA paperwork and patient does not have a POA. Marily Shows confirms patient does not have a living spouse and does have 3 adult children who he is estranged from. Marily Shows states she will try to get in touch with patient's children to see if they would be willing to make decisions for patient and/or for CSW to reach out to them. Patient's sister states she feels patient does need STR at a SNF. Marily Shows states patient does not have any other friends/family that he could stay with other than his girlfriend Rice Chamorro) who states he cannot stay in her home  without going to STR first due to frequent falls. Marily Shows states all of patient's other family is in [place] and patient would not be able to stay with them.  TOC Supervisor updated.  Will send out for SNF once [facility] is out. PASRR is [phone] [id number].  Expected Discharge Plan: Skilled Nursing Facility Barriers to Discharge: Continued Medical Work up  Expected Discharge Plan and Services In-house Referral: Clinical Social Work     Living arrangements for the past 2 months: Single Family Home                                       Social Determinants of Health (SDOH) Interventions SDOH Screenings   Food Insecurity: No Food Insecurity (02/10/2024)  Housing: Low Risk  (02/10/2024)  Transportation Needs: No Transportation Needs (02/10/2024)  Utilities: Not At Risk (02/10/2024)  Social Connections: Moderately Integrated (02/10/2024)    Readmission Risk Interventions     No data to display

## 2024-02-22 ENCOUNTER — Encounter (HOSPITAL_COMMUNITY): Payer: Self-pay | Admitting: General Surgery

## 2024-02-22 ENCOUNTER — Inpatient Hospital Stay (HOSPITAL_COMMUNITY): Payer: Medicare (Managed Care) | Admitting: Anesthesiology

## 2024-02-22 ENCOUNTER — Encounter (HOSPITAL_COMMUNITY): Admission: EM | Disposition: A | Discharge status: Skilled Nursing Facility | Payer: Self-pay | Source: Home / Self Care

## 2024-02-22 DIAGNOSIS — Z7189 Other specified counseling: Secondary | ICD-10-CM | POA: Diagnosis not present

## 2024-02-22 DIAGNOSIS — Z515 Encounter for palliative care: Secondary | ICD-10-CM | POA: Diagnosis not present

## 2024-02-22 DIAGNOSIS — R1310 Dysphagia, unspecified: Secondary | ICD-10-CM

## 2024-02-22 DIAGNOSIS — I480 Paroxysmal atrial fibrillation: Secondary | ICD-10-CM

## 2024-02-22 HISTORY — PX: PEG PLACEMENT: SHX5437

## 2024-02-22 HISTORY — PX: ESOPHAGOGASTRODUODENOSCOPY: SHX5428

## 2024-02-22 LAB — GLUCOSE, CAPILLARY
Glucose-Capillary: 101 mg/dL — ABNORMAL HIGH (ref 70–99)
Glucose-Capillary: 94 mg/dL (ref 70–99)
Glucose-Capillary: 100 mg/dL — ABNORMAL HIGH (ref 70–99)
Glucose-Capillary: 174 mg/dL — ABNORMAL HIGH (ref 70–99)
Glucose-Capillary: 150 mg/dL — ABNORMAL HIGH (ref 70–99)
Glucose-Capillary: 153 mg/dL — ABNORMAL HIGH (ref 70–99)

## 2024-02-22 LAB — RENAL FUNCTION PANEL
Albumin: 2.4 g/dL — ABNORMAL LOW (ref 3.5–5.0)
Anion gap: 8 (ref 5–15)
BUN: 116 mg/dL — ABNORMAL HIGH (ref 8–23)
CO2: 18 mmol/L — ABNORMAL LOW (ref 22–32)
Calcium: 9.4 mg/dL (ref 8.9–10.3)
Chloride: 106 mmol/L (ref 98–111)
Creatinine, Ser: 2.84 mg/dL — ABNORMAL HIGH (ref 0.61–1.24)
GFR, Estimated: 23 mL/min — ABNORMAL LOW (ref >60)
Glucose, Bld: 99 mg/dL (ref 70–99)
Phosphorus: 4.7 mg/dL — ABNORMAL HIGH (ref 2.5–4.6)
Potassium: 5.4 mmol/L — ABNORMAL HIGH (ref 3.5–5.1)
Sodium: 132 mmol/L — ABNORMAL LOW (ref 135–145)

## 2024-02-22 SURGERY — EGD (ESOPHAGOGASTRODUODENOSCOPY)
Anesthesia: Monitor Anesthesia Care

## 2024-02-22 MED ORDER — MIRTAZAPINE 15 MG PO TBDP
7.5000 mg | ORAL_TABLET | Freq: Every day | ORAL | Status: DC
  Administered 2024-02-22 – 2024-03-01 (×9): 7.5 mg
  Filled 2024-02-22 (×10): qty 0.5

## 2024-02-22 MED ORDER — FENTANYL CITRATE (PF) 100 MCG/2ML IJ SOLN
INTRAMUSCULAR | Status: DC | PRN
  Administered 2024-02-22 (×2): 50 ug via INTRAVENOUS

## 2024-02-22 MED ORDER — METOPROLOL TARTRATE 25 MG/10 ML ORAL SUSPENSION
12.5000 mg | Freq: Two times a day (BID) | ORAL | Status: DC
  Administered 2024-02-22 – 2024-03-01 (×18): 12.5 mg
  Filled 2024-02-22 (×20): qty 5

## 2024-02-22 MED ORDER — FAMOTIDINE 20 MG PO TABS
10.0000 mg | ORAL_TABLET | Freq: Every day | ORAL | Status: DC
  Administered 2024-02-22 – 2024-03-01 (×9): 10 mg
  Filled 2024-02-22 (×8): qty 1

## 2024-02-22 MED ORDER — LIDOCAINE 2% (20 MG/ML) 5 ML SYRINGE
INTRAMUSCULAR | Status: DC | PRN
  Administered 2024-02-22: 60 mg via INTRAVENOUS

## 2024-02-22 MED ORDER — PROPOFOL 10 MG/ML IV BOLUS
INTRAVENOUS | Status: DC | PRN
  Administered 2024-02-22: 100 mg via INTRAVENOUS

## 2024-02-22 MED ORDER — PHENYLEPHRINE 80 MCG/ML (10ML) SYRINGE FOR IV PUSH (FOR BLOOD PRESSURE SUPPORT)
PREFILLED_SYRINGE | INTRAVENOUS | Status: DC | PRN
  Administered 2024-02-22: 160 ug via INTRAVENOUS
  Administered 2024-02-22 (×2): 80 ug via INTRAVENOUS

## 2024-02-22 MED ORDER — POLYETHYLENE GLYCOL 3350 17 G PO PACK
17.0000 g | PACK | Freq: Every day | ORAL | Status: DC
  Administered 2024-02-22 – 2024-03-01 (×5): 17 g
  Filled 2024-02-22 (×7): qty 1

## 2024-02-22 MED ORDER — ROCURONIUM BROMIDE 10 MG/ML (PF) SYRINGE
PREFILLED_SYRINGE | INTRAVENOUS | Status: DC | PRN
  Administered 2024-02-22: 50 mg via INTRAVENOUS

## 2024-02-22 MED ORDER — FOLIC ACID 1 MG PO TABS
1.0000 mg | ORAL_TABLET | Freq: Every day | ORAL | Status: DC
  Administered 2024-02-22 – 2024-03-01 (×9): 1 mg
  Filled 2024-02-22 (×8): qty 1

## 2024-02-22 MED ORDER — LEVOTHYROXINE SODIUM 25 MCG PO TABS
25.0000 ug | ORAL_TABLET | Freq: Every day | ORAL | Status: DC
  Administered 2024-02-23 – 2024-03-02 (×9): 25 ug
  Filled 2024-02-22 (×9): qty 1

## 2024-02-22 MED ORDER — SUGAMMADEX SODIUM 200 MG/2ML IV SOLN
INTRAVENOUS | Status: DC | PRN
  Administered 2024-02-22: 200 mg via INTRAVENOUS

## 2024-02-22 MED ORDER — DOCUSATE SODIUM 50 MG/5ML PO LIQD
100.0000 mg | Freq: Two times a day (BID) | ORAL | Status: DC
  Administered 2024-02-22 – 2024-03-01 (×16): 100 mg
  Filled 2024-02-22 (×16): qty 10

## 2024-02-22 MED ORDER — VITAMIN C 500 MG PO TABS
500.0000 mg | ORAL_TABLET | Freq: Three times a day (TID) | ORAL | Status: DC
  Administered 2024-02-22 – 2024-03-01 (×26): 500 mg
  Filled 2024-02-22 (×25): qty 1

## 2024-02-22 MED ORDER — SODIUM CHLORIDE 0.9 % IV SOLN: INTRAVENOUS | Status: DC | PRN

## 2024-02-22 MED ORDER — ADULT MULTIVITAMIN W/MINERALS CH
1.0000 | ORAL_TABLET | Freq: Every day | ORAL | Status: DC
  Administered 2024-02-22 – 2024-03-01 (×9): 1
  Filled 2024-02-22 (×8): qty 1

## 2024-02-22 MED ORDER — FENTANYL CITRATE (PF) 100 MCG/2ML IJ SOLN
INTRAMUSCULAR | Status: AC
  Filled 2024-02-22: qty 2

## 2024-02-22 MED ORDER — ALBUMIN HUMAN 5 % IV SOLN: INTRAVENOUS | Status: DC | PRN

## 2024-02-22 MED ORDER — THIAMINE MONONITRATE 100 MG PO TABS
100.0000 mg | ORAL_TABLET | Freq: Every day | ORAL | Status: DC
  Administered 2024-02-22 – 2024-03-01 (×9): 100 mg
  Filled 2024-02-22 (×8): qty 1

## 2024-02-22 MED ORDER — DEXAMETHASONE SODIUM PHOSPHATE 10 MG/ML IJ SOLN
INTRAMUSCULAR | Status: DC | PRN
Start: 2024-02-22 — End: 2024-02-22
  Administered 2024-02-22: 5 mg via INTRAVENOUS

## 2024-02-22 MED ORDER — ONDANSETRON HCL 4 MG/2ML IJ SOLN
INTRAMUSCULAR | Status: DC | PRN
Start: 2024-02-22 — End: 2024-02-22
  Administered 2024-02-22: 4 mg via INTRAVENOUS

## 2024-02-22 MED ORDER — DEXMEDETOMIDINE HCL IN NACL 80 MCG/20ML IV SOLN
INTRAVENOUS | Status: AC
Start: 2024-02-22 — End: 2024-02-22
  Filled 2024-02-22: qty 20

## 2024-02-22 NOTE — Anesthesia Postprocedure Evaluation (Signed)
 Anesthesia Post Note  Patient: [name]  Procedure(s) Performed: EGD (ESOPHAGOGASTRODUODENOSCOPY) INSERTION, PEG TUBE     Patient location during evaluation: PACU Anesthesia Type: General Level of consciousness: awake and alert and oriented Pain management: pain level controlled Vital Signs Assessment: post-procedure vital signs reviewed and stable Respiratory status: spontaneous breathing, nonlabored ventilation and respiratory function stable Cardiovascular status: blood pressure returned to baseline and stable Postop Assessment: no apparent nausea or vomiting Anesthetic complications: no   No notable events documented.  Last Vitals:  Vitals:   02/22/24 1125 02/22/24 1140  BP: 105/63 111/69  Pulse: (!) 106 (!) 106  Resp: 15 18  Temp:  36.5 C  SpO2: 96% 94%    Last Pain:  Vitals:   02/22/24 1319  TempSrc:   PainSc: 5                  [name] A.

## 2024-02-22 NOTE — Progress Notes (Signed)
 Pt off unit to Endo for a procedure. Pt transported off unit via stretcher. P. Amo Lillien Petronio RN

## 2024-02-22 NOTE — Anesthesia Procedure Notes (Addendum)
 Procedure Name: Intubation Date/Time: 02/22/2024 10:21 AM  Performed by: Deneice Finland, CRNAPre-anesthesia Checklist: Patient identified, Emergency Drugs available, Suction available and Patient being monitored Patient Re-evaluated:Patient Re-evaluated prior to induction Oxygen Delivery Method: Circle System Utilized Preoxygenation: Pre-oxygenation with 100% oxygen Induction Type: IV induction Ventilation: Mask ventilation without difficulty Laryngoscope Size: Mac and 3 Tube type: Oral Number of attempts: 1 Airway Equipment and Method: Stylet and Oral airway Placement Confirmation: ETT inserted through vocal cords under direct vision, positive ETCO2 and breath sounds checked- equal and bilateral Secured at: 21 cm Tube secured with: Tape Dental Injury: Teeth and Oropharynx as per pre-operative assessment

## 2024-02-22 NOTE — Progress Notes (Signed)
 Physical Therapy Treatment Patient Details Name: [name] MRN: [id number] DOB: 1950/09/21 Today's Date: 02/22/2024   History of Present Illness 73 yo male presenting to ED 5/28 after being found seizing after falling down stairs. CTH bil SDH and SAH with trace IVH and non-depressed occipital skull fx. Intubated 5/28, self-extubated 5/30. Recently sustained bilateral rib fxs after a fall 5/17. Imaging also showed stable subacute fractures involving the right sacral ala, right superior and inferior rami, and left parasymphyseal region along with stable superior endplate compression deformities at T3, T4, and T5 since 01/21/2024. S/p EGD and PEG tube placement 6/18. PMH includes ETOH abuse s/p two liver transplants, 5/17 fall with R sacral ala superior and inferior rami fx, L superior endplate compression deformities at T3 T4 T5    PT Comments  The pt was able to progress to ambulating an increased distance of up to ~110 ft with a RW and CGA-minA today. However, he remains at risk for falls, displaying deficits in endurance, balance, and generalized strength. He needs repeated cues to take longer steps, clear his feet, stand upright, look forward, and widen his stance. He is unaware of his deficits that impact his safety, reporting he is going to d/c home, even though educated pt that he needs to be more independent before returning home at this time. Will continue to follow acutely.    If plan is discharge home, recommend the following: Assistance with cooking/housework;Assist for transportation;Help with stairs or ramp for entrance;Supervision due to cognitive status;A little help with walking and/or transfers;A little help with bathing/dressing/bathroom;Direct supervision/assist for medications management;Direct supervision/assist for financial management   Can travel by private vehicle     Yes  Equipment Recommendations  Rolling walker (2 wheels);BSC/3in1;Wheelchair (measurements PT)     Recommendations for Other Services       Precautions / Restrictions Precautions Precautions: Fall Recall of Precautions/Restrictions: Impaired Precaution/Restrictions Comments: PEG tube Restrictions Weight Bearing Restrictions Per Provider Order: No Other Position/Activity Restrictions: Dr. Aniceto Barley confirmed no spinal precautions or weight bearing restrictions 5/31     Mobility  Bed Mobility Overal bed mobility: Needs Assistance Bed Mobility: Supine to Sit, Sit to Supine     Supine to sit: Supervision, HOB elevated, Used rails Sit to supine: Supervision, HOB elevated, Used rails   General bed mobility comments: Supervision and extra time to transition supine <> sit L EOB from elevated HOB. Pt able to scoot superiorly in bed with supervision and extra time and cuing    Transfers Overall transfer level: Needs assistance Equipment used: Rolling walker (2 wheels) Transfers: Sit to/from Stand Sit to Stand: Contact guard assist           General transfer comment: CGA for safety standing from EOB to RW    Ambulation/Gait Ambulation/Gait assistance: Contact guard assist, Min assist Gait Distance (Feet): 110 Feet Assistive device: Rolling walker (2 wheels) Gait Pattern/deviations: Decreased stride length, Step-through pattern, Decreased step length - right, Decreased step length - left, Trunk flexed, Narrow base of support Gait velocity: reduced Gait velocity interpretation: <1.31 ft/sec, indicative of household ambulator   General Gait Details: Pt takes slow, small, narrow steps while maintaining a kyphotic posture and inferior gaze. Verbal and tactile cues provided to look superiorly and improve upright posture. Visual, verbal, and tactile cues provided to widen stance. VCs provided to increase stride length and feet clearance. Momentary success noted with cuing. CGA-minA for balance and safety throughout. Pt needed extra time to turn around   Stairs  Wheelchair Mobility     Tilt Bed    Modified Rankin (Stroke Patients Only)       Balance Overall balance assessment: Needs assistance Sitting-balance support: No upper extremity supported, Feet supported Sitting balance-Leahy Scale: Good     Standing balance support: Bilateral upper extremity supported, During functional activity, Reliant on assistive device for balance Standing balance-Leahy Scale: Poor Standing balance comment: reliant on RW                            Communication Communication Communication: Impaired Factors Affecting Communication: Hearing impaired  Cognition Arousal: Alert Behavior During Therapy: Flat affect   PT - Cognitive impairments: Awareness, Memory, Attention, Initiation, Safety/Judgement, Problem solving                   Rancho Levels of Cognitive Functioning Rancho Los Amigos Scales of Cognitive Functioning: Automatic, Appropriate: Minimal Assistance for Daily Living Skills Rancho Los Amigos Scales of Cognitive Functioning: Automatic, Appropriate: Minimal Assistance for Daily Living Skills [VII] PT - Cognition Comments: Pt perseverating on being told he could d/c home, not sure by who since it appears that the plan is for SNF per chart. Poor awareness of his deficits that impact his safety. Pt forgetting that he wanted the male purewick reconnected then later asking for urinal. Following commands: Impaired Following commands impaired: Only follows one step commands consistently, Follows one step commands with increased time    Cueing Cueing Techniques: Verbal cues, Tactile cues, Visual cues  Exercises      General Comments        Pertinent Vitals/Pain Pain Assessment Pain Assessment: Faces Faces Pain Scale: Hurts a little bit Pain Location: generalized Pain Descriptors / Indicators: Discomfort, Grimacing Pain Intervention(s): Limited activity within patient's tolerance, Monitored during session, Repositioned     Home Living                          Prior Function            PT Goals (current goals can now be found in the care plan section) Acute Rehab PT Goals Patient Stated Goal: to go home PT Goal Formulation: With patient Time For Goal Achievement: 03/01/24 Potential to Achieve Goals: Good Progress towards PT goals: Progressing toward goals    Frequency    Min 3X/week      PT Plan      Co-evaluation              AM-PAC PT 6 Clicks Mobility   Outcome Measure  Help needed turning from your back to your side while in a flat bed without using bedrails?: A Little Help needed moving from lying on your back to sitting on the side of a flat bed without using bedrails?: A Little Help needed moving to and from a bed to a chair (including a wheelchair)?: A Little Help needed standing up from a chair using your arms (e.g., wheelchair or bedside chair)?: A Little Help needed to walk in hospital room?: A Little Help needed climbing 3-5 steps with a railing? : A Lot 6 Click Score: 17    End of Session Equipment Utilized During Treatment: Gait belt Activity Tolerance: Patient tolerated treatment well Patient left: in bed;with call bell/phone within reach;with bed alarm set;with nursing/sitter in room Nurse Communication: Mobility status;Other (comment) (coordinated with RN for RN to replace fall matt once done managing PEG and standing at that  side of the bed) PT Visit Diagnosis: Unsteadiness on feet (R26.81);Muscle weakness (generalized) (M62.81);Other symptoms and signs involving the nervous system (R29.898);Other abnormalities of gait and mobility (R26.89);Difficulty in walking, not elsewhere classified (R26.2)     Time: 1610-9604 PT Time Calculation (min) (ACUTE ONLY): 18 min  Charges:    $Gait Training: 8-22 mins PT General Charges $$ ACUTE PT VISIT: 1 Visit                     Vernida Goodie, PT, DPT Acute Rehabilitation Services  Office:  [phone]    Ellyn Hack 02/22/2024, 2:57 PM

## 2024-02-22 NOTE — Anesthesia Preprocedure Evaluation (Addendum)
 Anesthesia Evaluation  Patient identified by MRN, date of birth, ID band Patient awake    Reviewed: Allergy & Precautions, NPO status , Patient's Chart, lab work & pertinent test results  Airway Mallampati: II  TM Distance: >3 FB     Dental  (+) Poor Dentition, Dental Advisory Given, Missing, Chipped, Loose   Pulmonary  Hx/o bilateral rib Fx's from fall 5/17 Small right pleural effusion    Pulmonary exam normal breath sounds clear to auscultation       Cardiovascular negative cardio ROS Normal cardiovascular exam Rhythm:Regular Rate:Normal  Echo 02/08/24 1. Left ventricular ejection fraction, by estimation, is 70 to 75%. The  left ventricle has hyperdynamic function. The left ventricle has no  regional wall motion abnormalities. Left ventricular diastolic parameters  are indeterminate.   2. Right ventricular systolic function is normal. The right ventricular  size is normal.   3. The mitral valve is normal in structure. No evidence of mitral valve  regurgitation. No evidence of mitral stenosis.   4. The aortic valve is tricuspid. There is moderate calcification of the  aortic valve. Aortic valve regurgitation is trivial. Mild aortic valve  stenosis. Aortic valve area, by VTI measures 2.03 cm. Aortic valve mean  gradient measures 8.0 mmHg. Aortic  valve Vmax measures 1.95 m/s.   5. The inferior vena cava is normal in size with greater than 50%  respiratory variability, suggesting right atrial pressure of 3 mmHg.   EKG 02/17/24 ST 111/min, otherwise normal   Neuro/Psych Seizures -, Well Controlled,  Hx/o ETOH withdrawal TBI Hx/o bilateral SDH treated medically  CT scan 6/3 1. Increasing edema in the frontal lobes bilaterally, compatible with evolving contusions. 2. Similar overlying subarachnoid hemorrhage. 3. Similar 7 mm thick bilateral subdural hemorrhages with hemorrhage tracking along the falx. 4. Similar occipital  calvarial fracture.   negative psych ROS   GI/Hepatic ,,,(+)     substance abuse  alcohol  useHx/o liver transplant- on Prograf  Dysphagia   Endo/Other  negative endocrine ROS    Renal/GU Renal InsufficiencyRenal diseaseK+ 5.4 this am  negative genitourinary   Musculoskeletal negative musculoskeletal ROS (+)  Right lateral 3rd - 8th rib Fx Left 5th rib Fx- non displaced   Abdominal   Peds  Hematology negative hematology ROS (+)   Anesthesia Other Findings   Reproductive/Obstetrics                             Anesthesia Physical Anesthesia Plan  ASA: 3  Anesthesia Plan: MAC and General   Post-op Pain Management: Minimal or no pain anticipated   Induction: Intravenous  PONV Risk Score and Plan: 4 or greater and Treatment may vary due to age or medical condition  Airway Management Planned: Oral ETT  Additional Equipment: None  Intra-op Plan:   Post-operative Plan: Extubation in OR  Informed Consent: I have reviewed the patients History and Physical, chart, labs and discussed the procedure including the risks, benefits and alternatives for the proposed anesthesia with the patient or authorized representative who has indicated his/her understanding and acceptance.     Dental advisory given  Plan Discussed with: CRNA and Anesthesiologist  Anesthesia Plan Comments:         Anesthesia Quick Evaluation

## 2024-02-22 NOTE — Progress Notes (Signed)
 Palliative Medicine Inpatient Follow Up Note HPI: 73 yo male presenting to ED 5/28 after being found seizing after falling down stairs. CTH bil SDH and SAH with trace IVH and non-depressed occipital skull fx. Intubated 5/28, self-extubated 5/30.  Palliative care has been asked to get involved to address goals of care in [name] setting of chronic disease, adult failure to thrive, and muscular deconditioning.   Today's Discussion 02/22/2024  *Please note that this is a verbal dictation therefore any spelling or grammatical errors are due to [name] Dragon [employer] One system interpretation.  Chart reviewed inclusive of vital signs, progress notes, laboratory results, and diagnostic images.   I met with Drexel Gentles at bedside this afternoon. He is lying in bed in NAD. He denies pain at his g-tube site. He denies nausea as well as shortness of breath. He is aware of self and place though he done not quite comprehend situation. Has poor attention when being spoken to.   I called and spoke with patients sister, Marily Shows this afternoon. We discussed that patient received [name] PEG and appears to have tolerated [name] procedure well. Created space and opportunity for patients sister to explore thoughts feelings and fears regarding [name]'s current medical situation. She shares disappointment that he has had two liver transplants and from her perspective has been given multiple chances at life. She feels that he has neglected himself and his current situation is sadly his own doing. She and I discussed [name] need for official documents moving forward to better assert what [name] [name]'s wishes are. Marily Shows shares that [name] family is reaching out to a lawyer to aid in supporting completion of advanced directives and POA/last will and testament.   Marily Shows asks what to expect moving forward and is worried that [name] [name] will not like [name] idea of living somewhere though has proven there is not other safe place for him.   We reviewed allowing time  for outcomes.   Questions and concerns addressed.  Objective Assessment: Vital Signs Vitals:   02/22/24 1125 02/22/24 1140  BP: 105/63 111/69  Pulse: (!) 106 (!) 106  Resp: 15 18  Temp:  97.7 F (36.5 C)  SpO2: 96% 94%    Intake/Output Summary (Last 24 hours) at 02/22/2024 1330 Last data filed at 02/22/2024 1049 Gross per 24 hour  Intake 350 ml  Output 1100 ml  Net -750 ml   Last Weight  Most recent update: 02/22/2024  9:52 AM    Weight  57 kg (125 lb 10.6 oz)            Gen:  [name] chronically ill in appearance HEENT: dry mucous membranes CV: irregular rate and regular rhythm  PULM:  On RA, breathing is even and nonlabored ABD: soft/nontender  EXT: No edema  Neuro: Oriented to self and place  SUMMARY OF RECOMMENDATIONS   Full Code / Full scope of care at this time   S/P PEG placement  Patients oldest son, Bakari and sister, Marily Shows have been appointed by family to make decisions --> Patients family are speaking to lawyers to create official documents   MSW is involved in patients case   Plan for ongoing Palliative support ______________________________________________________________________________________ Camille Cedars [facility] Palliative Medicine Team Team Cell Phone: [phone] Please utilize secure chat with additional questions, if there is no response within 30 minutes please call [name] above phone number  Time Spent: 50  Palliative Medicine Team providers are available by phone from 7am to 7pm daily and can be reached  through [name] team cell phone.  Should this patient require assistance outside of these hours, please call [name] patient's attending physician.

## 2024-02-22 NOTE — Transfer of Care (Signed)
 Immediate Anesthesia Transfer of Care Note  Patient: [name]  Procedure(s) Performed: EGD (ESOPHAGOGASTRODUODENOSCOPY) INSERTION, PEG TUBE  Patient Location: PACU  Anesthesia Type:General  Level of Consciousness: awake, alert , and oriented  Airway & Oxygen Therapy: Patient Spontanous Breathing and Patient connected to face mask oxygen  Post-op Assessment: Report given to RN and Post -op Vital signs reviewed and stable  Post vital signs: Reviewed and stable  Last Vitals:  Vitals Value Taken Time  BP 125/83 02/22/24 10:56  Temp    Pulse 108 02/22/24 11:00  Resp 20 02/22/24 11:00  SpO2 100 % 02/22/24 11:00  Vitals shown include unfiled device data.  Last Pain:  Vitals:   02/22/24 0951  TempSrc: Temporal  PainSc: 0-No pain      Patients Stated Pain Goal: 2 (02/20/24 2000)  Complications: No notable events documented.

## 2024-02-22 NOTE — Progress Notes (Signed)
 SLP Cancellation Note  Patient Details Name: [name] MRN: [id number] DOB: 06-20-1951   Cancelled treatment:       Reason Eval/Treat Not Completed: Patient at procedure or test/unavailable (off the unit for PEG placement). SLP will continue following.    Amil Kale, M.A., CCC-SLP Speech Language Pathology, Acute Rehabilitation Services  Secure Chat preferred [phone]  02/22/2024, 9:43 AM

## 2024-02-22 NOTE — Progress Notes (Signed)
 Pt back from Endo procedure. Pt alert and verbally responsive. VSS, telemetry re-verified. PEG tube intact with split gauze. Pt in bed with call light within reach and bed alarm on. Lorrene Rosser RN   02/22/24 1140  Vitals  Temp 97.7 F (36.5 C)  Temp Source Oral  BP 111/69  MAP (mmHg) 81  BP Location Right Arm  BP Method Automatic  Pulse Rate (!) 106  Pulse Rate Source Monitor  ECG Heart Rate (!) 106  Resp 18  Level of Consciousness  Level of Consciousness Alert  MEWS COLOR  MEWS Score Color Green  Oxygen Therapy  SpO2 94 %  O2 Device Room Air  MEWS Score  MEWS Temp 0  MEWS Systolic 0  MEWS Pulse 1  MEWS RR 0  MEWS LOC 0  MEWS Score 1

## 2024-02-22 NOTE — Progress Notes (Signed)
 PT Cancellation Note  Patient Details Name: [name] MRN: [id number] DOB: 12-01-1950   Cancelled Treatment:    Reason Eval/Treat Not Completed: (P) Patient at procedure or test/unavailable. Will plan to follow-up later as time permits.   Vernida Goodie, PT, DPT Acute Rehabilitation Services  Office: [phone]    Ellyn Hack 02/22/2024, 10:21 AM

## 2024-02-22 NOTE — Progress Notes (Signed)
 Trauma/Critical Care Follow Up Note  Subjective:    Overnight Issues:   Objective:  Vital signs for last 24 hours: Temp:  [97.7 F (36.5 C)-98.5 F (36.9 C)] 97.9 F (36.6 C) (06/18 0313) Pulse Rate:  [84-103] 95 (06/18 0313) Resp:  [14-16] 14 (06/18 0313) BP: (95-123)/(61-69) 123/66 (06/18 0313) SpO2:  [96 %-99 %] 97 % (06/18 0313) Weight:  [57 kg] 57 kg (06/18 0313)  Hemodynamic parameters for last 24 hours:    Intake/Output from previous day: 06/17 0701 - 06/18 0700 In: -  Out: 1100 [Urine:1100]  Intake/Output this shift: No intake/output data recorded.  Vent settings for last 24 hours:    Physical Exam:  Gen: comfortable, no distress Neuro: follows commands, alert, communicative HEENT: PERRL Neck: supple CV: RRR Pulm: unlabored breathing on RA Abd: soft, NT  , no recent BM GU: urine clear and yellow, +spontaneous voids Extr: wwp, no edema  Results for orders placed or performed during the hospital encounter of 02/01/24 (from the past 24 hours)  Glucose, capillary     Status: Abnormal   Collection Time: 02/21/24 12:03 PM  Result Value Ref Range   Glucose-Capillary 136 (H) 70 - 99 mg/dL  Glucose, capillary     Status: Abnormal   Collection Time: 02/21/24  3:51 PM  Result Value Ref Range   Glucose-Capillary 135 (H) 70 - 99 mg/dL  Glucose, capillary     Status: Abnormal   Collection Time: 02/21/24  7:18 PM  Result Value Ref Range   Glucose-Capillary 110 (H) 70 - 99 mg/dL  Glucose, capillary     Status: Abnormal   Collection Time: 02/21/24 10:59 PM  Result Value Ref Range   Glucose-Capillary 106 (H) 70 - 99 mg/dL  Glucose, capillary     Status: Abnormal   Collection Time: 02/22/24  3:13 AM  Result Value Ref Range   Glucose-Capillary 101 (H) 70 - 99 mg/dL  Renal function panel     Status: Abnormal   Collection Time: 02/22/24  5:02 AM  Result Value Ref Range   Sodium 132 (L) 135 - 145 mmol/L   Potassium 5.4 (H) 3.5 - 5.1 mmol/L   Chloride 106 98 -  111 mmol/L   CO2 18 (L) 22 - 32 mmol/L   Glucose, Bld 99 70 - 99 mg/dL   BUN 409 (H) 8 - 23 mg/dL   Creatinine, Ser 8.11 (H) 0.61 - 1.24 mg/dL   Calcium 9.4 8.9 - 91.4 mg/dL   Phosphorus 4.7 (H) 2.5 - 4.6 mg/dL   Albumin 2.4 (L) 3.5 - 5.0 g/dL   GFR, Estimated 23 (L) >60 mL/min   Anion gap 8 5 - 15    Assessment & Plan:  Present on Admission:  Subdural hematoma (HCC)    LOS: 21 days   Additional comments:I reviewed the patient's new clinical lab test results.   and I reviewed the patients new imaging test results.     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 Rochelle Chu  Loaded with Keppra  in ED, completed 1 week BID keppra , F/U CT head slightly increased SDH but exam stable. TBI therapies.  Bilateral rib fractures that were sustained in a fall on 01/21/2024, similar on follow-up CT scan - multimodal pain control, pulm toilet,  Acute hypoxic respiratory failure - self-extubated, weaned back to room air, continue flutter, chest PT, duonebs EtoH abuse and withdrawal - CIWA. MVI, thiamine , folic acid . No longer requiring  dex or ativan . Tachycardia - Cards has seen and signed off. Cont metoprolol .  FEN: now on reg diet with thin liquids, encouraged PO, TF HS until can take in enough CKD/AKI - appreciate Nephrology F/U  VTE: PAS, SQH Endo: low dose LT4 ID: None Foley: none Dispo: 4NP, therapies. Eval by psych yesterday and deemed not to have capacity for decision-making regarding PEG placement and SNF placement. He has limited recollection of the extensive conversation held yesterday regarding these two topics. Palliative also engaged. No HCPOA, legal spouse identified. Patient has three adult children, Dorrene Gaucher, and Orvil Bland reportedly reachable by patient's sister. Requested contact information for them this AM, not provided, but unit phone number provided to sister  to pass along to them.   Anda Bamberg, MD Trauma & General Surgery Please use [email or web address] to contact on call provider  02/22/2024  *Care during the described time interval was provided by me. I have reviewed this patient's available data, including medical history, events of note, physical examination and test results as part of my evaluation.

## 2024-02-22 NOTE — Op Note (Signed)
   Procedure Note  Date: 02/22/2024  Procedure: esophagogastroduodenoscopy (EGD) and percutaneous endoscopic gastrostomy (PEG) tube placement  Pre-op diagnosis: dysphagia, malnutrition, failure to thrive Post-op diagnosis: same  Indication and clinical history: 79M with dysphagia, malnutrition, failure to thrive  Surgeon: Anda Bamberg, MD  Anesthesia: GETA  Findings:  Specimen: none EBL: <5cc Drains/Implants: PEG tube, 1.5cm at the skin   Disposition: ICU/PACU  Description of Procedure: The patient was positioned semi-recumbent. Time-out was performed verifying correct patient, procedure, signature of informed consent, and pre-operative antibiotics as indicated. MAC induction was uneventful and a bite block was placed into the oropharynx. The endoscope was inserted into the oropharynx and advanced down the esophagus into the stomach and into the duodenum. The visualized esophagus and duodenum were unremarkable. The endoscope was retracted back into the stomach and the stomach was insufflated. The stomach was inspected and was also normal. Transillumination was performed. The light was visible on the external skin and dimpling of the stomach was noted endoscopically with manual pressure. The abdomen was prepped and draped in the usual sterile fashion. Transillumination and dimpling were repeated and local anesthetic was infiltrated to make a skin wheal at the site of transillumination. The needle was inserted perpendicularly to the skin and the tip of the needle was visualized endoscopically. As the needle was retracted, the tract was also anesthetized. A skin nick was made at the site of the wheal and an introducer needle and sheath were inserted. The needle was removed and guidewire inserted. The guidewire was grasped by an endoscopic snare and the snare, guidewire, and endoscope retracted out of the oropharynx. The PEG tube was secured to the guidewire and retracted through the mouth and  esophagus into the stomach. The PEG tube was secured with a bolster and was visualized endoscopically to spin freely circumferentially and also be without gaps between the internal bumper and the stomach wall. There was no evidence of bleeding. The PEG bolster was secured at 1.5cm at the skin and there were no gaps between the bolster and the abdominal wall. The stomach was desufflated endoscopically and the endoscope removed. The bite block was also removed. The patient tolerated the procedure well and there were no complications.   The patient may have water  and medications administered via the PEG tube beginning immediately and tube feeds may be initiated four hours post-procedure.    Anda Bamberg, MD General and Trauma Surgery [facility] Surgery

## 2024-02-22 NOTE — Progress Notes (Signed)
 Orthopedic Tech Progress Note Patient Details:  [name] 1951-05-17 [id number]  Ortho Devices Type of Ortho Device: Abdominal binder Ortho Device/Splint Location: STOMACH Ortho Device/Splint Interventions: Ordered, Other (comment)RN called requesting an ABDOMINAL BINDER, left at bedside    Post Interventions Patient Tolerated: Well Instructions Provided: Care of device  [name] 02/22/2024, 2:47 PM

## 2024-02-22 NOTE — Progress Notes (Signed)
 Care discussed with patient's three children: Eitan [phone]), Jimmy [phone]), and Kathaleen Pale [phone]). Jimmy elects to give his decision-making rights to [place]. Christy elects to give her decision-making right to Marily Shows (sister, [phone]). Discussion held again with Jospeh and Marily Shows. Marily Shows accepts decision-making rights. Informed consent obtained from both regarding PEG placement. Also discussed with all parties that legal HC-POA should be considered.   Anda Bamberg, MD General and Trauma Surgery [facility] Surgery

## 2024-02-23 LAB — RENAL FUNCTION PANEL
Albumin: 2.8 g/dL — ABNORMAL LOW (ref 3.5–5.0)
Anion gap: 10 (ref 5–15)
BUN: 133 mg/dL — ABNORMAL HIGH (ref 8–23)
CO2: 20 mmol/L — ABNORMAL LOW (ref 22–32)
Calcium: 9.8 mg/dL (ref 8.9–10.3)
Chloride: 105 mmol/L (ref 98–111)
Creatinine, Ser: 2.87 mg/dL — ABNORMAL HIGH (ref 0.61–1.24)
GFR, Estimated: 23 mL/min — ABNORMAL LOW (ref >60)
Glucose, Bld: 108 mg/dL — ABNORMAL HIGH (ref 70–99)
Phosphorus: 3 mg/dL (ref 2.5–4.6)
Potassium: 5.7 mmol/L — ABNORMAL HIGH (ref 3.5–5.1)
Sodium: 135 mmol/L (ref 135–145)

## 2024-02-23 LAB — GLUCOSE, CAPILLARY
Glucose-Capillary: 124 mg/dL — ABNORMAL HIGH (ref 70–99)
Glucose-Capillary: 111 mg/dL — ABNORMAL HIGH (ref 70–99)
Glucose-Capillary: 108 mg/dL — ABNORMAL HIGH (ref 70–99)
Glucose-Capillary: 102 mg/dL — ABNORMAL HIGH (ref 70–99)
Glucose-Capillary: 112 mg/dL — ABNORMAL HIGH (ref 70–99)
Glucose-Capillary: 101 mg/dL — ABNORMAL HIGH (ref 70–99)

## 2024-02-23 MED ORDER — FERROUS SULFATE 300 (60 FE) MG/5ML PO SOLN
300.0000 mg | Freq: Three times a day (TID) | ORAL | Status: DC
  Administered 2024-02-23 – 2024-03-01 (×20): 300 mg
  Filled 2024-02-23 (×26): qty 5

## 2024-02-23 MED ORDER — SODIUM ZIRCONIUM CYCLOSILICATE 10 G PO PACK
10.0000 g | PACK | Freq: Once | ORAL | Status: AC
  Administered 2024-02-23: 10 g
  Filled 2024-02-23: qty 1

## 2024-02-23 MED ORDER — TACROLIMUS 1 MG/ML ORAL SUSPENSION
1.0000 mg | Freq: Two times a day (BID) | ORAL | Status: DC
  Administered 2024-02-23 – 2024-03-01 (×15): 1 mg
  Filled 2024-02-23 (×17): qty 1

## 2024-02-23 NOTE — Progress Notes (Addendum)
 Speech Language Pathology Treatment: Dysphagia;Cognitive-Linguistic  Patient Details Name: [name] MRN: [id number] DOB: 07-31-1951 Today's Date: 02/23/2024 Time: 4098-1191 SLP Time Calculation (min) (ACUTE ONLY): 23 min  Assessment / Plan / Recommendation Clinical Impression  Pt's diet order was noted to be entered as NPO s/p PEG placement, although TF were initiated. Discussed with PA and resumed diet order of regular solids and thin liquids. He was positioned upright in the chair and provided trials of thin liquids and regular solids, which he fed himself in limited quantities without overt s/s of dysphagia or aspiration. Recommend he continue current diet with meds given crushed with puree or via G-tube when able.   Pt's presentation continues to be consistent with [place] Level VII (automatic, appropriate). He also participated in a verbal reasoning task, for which he needed Mod cueing for planning, sequencing, and organization of thought. He was disoriented to time throughout the session but was able to use environmental cues to effectively reorient himself. Pt repeatedly asked to go home but was unable to provide reasoning into the ways in which he may need assistance. He demonstrates impaired awareness and judgement, making completion of ADLs without supervision unsafe. Ongoing SLP f/u is not necessary for swallowing as pt has met all goals but will continue following to target executive functioning.    HPI HPI: [name] is a 73 yo male presenting to ED 5/28 after being found seizing after falling down stairs. CTH shows bilateral SDH and SAH with trace IVH and non-depressed occipital skull fx. Intubated 5/28, self-extubated 5/30. Recently sustained bilateral rib fxs after a fall 5/17. MBS 5/30 showed moderate pharyngeal dysphagia which was thought to be partially chronic in nature. There was silent aspiration as pharyngeal residue progressed past the vocal folds. Underwent  endoscopy and PEG placement 6/18. PMH includes ETOH abuse s/p two liver transplants      SLP Plan  Goals updated          Recommendations  Diet recommendations: Regular;Thin liquid Liquids provided via: Cup;Straw Medication Administration: Crushed with puree Supervision: Staff to assist with self feeding Compensations: Minimize environmental distractions;Slow rate;Small sips/bites;Clear throat intermittently;Multiple dry swallows after each bite/sip Postural Changes and/or Swallow Maneuvers: Seated upright 90 degrees;Upright 30-60 min after meal                  Oral care BID   Frequent or constant Supervision/Assistance Dysphagia, pharyngoesophageal phase (R13.14);Cognitive communication deficit (Y78.295)     Goals updated     Amil Kale, M.A., CCC-SLP Speech Language Pathology, Acute Rehabilitation Services  Secure Chat preferred [phone]   02/23/2024, 10:18 AM

## 2024-02-23 NOTE — Progress Notes (Signed)
 Occupational Therapy Treatment Patient Details Name: [name] MRN: [id number] DOB: 17-May-1951 Today's Date: 02/23/2024   History of present illness 73 yo male presenting to ED 5/28 after being found seizing after falling down stairs. CTH bil SDH and SAH with trace IVH and non-depressed occipital skull fx. Intubated 5/28, self-extubated 5/30. Recently sustained bilateral rib fxs after a fall 5/17. Imaging also showed stable subacute fractures involving the right sacral ala, right superior and inferior rami, and left parasymphyseal region along with stable superior endplate compression deformities at T3, T4, and T5 since 01/21/2024. S/p EGD and PEG tube placement 6/18. PMH includes ETOH abuse s/p two liver transplants, 5/17 fall with R sacral ala superior and inferior rami fx, L superior endplate compression deformities at T3 T4 T5   OT comments  Pt making progress with functional goals. Pt sat EOB with Sup, donned socks with Sup and extra time to complete, sit - stand CGA to RW to walk to bathroom for toilet transfers CGA, clothing mgt and posterior hygiene min A, Pt stood at sink to wash and dry hands CGA, UB dressing to don clean gown. Pt required cues for safety throughout for correct hand placement with RW and during mobility to bathroom and back to bed (pt ditching RW before reaching bedside). OT will continue to follow acutely to maximize level of function and safety      If plan is discharge home, recommend the following:  A little help with walking and/or transfers;A little help with bathing/dressing/bathroom;Assistance with cooking/housework;Direct supervision/assist for medications management;Direct supervision/assist for financial management;Help with stairs or ramp for entrance;Assist for transportation   Equipment Recommendations  Other (comment) (defer)    Recommendations for Other Services      Precautions / Restrictions Precautions Precautions: Fall Recall of  Precautions/Restrictions: Impaired Precaution/Restrictions Comments: PEG tube Restrictions Weight Bearing Restrictions Per Provider Order: No       Mobility Bed Mobility Overal bed mobility: Needs Assistance Bed Mobility: Supine to Sit, Sit to Supine     Supine to sit: Supervision, HOB elevated, Used rails Sit to supine: Supervision, HOB elevated, Used rails   General bed mobility comments: Supervision and extra time to transition supine <> sit L EOB from elevated HOB    Transfers Overall transfer level: Needs assistance Equipment used: Rolling walker (2 wheels) Transfers: Sit to/from Stand Sit to Stand: Contact guard assist     Step pivot transfers: Contact guard assist           Balance Overall balance assessment: Needs assistance Sitting-balance support: No upper extremity supported, Feet supported Sitting balance-Leahy Scale: Good     Standing balance support: Bilateral upper extremity supported, During functional activity, Reliant on assistive device for balance Standing balance-Leahy Scale: Poor Standing balance comment: reliant on RW, stood 3 times from toilet for posterior hygiene completion assist from therapist                           ADL either performed or assessed with clinical judgement   ADL Overall ADL's : Needs assistance/impaired     Grooming: Wash/dry hands;Wash/dry face;Contact guard assist;Standing;Cueing for safety       Lower Body Bathing: Minimal assistance;Sitting/lateral leans;Sit to/from stand;Cueing for safety   Upper Body Dressing : Minimal assistance;Standing   Lower Body Dressing: Supervision/safety Lower Body Dressing Details (indicate cue type and reason): able to figure four BLEs for donning/management of socks Toilet Transfer: Contact guard assist;Ambulation;Rolling walker (2 wheels)   Toileting- Clothing  Manipulation and Hygiene: Minimal assistance;Sit to/from stand;Cueing for safety       Functional  mobility during ADLs: Contact guard assist;Rolling walker (2 wheels);Cueing for safety General ADL Comments: cues for hand placment    Extremity/Trunk Assessment Upper Extremity Assessment Upper Extremity Assessment: Generalized weakness   Lower Extremity Assessment Lower Extremity Assessment: Defer to PT evaluation   Cervical / Trunk Assessment Cervical / Trunk Assessment: Kyphotic    Vision Ability to See in Adequate Light: 0 Adequate Patient Visual Report: No change from baseline     Perception     Praxis     Communication Communication Communication: Impaired Factors Affecting Communication: Hearing impaired   Cognition Arousal: Alert Behavior During Therapy: Flat affect                                 Following commands: Impaired Following commands impaired: Only follows one step commands consistently, Follows one step commands with increased time      Cueing   Cueing Techniques: Verbal cues, Tactile cues, Visual cues  Exercises      Shoulder Instructions       General Comments      Pertinent Vitals/ Pain       Pain Assessment Pain Assessment: No/denies pain Pain Score: 0-No pain  Home Living                                          Prior Functioning/Environment              Frequency  Min 2X/week        Progress Toward Goals  OT Goals(current goals can now be found in the care plan section)  Progress towards OT goals: Progressing toward goals     Plan      Co-evaluation                 AM-PAC OT 6 Clicks Daily Activity     Outcome Measure   Help from another person eating meals?: A Little Help from another person taking care of personal grooming?: A Little Help from another person toileting, which includes using toliet, bedpan, or urinal?: A Little Help from another person bathing (including washing, rinsing, drying)?: A Lot Help from another person to put on and taking off regular upper  body clothing?: A Little Help from another person to put on and taking off regular lower body clothing?: A Lot 6 Click Score: 16    End of Session Equipment Utilized During Treatment: Rolling walker (2 wheels);Gait belt;Other (comment) (3 in 1 over toilet)  OT Visit Diagnosis: Unsteadiness on feet (R26.81);Other abnormalities of gait and mobility (R26.89);Muscle weakness (generalized) (M62.81)   Activity Tolerance Patient tolerated treatment well   Patient Left with call bell/phone within reach;in bed;with bed alarm set   Nurse Communication Mobility status        Time: 4098-1191 OT Time Calculation (min): 24 min  Charges: OT General Charges $OT Visit: 1 Visit OT Treatments $Self Care/Home Management : 8-22 mins $Therapeutic Activity: 8-22 mins    Alfred Ann 02/23/2024, 3:20 PM

## 2024-02-23 NOTE — TOC Progression Note (Signed)
 Transition of Care [facility]) - Progression Note    Patient Details  Name: [name] MRN: [id number] Date of Birth: [birth year]  [facility]) CM/SW Contact  Rubie Ficco E Boe Deans, LCSW Phone Number: 02/23/2024, 12:58 PM  Clinical Narrative:    Awaiting bed offers for STR.   Expected Discharge Plan: Skilled Nursing Facility Barriers to Discharge: Continued Medical Work up  Expected Discharge Plan and Services In-house Referral: Clinical Social Work     Living arrangements for the past 2 months: Single Family Home                                       Social Determinants of Health (SDOH) Interventions SDOH Screenings   Food Insecurity: No Food Insecurity (02/10/2024)  Housing: Low Risk  (02/10/2024)  Transportation Needs: No Transportation Needs (02/10/2024)  Utilities: Not At Risk (02/10/2024)  Social Connections: Moderately Integrated (02/10/2024)    Readmission Risk Interventions     No data to display

## 2024-02-23 NOTE — NC FL2 (Signed)
 [place]  MEDICAID FL2 LEVEL OF CARE FORM     IDENTIFICATION  Patient Name: [name] Birthdate: July 22, 1951 Sex: male Admission Date (Current Location): 02/01/2024  [facility] and [place] Number:  Producer, television/film/video and Address:  [facility], [address], [place], [place] [address]      Provider Number: [id number]  Attending Physician Name and Address:  Md, Trauma, MD  Relative Name and Phone Number:  Marily Shows (sister, [phone])    Current Level of Care: Hospital Recommended Level of Care: Skilled Nursing Facility Prior Approval Number:    Date Approved/Denied:   PASRR Number: [id number]  Discharge Plan: SNF    Current Diagnoses: Patient Active Problem List   Diagnosis Date Noted   Protein-calorie malnutrition, severe 02/08/2024   Paroxysmal atrial fibrillation with RVR (HCC) 02/07/2024   History of liver transplant (HCC) 02/07/2024   Fall 02/07/2024   Hypotension 02/07/2024   Sinus tachycardia 02/07/2024   Subdural hematoma (HCC) 02/01/2024    Orientation RESPIRATION BLADDER Height & Weight     Self, Time, Situation, Place  Normal External catheter Weight: 125 lb 10.6 oz (57 kg) Height:  5' 8 (172.7 cm)  BEHAVIORAL SYMPTOMS/MOOD NEUROLOGICAL BOWEL NUTRITION STATUS      Continent Feeding tube  AMBULATORY STATUS COMMUNICATION OF NEEDS Skin   Limited Assist Verbally Skin abrasions                       Personal Care Assistance Level of Assistance  Bathing, Feeding, Dressing Bathing Assistance: Limited assistance Feeding assistance: Limited assistance Dressing Assistance: Limited assistance     Functional Limitations Info  Hearing, Sight, Speech Sight Info: Adequate Hearing Info: Impaired Speech Info: Adequate    SPECIAL CARE FACTORS FREQUENCY  PT (By licensed PT), OT (By licensed OT)     PT Frequency: 5 times per week OT Frequency: 5 times per week            Contractures Contractures Info: Not present     Additional Factors Info  Code Status, Allergies Code Status Info: full Allergies Info: Tape, Zolpidem           Current Medications (02/23/2024):  This is the current hospital active medication list Current Facility-Administered Medications  Medication Dose Route Frequency Provider Last Rate Last Admin   (feeding supplement) PROSource Plus liquid 30 mL  30 mL Oral BID BM Lovick, Ayesha N, MD   30 mL at 02/22/24 1318   0.9 %  sodium chloride  infusion (Manually program via Guardrails IV Fluids)   Intravenous Once Lovick, Ayesha N, MD       ascorbic acid  (VITAMIN C ) tablet 500 mg  500 mg Per Tube TID Anda Bamberg, MD   500 mg at 02/22/24 2108   docusate (COLACE) 50 MG/5ML liquid 100 mg  100 mg Per Tube BID Anda Bamberg, MD   100 mg at 02/22/24 2108   famotidine  (PEPCID ) tablet 10 mg  10 mg Per Tube Daily Anda Bamberg, MD   10 mg at 02/22/24 1317   feeding supplement (ENSURE PLUS HIGH PROTEIN) liquid 237 mL  237 mL Oral BID BM Dorena Gander, MD   237 mL at 02/22/24 1320   feeding supplement (NEPRO CARB STEADY) liquid 1,000 mL  1,000 mL Per Tube Q24H Anda Bamberg, MD   1,000 mL at 02/22/24 2114   feeding supplement (PROSource TF20) liquid 60 mL  60 mL Per Tube Daily Anda Bamberg, MD  60 mL at 02/22/24 1201   ferrous sulfate  300 (60 Fe) MG/5ML syrup 300 mg  300 mg Per Tube TID WC Lovick, Bard Boor, MD       folic acid  (FOLVITE ) tablet 1 mg  1 mg Per Tube Daily Anda Bamberg, MD   1 mg at 02/22/24 1317   heparin  injection 5,000 Units  5,000 Units Subcutaneous Q8H Maczis, Michael M, PA-C   5,000 Units at 02/23/24 7829   hydrALAZINE  (APRESOLINE ) injection 10 mg  10 mg Intravenous Q2H PRN Dorena Gander, MD       HYDROmorphone  (DILAUDID ) injection 0.5 mg  0.5 mg Intravenous Q4H PRN Charlott Converse, PA-C   0.5 mg at 02/21/24 0241   ipratropium-albuterol  (DUONEB) 0.5-2.5 (3) MG/3ML nebulizer solution 3 mL  3 mL Nebulization Q4H PRN Dorena Gander, MD        levothyroxine  (SYNTHROID ) tablet 25 mcg  25 mcg Per Tube Q0600 Anda Bamberg, MD   25 mcg at 02/23/24 5621   metoprolol  tartrate (LOPRESSOR ) 25 mg/10 mL oral suspension 12.5 mg  12.5 mg Per Tube BID Anda Bamberg, MD   12.5 mg at 02/22/24 2108   mirtazapine (REMERON SOL-TAB) disintegrating tablet 7.5 mg  7.5 mg Per Tube QHS Anda Bamberg, MD   7.5 mg at 02/22/24 2109   multivitamin with minerals tablet 1 tablet  1 tablet Per Tube Daily Anda Bamberg, MD   1 tablet at 02/22/24 1318   ondansetron  (ZOFRAN -ODT) disintegrating tablet 4 mg  4 mg Oral Q6H PRN Dorena Gander, MD       Or   ondansetron  (ZOFRAN ) injection 4 mg  4 mg Intravenous Q6H PRN Dorena Gander, MD   4 mg at 02/03/24 1223   Oral care mouth rinse  15 mL Mouth Rinse 4 times per day Anda Bamberg, MD   15 mL at 02/23/24 0817   Oral care mouth rinse  15 mL Mouth Rinse PRN Anda Bamberg, MD       oxyCODONE  (Oxy IR/ROXICODONE ) immediate release tablet 5-10 mg  5-10 mg Per Tube Q4H PRN Anda Bamberg, MD   5 mg at 02/22/24 1319   polyethylene glycol (MIRALAX  / GLYCOLAX ) packet 17 g  17 g Per Tube Daily Anda Bamberg, MD   17 g at 02/22/24 1320   sodium chloride  flush (NS) 0.9 % injection 10-40 mL  10-40 mL Intracatheter PRN Anda Bamberg, MD       sodium chloride  flush (NS) 0.9 % injection 10-40 mL  10-40 mL Intracatheter PRN Nan Aver, MD       tacrolimus  (PROGRAF ) capsule 0.5 mg  0.5 mg Sublingual BID Millen, Jessica B, RPH   0.5 mg at 02/22/24 2108   tamsulosin  (FLOMAX ) capsule 0.4 mg  0.4 mg Oral QPC supper Nan Aver, MD   0.4 mg at 02/22/24 1747   thiamine  (VITAMIN B1) tablet 100 mg  100 mg Per Tube Daily Anda Bamberg, MD   100 mg at 02/22/24 1317     Discharge Medications: Please see discharge summary for a list of discharge medications.  Relevant Imaging Results:  Relevant Lab Results:   Additional Information SS #: [id number]  Latyra Jaye E Shephanie Romas, LCSW

## 2024-02-23 NOTE — TOC Progression Note (Signed)
 Transition of Care [facility]) - Progression Note    Patient Details  Name: [name] MRN: [id number] Date of Birth: 18-Dec-1950  Transition of [facility]) CM/SW Contact  Cristen Bredeson E Carthel Castille, LCSW Phone Number: 02/23/2024, 9:13 AM  Clinical Narrative:    Per chart review - patient with PEG tube. Children spoke with MD and elected for patient's sister [name] to make decisions since patient has been determined by psych to not have capacity.  CSW started SNF workup for STR.  Expected Discharge Plan: Skilled Nursing Facility Barriers to Discharge: Continued Medical Work up  Expected Discharge Plan and Services In-house Referral: Clinical Social Work     Living arrangements for the past 2 months: Single Family Home                                       Social Determinants of Health (SDOH) Interventions SDOH Screenings   Food Insecurity: No Food Insecurity (02/10/2024)  Housing: Low Risk  (02/10/2024)  Transportation Needs: No Transportation Needs (02/10/2024)  Utilities: Not At Risk (02/10/2024)  Social Connections: Moderately Integrated (02/10/2024)    Readmission Risk Interventions     No data to display

## 2024-02-23 NOTE — Progress Notes (Addendum)
 1 Day Post-Op  Subjective: CC: No complaints. No cp or sob. Diet re-ordered by SLP this am. No n/v. Tolerating tf's overnight. Voiding. Last BM 6/16.  Afebrile. HR 96 on tele currently, sinus tach. No hypotension. On RA. Cr stable at 2.87. K 5.7.  Objective: Vital signs in last 24 hours: Temp:  [97.5 F (36.4 C)-98.1 F (36.7 C)] 98.1 F (36.7 C) (06/19 0612) Pulse Rate:  [92-108] 106 (06/19 0612) Resp:  [13-20] 20 (06/19 0612) BP: (105-136)/(63-90) 136/90 (06/19 0612) SpO2:  [93 %-100 %] 96 % (06/19 0612) Last BM Date : 02/20/24  Intake/Output from previous day: 06/18 0701 - 06/19 0700 In: 350 [I.V.:100; IV Piggyback:250] Out: 550 [Urine:550] Intake/Output this shift: No intake/output data recorded.  PE: Gen:  Alert, NAD, pleasant Card:  Tachycardic  Pulm: CTA b/l. Rate and effort normal Abd: Soft, ND, NT. PEG tube in place, currently clamped, site cdi Ext:  No LE edema Neuro: CN 3-12 grossly intact, MAE's, f/c, non-focal  Lab Results:  No results for input(s): WBC, HGB, HCT, PLT in the last 72 hours. BMET Recent Labs    02/22/24 0502 02/23/24 0449  NA 132* 135  K 5.4* 5.7*  CL 106 105  CO2 18* 20*  GLUCOSE 99 108*  BUN 116* 133*  CREATININE 2.84* 2.87*  CALCIUM 9.4 9.8   PT/INR No results for input(s): LABPROT, INR in the last 72 hours. CMP     Component Value Date/Time   NA 135 02/23/2024 0449   K 5.7 (H) 02/23/2024 0449   CL 105 02/23/2024 0449   CO2 20 (L) 02/23/2024 0449   GLUCOSE 108 (H) 02/23/2024 0449   BUN 133 (H) 02/23/2024 0449   CREATININE 2.87 (H) 02/23/2024 0449   CALCIUM 9.8 02/23/2024 0449   PROT 5.8 (L) 02/13/2024 0531   ALBUMIN 2.8 (L) 02/23/2024 0449   AST 24 02/13/2024 0531   ALT 24 02/13/2024 0531   ALKPHOS 133 (H) 02/13/2024 0531   BILITOT 0.3 02/13/2024 0531   GFRNONAA 23 (L) 02/23/2024 0449   Lipase  No results found for: LIPASE  Studies/Results: No results  found.  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 Rochelle Chu  Loaded with Keppra  in ED, completed 1 week BID keppra , F/U CT head slightly increased SDH but exam stable. TBI therapies.  Bilateral rib fractures that were sustained in a fall on 01/21/2024, similar on follow-up CT scan - multimodal pain control, pulm toilet,  Acute hypoxic respiratory failure - self-extubated, weaned back to room air, continue flutter, chest PT, duonebs EtoH abuse and withdrawal - CIWA. MVI, thiamine , folic acid . No longer requiring dex or ativan . Tachycardia - Cards has seen and signed off. Cont metoprolol .  FEN: S/p PEG 6/18. Nocturnal TF's. Passed for Reg diet with thin liquids with SLP. Renal diet. Ecouraged PO CKD/AKI - appreciate Nephrology F/U. Hyperkalemia noted at 5.7 today. Cont tele. Give Lokelma . Switch to low potassium diet and nepro for TF's. Trend K.  VTE: PAS, SQH Endo: low dose LT4 ID: None Foley: none Dispo: 4NP, therapies. Eval by psych yesterday and deemed not to have capacity for decision-making regarding PEG placement and SNF placement. He has limited recollection of the extensive conversation held yesterday regarding these two topics. Palliative also engaged. No HCPOA, legal spouse identified. Per palliative note, Patients oldest son, Caitlin and  sister, Marily Shows have been appointed by family to make decisions. Plan for SNF. Medically stable for d/c to SNF.    I reviewed nursing notes, last 24 h vitals and pain scores, last 48 h intake and output, last 24 h labs and trends, and last 24 h imaging results   LOS: 22 days    Delton Filbert, [facility] Surgery 02/23/2024, 10:22 AM Please see Amion for pager number during day hours 7:00am-4:30pm

## 2024-02-24 ENCOUNTER — Encounter (HOSPITAL_COMMUNITY): Payer: Self-pay | Admitting: Surgery

## 2024-02-24 LAB — GLUCOSE, CAPILLARY
Glucose-Capillary: 92 mg/dL (ref 70–99)
Glucose-Capillary: 96 mg/dL (ref 70–99)
Glucose-Capillary: 136 mg/dL — ABNORMAL HIGH (ref 70–99)
Glucose-Capillary: 115 mg/dL — ABNORMAL HIGH (ref 70–99)
Glucose-Capillary: 116 mg/dL — ABNORMAL HIGH (ref 70–99)
Glucose-Capillary: 114 mg/dL — ABNORMAL HIGH (ref 70–99)
Glucose-Capillary: 133 mg/dL — ABNORMAL HIGH (ref 70–99)

## 2024-02-24 LAB — RENAL FUNCTION PANEL
Albumin: 2.7 g/dL — ABNORMAL LOW (ref 3.5–5.0)
Anion gap: 11 (ref 5–15)
BUN: 131 mg/dL — ABNORMAL HIGH (ref 8–23)
CO2: 23 mmol/L (ref 22–32)
Calcium: 10.5 mg/dL — ABNORMAL HIGH (ref 8.9–10.3)
Chloride: 102 mmol/L (ref 98–111)
Creatinine, Ser: 2.53 mg/dL — ABNORMAL HIGH (ref 0.61–1.24)
GFR, Estimated: 27 mL/min — ABNORMAL LOW (ref >60)
Glucose, Bld: 97 mg/dL (ref 70–99)
Phosphorus: 3.4 mg/dL (ref 2.5–4.6)
Potassium: 4.9 mmol/L (ref 3.5–5.1)
Sodium: 136 mmol/L (ref 135–145)

## 2024-02-24 MED ORDER — METOPROLOL TARTRATE 5 MG/5ML IV SOLN
5.0000 mg | Freq: Once | INTRAVENOUS | Status: DC
  Filled 2024-02-24: qty 5

## 2024-02-24 NOTE — Plan of Care (Signed)

## 2024-02-24 NOTE — Progress Notes (Signed)
 2 Days Post-Op  Subjective: CC: Tachy overnight. Improved without intervention to low 100's. EKG ordered. Tele reviewed. RN to give scheduled po metoprolol .   Seen with RN. No complaints. No cp or sob. Tolerating po and tf's without n/v. BM yuesterday.  Voiding with good uop on I/O.   Afebrile. No hypotension. On RA. Cr improved at 2.53. Hyperkalemia resolved at 4.9 today.   Objective: Vital signs in last 24 hours: Temp:  [97.7 F (36.5 C)-98.5 F (36.9 C)] 98.5 F (36.9 C) (06/20 0829) Pulse Rate:  [97-108] 108 (06/20 0829) Resp:  [13-18] 13 (06/20 0829) BP: (101-117)/(64-75) 117/72 (06/20 0829) SpO2:  [92 %-98 %] (P) 92 % (06/20 0829) Weight:  [61.6 kg] 61.6 kg (06/20 0313) Last BM Date : 02/23/24  Intake/Output from previous day: 06/19 0701 - 06/20 0700 In: -  Out: 1300 [Urine:1300] Intake/Output this shift: No intake/output data recorded.  PE: Gen:  Alert, NAD, pleasant Card:  Tachycardic. Appears sinus tach on monitor.  Pulm: CTA b/l. Rate and effort normal Abd: Soft, ND, NT. PEG tube in place, currently clamped, site cdi Ext:  No LE edema Neuro: CN 3-12 grossly intact, MAE's, f/c, non-focal  Lab Results:  No results for input(s): WBC, HGB, HCT, PLT in the last 72 hours. BMET Recent Labs    02/23/24 0449 02/24/24 0511  NA 135 136  K 5.7* 4.9  CL 105 102  CO2 20* 23  GLUCOSE 108* 97  BUN 133* 131*  CREATININE 2.87* 2.53*  CALCIUM 9.8 10.5*   PT/INR No results for input(s): LABPROT, INR in the last 72 hours. CMP     Component Value Date/Time   NA 136 02/24/2024 0511   K 4.9 02/24/2024 0511   CL 102 02/24/2024 0511   CO2 23 02/24/2024 0511   GLUCOSE 97 02/24/2024 0511   BUN 131 (H) 02/24/2024 0511   CREATININE 2.53 (H) 02/24/2024 0511   CALCIUM 10.5 (H) 02/24/2024 0511   PROT 5.8 (L) 02/13/2024 0531   ALBUMIN 2.7 (L) 02/24/2024 0511   AST 24 02/13/2024 0531   ALT 24 02/13/2024 0531   ALKPHOS 133 (H) 02/13/2024 0531   BILITOT  0.3 02/13/2024 0531   GFRNONAA 27 (L) 02/24/2024 0511   Lipase  No results found for: LIPASE  Studies/Results: No results found.  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 Rochelle Chu  Loaded with Keppra  in ED, completed 1 week BID keppra , F/U CT head slightly increased SDH but exam stable. TBI therapies.  Bilateral rib fractures that were sustained in a fall on 01/21/2024, similar on follow-up CT scan - multimodal pain control, pulm toilet,  Acute hypoxic respiratory failure - self-extubated, weaned back to room air, continue flutter, chest PT, duonebs EtoH abuse and withdrawal - CIWA. MVI, thiamine , folic acid . No longer requiring dex or ativan . Tachycardia - Cards has seen and signed off. EKG. Cont metoprolol .  FEN: S/p PEG 6/18. Nocturnal TF's. Passed for Reg diet with thin liquids with SLP. Renal diet. Ecouraged PO CKD/AKI - appreciate Nephrology F/U. S/p Lokelma  6/19 for hyperkalemia. Cont low potassium diet and nepro for TF's. Hyperkalemia resolved at 4.9 on last check.   VTE: PAS, SQH Endo: low dose LT4 ID: None Foley: none Dispo: 4NP, therapies. Eval by psych yesterday and deemed not to have capacity for decision-making regarding PEG placement  and SNF placement. He has limited recollection of the extensive conversation held yesterday regarding these two topics. Palliative also engaged. No HCPOA, legal spouse identified. Per palliative note, Patients oldest son, Maxwel and sister, Marily Shows have been appointed by family to make decisions. Plan for SNF. Medically stable for d/c to SNF.    I reviewed nursing notes, last 24 h vitals and pain scores, last 48 h intake and output, last 24 h labs and trends, and last 24 h imaging results   LOS: 23 days    Delton Filbert, [facility] Surgery 02/24/2024, 8:39 AM Please see Amion for pager number during day hours 7:00am-4:30pm

## 2024-02-24 NOTE — Progress Notes (Signed)
 Physical Therapy Treatment Patient Details Name: [name] MRN: [id number] DOB: 08/06/1951 Today's Date: 02/24/2024   History of Present Illness 73 yo male presenting to ED 5/28 after being found seizing after falling down stairs. CTH bil SDH and SAH with trace IVH and non-depressed occipital skull fx. Intubated 5/28, self-extubated 5/30. Recently sustained bilateral rib fxs after a fall 5/17. Imaging also showed stable subacute fractures involving the right sacral ala, right superior and inferior rami, and left parasymphyseal region along with stable superior endplate compression deformities at T3, T4, and T5 since 01/21/2024. S/p EGD and PEG tube placement 6/18. PMH includes ETOH abuse s/p two liver transplants, 5/17 fall with R sacral ala superior and inferior rami fx, L superior endplate compression deformities at T3 T4 T5    PT Comments  The pt needed encouragement to participate. He continues to report frustration in that he believes he was supposed to d/c home yesterday and was told he could also go home today, yet the plan continues to appear to be to go to a SNF for rehab per chart. He was provided a target goal to walk towards, which improved his participation. He needs continued cuing to stand upright, look forward and not inferiorly, and widen his stance. He continues to display deficits in strength, balance, endurance, and cognition that place him at risk for falls and injury. He is requiring up to minA for transfers and gait bouts with a RW. Will continue to follow acutely.   If plan is discharge home, recommend the following: Assistance with cooking/housework;Assist for transportation;Help with stairs or ramp for entrance;Supervision due to cognitive status;A little help with walking and/or transfers;A little help with bathing/dressing/bathroom;Direct supervision/assist for medications management;Direct supervision/assist for financial management   Can travel by private vehicle      Yes  Equipment Recommendations  Rolling walker (2 wheels);BSC/3in1;Wheelchair (measurements PT)    Recommendations for Other Services       Precautions / Restrictions Precautions Precautions: Fall Recall of Precautions/Restrictions: Impaired Precaution/Restrictions Comments: PEG tube, abdominal binder Restrictions Weight Bearing Restrictions Per Provider Order: No Other Position/Activity Restrictions: Dr. Aniceto Barley confirmed no spinal precautions or weight bearing restrictions 5/31     Mobility  Bed Mobility Overal bed mobility: Needs Assistance Bed Mobility: Supine to Sit, Sit to Supine     Supine to sit: Supervision, HOB elevated, Used rails Sit to supine: Supervision, HOB elevated, Used rails   General bed mobility comments: Supervision and extra time to transition supine <> sit R EOB from elevated HOB.    Transfers Overall transfer level: Needs assistance Equipment used: Rolling walker (2 wheels) Transfers: Sit to/from Stand Sit to Stand: Min assist           General transfer comment: MinA for balance when transferring to stand from EOB, x4 reps    Ambulation/Gait Ambulation/Gait assistance: Contact guard assist, Min assist Gait Distance (Feet): 110 Feet Assistive device: Rolling walker (2 wheels) Gait Pattern/deviations: Decreased stride length, Step-through pattern, Decreased step length - right, Decreased step length - left, Trunk flexed, Narrow base of support Gait velocity: reduced Gait velocity interpretation: <1.31 ft/sec, indicative of household ambulator   General Gait Details: Pt takes slow, small, narrow steps while maintaining a kyphotic posture and inferior gaze. Verbal and tactile cues provided to look superiorly and improve upright posture. VCs provided to widen stance. Momentary success noted with cuing. CGA-minA for balance and safety throughout.   Stairs             Psychologist, prison and probation services  Tilt Bed    Modified Rankin (Stroke  Patients Only)       Balance Overall balance assessment: Needs assistance Sitting-balance support: No upper extremity supported, Feet supported Sitting balance-Leahy Scale: Good     Standing balance support: Bilateral upper extremity supported, During functional activity, Reliant on assistive device for balance Standing balance-Leahy Scale: Poor Standing balance comment: reliant on RW                            Communication Communication Communication: Impaired Factors Affecting Communication: Hearing impaired  Cognition Arousal: Alert Behavior During Therapy: Flat affect   PT - Cognitive impairments: Awareness, Memory, Attention, Initiation, Safety/Judgement, Problem solving                   Rancho Levels of Cognitive Functioning Rancho Los Amigos Scales of Cognitive Functioning: Automatic, Appropriate: Minimal Assistance for Daily Living Skills Rancho Los Amigos Scales of Cognitive Functioning: Automatic, Appropriate: Minimal Assistance for Daily Living Skills [VII] PT - Cognition Comments: Needed encouragement to participate. Pt perseverating on being told he could d/c home.  Poor awareness of his deficits that impact his safety. Pt seems to have poor memory as he reports he went downstairs to see a doctor this morning and that they tore up his gown. Did note cut up shirt and jacket in his bed though. Following commands: Impaired Following commands impaired: Only follows one step commands consistently, Follows one step commands with increased time    Cueing Cueing Techniques: Verbal cues, Tactile cues, Visual cues  Exercises Other Exercises Other Exercises: x3 serial sit <> stand with minA    General Comments        Pertinent Vitals/Pain Pain Assessment Pain Assessment: Faces Faces Pain Scale: Hurts a little bit Pain Location: generalized Pain Descriptors / Indicators: Discomfort, Grimacing Pain Intervention(s): Monitored during session, Limited  activity within patient's tolerance, Repositioned    Home Living                          Prior Function            PT Goals (current goals can now be found in the care plan section) Acute Rehab PT Goals Patient Stated Goal: to go home PT Goal Formulation: With patient Time For Goal Achievement: 03/01/24 Potential to Achieve Goals: Good Progress towards PT goals: Progressing toward goals    Frequency    Min 3X/week      PT Plan      Co-evaluation              AM-PAC PT 6 Clicks Mobility   Outcome Measure  Help needed turning from your back to your side while in a flat bed without using bedrails?: A Little Help needed moving from lying on your back to sitting on the side of a flat bed without using bedrails?: A Little Help needed moving to and from a bed to a chair (including a wheelchair)?: A Little Help needed standing up from a chair using your arms (e.g., wheelchair or bedside chair)?: A Little Help needed to walk in hospital room?: A Little Help needed climbing 3-5 steps with a railing? : A Lot 6 Click Score: 17    End of Session Equipment Utilized During Treatment: Gait belt Activity Tolerance: Patient tolerated treatment well Patient left: in bed;with call bell/phone within reach;with bed alarm set   PT Visit Diagnosis: Unsteadiness on feet (R26.81);Muscle  weakness (generalized) (M62.81);Other symptoms and signs involving the nervous system (R29.898);Other abnormalities of gait and mobility (R26.89);Difficulty in walking, not elsewhere classified (R26.2)     Time: 4098-1191 PT Time Calculation (min) (ACUTE ONLY): 16 min  Charges:    $Gait Training: 8-22 mins PT General Charges $$ ACUTE PT VISIT: 1 Visit                     Vernida Goodie, PT, DPT Acute Rehabilitation Services  Office: [phone]    Ellyn Hack 02/24/2024, 4:48 PM

## 2024-02-24 NOTE — TOC Progression Note (Addendum)
 Transition of Care [facility]) - Progression Note    Patient Details  Name: [name] MRN: [id number] Date of Birth: [birth year]  Transition of Care [facility]) CM/SW Contact  Randell Bussing, LCSW Phone Number: 02/24/2024, 9:03 AM  Clinical Narrative:    Zettie Hillock B. With [facility] SNFs - she states they are considering patient, will need to confirm whether patient's insurance plan will cover out of state - she states they will call to see.    3:30- [place] with [facility] states they are in network with patient's insurance. Patient has a $10 per day copay for days 1-20 that would need to be paid upfront. [place] states they can offer at [facility], [facility], [facility], and [facility] of [address]. [place] confirms they are aware of alcohol  use history.  Called patient's sister Marily Shows, reviewed bed offers and provided Medicare.gov ratings. She chose [facility] as their first choice.Marily Shows states they can pay the up front copay ($200).  [place] with [facility] to start insurance auth. Per [place], the earliest they can take patient is Monday if Siegfried Dress is approved. CSW will follow up on Monday.   3:43- Confirmed current tube feed regimen with RD and then updated [place] with [facility].  Expected Discharge Plan: Skilled Nursing Facility Barriers to Discharge: Continued Medical Work up  Expected Discharge Plan and Services In-house Referral: Clinical Social Work     Living arrangements for the past 2 months: Single Family Home                                       Social Determinants of Health (SDOH) Interventions SDOH Screenings   Food Insecurity: No Food Insecurity (02/10/2024)  Housing: Low Risk  (02/10/2024)  Transportation Needs: No Transportation Needs (02/10/2024)  Utilities: Not At Risk (02/10/2024)  Social Connections: Moderately Integrated (02/10/2024)    Readmission Risk Interventions     No data to display

## 2024-02-24 NOTE — Progress Notes (Addendum)
 Nutrition Follow-up  DOCUMENTATION CODES:   Severe malnutrition in context of chronic illness  INTERVENTION:  Continue current diet as ordered per SLP (regular diet, thin liquids) Encourage PO intake Room service with assist  Continue nocturnal feedings via PEG tube: (1800-0600) Nepro Carb Steady @ 83 ml/hr x 12 hours (996 ml per day) ProSource TF20 1x/d 150 ml FWF Q6H - FWF ok per Nephrology  Provides 1,843 kcal (100% calorie needs), 100 gm protein (100% protein needs), 698 ml water  (1,298 ml water  daily TF + FWF)  Continue: 100 mg thiamine  and 1 mg folic acid  daily Daily weights Continue Mighty Shake TID with meals, each supplement provides 330 kcals and 9 grams of protein Discontinue Ensure Plus High Protein, pt does not like  NUTRITION DIAGNOSIS:  Severe Malnutrition related to chronic illness as evidenced by severe fat depletion, severe muscle depletion. - Still applicable   GOAL:  Patient will meet greater than or equal to 90% of their needs - Met via TF  MONITOR:  TF tolerance  REASON FOR ASSESSMENT:  Consult Enteral/tube feeding initiation and management (nocturnal TF)  ASSESSMENT:  Pt with PMH of ETOH abuse s/p 2 liver transplants, CKD 3b, admitted after fall, found at the bottom of the stairs, admitted with TBI/bilateral SDH, anterior SAH, skull fx, and bilateral rib fxs (from 5/17 fall).  5/30 - cortrak placed (gastric), s/p MBSS- dysphagia 2 diet with nectar thick liquids 5/31 - s/p BSE- dysphagia 2 diet with nectar thick liquids, transitioned to nocturnal feeds 6/04 - transitioned back to continuous feeds as pt with very poor PO intake 6/07 - Transitioned to Nocturnal tube feeds  6/08 - Patient pulled out cortrak  6/07 - Nocturnal tube feeds started to meet 80% of needs 6/09 - Cortrak replaced 6/10 - Nocturnal tube feeds increased to meet 100% of needs due to poor PO intake 6/18 - s/p EGD and PEG placement    Discussed with RN, pt still not eating much.  Does not like Ensure, decline any that are offered; RD to discontinue order. Denies any nausea or vomiting. Recommend continuing with nocturnal tube feeds to meet 100% of estimated needs due to ongoing poor PO intake.  Ongoing discharge planning.   Average Meal Intake 6/2: 25% x 1 meal 6/6: 0% x 2 meal 6/9: 15% x 3 meals 6/12: 0% x 1 meal  Admit weight: 62 kg Current weight: 61.6 kg (6/20)  Nutrition Related Medications: Vitamin C , Colace, Pepcid , Ferrous Sulfate , Folic Acid , Remeron Sol-tab, Miralax , Thiamine   Labs: reviewed  CBG: 96-112 mg/dL x 24 hrs   Diet Order:   Diet Order             Diet renal with fluid restriction Fluid restriction: 1200 mL Fluid; Room service appropriate? Yes; Fluid consistency: Thin  Diet effective now                  EDUCATION NEEDS: Not appropriate for education at this time  Skin:  Skin Assessment: Skin Integrity Issues: Skin Integrity Issues:: Other (Comment) Other: head laceration, skin tear to lt lower arm  Last BM:  6/19  Height:  Ht Readings from Last 1 Encounters:  02/22/24 5' 8 (1.727 m)   Weight:  Wt Readings from Last 1 Encounters:  02/24/24 61.6 kg   Ideal Body Weight:  70 kg  BMI:  Body mass index is 20.65 kg/m.  Estimated Nutritional Needs:  Kcal:  1800-2000 Protein:  95-115 grams Fluid:  > 1.8 L/day   Doneta Furbish  RD, LDN Clinical Dietitian

## 2024-02-24 NOTE — Plan of Care (Signed)

## 2024-02-25 LAB — GLUCOSE, CAPILLARY
Glucose-Capillary: 117 mg/dL — ABNORMAL HIGH (ref 70–99)
Glucose-Capillary: 119 mg/dL — ABNORMAL HIGH (ref 70–99)
Glucose-Capillary: 121 mg/dL — ABNORMAL HIGH (ref 70–99)
Glucose-Capillary: 133 mg/dL — ABNORMAL HIGH (ref 70–99)
Glucose-Capillary: 101 mg/dL — ABNORMAL HIGH (ref 70–99)
Glucose-Capillary: 121 mg/dL — ABNORMAL HIGH (ref 70–99)
Glucose-Capillary: 137 mg/dL — ABNORMAL HIGH (ref 70–99)

## 2024-02-25 LAB — RENAL FUNCTION PANEL
Albumin: 2.6 g/dL — ABNORMAL LOW (ref 3.5–5.0)
Anion gap: 12 (ref 5–15)
BUN: 129 mg/dL — ABNORMAL HIGH (ref 8–23)
CO2: 21 mmol/L — ABNORMAL LOW (ref 22–32)
Calcium: 10.6 mg/dL — ABNORMAL HIGH (ref 8.9–10.3)
Chloride: 103 mmol/L (ref 98–111)
Creatinine, Ser: 2.72 mg/dL — ABNORMAL HIGH (ref 0.61–1.24)
GFR, Estimated: 24 mL/min — ABNORMAL LOW (ref >60)
Glucose, Bld: 112 mg/dL — ABNORMAL HIGH (ref 70–99)
Phosphorus: 3.4 mg/dL (ref 2.5–4.6)
Potassium: 5.1 mmol/L (ref 3.5–5.1)
Sodium: 136 mmol/L (ref 135–145)

## 2024-02-25 LAB — AMMONIA: Ammonia: 13 umol/L (ref 9–35)

## 2024-02-25 NOTE — Progress Notes (Signed)
 Nurse secretary walkied bed alarm in room 5 by the time staff arrived he was found on floor on hid bottom. bed alarm was on no injuries noted.MD on call for trauma paged to notify [facility]

## 2024-02-25 NOTE — Progress Notes (Signed)
 3 Days Post-Op  Subjective: CC: Mild Tachy overnight. Improved without intervention to low 100's. . No complaints. No cp or sob. Tolerating po and tf's without n/v. BM yuesterday.  Voiding with good uop on I/O.   Afebrile. No hypotension. On RA.   Objective: Vital signs in last 24 hours: Temp:  [97.7 F (36.5 C)-98.4 F (36.9 C)] 98.4 F (36.9 C) (06/21 0745) Pulse Rate:  [103-110] 110 (06/21 0811) Resp:  [16-18] 18 (06/21 0745) BP: (87-112)/(58-73) 105/72 (06/21 0811) SpO2:  [92 %-96 %] 95 % (06/21 0745) Weight:  [55.5 kg] 55.5 kg (06/21 0500) Last BM Date : 02/23/24  Intake/Output from previous day: 06/20 0701 - 06/21 0700 In: -  Out: 500 [Urine:500] Intake/Output this shift: No intake/output data recorded.  PE: Gen:  Alert, NAD, pleasant Card:  mild Tachycardic. Appears sinus tach on monitor.  Pulm: CTA b/l. Rate and effort normal Abd: Soft, ND, NT. PEG tube in place, currently clamped, site cdi Ext:  No LE edema Neuro: CN 3-12 grossly intact, MAE's, f/c, non-focal  Lab Results:  No results for input(s): WBC, HGB, HCT, PLT in the last 72 hours. BMET Recent Labs    02/23/24 0449 02/24/24 0511  NA 135 136  K 5.7* 4.9  CL 105 102  CO2 20* 23  GLUCOSE 108* 97  BUN 133* 131*  CREATININE 2.87* 2.53*  CALCIUM 9.8 10.5*   PT/INR No results for input(s): LABPROT, INR in the last 72 hours. CMP     Component Value Date/Time   NA 136 02/24/2024 0511   K 4.9 02/24/2024 0511   CL 102 02/24/2024 0511   CO2 23 02/24/2024 0511   GLUCOSE 97 02/24/2024 0511   BUN 131 (H) 02/24/2024 0511   CREATININE 2.53 (H) 02/24/2024 0511   CALCIUM 10.5 (H) 02/24/2024 0511   PROT 5.8 (L) 02/13/2024 0531   ALBUMIN 2.7 (L) 02/24/2024 0511   AST 24 02/13/2024 0531   ALT 24 02/13/2024 0531   ALKPHOS 133 (H) 02/13/2024 0531   BILITOT 0.3 02/13/2024 0531   GFRNONAA 27 (L) 02/24/2024 0511   Lipase  No results found for: LIPASE  Studies/Results: No results  found.  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 Joshua  Loaded with Keppra  in ED, completed 1 week BID keppra , F/U CT head slightly increased SDH but exam stable. TBI therapies.  Bilateral rib fractures that were sustained in a fall on 01/21/2024, similar on follow-up CT scan - multimodal pain control, pulm toilet,  Acute hypoxic respiratory failure - self-extubated, weaned back to room air, continue flutter, chest PT, duonebs EtoH abuse and withdrawal - CIWA. MVI, thiamine , folic acid . No longer requiring dex or ativan . Tachycardia - Cards has seen and signed off. EKG. Cont metoprolol .  FEN: S/p PEG 6/18. Nocturnal TF's. Passed for Reg diet with thin liquids with SLP. Renal diet. Ecouraged PO CKD/AKI - appreciate Nephrology F/U. S/p Lokelma  6/19 for hyperkalemia. Cont low potassium diet and nepro for TF's. Hyperkalemia resolved at 4.9 on last check.   VTE: PAS, SQH Endo: low dose LT4 ID: None Foley: none Dispo: 4NP, therapies. Eval by psych and deemed not to have capacity for decision-making regarding PEG placement and SNF placement. He has limited recollection of the extensive conversation held yesterday regarding these two topics. Palliative also engaged. No HCPOA, legal  spouse identified. Per palliative note, Patients oldest son, Daeshon and sister, [name] have been appointed by family to make decisions. Plan for SNF. Medically stable for d/c to SNF. Reportedly a bed avail on monday   I reviewed nursing notes, last 24 h vitals and pain scores, last 48 h intake and output, last 24 h labs and trends, and last 24 h imaging results   LOS: 24 days    Camellia Blush, MD [facility] Surgery 02/25/2024, 10:22 AM Please see Amion for pager number during day hours 7:00am-4:30pm

## 2024-02-25 NOTE — Plan of Care (Signed)
  Problem: Education: Goal: Knowledge of General Education information will improve Description: Including pain rating scale, medication(s)/side effects and non-pharmacologic comfort measures 02/25/2024 0343 by Marvis Kenneth SAILOR, RN Outcome: Progressing 02/25/2024 0056 by Marvis Kenneth SAILOR, RN Outcome: Progressing   Problem: Health Behavior/Discharge Planning: Goal: Ability to manage health-related needs will improve 02/25/2024 0343 by Marvis Kenneth SAILOR, RN Outcome: Progressing 02/25/2024 0056 by Marvis Kenneth SAILOR, RN Outcome: Progressing   Problem: Clinical Measurements: Goal: Ability to maintain clinical measurements within normal limits will improve 02/25/2024 0343 by Marvis Kenneth SAILOR, RN Outcome: Progressing 02/25/2024 0056 by Marvis Kenneth SAILOR, RN Outcome: Progressing Goal: Will remain free from infection 02/25/2024 0343 by Marvis Kenneth SAILOR, RN Outcome: Progressing 02/25/2024 0056 by Marvis Kenneth SAILOR, RN Outcome: Progressing Goal: Diagnostic test results will improve 02/25/2024 0343 by Marvis Kenneth SAILOR, RN Outcome: Progressing 02/25/2024 0056 by Marvis Kenneth SAILOR, RN Outcome: Progressing Goal: Respiratory complications will improve 02/25/2024 0343 by Marvis Kenneth SAILOR, RN Outcome: Progressing 02/25/2024 0056 by Marvis Kenneth SAILOR, RN Outcome: Progressing Goal: Cardiovascular complication will be avoided 02/25/2024 0343 by Marvis Kenneth SAILOR, RN Outcome: Progressing 02/25/2024 0056 by Marvis Kenneth SAILOR, RN Outcome: Progressing   Problem: Activity: Goal: Risk for activity intolerance will decrease 02/25/2024 0343 by Marvis Kenneth SAILOR, RN Outcome: Progressing 02/25/2024 0056 by Marvis Kenneth SAILOR, RN Outcome: Progressing   Problem: Nutrition: Goal: Adequate nutrition will be maintained 02/25/2024 0343 by Marvis Kenneth SAILOR, RN Outcome: Progressing 02/25/2024 0056 by Marvis Kenneth SAILOR, RN Outcome: Progressing   Problem: Coping: Goal: Level of anxiety will  decrease 02/25/2024 0343 by Marvis Kenneth SAILOR, RN Outcome: Progressing 02/25/2024 0056 by Marvis Kenneth SAILOR, RN Outcome: Progressing   Problem: Elimination: Goal: Will not experience complications related to bowel motility 02/25/2024 0343 by Marvis Kenneth SAILOR, RN Outcome: Progressing 02/25/2024 0056 by Marvis Kenneth SAILOR, RN Outcome: Progressing Goal: Will not experience complications related to urinary retention 02/25/2024 0343 by Marvis Kenneth SAILOR, RN Outcome: Progressing 02/25/2024 0056 by Marvis Kenneth SAILOR, RN Outcome: Progressing   Problem: Pain Managment: Goal: General experience of comfort will improve and/or be controlled 02/25/2024 0343 by Marvis Kenneth SAILOR, RN Outcome: Progressing 02/25/2024 0056 by Marvis Kenneth SAILOR, RN Outcome: Progressing   Problem: Safety: Goal: Ability to remain free from injury will improve 02/25/2024 0343 by Marvis Kenneth SAILOR, RN Outcome: Progressing 02/25/2024 0056 by Marvis Kenneth SAILOR, RN Outcome: Progressing   Problem: Skin Integrity: Goal: Risk for impaired skin integrity will decrease 02/25/2024 0343 by Marvis Kenneth SAILOR, RN Outcome: Progressing 02/25/2024 0056 by Marvis Kenneth SAILOR, RN Outcome: Progressing

## 2024-02-26 LAB — RENAL FUNCTION PANEL
Albumin: 2.4 g/dL — ABNORMAL LOW (ref 3.5–5.0)
Anion gap: 12 (ref 5–15)
BUN: 141 mg/dL — ABNORMAL HIGH (ref 8–23)
CO2: 21 mmol/L — ABNORMAL LOW (ref 22–32)
Calcium: 10.8 mg/dL — ABNORMAL HIGH (ref 8.9–10.3)
Chloride: 102 mmol/L (ref 98–111)
Creatinine, Ser: 2.79 mg/dL — ABNORMAL HIGH (ref 0.61–1.24)
GFR, Estimated: 24 mL/min — ABNORMAL LOW (ref >60)
Glucose, Bld: 134 mg/dL — ABNORMAL HIGH (ref 70–99)
Phosphorus: 3.6 mg/dL (ref 2.5–4.6)
Potassium: 4.9 mmol/L (ref 3.5–5.1)
Sodium: 135 mmol/L (ref 135–145)

## 2024-02-26 LAB — GLUCOSE, CAPILLARY
Glucose-Capillary: 130 mg/dL — ABNORMAL HIGH (ref 70–99)
Glucose-Capillary: 123 mg/dL — ABNORMAL HIGH (ref 70–99)
Glucose-Capillary: 104 mg/dL — ABNORMAL HIGH (ref 70–99)
Glucose-Capillary: 143 mg/dL — ABNORMAL HIGH (ref 70–99)
Glucose-Capillary: 122 mg/dL — ABNORMAL HIGH (ref 70–99)
Glucose-Capillary: 119 mg/dL — ABNORMAL HIGH (ref 70–99)

## 2024-02-26 NOTE — Plan of Care (Signed)
  Problem: Education: Goal: Knowledge of General Education information will improve Description: Including pain rating scale, medication(s)/side effects and non-pharmacologic comfort measures 02/26/2024 0615 by Marvis Kenneth SAILOR, RN Outcome: Progressing 02/25/2024 2033 by Marvis Kenneth SAILOR, RN Outcome: Progressing   Problem: Health Behavior/Discharge Planning: Goal: Ability to manage health-related needs will improve 02/26/2024 0615 by Marvis Kenneth SAILOR, RN Outcome: Progressing 02/25/2024 2033 by Marvis Kenneth SAILOR, RN Outcome: Progressing   Problem: Clinical Measurements: Goal: Ability to maintain clinical measurements within normal limits will improve 02/26/2024 0615 by Marvis Kenneth SAILOR, RN Outcome: Progressing 02/25/2024 2033 by Marvis Kenneth SAILOR, RN Outcome: Progressing Goal: Will remain free from infection 02/26/2024 0615 by Marvis Kenneth SAILOR, RN Outcome: Progressing 02/25/2024 2033 by Marvis Kenneth SAILOR, RN Outcome: Progressing Goal: Diagnostic test results will improve 02/26/2024 0615 by Marvis Kenneth SAILOR, RN Outcome: Progressing 02/25/2024 2033 by Marvis Kenneth SAILOR, RN Outcome: Progressing Goal: Respiratory complications will improve 02/26/2024 0615 by Marvis Kenneth SAILOR, RN Outcome: Progressing 02/25/2024 2033 by Marvis Kenneth SAILOR, RN Outcome: Progressing Goal: Cardiovascular complication will be avoided 02/26/2024 0615 by Marvis Kenneth SAILOR, RN Outcome: Progressing 02/25/2024 2033 by Marvis Kenneth SAILOR, RN Outcome: Progressing   Problem: Activity: Goal: Risk for activity intolerance will decrease 02/26/2024 0615 by Marvis Kenneth SAILOR, RN Outcome: Progressing 02/25/2024 2033 by Marvis Kenneth SAILOR, RN Outcome: Progressing   Problem: Nutrition: Goal: Adequate nutrition will be maintained 02/26/2024 0615 by Marvis Kenneth SAILOR, RN Outcome: Progressing 02/25/2024 2033 by Marvis Kenneth SAILOR, RN Outcome: Progressing   Problem: Coping: Goal: Level of anxiety will  decrease 02/26/2024 0615 by Marvis Kenneth SAILOR, RN Outcome: Progressing 02/25/2024 2033 by Marvis Kenneth SAILOR, RN Outcome: Progressing   Problem: Elimination: Goal: Will not experience complications related to bowel motility 02/26/2024 0615 by Marvis Kenneth SAILOR, RN Outcome: Progressing 02/25/2024 2033 by Marvis Kenneth SAILOR, RN Outcome: Progressing Goal: Will not experience complications related to urinary retention 02/26/2024 0615 by Marvis Kenneth SAILOR, RN Outcome: Progressing 02/25/2024 2033 by Marvis Kenneth SAILOR, RN Outcome: Progressing   Problem: Pain Managment: Goal: General experience of comfort will improve and/or be controlled 02/26/2024 0615 by Marvis Kenneth SAILOR, RN Outcome: Progressing 02/25/2024 2033 by Marvis Kenneth SAILOR, RN Outcome: Progressing   Problem: Safety: Goal: Ability to remain free from injury will improve 02/26/2024 0615 by Marvis Kenneth SAILOR, RN Outcome: Progressing 02/25/2024 2033 by Marvis Kenneth SAILOR, RN Outcome: Progressing   Problem: Skin Integrity: Goal: Risk for impaired skin integrity will decrease 02/26/2024 0615 by Marvis Kenneth SAILOR, RN Outcome: Progressing 02/25/2024 2033 by Marvis Kenneth SAILOR, RN Outcome: Progressing

## 2024-02-26 NOTE — Progress Notes (Signed)
 4 Days Post-Op  Subjective: CC: Mild Tachy overnight. Improved without intervention to low 100's. . No complaints. No cp or sob. Tolerating po and tf's without n/v. BM yuesterday.  Voiding with good uop on I/O.   Afebrile. No hypotension. On RA.   Clemens out of bed yesterday pm  Objective: Vital signs in last 24 hours: Temp:  [97.1 F (36.2 C)-98.4 F (36.9 C)] 97.5 F (36.4 C) (06/22 1100) Pulse Rate:  [96-109] 109 (06/22 1100) Resp:  [14-18] 14 (06/22 1100) BP: (97-121)/(52-71) 109/52 (06/22 1100) SpO2:  [94 %-99 %] 94 % (06/22 1100) Weight:  [55.5 kg] 55.5 kg (06/22 0500) Last BM Date : 02/25/24  Intake/Output from previous day: 06/21 0701 - 06/22 0700 In: -  Out: 750 [Urine:750] Intake/Output this shift: No intake/output data recorded.  PE: Gen:  Alert, NAD, pleasantly confused Card:  mild Tachycardic. Appears sinus tach on monitor.  Pulm: CTA b/l. Rate and effort normal Abd: Soft, ND, NT. PEG tube in place, currently clamped, site cdi Ext:  No LE edema Neuro:  MAE's, f/c, non-focal  Lab Results:  No results for input(s): WBC, HGB, HCT, PLT in the last 72 hours. BMET Recent Labs    02/25/24 0847 02/26/24 0714  NA 136 135  K 5.1 4.9  CL 103 102  CO2 21* 21*  GLUCOSE 112* 134*  BUN 129* 141*  CREATININE 2.72* 2.79*  CALCIUM 10.6* 10.8*   PT/INR No results for input(s): LABPROT, INR in the last 72 hours. CMP     Component Value Date/Time   NA 135 02/26/2024 0714   K 4.9 02/26/2024 0714   CL 102 02/26/2024 0714   CO2 21 (L) 02/26/2024 0714   GLUCOSE 134 (H) 02/26/2024 0714   BUN 141 (H) 02/26/2024 0714   CREATININE 2.79 (H) 02/26/2024 0714   CALCIUM 10.8 (H) 02/26/2024 0714   PROT 5.8 (L) 02/13/2024 0531   ALBUMIN 2.4 (L) 02/26/2024 0714   AST 24 02/13/2024 0531   ALT 24 02/13/2024 0531   ALKPHOS 133 (H) 02/13/2024 0531   BILITOT 0.3 02/13/2024 0531   GFRNONAA 24 (L) 02/26/2024 0714   Lipase  No results found for:  LIPASE  Studies/Results: No results found.  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 Joshua  Loaded with Keppra  in ED, completed 1 week BID keppra , F/U CT head slightly increased SDH but exam stable. TBI therapies.  Bilateral rib fractures that were sustained in a fall on 01/21/2024, similar on follow-up CT scan - multimodal pain control, pulm toilet,  Acute hypoxic respiratory failure - self-extubated, weaned back to room air, continue flutter, chest PT, duonebs EtoH abuse and withdrawal - CIWA. MVI, thiamine , folic acid . No longer requiring dex or ativan . Tachycardia - Cards has seen and signed off. EKG. Cont metoprolol .  FEN: S/p PEG 6/18. Nocturnal TF's. Passed for Reg diet with thin liquids with SLP. Renal diet. Ecouraged PO CKD/AKI - appreciate Nephrology F/U. S/p Lokelma  6/19 for hyperkalemia. Cont low potassium diet and nepro for TF's. Hyperkalemia resolved at 4.9 on last check.   VTE: PAS, SQH Endo: low dose LT4 ID: None Foley: none Dispo: 4NP, therapies. Eval by psych and deemed not to have capacity for decision-making regarding PEG placement and SNF placement. He has limited recollection of the extensive conversation held yesterday regarding these two  topics. Palliative also engaged. No HCPOA, legal spouse identified. Per palliative note, Patients oldest son, Jeniel and sister, [name] have been appointed by family to make decisions. Plan for SNF. Medically stable for d/c to SNF. Reportedly a bed avail on monday   I reviewed nursing notes, last 24 h vitals and pain scores, last 48 h intake and output, last 24 h labs and trends, and last 24 h imaging results   LOS: 25 days    Camellia Blush, MD [facility] Surgery 02/26/2024, 12:29 PM Please see Amion for pager number  during day hours 7:00am-4:30pm

## 2024-02-27 LAB — RENAL FUNCTION PANEL
Albumin: 2.4 g/dL — ABNORMAL LOW (ref 3.5–5.0)
Anion gap: 14 (ref 5–15)
BUN: 152 mg/dL — ABNORMAL HIGH (ref 8–23)
CO2: 23 mmol/L (ref 22–32)
Calcium: 11.2 mg/dL — ABNORMAL HIGH (ref 8.9–10.3)
Chloride: 99 mmol/L (ref 98–111)
Creatinine, Ser: 2.86 mg/dL — ABNORMAL HIGH (ref 0.61–1.24)
GFR, Estimated: 23 mL/min — ABNORMAL LOW (ref >60)
Glucose, Bld: 119 mg/dL — ABNORMAL HIGH (ref 70–99)
Phosphorus: 4.4 mg/dL (ref 2.5–4.6)
Potassium: 5.3 mmol/L — ABNORMAL HIGH (ref 3.5–5.1)
Sodium: 136 mmol/L (ref 135–145)

## 2024-02-27 LAB — GLUCOSE, CAPILLARY
Glucose-Capillary: 128 mg/dL — ABNORMAL HIGH (ref 70–99)
Glucose-Capillary: 124 mg/dL — ABNORMAL HIGH (ref 70–99)
Glucose-Capillary: 98 mg/dL (ref 70–99)

## 2024-02-27 MED ORDER — ACETAMINOPHEN 325 MG PO TABS: 650.0000 mg | ORAL_TABLET | Freq: Four times a day (QID) | ORAL | Status: DC

## 2024-02-27 MED ORDER — ACETAMINOPHEN 160 MG/5ML PO SOLN
650.0000 mg | Freq: Four times a day (QID) | ORAL | Status: DC
  Administered 2024-02-27 – 2024-03-02 (×13): 650 mg
  Filled 2024-02-27 (×15): qty 20.3

## 2024-02-27 MED ORDER — METHOCARBAMOL 500 MG PO TABS: 500.0000 mg | ORAL_TABLET | Freq: Four times a day (QID) | ORAL | Status: DC | PRN

## 2024-02-27 MED ORDER — SODIUM ZIRCONIUM CYCLOSILICATE 5 G PO PACK
5.0000 g | PACK | Freq: Once | ORAL | Status: AC
  Administered 2024-02-27: 5 g
  Filled 2024-02-27 (×2): qty 1

## 2024-02-27 NOTE — Progress Notes (Signed)
   02/27/24 1243  Hand-off documentation  Hand-off Given Given to Transfer Unit/facility (4NP to 6N)  Report given to (Full Name) Reggy ORN. RN    SBAR handoff/ report called to receiving unit 6N. Spoke with Production assistant, radio.  Patient to transfer to 6N 22.

## 2024-02-27 NOTE — Progress Notes (Signed)
 Transported via bed to new room on 6N, room [id number].   Escorted by RN and NT Renee.  Handoff given to [employer] and [employer].  Left with call bell within reach, bed in lowest position, bed alarm on, and floor mats x2 in place.

## 2024-02-27 NOTE — TOC Progression Note (Addendum)
 Transition of Care [facility]) - Progression Note    Patient Details  Name: [name] MRN: [id number] Date of Birth: 08-18-1951  Transition of Care [facility]) CM/SW Contact  Shavar Gorka E Raijon Lindfors, LCSW Phone Number: 02/27/2024, 9:04 AM  Clinical Narrative:    CSW reached out to [place] and [place] at [employer] - requested update on insurance authorization.  Per Therisa barrows is still pending.   Expected Discharge Plan: Skilled Nursing Facility Barriers to Discharge: Continued Medical Work up  Expected Discharge Plan and Services In-house Referral: Clinical Social Work     Living arrangements for the past 2 months: Single Family Home                                       Social Determinants of Health (SDOH) Interventions SDOH Screenings   Food Insecurity: No Food Insecurity (02/10/2024)  Housing: Low Risk  (02/10/2024)  Transportation Needs: No Transportation Needs (02/10/2024)  Utilities: Not At Risk (02/10/2024)  Social Connections: Moderately Integrated (02/10/2024)    Readmission Risk Interventions     No data to display

## 2024-02-27 NOTE — Progress Notes (Signed)
 Physical Therapy Treatment Patient Details Name: [name] MRN: [id number] DOB: 09/22/1950 Today's Date: 02/27/2024   History of Present Illness 73 yo male presenting to ED 5/28 after being found seizing after falling down stairs. CTH bil SDH and SAH with trace IVH and non-depressed occipital skull fx. Intubated 5/28, self-extubated 5/30. Recently sustained bilateral rib fxs after a fall 5/17. Imaging also showed stable subacute fractures involving the right sacral ala, right superior and inferior rami, and left parasymphyseal region along with stable superior endplate compression deformities at T3, T4, and T5 since 01/21/2024. S/p EGD and PEG tube placement 6/18. PMH includes ETOH abuse s/p two liver transplants, 5/17 fall with R sacral ala superior and inferior rami fx, L superior endplate compression deformities at T3 T4 T5    PT Comments  Pt up in chair upon PT arrival, states I want to get back to bed but agreeable to PT session prior. Pt ambulatory for short hallway distance with max safety cuing and light steadying assist. At midpoint pt slowing significantly, states it is due to needing to urinate but suspect also due to fatigue. VSS throughout. Pt declines further intervention s/p gait, plan remains appropriate.      If plan is discharge home, recommend the following: Assistance with cooking/housework;Assist for transportation;Help with stairs or ramp for entrance;Supervision due to cognitive status;A little help with walking and/or transfers;A little help with bathing/dressing/bathroom;Direct supervision/assist for medications management;Direct supervision/assist for financial management   Can travel by private vehicle        Equipment Recommendations       Recommendations for Other Services       Precautions / Restrictions Precautions Precautions: Fall Recall of Precautions/Restrictions: Impaired Precaution/Restrictions Comments: PEG tube, abdominal  binder Restrictions Weight Bearing Restrictions Per Provider Order: No Other Position/Activity Restrictions: Dr. Paola confirmed no spinal precautions or weight bearing restrictions 5/31     Mobility  Bed Mobility Overal bed mobility: Needs Assistance Bed Mobility: Sit to Supine       Sit to supine: Min assist, Used rails   General bed mobility comments: assist for LE lift into bed    Transfers Overall transfer level: Needs assistance Equipment used: Rolling walker (2 wheels) Transfers: Sit to/from Stand Sit to Stand: Min assist           General transfer comment: light rise and steady assist, cues for hand placement when rising/sitting.    Ambulation/Gait Ambulation/Gait assistance: Min assist Gait Distance (Feet): 85 Feet Assistive device: Rolling walker (2 wheels) Gait Pattern/deviations: Decreased stride length, Step-through pattern, Trunk flexed, Narrow base of support Gait velocity: decr     General Gait Details: light assist for balance and RW management, cues for upright posture and proximity of RW multiple times   Stairs             Wheelchair Mobility     Tilt Bed    Modified Rankin (Stroke Patients Only)       Balance Overall balance assessment: Needs assistance Sitting-balance support: No upper extremity supported, Feet supported Sitting balance-Leahy Scale: Good     Standing balance support: Bilateral upper extremity supported, During functional activity, Reliant on assistive device for balance Standing balance-Leahy Scale: Poor Standing balance comment: reliant on RW                            Communication Communication Communication: Impaired Factors Affecting Communication: Hearing impaired  Cognition Arousal: Alert Behavior During Therapy: Flat affect  PT - Cognitive impairments: Awareness, Memory, Attention, Initiation, Safety/Judgement, Problem solving                   Rancho Levels of Cognitive  Functioning Rancho Los Amigos Scales of Cognitive Functioning: Automatic, Appropriate: Minimal Assistance for Daily Living Skills Rancho Los Amigos Scales of Cognitive Functioning: Automatic, Appropriate: Minimal Assistance for Daily Living Skills [VII] PT - Cognition Comments: PT set up time to come see pt, pt agrees. When PT came back 30 minutes later, pt states some girl was just in here, and she is going to help me walk in a little bit. PT assured pt that it was this PT, pt does not believe it. Requires safety cues throughout mobility, poor insight into deficits. Following commands: Impaired Following commands impaired: Only follows one step commands consistently, Follows one step commands with increased time    Cueing Cueing Techniques: Verbal cues, Tactile cues, Visual cues  Exercises      General Comments        Pertinent Vitals/Pain Pain Assessment Pain Assessment: Faces Faces Pain Scale: Hurts little more Pain Location: LEs Pain Descriptors / Indicators: Other (Comment) (stiffness) Pain Intervention(s): Limited activity within patient's tolerance, Monitored during session, Repositioned    Home Living                          Prior Function            PT Goals (current goals can now be found in the care plan section) Acute Rehab PT Goals Patient Stated Goal: to go home PT Goal Formulation: With patient Time For Goal Achievement: 03/01/24 Potential to Achieve Goals: Good Progress towards PT goals: Progressing toward goals    Frequency    Min 3X/week      PT Plan      Co-evaluation              AM-PAC PT 6 Clicks Mobility   Outcome Measure  Help needed turning from your back to your side while in a flat bed without using bedrails?: A Little Help needed moving from lying on your back to sitting on the side of a flat bed without using bedrails?: A Little Help needed moving to and from a bed to a chair (including a wheelchair)?: A  Little Help needed standing up from a chair using your arms (e.g., wheelchair or bedside chair)?: A Little Help needed to walk in hospital room?: A Little Help needed climbing 3-5 steps with a railing? : A Lot 6 Click Score: 17    End of Session   Activity Tolerance: Patient tolerated treatment well Patient left: in bed;with call bell/phone within reach;with bed alarm set Nurse Communication: Mobility status PT Visit Diagnosis: Unsteadiness on feet (R26.81);Muscle weakness (generalized) (M62.81);Other symptoms and signs involving the nervous system (R29.898);Other abnormalities of gait and mobility (R26.89);Difficulty in walking, not elsewhere classified (R26.2)     Time: 8945-8893 PT Time Calculation (min) (ACUTE ONLY): 12 min  Charges:    $Gait Training: 8-22 mins PT General Charges $$ ACUTE PT VISIT: 1 Visit                     Johana RAMAN, PT DPT Acute Rehabilitation Services Secure Chat Preferred  Office [phone]    Ayce Pietrzyk E Johna 02/27/2024, 1:31 PM

## 2024-02-27 NOTE — Progress Notes (Addendum)
 Patient with ordered sodium zirconium cyclosilicate  (LOKELMA ) packet 5 g this morning at 1145.   Retimed dose to 1400 due to needing to give AM medications via tube and per admin instructions:   Dissolve each packet in at least 45 mL of water , stir well, and administer immediately.  Administer at least 2 hours before or after other oral medications. For per tube admin, continuously shake syringe to prevent settlement. Flush tube well after admin.

## 2024-02-27 NOTE — Progress Notes (Signed)
 Progress Note  5 Days Post-Op  Subjective: Pt denies complaints this AM. Sitting up in chair. Awaiting SNF placement.   Objective: Vital signs in last 24 hours: Temp:  [97.5 F (36.4 C)-98.4 F (36.9 C)] 97.7 F (36.5 C) (06/23 0825) Pulse Rate:  [92-109] 100 (06/23 0825) Resp:  [14-18] 17 (06/23 0825) BP: (103-118)/(52-81) 118/81 (06/23 0825) SpO2:  [94 %-96 %] 95 % (06/23 0825) Weight:  [55.5 kg] 55.5 kg (06/23 0403) Last BM Date : 02/26/24  Intake/Output from previous day: 06/22 0701 - 06/23 0700 In: -  Out: 1200 [Urine:1200] Intake/Output this shift: No intake/output data recorded.  PE: Gen:  Alert, NAD, oriented x4 this AM Card:  mild Tachycardic. Appears sinus tach on monitor.  Pulm: CTA b/l. Rate and effort normal Abd: Soft, ND, NT. PEG tube in place, currently clamped, site cdi Ext:  No LE edema Neuro:  MAE's, f/c, non-focal   Lab Results:  No results for input(s): WBC, HGB, HCT, PLT in the last 72 hours. BMET Recent Labs    02/26/24 0714 02/27/24 0658  NA 135 136  K 4.9 5.3*  CL 102 99  CO2 21* 23  GLUCOSE 134* 119*  BUN 141* 152*  CREATININE 2.79* 2.86*  CALCIUM 10.8* 11.2*   PT/INR No results for input(s): LABPROT, INR in the last 72 hours. CMP     Component Value Date/Time   NA 136 02/27/2024 0658   K 5.3 (H) 02/27/2024 0658   CL 99 02/27/2024 0658   CO2 23 02/27/2024 0658   GLUCOSE 119 (H) 02/27/2024 0658   BUN 152 (H) 02/27/2024 0658   CREATININE 2.86 (H) 02/27/2024 0658   CALCIUM 11.2 (H) 02/27/2024 0658   PROT 5.8 (L) 02/13/2024 0531   ALBUMIN 2.4 (L) 02/27/2024 0658   AST 24 02/13/2024 0531   ALT 24 02/13/2024 0531   ALKPHOS 133 (H) 02/13/2024 0531   BILITOT 0.3 02/13/2024 0531   GFRNONAA 23 (L) 02/27/2024 0658   Lipase  No results found for: LIPASE     Studies/Results: No results found.  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 Joshua  Loaded with Keppra  in ED, completed 1 week BID keppra , F/U CT head slightly increased SDH but exam stable. TBI therapies.  Bilateral rib fractures that were sustained in a fall on 01/21/2024, similar on follow-up CT scan - multimodal pain control, pulm toilet,  Acute hypoxic respiratory failure - self-extubated, weaned back to room air, continue flutter, chest PT, duonebs EtoH abuse and withdrawal - CIWA. MVI, thiamine , folic acid . No longer requiring dex or ativan . Tachycardia - Cards has seen and signed off. EKG. Cont metoprolol .  CKD/AKI - appreciate Nephrology F/U. S/p Lokelma  6/19 for hyperkalemia. Cont low potassium diet and nepro for TF's. Hyperkalemia 5.3 this AM, give lokelma  x1.   FEN: S/p PEG 6/18. Nocturnal TF's. Passed for Reg diet with thin liquids with SLP. Renal diet. Ecouraged PO  VTE: PAS, SQH Endo: low dose LT4 ID: None Foley: none Dispo: 4NP, therapies. Eval by psych and deemed not to have capacity for decision-making regarding PEG placement and SNF placement. Palliative also engaged. No HCPOA, legal spouse identified. Per palliative note, Patients oldest son, Jeffory and sister, [name] have been appointed by family to make decisions. Plan for SNF. Medically stable for d/c to SNF.  LOS: 26 days   I reviewed nursing notes, last 24 h vitals and pain scores, last 48 h intake and output, last 24 h labs and trends, and last 24 h imaging results.  This care required moderate level of medical decision making.    [name], [facility] 02/27/2024, 10:16 AM Please see Amion for pager number during day hours 7:00am-4:30pm

## 2024-02-27 NOTE — Discharge Summary (Signed)
 Physician Discharge Summary  Patient ID: [name] MRN: [id number] DOB/AGE: Mar 17, 1951 73 y.o.  Admit date: 02/01/2024 Discharge date: 03/02/2024  Discharge Diagnoses Fall down stairs  Bilateral SDH Anterior SAH Midline non-depressed occipital protuberance fracture tracking into posterior foramen magnum Bilateral rib fractures, sustained on fall 01/21/24 Acute hypoxic respiratory failure, resolved EtOH abuse and withdrawal Tachycardia  AKI on CKD  Consultants Neurosurgery Cardiology Psychiatry Palliative  Nephrology   Procedures PEG placement - 02/22/24 Dr. Dreama Hanger  HPI: Patient is a 73 year old male with history of recent fall with rib fractures who was found at the bottom of the stairs after another suspected fall. He was witnessed to be seizing. EMS was called and level 1 trauma activated. He was given 15 mg versed en route. On arrival GCS was 3 and he was intubated by the ED provider. No other history initially available. 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 He was admitted to the trauma ICU.   Hospital Course: Neurosurgery consulted and recommended non-operative management with follow up CTH in the morning. Follow up CTH showed slightly increased SDH but patient clinically stable so continued non-operative management. Patient self-extubated 5/30 but was tolerating well and was weaned to room air. Transferred out of ICU 5/30. Cleared for dysphagia diet by SLP on 5/30, Cortrak placed for supplemental feeds and medications. Patient with tachycardia and increased oxygen requirements 6/2, respiratory status improved with aggressive pulmonary toilet. Noted to be in Atrial fibrillation with RVR 6/3 and cardiology consulted. Patient converted back into sinus rhythm and tachycardia improved on beta blocker.  Patient worked with therapies during admission.  SLP advance patient to regular diet.  However p.o. intake was not adequate  for nutritional needs. Therapies recommended for SNF. Patient evaluated a psych and deemed not to have capacity for decision-making regarding PEG placement and SNF placement. Palliative also engaged. No HCPOA, legal spouse identified. Per palliative note, Patients oldest son, Eloise and sister, [name] have been appointed by family to make decisions. Patient underwent PEG placement on 6/18. Nephrology followed during admission for acute on chronic kidney disease and hyperkalemia. He was found to have UTI w/ Ucx w/  Kleb PNA and placed on abx which he will continue at d/c. On On 6/27, the patient was voiding well, tolerating diet and TF's, working with therapies,  pain well controlled, vital signs stable and felt stable for discharge to SNF. Follow up as noted below.    Allergies as of 03/02/2024       Reactions   Tape Other (See Comments)   Skin tears    Zolpidem Other (See Comments)   confusion        Medication List     PAUSE taking these medications    buPROPion 150 MG 12 hr tablet Wait to take this until your doctor or other care provider tells you to start again. Commonly known as: WELLBUTRIN SR Take 150 mg by mouth 2 (two) times daily.   traZODone 100 MG tablet Wait to take this until your doctor or other care provider tells you to start again. Commonly known as: DESYREL Take 100 mg by mouth at bedtime.       STOP taking these medications    oxyCODONE  5 MG immediate release tablet Commonly known as: Oxy IR/ROXICODONE    Stimulant Laxative 8.6-50 MG tablet Generic drug: senna-docusate   traMADol 50 MG tablet Commonly known as: ULTRAM       TAKE these medications  acetaminophen  325 MG tablet Commonly known as: TYLENOL  Take 2 tablets (650 mg total) by mouth every 6 (six) hours as needed.   ascorbic acid  500 MG tablet Commonly known as: VITAMIN C  Take 1 tablet (500 mg total) by mouth 3 (three) times daily.   cephALEXin  500 MG capsule Commonly known as:  KEFLEX  Take 1 capsule (500 mg total) by mouth every 12 (twelve) hours.   docusate sodium  100 MG capsule Commonly known as: COLACE Take 1 capsule (100 mg total) by mouth 2 (two) times daily as needed for mild constipation.   famotidine  40 MG tablet Commonly known as: PEPCID  Take 40 mg by mouth at bedtime.   ferrous sulfate  325 (65 FE) MG tablet Take 1 tablet (325 mg total) by mouth 3 (three) times daily with meals.   folic acid  1 MG tablet Commonly known as: FOLVITE  Take 1 mg by mouth daily.   ipratropium-albuterol  0.5-2.5 (3) MG/3ML Soln Commonly known as: DUONEB Take 3 mLs by nebulization every 4 (four) hours as needed.   levothyroxine  25 MCG tablet Commonly known as: SYNTHROID  Take 1 tablet (25 mcg total) by mouth daily at 6 (six) AM. Start taking on: March 03, 2024   liver oil-zinc  oxide 40 % ointment Commonly known as: DESITIN Apply topically 2 (two) times daily.   magnesium oxide 400 (240 Mg) MG tablet Commonly known as: MAG-OX Take 1 tablet by mouth 2 (two) times daily.   methocarbamol  500 MG tablet Commonly known as: ROBAXIN  Take 1 tablet (500 mg total) by mouth every 6 (six) hours as needed for muscle spasms. What changed:  when to take this reasons to take this   metoprolol  tartrate 25 MG tablet Commonly known as: LOPRESSOR  Take 0.5 tablets (12.5 mg total) by mouth 2 (two) times daily.   mirtazapine  15 MG disintegrating tablet Commonly known as: REMERON  SOL-TAB Take 0.5 tablets (7.5 mg total) by mouth at bedtime.   multivitamin with minerals Tabs tablet Take 1 tablet by mouth daily.   ondansetron  4 MG tablet Commonly known as: ZOFRAN  Take 4 mg by mouth every 6 (six) hours as needed.   polyethylene glycol 17 g packet Commonly known as: MIRALAX  / GLYCOLAX  Take 17 g by mouth daily as needed.   sodium bicarbonate  650 MG tablet Take 1 tablet (650 mg total) by mouth 2 (two) times daily.   tacrolimus  1 MG capsule Commonly known as: PROGRAF  Take 1 mg  by mouth every 12 (twelve) hours.   tamsulosin  0.4 MG Caps capsule Commonly known as: FLOMAX  Take 0.4 mg by mouth daily after supper.   thiamine  100 MG tablet Commonly known as: Vitamin B-1 Take 1 tablet (100 mg total) by mouth daily. Start taking on: March 03, 2024          Contact information for follow-up providers     Joshua, Alm Hamilton, MD Follow up.   Specialty: Neurosurgery Why: As needed for questions regarding recent TBI and skull fracture Contact information: [address] [place] [address] [phone]         Billy Philippe SAUNDERS, NP. Schedule an appointment as soon as possible for a visit.   Specialty: Family Medicine Why: to establish primary care upon discharge from rehab facility Contact information: [address] [place] [address] [phone]         Pa, [facility]. Call.   Why: For follow up Contact information: [address] [place] [address] [phone]  Contact information for after-discharge care     Destination     [facility] .   Service: Skilled Nursing Contact information: [address] [place]  [address] [phone]                     Signed: Ozell CHRISTELLA Shaper , [facility] 03/02/2024, 11:58 AM Please see Amion for pager number during day hours 7:00am-4:30pm

## 2024-02-28 ENCOUNTER — Inpatient Hospital Stay (HOSPITAL_COMMUNITY): Payer: Medicare (Managed Care)

## 2024-02-28 LAB — RENAL FUNCTION PANEL
Albumin: 2.8 g/dL — ABNORMAL LOW (ref 3.5–5.0)
Anion gap: 16 — ABNORMAL HIGH (ref 5–15)
BUN: 151 mg/dL — ABNORMAL HIGH (ref 8–23)
CO2: 21 mmol/L — ABNORMAL LOW (ref 22–32)
Calcium: 11.5 mg/dL — ABNORMAL HIGH (ref 8.9–10.3)
Chloride: 98 mmol/L (ref 98–111)
Creatinine, Ser: 3.2 mg/dL — ABNORMAL HIGH (ref 0.61–1.24)
GFR, Estimated: 20 mL/min — ABNORMAL LOW (ref >60)
Glucose, Bld: 124 mg/dL — ABNORMAL HIGH (ref 70–99)
Phosphorus: 5.2 mg/dL — ABNORMAL HIGH (ref 2.5–4.6)
Potassium: 5.5 mmol/L — ABNORMAL HIGH (ref 3.5–5.1)
Sodium: 135 mmol/L (ref 135–145)

## 2024-02-28 LAB — GLUCOSE, CAPILLARY
Glucose-Capillary: 101 mg/dL — ABNORMAL HIGH (ref 70–99)
Glucose-Capillary: 113 mg/dL — ABNORMAL HIGH (ref 70–99)
Glucose-Capillary: 119 mg/dL — ABNORMAL HIGH (ref 70–99)
Glucose-Capillary: 121 mg/dL — ABNORMAL HIGH (ref 70–99)
Glucose-Capillary: 151 mg/dL — ABNORMAL HIGH (ref 70–99)
Glucose-Capillary: 96 mg/dL (ref 70–99)

## 2024-02-28 LAB — URINALYSIS, ROUTINE W REFLEX MICROSCOPIC
Bilirubin Urine: NEGATIVE
Glucose, UA: NEGATIVE mg/dL
Hgb urine dipstick: NEGATIVE
Ketones, ur: NEGATIVE mg/dL
Nitrite: NEGATIVE
Protein, ur: 30 mg/dL — AB
Specific Gravity, Urine: 1.013 (ref 1.005–1.030)
WBC, UA: >50 WBC/hpf (ref 0–5)
pH: 5 (ref 5.0–8.0)

## 2024-02-28 LAB — CREATININE, URINE, RANDOM: Creatinine, Urine: 55 mg/dL

## 2024-02-28 LAB — SODIUM, URINE, RANDOM: Sodium, Ur: 49 mmol/L

## 2024-02-28 MED ORDER — SODIUM BICARBONATE 650 MG PO TABS
650.0000 mg | ORAL_TABLET | Freq: Two times a day (BID) | ORAL | Status: DC
  Administered 2024-02-28 – 2024-03-06 (×16): 650 mg via ORAL
  Filled 2024-02-28 (×16): qty 1

## 2024-02-28 MED ORDER — SODIUM CHLORIDE 0.9 % IV SOLN: INTRAVENOUS | Status: AC

## 2024-02-28 MED ORDER — SODIUM ZIRCONIUM CYCLOSILICATE 10 G PO PACK
10.0000 g | PACK | Freq: Four times a day (QID) | ORAL | Status: AC
  Administered 2024-02-28 (×2): 10 g via ORAL
  Filled 2024-02-28 (×2): qty 1

## 2024-02-28 NOTE — Progress Notes (Signed)
 Occupational Therapy Treatment Patient Details Name: [name] MRN: [id number] DOB: Feb 23, 1951 Today's Date: 02/28/2024   History of present illness 73 yo male presenting to ED 5/28 after being found seizing after falling down stairs. CTH bil SDH and SAH with trace IVH and non-depressed occipital skull fx. Intubated 5/28, self-extubated 5/30. Recently sustained bilateral rib fxs after a fall 5/17. Imaging also showed stable subacute fractures involving the right sacral ala, right superior and inferior rami, and left parasymphyseal region along with stable superior endplate compression deformities at T3, T4, and T5 since 01/21/2024. S/p EGD and PEG tube placement 6/18. PMH includes ETOH abuse s/p two liver transplants, 5/17 fall with R sacral ala superior and inferior rami fx, L superior endplate compression deformities at T3 T4 T5   OT comments  Pt making good progress with functional  goals. Upon OT arrival pt in bed and agreeable to OOB activity. Upon pulling covers back, pt's bed pads soiled with urine and pt unaware. Pt sat EOB min A, min A sit - stand to RW  and CGA to walk to bathroom, CGA for toilet transfer, min A for LB bathing/peri hygiene standing at commode, CGA standing at sink for grooming/hygiene. OT replaced soaked pads with clean, dry pad while pt seated sitting where he participated in UB dressing tasks. Pt adamantly declined staying up in chair and wanting to go back to bed, CGA to SPT back to bed with min A to assist LEs. OT will continue to follow acutely to maximize level of function and safety      If plan is discharge home, recommend the following:  A little help with walking and/or transfers;A little help with bathing/dressing/bathroom;Assistance with cooking/housework;Direct supervision/assist for medications management;Direct supervision/assist for financial management;Help with stairs or ramp for entrance;Assist for transportation   Equipment Recommendations  Other  (comment) (defer)    Recommendations for Other Services      Precautions / Restrictions Precautions Precautions: Fall Recall of Precautions/Restrictions: Impaired Precaution/Restrictions Comments: PEG tube, abdominal binder Restrictions Weight Bearing Restrictions Per Provider Order: No Other Position/Activity Restrictions: Dr. Paola confirmed no spinal precautions or weight bearing restrictions 5/31       Mobility Bed Mobility Overal bed mobility: Needs Assistance Bed Mobility: Supine to Sit, Sit to Supine   Sidelying to sit: Min assist, Used rails   Sit to supine: Min assist, Used rails   General bed mobility comments: assist for LE mgt    Transfers Overall transfer level: Needs assistance Equipment used: Rolling walker (2 wheels) Transfers: Sit to/from Stand Sit to Stand: Min assist, Contact guard assist           General transfer comment: cue for hand placement     Balance Overall balance assessment: Needs assistance Sitting-balance support: No upper extremity supported, Feet supported Sitting balance-Leahy Scale: Good     Standing balance support: Bilateral upper extremity supported, During functional activity, Reliant on assistive device for balance Standing balance-Leahy Scale: Poor                             ADL either performed or assessed with clinical judgement   ADL Overall ADL's : Needs assistance/impaired     Grooming: Wash/dry hands;Wash/dry face;Contact guard assist;Standing;Cueing for safety   Upper Body Bathing: Contact guard assist;Sitting   Lower Body Bathing: Minimal assistance;Sitting/lateral leans;Cueing for safety   Upper Body Dressing : Contact guard assist;Sitting       Toilet Transfer: Minimal assistance;Contact guard  assist;Rolling walker (2 wheels);Ambulation;Cueing for safety;Regular Toilet;Grab bars   Toileting- Clothing Manipulation and Hygiene: Minimal assistance;Sit to/from stand;Cueing for safety        Functional mobility during ADLs: Minimal assistance;Contact guard assist;Rolling walker (2 wheels);Cueing for safety General ADL Comments: cues for hand placment    Extremity/Trunk Assessment Upper Extremity Assessment Upper Extremity Assessment: Generalized weakness   Lower Extremity Assessment Lower Extremity Assessment: Defer to PT evaluation   Cervical / Trunk Assessment Cervical / Trunk Assessment: Kyphotic    Vision Ability to See in Adequate Light: 0 Adequate Patient Visual Report: No change from baseline     Perception     Praxis     Communication Communication Communication: Impaired Factors Affecting Communication: Hearing impaired   Cognition Arousal: Alert Behavior During Therapy: Flat affect                                 Following commands: Impaired Following commands impaired: Only follows one step commands consistently, Follows one step commands with increased time      Cueing   Cueing Techniques: Verbal cues, Tactile cues, Visual cues  Exercises      Shoulder Instructions       General Comments      Pertinent Vitals/ Pain       Pain Assessment Pain Assessment: Faces Faces Pain Scale: Hurts a little bit Pain Location: LEs Pain Descriptors / Indicators: Aching, Other (Comment) (stiffness) Pain Intervention(s): Monitored during session, Repositioned  Home Living                                          Prior Functioning/Environment              Frequency  Min 2X/week        Progress Toward Goals  OT Goals(current goals can now be found in the care plan section)  Progress towards OT goals: Progressing toward goals     Plan      Co-evaluation                 AM-PAC OT 6 Clicks Daily Activity     Outcome Measure   Help from another person eating meals?: None Help from another person taking care of personal grooming?: A Little Help from another person toileting, which includes  using toliet, bedpan, or urinal?: A Little Help from another person bathing (including washing, rinsing, drying)?: A Little Help from another person to put on and taking off regular upper body clothing?: A Little Help from another person to put on and taking off regular lower body clothing?: A Little 6 Click Score: 19    End of Session Equipment Utilized During Treatment: Rolling walker (2 wheels);Gait belt;Other (comment) (3 in 1)  OT Visit Diagnosis: Unsteadiness on feet (R26.81);Other abnormalities of gait and mobility (R26.89);Muscle weakness (generalized) (M62.81)   Activity Tolerance Patient tolerated treatment well   Patient Left with call bell/phone within reach;in bed;with bed alarm set   Nurse Communication Mobility status        Time: 8670-8644 OT Time Calculation (min): 26 min  Charges: OT General Charges $OT Visit: 1 Visit OT Treatments $Self Care/Home Management : 8-22 mins $Therapeutic Activity: 8-22 mins    Jacques Karna Loose 02/28/2024, 2:34 PM

## 2024-02-28 NOTE — Progress Notes (Signed)
 Patient appears to be confused and trying to get out of the bed. Patient was redirected. Notified [facility] as instructed by charge nurse Orvin. No reply from [facility].

## 2024-02-28 NOTE — Progress Notes (Signed)
 Speech Language Pathology Treatment: Cognitive-Linguistic  Patient Details Name: [name] MRN: [id number] DOB: [birth year] Today's Date: 02/28/2024 Time: 8963-8953 SLP Time Calculation (min) (ACUTE ONLY): 10 min  Assessment / Plan / Recommendation Clinical Impression  Pt was provided instructions and asked to describe the steps to cook his favorite recipe. He was able to list steps consecutively during this verbal task given Mod cueing for organization and sequencing. Pt needed frequent prompting to list the steps sequentially and to provide necessary detail, consistent with the performance of [place] Level VII (automatic, appropriate). He lacks awareness of current deficits and may benefit from continued intervention to target executive functioning and attention through functional tasks. SLP will continue following.    HPI HPI: [name] is a 73 yo male presenting to ED 5/28 after being found seizing after falling down stairs. CTH shows bilateral SDH and SAH with trace IVH and non-depressed occipital skull fx. Intubated 5/28, self-extubated 5/30. Recently sustained bilateral rib fxs after a fall 5/17. MBS 5/30 showed moderate pharyngeal dysphagia which was thought to be partially chronic in nature. There was silent aspiration as pharyngeal residue progressed past the vocal folds. PMH includes ETOH abuse s/p two liver transplants      SLP Plan  Continue with current plan of care          Recommendations                         Frequent or constant Supervision/Assistance Cognitive communication deficit (M58.158)     Continue with current plan of care     Damien Blumenthal, M.A., CCC-SLP Speech Language Pathology, Acute Rehabilitation Services  Secure Chat preferred [phone]   02/28/2024, 11:04 AM

## 2024-02-28 NOTE — TOC Progression Note (Addendum)
 Transition of Care [facility]) - Progression Note    Patient Details  Name: [name] MRN: [id number] Date of Birth: 12/15/1950  [facility] CM/SW Contact  Jionni Helming E Aloysius Heinle, LCSW Phone Number: 02/28/2024, 10:01 AM  Clinical Narrative:    [place] with [employer] reports they are still working on [place].  11:58- Called [place] with [facility] SNFs - she confirms they are working on [place], delay is due to out of state plan.   3:29- [place] with [facility] states once shara is back, they will have to get a one time case agreement (since out of state) which Brittany's legal team and administrator will have to review. She states this may take a few days.  Expected Discharge Plan: Skilled Nursing Facility Barriers to Discharge: Continued Medical Work up  Expected Discharge Plan and Services In-house Referral: Clinical Social Work     Living arrangements for the past 2 months: Single Family Home                                       Social Determinants of Health (SDOH) Interventions SDOH Screenings   Food Insecurity: No Food Insecurity (02/10/2024)  Housing: Low Risk  (02/10/2024)  Transportation Needs: No Transportation Needs (02/10/2024)  Utilities: Not At Risk (02/10/2024)  Social Connections: Moderately Integrated (02/10/2024)    Readmission Risk Interventions     No data to display

## 2024-02-28 NOTE — Progress Notes (Signed)
 Progress Note  6 Days Post-Op  Subjective: Pt denies complaints this AM. Last BM 6/22. Voiding. Awaiting SNF placement.   Objective: Vital signs in last 24 hours: Temp:  [97.4 F (36.3 C)-98.3 F (36.8 C)] 98.3 F (36.8 C) (06/24 0811) Pulse Rate:  [82-105] 87 (06/24 0811) Resp:  [16-17] 16 (06/24 0811) BP: (106-123)/(64-81) 111/69 (06/24 0811) SpO2:  [91 %-99 %] 91 % (06/24 0811) Weight:  [55.5 kg] 55.5 kg (06/24 0500) Last BM Date : 02/26/24  Intake/Output from previous day: 06/23 0701 - 06/24 0700 In: 250  Out: 300 [Urine:300] Intake/Output this shift: No intake/output data recorded.  PE: Gen:  Alert, NAD, oriented x4 this AM Card: Reg rate this morning Pulm: Rate and effort normal Abd: Soft, ND, NT. PEG tube in place, currently clamped, cdi Ext:  No LE edema Neuro:  MAE's, f/c, non-focal  Lab Results:  No results for input(s): WBC, HGB, HCT, PLT in the last 72 hours. BMET Recent Labs    02/26/24 0714 02/27/24 0658  NA 135 136  K 4.9 5.3*  CL 102 99  CO2 21* 23  GLUCOSE 134* 119*  BUN 141* 152*  CREATININE 2.79* 2.86*  CALCIUM 10.8* 11.2*   PT/INR No results for input(s): LABPROT, INR in the last 72 hours. CMP     Component Value Date/Time   NA 136 02/27/2024 0658   K 5.3 (H) 02/27/2024 0658   CL 99 02/27/2024 0658   CO2 23 02/27/2024 0658   GLUCOSE 119 (H) 02/27/2024 0658   BUN 152 (H) 02/27/2024 0658   CREATININE 2.86 (H) 02/27/2024 0658   CALCIUM 11.2 (H) 02/27/2024 0658   PROT 5.8 (L) 02/13/2024 0531   ALBUMIN 2.4 (L) 02/27/2024 0658   AST 24 02/13/2024 0531   ALT 24 02/13/2024 0531   ALKPHOS 133 (H) 02/13/2024 0531   BILITOT 0.3 02/13/2024 0531   GFRNONAA 23 (L) 02/27/2024 0658   Lipase  No results found for: LIPASE     Studies/Results: No results found.  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 Joshua  Loaded with Keppra  in ED, completed 1 week BID keppra , F/U CT head slightly increased SDH but exam stable. TBI therapies.  Bilateral rib fractures that were sustained in a fall on 01/21/2024, similar on follow-up CT scan - multimodal pain control, pulm toilet,  Acute hypoxic respiratory failure - self-extubated, weaned back to room air, continue flutter, chest PT, duonebs EtoH abuse and withdrawal - CIWA. MVI, thiamine , folic acid . No longer requiring dex or ativan . Tachycardia - Cards has seen and signed off. EKG. Cont metoprolol .  CKD/AKI - appreciate Nephrology F/U. S/p Lokelma  6/19 for hyperkalemia. Cont low potassium diet and nepro for TF's. Hyperkalemia 5.3 6/23, given lokelma  x1. Labs pending this am.   FEN: S/p PEG 6/18. Nocturnal TF's. Passed for Reg diet with thin liquids with SLP. Renal diet. Ecouraged PO  VTE: PAS, SQH Endo: low dose LT4 ID: None Foley: none Dispo: 4NP, therapies. Eval by psych and deemed not to have capacity for decision-making regarding PEG placement and SNF placement. Palliative also engaged. No HCPOA, legal spouse identified. Per palliative note, Patients oldest son, Khayree and sister, [name] have been appointed by family to make decisions. Plan for SNF. Medically stable for d/c to SNF.    LOS: 27 days   I  reviewed nursing notes, last 24 h vitals and pain scores, last 48 h intake and output, last 24 h labs and trends, and last 24 h imaging results.  This care required moderate level of medical decision making.    Ozell CHRISTELLA Shaper, [facility] 02/28/2024, 8:23 AM Please see Amion for pager number during day hours 7:00am-4:30pm

## 2024-02-28 NOTE — Progress Notes (Signed)
 Patient agitation resolved after giving medication. Patient asleep.

## 2024-02-28 NOTE — Plan of Care (Signed)
  Problem: Clinical Measurements: Goal: Will remain free from infection Outcome: Progressing   Problem: Activity: Goal: Risk for activity intolerance will decrease Outcome: Progressing   

## 2024-02-28 NOTE — Progress Notes (Signed)
 Patient ID: [name], male   DOB: 1951-04-14, 73 y.o.   MRN: [id number] HPI from original consult dated 02/12/24:  The patient is a 73 y.o. year-old w/ PMH as below who presented after a fall w/ rib fractures, was found by family at the bottom of the stairs after another suspected fall. Pt was seizing. Pt intubated on arrival. Found to have SAH, skull fracture, bilat SDH's, TBI, bilat rib fractures. He suffered etoh withdrawal. He had acute hypoxic resp failure and was on the vent. His renal function has improved from admission, but now the BUN is going up to 100. We are asked to see for renal failure.    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 He did have some kidney problems post transplant and took HD for a while but is not getting it anymore. Pt had been drinking alcohol  again at home.   His AKI/CKD stage IIIb was felt to be related to pre-renal insults and improved with IVFs to a nadir of 1.94 on 02/18/24 and we signed off.  Since then, his BUN/Cr have continued to climb and is up to 151/3.2 along with K of 5.5.  We were re-consulted to further evaluate his recurrent AKI/CKD stage IIIb.  He denies any N/V/D and reports that he has been urinating regularly, although not much documented.  He has not had a UA or a recent bladder scan.  He has been on tacrolimus  since 2004 but no TAC level has been drawn since admission.  He admits that he restarted drinking about a year ago.   O:BP 111/69 (BP Location: Right Arm)   Pulse 87   Temp 98.3 F (36.8 C) (Oral)   Resp 16   Ht 5' 8 (1.727 m)   Wt 55.5 kg   SpO2 91%   BMI 18.60 kg/m   Intake/Output Summary (Last 24 hours) at 02/28/2024 1416 Last data filed at 02/28/2024 1400 Gross per 24 hour  Intake 413 ml  Output 1100 ml  Net -687 ml   Intake/Output: I/O last 3 completed shifts: In: 250 [Other:250] Out: 900 [Urine:900]  Intake/Output this shift:  Total I/O In: 413  [P.O.:413] Out: 800 [Urine:800] Weight change: 0 kg Gen:NAD CVS: RRR Resp:CTA Abd: +BS, soft, NT/ND, PEG tube in place Ext: no edema  Recent Labs  Lab 02/22/24 0502 02/23/24 0449 02/24/24 0511 02/25/24 0847 02/26/24 0714 02/27/24 0658 02/28/24 0900  NA 132* 135 136 136 135 136 135  K 5.4* 5.7* 4.9 5.1 4.9 5.3* 5.5*  CL 106 105 102 103 102 99 98  CO2 18* 20* 23 21* 21* 23 21*  GLUCOSE 99 108* 97 112* 134* 119* 124*  BUN 116* 133* 131* 129* 141* 152* 151*  CREATININE 2.84* 2.87* 2.53* 2.72* 2.79* 2.86* 3.20*  ALBUMIN 2.4* 2.8* 2.7* 2.6* 2.4* 2.4* 2.8*  CALCIUM 9.4 9.8 10.5* 10.6* 10.8* 11.2* 11.5*  PHOS 4.7* 3.0 3.4 3.4 3.6 4.4 5.2*   Liver Function Tests: Recent Labs  Lab 02/26/24 0714 02/27/24 0658 02/28/24 0900  ALBUMIN 2.4* 2.4* 2.8*   No results for input(s): LIPASE, AMYLASE in the last 168 hours. Recent Labs  Lab 02/25/24 0847  AMMONIA 13   CBC: No results for input(s): WBC, NEUTROABS, HGB, HCT, MCV, PLT in the last 168 hours. Cardiac Enzymes: No results for input(s): CKTOTAL, CKMB, CKMBINDEX, TROPONINI in the last 168 hours. CBG: Recent Labs  Lab 02/27/24 1219 02/28/24 0008 02/28/24 9482  02/28/24 0809 02/28/24 1159  GLUCAP 98 113* 96 151* 119*    Iron Studies: No results for input(s): IRON, TIBC, TRANSFERRIN, FERRITIN in the last 72 hours. Studies/Results: No results found.  (feeding supplement) PROSource Plus  30 mL Oral BID BM   sodium chloride    Intravenous Once   acetaminophen  (TYLENOL ) oral liquid 160 mg/5 mL  650 mg Per Tube Q6H   ascorbic acid   500 mg Per Tube TID   docusate  100 mg Per Tube BID   famotidine   10 mg Per Tube Daily   feeding supplement (NEPRO CARB STEADY)  1,000 mL Per Tube Q24H   feeding supplement (PROSource TF20)  60 mL Per Tube Daily   ferrous sulfate   300 mg Per Tube TID WC   folic acid   1 mg Per Tube Daily   heparin  injection (subcutaneous)  5,000 Units Subcutaneous Q8H    levothyroxine   25 mcg Per Tube Q0600   metoprolol  tartrate  12.5 mg Per Tube BID   mirtazapine   7.5 mg Per Tube QHS   multivitamin with minerals  1 tablet Per Tube Daily   mouth rinse  15 mL Mouth Rinse 4 times per day   polyethylene glycol  17 g Per Tube Daily   sodium zirconium cyclosilicate   10 g Oral Q6H   tacrolimus   1 mg Per Tube BID   tamsulosin   0.4 mg Oral QPC supper   thiamine   100 mg Per Tube Daily    BMET    Component Value Date/Time   NA 135 02/28/2024 0900   K 5.5 (H) 02/28/2024 0900   CL 98 02/28/2024 0900   CO2 21 (L) 02/28/2024 0900   GLUCOSE 124 (H) 02/28/2024 0900   BUN 151 (H) 02/28/2024 0900   CREATININE 3.20 (H) 02/28/2024 0900   CALCIUM 11.5 (H) 02/28/2024 0900   GFRNONAA 20 (L) 02/28/2024 0900   CBC    Component Value Date/Time   WBC 6.1 02/14/2024 1006   RBC 2.29 (L) 02/14/2024 1006   HGB 8.5 (L) 02/15/2024 0615   HCT 26.6 (L) 02/15/2024 0615   PLT 631 (H) 02/14/2024 1006   MCV 97.8 02/14/2024 1006   MCH 29.7 02/14/2024 1006   MCHC 30.4 02/14/2024 1006   RDW 17.2 (H) 02/14/2024 1006     Assessment/Plan:  AKI/CKD stage IIIb - BUN/Cr have markedly risen over the past week.  Poor po intake documented on I's/O's.  Will check renal US  to r/o obstruction and evaluate size and contour of kidneys.  Will check urine lytes and TAC level.  Rising calcium over the last 5 days, see below for plan.  Will start IVF's for now, NS at 100 mL/hr and follow UOP and Scr.  Will also need a UA which has not been obtained during admission and may direct further workup pending results.  No recent contrast studies since 02/01/24.  No uremic symptoms at this time but does have a slightly decreased appetite.  Will continue to follow closely.   Avoid nephrotoxic medications including NSAIDs and iodinated intravenous contrast exposure unless the latter is absolutely indicated.   Preferred narcotic agents for pain control are hydromorphone , fentanyl , and methadone. Morphine   should not be used.  Avoid Baclofen and avoid oral sodium phosphate  and magnesium citrate based laxatives / bowel preps.  Continue strict Input and Output monitoring. Will monitor the patient closely with you and intervene or adjust therapy as indicated by changes in clinical status/labs  Hyperkalemia - due to #1.  Agree with  Lokelma .  Also need to change tube feeds to Nepro due to AKI/CKD stage IIIb.  Hold prosource for now. AGMA - presumably due to #1.  Will start sodium bicarb tabs and follow. Hypercalcemia - will start IVF's as above and follow.  Will also check SPEP and iPTH.  Change of tube feeds as above. 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 Anemia - has not been checked for 2 weeks, will recheck CBC and iron studies H/o liver transplant - on prograf .  Will check tacrolimus  level.  Was on 1.5 mg bid up until 02/23/24 and now on 1 mg bid.   SDH/SAH/skull fracture - per trauma Bilateral rib fractures - per trauma Etoh abuse and withdrawal - CIWA, MVI, thiamine , folic acid .  Off of ativan  and dex.   Fairy RONAL Sellar, MD [employer] [phone]

## 2024-02-29 DIAGNOSIS — Z515 Encounter for palliative care: Secondary | ICD-10-CM | POA: Diagnosis not present

## 2024-02-29 DIAGNOSIS — Z7189 Other specified counseling: Secondary | ICD-10-CM | POA: Diagnosis not present

## 2024-02-29 LAB — COMPREHENSIVE METABOLIC PANEL WITH GFR
ALT: 22 U/L (ref 0–44)
AST: 18 U/L (ref 15–41)
Albumin: 2.3 g/dL — ABNORMAL LOW (ref 3.5–5.0)
Alkaline Phosphatase: 92 U/L (ref 38–126)
Anion gap: 16 — ABNORMAL HIGH (ref 5–15)
BUN: 139 mg/dL — ABNORMAL HIGH (ref 8–23)
CO2: 18 mmol/L — ABNORMAL LOW (ref 22–32)
Calcium: 10.2 mg/dL (ref 8.9–10.3)
Chloride: 103 mmol/L (ref 98–111)
Creatinine, Ser: 3.01 mg/dL — ABNORMAL HIGH (ref 0.61–1.24)
GFR, Estimated: 22 mL/min — ABNORMAL LOW (ref >60)
Glucose, Bld: 96 mg/dL (ref 70–99)
Potassium: 4.5 mmol/L (ref 3.5–5.1)
Sodium: 137 mmol/L (ref 135–145)
Total Bilirubin: 0.4 mg/dL (ref 0.0–1.2)
Total Protein: 6.3 g/dL — ABNORMAL LOW (ref 6.5–8.1)

## 2024-02-29 LAB — GLUCOSE, CAPILLARY
Glucose-Capillary: 110 mg/dL — ABNORMAL HIGH (ref 70–99)
Glucose-Capillary: 112 mg/dL — ABNORMAL HIGH (ref 70–99)
Glucose-Capillary: 118 mg/dL — ABNORMAL HIGH (ref 70–99)
Glucose-Capillary: 87 mg/dL (ref 70–99)
Glucose-Capillary: 94 mg/dL (ref 70–99)
Glucose-Capillary: 96 mg/dL (ref 70–99)

## 2024-02-29 LAB — CBC
HCT: 26.1 % — ABNORMAL LOW (ref 39.0–52.0)
Hemoglobin: 8 g/dL — ABNORMAL LOW (ref 13.0–17.0)
MCH: 28.5 pg (ref 26.0–34.0)
MCHC: 30.7 g/dL (ref 30.0–36.0)
MCV: 92.9 fL (ref 80.0–100.0)
Platelets: 349 10*3/uL (ref 150–400)
RBC: 2.81 MIL/uL — ABNORMAL LOW (ref 4.22–5.81)
RDW: 16 % — ABNORMAL HIGH (ref 11.5–15.5)
WBC: 8.1 10*3/uL (ref 4.0–10.5)
nRBC: 0 % (ref 0.0–0.2)

## 2024-02-29 LAB — MAGNESIUM: Magnesium: 2.5 mg/dL — ABNORMAL HIGH (ref 1.7–2.4)

## 2024-02-29 LAB — PARATHYROID HORMONE, INTACT (NO CA): PTH: 6 pg/mL — ABNORMAL LOW (ref 15–65)

## 2024-02-29 LAB — PHOSPHORUS: Phosphorus: 4.4 mg/dL (ref 2.5–4.6)

## 2024-02-29 MED ORDER — ZINC OXIDE 40 % EX OINT
TOPICAL_OINTMENT | Freq: Two times a day (BID) | CUTANEOUS | Status: DC
  Filled 2024-02-29 (×2): qty 57

## 2024-02-29 NOTE — Plan of Care (Signed)
  Problem: Clinical Measurements: Goal: Respiratory complications will improve Outcome: Progressing   Problem: Nutrition: Goal: Adequate nutrition will be maintained Outcome: Progressing   Problem: Coping: Goal: Level of anxiety will decrease Outcome: Progressing   Problem: Pain Managment: Goal: General experience of comfort will improve and/or be controlled Outcome: Progressing

## 2024-02-29 NOTE — Progress Notes (Signed)
   Palliative Medicine Inpatient Follow Up Note HPI: 73 yo male presenting to ED 5/28 after being found seizing after falling down stairs. CTH bil SDH and SAH with trace IVH and non-depressed occipital skull fx. Intubated 5/28, self-extubated 5/30.  Palliative care has been asked to get involved to address goals of care in the setting of chronic disease, adult failure to thrive, and muscular deconditioning.   Today's Discussion 02/29/2024  *Please note that this is a verbal dictation therefore any spelling or grammatical errors are due to the Dragon Medical One system interpretation.  Chart reviewed inclusive of vital signs, progress notes, laboratory results, and diagnostic images.   I met with [name] at bedside this morning. He is lying in bed in NAD. He denies pain, shortness of breath, or nausea this morning. He shares that since his PEG tube was placed he feels that he has been tolerating it well.  Patient is oscillating from a mental perspective in regards to where he is and why he is here.   I spoke to patients sister, [name] this morning. She shares that she is working on speaking to lawyer friends about how to navigate financial and healthcare related paperwork. We discussed patients overall state and what to anticipate morning into the future.   We discussed the plan for [name] to transition to skilled nursing in the oncoming days.   Questions and concerns addressed.  Objective Assessment: Vital Signs Vitals:   02/29/24 0502 02/29/24 0726  BP: 113/76 113/70  Pulse: 93 95  Resp: 16 16  Temp: 98.1 F (36.7 C) 97.7 F (36.5 C)  SpO2: 95% 94%    Intake/Output Summary (Last 24 hours) at 02/29/2024 0946 Last data filed at 02/29/2024 0500 Gross per 24 hour  Intake 11375.02 ml  Output 1400 ml  Net 9975.02 ml   Last Weight  Most recent update: 02/29/2024  5:11 AM    Weight  56.1 kg (123 lb 10.9 oz)            Gen:  [name] chronically ill in  appearance HEENT: dry mucous membranes CV: irregular rate and regular rhythm  PULM:  On RA, breathing is even and nonlabored ABD: (+) g-tube, soft/nontender  EXT: No edema  Neuro: Oriented to self   SUMMARY OF RECOMMENDATIONS   Full Code / Full scope of care at this time   S/P PEG placement  Patients oldest son, [name] and sister, [name] have been appointed by family to make decisions --> Patients family are speaking to lawyers to create official documents   Placement pending insurance authorization   The PMT will check in weekly _____________________________________________________________________________________[name] Palliative Medicine Team Team Cell Phone: [phone] Please utilize secure chat with additional questions, if there is no response within 30 minutes please call the above phone number  Time Spent: 35  Palliative Medicine Team providers are available by phone from 7am to 7pm daily and can be reached through the team cell phone.  Should this patient require assistance outside of these hours, please call the patient's attending physician.

## 2024-02-29 NOTE — Progress Notes (Signed)
 Patient ID: Nihar Klus, male   DOB: Feb 21, 1951, 73 y.o.   MRN: [id number] S: No new complaints. O:BP 113/70 (BP Location: Left Arm)   Pulse 95   Temp 97.7 F (36.5 C) (Oral)   Resp 16   Ht 5' 8 (1.727 m)   Wt 56.1 kg   SpO2 94%   BMI 18.81 kg/m   Intake/Output Summary (Last 24 hours) at 02/29/2024 0934 Last data filed at 02/29/2024 0500 Gross per 24 hour  Intake 11375.02 ml  Output 1400 ml  Net 9975.02 ml   Intake/Output: I/O last 3 completed shifts: In: 88447 [P.O.:533; I.V.:1037.8; NG/GT:9981.3] Out: 1700 [Urine:1700]  Intake/Output this shift:  No intake/output data recorded. Weight change: 0.6 kg Gen: NAD CVS: RRR Resp:CTA Abd: +BS, soft, NT/ND Ext: no edema  Recent Labs  Lab 02/23/24 0449 02/24/24 0511 02/25/24 0847 02/26/24 0714 02/27/24 0658 02/28/24 0900 02/29/24 0748  NA 135 136 136 135 136 135 137  K 5.7* 4.9 5.1 4.9 5.3* 5.5* 4.5  CL 105 102 103 102 99 98 103  CO2 20* 23 21* 21* 23 21* 18*  GLUCOSE 108* 97 112* 134* 119* 124* 96  BUN 133* 131* 129* 141* 152* 151* 139*  CREATININE 2.87* 2.53* 2.72* 2.79* 2.86* 3.20* 3.01*  ALBUMIN 2.8* 2.7* 2.6* 2.4* 2.4* 2.8* 2.3*  CALCIUM 9.8 10.5* 10.6* 10.8* 11.2* 11.5* 10.2  PHOS 3.0 3.4 3.4 3.6 4.4 5.2* 4.4  AST  --   --   --   --   --   --  18  ALT  --   --   --   --   --   --  22   Liver Function Tests: Recent Labs  Lab 02/27/24 0658 02/28/24 0900 02/29/24 0748  AST  --   --  18  ALT  --   --  22  ALKPHOS  --   --  92  BILITOT  --   --  0.4  PROT  --   --  6.3*  ALBUMIN 2.4* 2.8* 2.3*   No results for input(s): LIPASE, AMYLASE in the last 168 hours. Recent Labs  Lab 02/25/24 0847  AMMONIA 13   CBC: Recent Labs  Lab 02/29/24 0748  WBC 8.1  HGB 8.0*  HCT 26.1*  MCV 92.9  PLT 349   Cardiac Enzymes: No results for input(s): CKTOTAL, CKMB, CKMBINDEX, TROPONINI in the last 168 hours. CBG: Recent Labs  Lab 02/28/24 1529 02/28/24 1959 02/29/24 0035 02/29/24 0503  02/29/24 0746  GLUCAP 101* 121* 118* 110* 96    Iron Studies: No results for input(s): IRON, TIBC, TRANSFERRIN, FERRITIN in the last 72 hours. Studies/Results: US  RENAL Result Date: 02/29/2024 CLINICAL DATA:  Acute kidney injury EXAM: RENAL / URINARY TRACT ULTRASOUND COMPLETE COMPARISON:  None Available. FINDINGS: Right Kidney: Renal measurements: 10.6 x 4.4 x 3.7 cm = volume: 92 mL. Renal cortical echogenicity is diffusely increased in keeping with changes of underlying medical renal disease. Moderate cortical thinning. No intrarenal masses or calcifications. No hydronephrosis. No perinephric fluid collections are seen. Left Kidney: Renal measurements: 8.2 x 4.5 x 3.7 cm = volume: 72 mL. Renal cortical echogenicity is diffusely increased in keeping with changes of underlying medical renal disease. Moderate cortical thinning. No intrarenal masses or calcifications. No hydronephrosis. No perinephric fluid collections are seen. Bladder: Layering debris noted within the bladder lumen. The bladder is not distended. Other: None. IMPRESSION: 1. No evidence of obstructive uropathy. 2. Moderate bilateral renal cortical atrophy and increased cortical  echogenicity in keeping with changes of underlying medical renal disease. 3. Layering debris within the bladder lumen. Electronically Signed   By: Dorethia Molt M.D.   On: 02/29/2024 01:35    sodium chloride    Intravenous Once   acetaminophen  (TYLENOL ) oral liquid 160 mg/5 mL  650 mg Per Tube Q6H   ascorbic acid   500 mg Per Tube TID   docusate  100 mg Per Tube BID   famotidine   10 mg Per Tube Daily   feeding supplement (NEPRO CARB STEADY)  1,000 mL Per Tube Q24H   ferrous sulfate   300 mg Per Tube TID WC   folic acid   1 mg Per Tube Daily   heparin  injection (subcutaneous)  5,000 Units Subcutaneous Q8H   levothyroxine   25 mcg Per Tube Q0600   liver oil-zinc oxide   Topical BID   metoprolol  tartrate  12.5 mg Per Tube BID   mirtazapine   7.5 mg Per Tube  QHS   multivitamin with minerals  1 tablet Per Tube Daily   mouth rinse  15 mL Mouth Rinse 4 times per day   polyethylene glycol  17 g Per Tube Daily   sodium bicarbonate   650 mg Oral BID   tacrolimus   1 mg Per Tube BID   tamsulosin   0.4 mg Oral QPC supper   thiamine   100 mg Per Tube Daily    BMET    Component Value Date/Time   NA 137 02/29/2024 0748   K 4.5 02/29/2024 0748   CL 103 02/29/2024 0748   CO2 18 (L) 02/29/2024 0748   GLUCOSE 96 02/29/2024 0748   BUN 139 (H) 02/29/2024 0748   CREATININE 3.01 (H) 02/29/2024 0748   CALCIUM 10.2 02/29/2024 0748   GFRNONAA 22 (L) 02/29/2024 0748   CBC    Component Value Date/Time   WBC 8.1 02/29/2024 0748   RBC 2.81 (L) 02/29/2024 0748   HGB 8.0 (L) 02/29/2024 0748   HCT 26.1 (L) 02/29/2024 0748   PLT 349 02/29/2024 0748   MCV 92.9 02/29/2024 0748   MCH 28.5 02/29/2024 0748   MCHC 30.7 02/29/2024 0748   RDW 16.0 (H) 02/29/2024 0748       Assessment/Plan:   AKI/CKD stage IIIb - BUN/Cr have markedly risen over the past week.  Poor po intake documented on I's/O's.  Will check renal US  to r/o obstruction and evaluate size and contour of kidneys.  Will check urine lytes and TAC level.  Rising calcium over the last 5 days, see below for plan.  Started IVF's NS at 100 mL/hr with improvement of BUN/Cr.  Continue with IVF's for now.  Improved UOP and Scr.  UA with large leukocytes and mild proteinuria.  Renal US  without obstruction but with increased echogenicity.  Tacrolimus  level is pending.  No recent contrast studies since 02/01/24.  No uremic symptoms at this time but does have a slightly decreased appetite.  Will continue to follow closely.   Avoid nephrotoxic medications including NSAIDs and iodinated intravenous contrast exposure unless the latter is absolutely indicated.   Preferred narcotic agents for pain control are hydromorphone , fentanyl , and methadone. Morphine  should not be used.  Avoid Baclofen and avoid oral sodium phosphate   and magnesium citrate based laxatives / bowel preps.  Continue strict Input and Output monitoring. Will monitor the patient closely with you and intervene or adjust therapy as indicated by changes in clinical status/labs  Hyperkalemia - due to #1.  Agree with Lokelma .  Also need to change tube feeds  to Nepro due to AKI/CKD stage IIIb.  Hold prosource for now.  Improved this morning. AGMA - presumably due to #1.  Will start sodium bicarb tabs and follow. Hypercalcemia - will start IVF's as above and follow.  Will also check SPEP and iPTH.  Change of tube feeds as above. 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 Anemia - has not been checked for 2 weeks, will recheck CBC and iron studies H/o liver transplant - on prograf .  Will check tacrolimus  level.  Was on 1.5 mg bid up until 02/23/24 and now on 1 mg bid.   SDH/SAH/skull fracture - per trauma Bilateral rib fractures - per trauma Etoh abuse and withdrawal - CIWA, MVI, thiamine , folic acid .  Off of ativan  and dex.    Fairy RONAL Sellar, MD [facility]

## 2024-02-29 NOTE — Plan of Care (Signed)
  Problem: Activity: Goal: Risk for activity intolerance will decrease Outcome: Progressing   Problem: Nutrition: Goal: Adequate nutrition will be maintained Outcome: Progressing   Problem: Coping: Goal: Level of anxiety will decrease Outcome: Progressing   Problem: Pain Managment: Goal: General experience of comfort will improve and/or be controlled Outcome: Progressing   Problem: Safety: Goal: Ability to remain free from injury will improve Outcome: Progressing

## 2024-02-29 NOTE — TOC Progression Note (Signed)
 Transition of Care [facility]) - Progression Note    Patient Details  Name: [name] MRN: [id number] Date of Birth: 04/15/1951  Transition of Care [facility]) CM/SW Contact  Luise JAYSON Pan, [place] Phone Number: 02/29/2024, 1:07 PM  Clinical Narrative:   Per Jackline Commons, shara is pending and facility is waiting on one time contract to be approved.   TOC will continue to follow.    Expected Discharge Plan: Skilled Nursing Facility Barriers to Discharge: Continued Medical Work up  Expected Discharge Plan and Services In-house Referral: Clinical Social Work     Living arrangements for the past 2 months: Single Family Home                                       Social Determinants of Health (SDOH) Interventions SDOH Screenings   Food Insecurity: No Food Insecurity (02/10/2024)  Housing: Low Risk  (02/10/2024)  Transportation Needs: No Transportation Needs (02/10/2024)  Utilities: Not At Risk (02/10/2024)  Social Connections: Moderately Integrated (02/10/2024)    Readmission Risk Interventions     No data to display

## 2024-02-29 NOTE — Progress Notes (Addendum)
 Progress Note  7 Days Post-Op  Subjective: Pt denies complaints this AM. Denies pain. Denies urinary symptoms or flank pain. He is voiding with good uop (0.7 ml/kg/hr over the last 12 hours). Tolerating tf's and po but not eating much. BM overnight.   Afebrile. No tachycardia or hypotension. Labs pending.   Objective: Vital signs in last 24 hours: Temp:  [97.7 F (36.5 C)-98.3 F (36.8 C)] 97.7 F (36.5 C) (06/25 0726) Pulse Rate:  [81-95] 95 (06/25 0726) Resp:  [12-18] 16 (06/25 0726) BP: (105-113)/(69-76) 113/70 (06/25 0726) SpO2:  [91 %-97 %] 94 % (06/25 0726) Weight:  [56.1 kg] 56.1 kg (06/25 0500) Last BM Date : 02/29/24  Intake/Output from previous day: 06/24 0701 - 06/25 0700 In: 88447 [P.O.:533; I.V.:1037.8; NG/GT:9981.3] Out: 1400 [Urine:1400] Intake/Output this shift: No intake/output data recorded.  PE: Gen:  Alert, NAD, oriented x4 this AM Card: Reg rate this morning Pulm: Rate and effort normal Abd: Soft, ND, NT. PEG tube in place, currently clamped, 2cm at the skin, periwound with some maceration but no cellulitis, induration, fluctuance or drainage.  Ext:  No LE edema Neuro:  MAE's, f/c, non-focal  Lab Results:  No results for input(s): WBC, HGB, HCT, PLT in the last 72 hours. BMET Recent Labs    02/27/24 0658 02/28/24 0900  NA 136 135  K 5.3* 5.5*  CL 99 98  CO2 23 21*  GLUCOSE 119* 124*  BUN 152* 151*  CREATININE 2.86* 3.20*  CALCIUM 11.2* 11.5*   PT/INR No results for input(s): LABPROT, INR in the last 72 hours. CMP     Component Value Date/Time   NA 135 02/28/2024 0900   K 5.5 (H) 02/28/2024 0900   CL 98 02/28/2024 0900   CO2 21 (L) 02/28/2024 0900   GLUCOSE 124 (H) 02/28/2024 0900   BUN 151 (H) 02/28/2024 0900   CREATININE 3.20 (H) 02/28/2024 0900   CALCIUM 11.5 (H) 02/28/2024 0900   PROT 5.8 (L) 02/13/2024 0531   ALBUMIN 2.8 (L) 02/28/2024 0900   AST 24 02/13/2024 0531   ALT 24 02/13/2024 0531   ALKPHOS 133 (H)  02/13/2024 0531   BILITOT 0.3 02/13/2024 0531   GFRNONAA 20 (L) 02/28/2024 0900   Lipase  No results found for: LIPASE     Studies/Results: US  RENAL Result Date: 02/29/2024 CLINICAL DATA:  Acute kidney injury EXAM: RENAL / URINARY TRACT ULTRASOUND COMPLETE COMPARISON:  None Available. FINDINGS: Right Kidney: Renal measurements: 10.6 x 4.4 x 3.7 cm = volume: 92 mL. Renal cortical echogenicity is diffusely increased in keeping with changes of underlying medical renal disease. Moderate cortical thinning. No intrarenal masses or calcifications. No hydronephrosis. No perinephric fluid collections are seen. Left Kidney: Renal measurements: 8.2 x 4.5 x 3.7 cm = volume: 72 mL. Renal cortical echogenicity is diffusely increased in keeping with changes of underlying medical renal disease. Moderate cortical thinning. No intrarenal masses or calcifications. No hydronephrosis. No perinephric fluid collections are seen. Bladder: Layering debris noted within the bladder lumen. The bladder is not distended. Other: None. IMPRESSION: 1. No evidence of obstructive uropathy. 2. Moderate bilateral renal cortical atrophy and increased cortical echogenicity in keeping with changes of underlying medical renal disease. 3. Layering debris within the bladder lumen. Electronically Signed   By: Dorethia Molt M.D.   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 Joshua  Loaded with Keppra  in ED, completed 1 week BID keppra , F/U CT head slightly increased SDH but exam stable. TBI therapies.  Bilateral rib fractures that were sustained in a fall on 01/21/2024, similar on follow-up CT scan - multimodal pain control, pulm toilet,  Acute hypoxic respiratory failure - self-extubated, weaned back to room  air, continue flutter, chest PT, duonebs EtoH abuse and withdrawal - CIWA. MVI, thiamine , folic acid . No longer requiring dex or ativan . Tachycardia - Cards has seen and signed off. EKG. Cont metoprolol .  CKD/AKI - appreciate Nephrology F/U. Cont low potassium diet and nepro for TF's. Hyperkalemia 5.5 6/24, given lokelma  and nephrology consulted. Labs pending this am. They are also obtaining renal US .  Hx of liver transplant (2004 and 2012) - cont prograf . Levels pending this am FEN: S/p PEG 6/18. Nocturnal TF's. Passed for Reg diet with thin liquids with SLP. Renal diet. Ecouraged PO. Desitin around PEG tube VTE: PAS, SQH Endo: low dose LT4 ID: None. UA with large leukocytes, many bacteria, and > 50 WBC (0-5 squamous), denies urinary symptoms, wbc pending, afebrile, send ucx Foley: none Dispo: 4NP, therapies. Eval by psych and deemed not to have capacity for decision-making regarding PEG placement and SNF placement. Palliative also engaged. No HCPOA, legal spouse identified. Per palliative note, Patients oldest son, [name] and sister, [name] have been appointed by family to make decisions. Plan for SNF. Insurance auth pending. Renal following. Appreciate recs.     LOS: 28 days   I reviewed nursing notes, last 24 h vitals and pain scores, last 48 h intake and output, last 24 h labs and trends, and last 24 h imaging results.  This care required moderate level of medical decision making.    [name], [facility] 02/29/2024, 7:49 AM Please see Amion for pager number during day hours 7:00am-4:30pm

## 2024-03-01 LAB — MULTIPLE MYELOMA PANEL, SERUM
Albumin SerPl Elph-Mcnc: 2.7 g/dL — ABNORMAL LOW (ref 2.9–4.4)
Albumin/Glob SerPl: 0.8 (ref 0.7–1.7)
Alpha 1: 0.4 g/dL (ref 0.0–0.4)
Alpha2 Glob SerPl Elph-Mcnc: 1.1 g/dL — ABNORMAL HIGH (ref 0.4–1.0)
B-Globulin SerPl Elph-Mcnc: 1 g/dL (ref 0.7–1.3)
Gamma Glob SerPl Elph-Mcnc: 1.4 g/dL (ref 0.4–1.8)
Globulin, Total: 3.8 g/dL (ref 2.2–3.9)
IgA: 233 mg/dL (ref 61–437)
IgG (Immunoglobin G), Serum: 1386 mg/dL (ref 603–1613)
IgM (Immunoglobulin M), Srm: 86 mg/dL (ref 20–172)
Total Protein ELP: 6.5 g/dL (ref 6.0–8.5)

## 2024-03-01 LAB — COMPREHENSIVE METABOLIC PANEL WITH GFR
ALT: 22 U/L (ref 0–44)
AST: 15 U/L (ref 15–41)
Albumin: 2.5 g/dL — ABNORMAL LOW (ref 3.5–5.0)
Alkaline Phosphatase: 110 U/L (ref 38–126)
Anion gap: 10 (ref 5–15)
BUN: 119 mg/dL — ABNORMAL HIGH (ref 8–23)
CO2: 23 mmol/L (ref 22–32)
Calcium: 10.9 mg/dL — ABNORMAL HIGH (ref 8.9–10.3)
Chloride: 105 mmol/L (ref 98–111)
Creatinine, Ser: 2.84 mg/dL — ABNORMAL HIGH (ref 0.61–1.24)
GFR, Estimated: 23 mL/min — ABNORMAL LOW (ref >60)
Glucose, Bld: 113 mg/dL — ABNORMAL HIGH (ref 70–99)
Potassium: 4.7 mmol/L (ref 3.5–5.1)
Sodium: 138 mmol/L (ref 135–145)
Total Bilirubin: 0.7 mg/dL (ref 0.0–1.2)
Total Protein: 4.6 g/dL — ABNORMAL LOW (ref 6.5–8.1)

## 2024-03-01 LAB — GLUCOSE, CAPILLARY
Glucose-Capillary: 104 mg/dL — ABNORMAL HIGH (ref 70–99)
Glucose-Capillary: 110 mg/dL — ABNORMAL HIGH (ref 70–99)
Glucose-Capillary: 132 mg/dL — ABNORMAL HIGH (ref 70–99)
Glucose-Capillary: 93 mg/dL (ref 70–99)
Glucose-Capillary: 95 mg/dL (ref 70–99)
Glucose-Capillary: 97 mg/dL (ref 70–99)

## 2024-03-01 MED ORDER — CEPHALEXIN 500 MG PO CAPS
500.0000 mg | ORAL_CAPSULE | Freq: Two times a day (BID) | ORAL | Status: AC
  Administered 2024-03-01 – 2024-03-05 (×10): 500 mg via ORAL
  Filled 2024-03-01 (×10): qty 1

## 2024-03-01 MED ORDER — NEPRO/CARBSTEADY PO LIQD
1000.0000 mL | ORAL | Status: DC
  Administered 2024-03-01 – 2024-03-06 (×6): 1000 mL
  Filled 2024-03-01 (×4): qty 1000

## 2024-03-01 NOTE — Progress Notes (Signed)
 Physical Therapy Treatment Patient Details Name: [name] MRN: [id number] DOB: Jan 17, 1951 Today's Date: 03/01/2024   History of Present Illness 73 yo male presenting to ED 5/28 after being found seizing after falling down stairs. CTH bil SDH and SAH with trace IVH and non-depressed occipital skull fx. Intubated 5/28, self-extubated 5/30. Recently sustained bilateral rib fxs after a fall 5/17. Imaging also showed stable subacute fractures involving the right sacral ala, right superior and inferior rami, and left parasymphyseal region along with stable superior endplate compression deformities at T3, T4, and T5 since 01/21/2024. S/p EGD and PEG tube placement 6/18. PMH includes ETOH abuse s/p two liver transplants, 5/17 fall with R sacral ala superior and inferior rami fx, L superior endplate compression deformities at T3 T4 T5    PT Comments  Pt greeted supine in bed, pleasant and agreeable to PT session. He is making steady progress towards his acute PT goals demonstrated by advanced functional mobility with decreased physical assistance. He performed bed mobility with supervision. Pt completed 5 sit<>stands without AD with supervision. He increased gait distance ambulating ~150ft using RW with CGA. Pt demonstrated multiple gait abnormalities and poor safety awareness. Patient will benefit from continued inpatient follow up therapy, <3 hours/day.     If plan is discharge home, recommend the following: Assistance with cooking/housework;Assist for transportation;Help with stairs or ramp for entrance;Supervision due to cognitive status;A little help with walking and/or transfers;A little help with bathing/dressing/bathroom;Direct supervision/assist for medications management;Direct supervision/assist for financial management   Can travel by private vehicle     Yes  Equipment Recommendations  Rolling walker (2 wheels);BSC/3in1    Recommendations for Other Services       Precautions /  Restrictions Precautions Precautions: Fall Recall of Precautions/Restrictions: Impaired Restrictions Weight Bearing Restrictions Per Provider Order: No     Mobility  Bed Mobility Overal bed mobility: Needs Assistance Bed Mobility: Supine to Sit, Sit to Supine     Supine to sit: Supervision, HOB elevated Sit to supine: Supervision, HOB elevated   General bed mobility comments: Pt sat up on L side of bed with increased time. He brought BLE off EOB, elevated trunk, and scooted fwd til feet support without physical assist or cueing. Returned to be and repositioned himself in the center.    Transfers Overall transfer level: Needs assistance Equipment used: None, Rolling walker (2 wheels) Transfers: Sit to/from Stand Sit to Stand: Contact guard assist, Supervision           General transfer comment: Pt demonstrated proper hand placement using RW. CGA to power up from lowest bed height. Good eccentric control. Pt performed 5 reps without AD or physical assist to power up.    Ambulation/Gait Ambulation/Gait assistance: Contact guard assist Gait Distance (Feet): 150 Feet Assistive device: Rolling walker (2 wheels) Gait Pattern/deviations: Decreased stride length, Step-through pattern, Trunk flexed, Narrow base of support, Decreased dorsiflexion - right, Decreased dorsiflexion - left Gait velocity: decr Gait velocity interpretation: <1.31 ft/sec, indicative of household ambulator   General Gait Details: Pt ambulated with short slow steps, NBOS, limited foot clearence, kyphotic posture, and fixed gaze on the floor. VC/TC for correct with minimal adjustments made. Pt had difficulty sequencing turns. Cues for proximity to RW and light assist for balance.   Stairs             Wheelchair Mobility     Tilt Bed    Modified Rankin (Stroke Patients Only)       Balance Overall balance assessment: Needs assistance  Sitting-balance support: No upper extremity supported, Feet  supported Sitting balance-Leahy Scale: Good Sitting balance - Comments: Pt sat EOB with supervision. Able to reach outside his BOS to items on tray table.   Standing balance support: Bilateral upper extremity supported, During functional activity, Reliant on assistive device for balance Standing balance-Leahy Scale: Poor Standing balance comment: Pt dependent on RW                            Communication Communication Communication: No apparent difficulties  Cognition Arousal: Alert Behavior During Therapy: Flat affect   PT - Cognitive impairments: No family/caregiver present to determine baseline, Awareness, Memory, Attention, Problem solving, Safety/Judgement                   Rancho Levels of Cognitive Functioning Rancho Los Amigos Scales of Cognitive Functioning: Automatic, Appropriate: Minimal Assistance for Daily Living Skills Rancho Los Amigos Scales of Cognitive Functioning: Automatic, Appropriate: Minimal Assistance for Daily Living Skills [VII] PT - Cognition Comments: Pt continues to report he was supposed to d/c home although per chart review plan appears to be post-acute rehab. Poor safety awareness requires cues throughout mobility. Following commands: Impaired Following commands impaired: Only follows one step commands consistently, Follows one step commands with increased time    Cueing Cueing Techniques: Verbal cues, Tactile cues, Visual cues  Exercises      General Comments        Pertinent Vitals/Pain Pain Assessment Pain Assessment: No/denies pain    Home Living                          Prior Function            PT Goals (current goals can now be found in the care plan section) Acute Rehab PT Goals Patient Stated Goal: Return Home Progress towards PT goals: Progressing toward goals    Frequency    Min 3X/week      PT Plan      Co-evaluation              AM-PAC PT 6 Clicks Mobility   Outcome  Measure  Help needed turning from your back to your side while in a flat bed without using bedrails?: A Little Help needed moving from lying on your back to sitting on the side of a flat bed without using bedrails?: A Little Help needed moving to and from a bed to a chair (including a wheelchair)?: A Little Help needed standing up from a chair using your arms (e.g., wheelchair or bedside chair)?: A Little Help needed to walk in hospital room?: A Little Help needed climbing 3-5 steps with a railing? : A Lot 6 Click Score: 17    End of Session Equipment Utilized During Treatment: Gait belt Activity Tolerance: Patient tolerated treatment well Patient left: in bed;with call bell/phone within reach;with bed alarm set Nurse Communication: Mobility status PT Visit Diagnosis: Unsteadiness on feet (R26.81);Muscle weakness (generalized) (M62.81);Other symptoms and signs involving the nervous system (R29.898);Other abnormalities of gait and mobility (R26.89);Difficulty in walking, not elsewhere classified (R26.2)     Time: 8395-8375 PT Time Calculation (min) (ACUTE ONLY): 20 min  Charges:    $Gait Training: 8-22 mins PT General Charges $$ ACUTE PT VISIT: 1 Visit                     Randall SAUNDERS, PT, DPT Acute Rehabilitation Services  Office: [phone] Secure Chat Preferred  Delon CHRISTELLA Callander 03/01/2024, 5:05 PM

## 2024-03-01 NOTE — Progress Notes (Signed)
 Speech Language Pathology Treatment: Cognitive-Linguistic  Patient Details Name: [name] MRN: [id number] DOB: June 20, 1951 Today's Date: 03/01/2024 Time: 8888-8868 SLP Time Calculation (min) (ACUTE ONLY): 20 min  Assessment / Plan / Recommendation Clinical Impression  Pt demonstrated decreased awareness to deficits states he feels like his cognition is baseline. When asked what the plan is for discharge he states he is hoping to be discharged and go to [facility] because they are good with transplants (stating he had a liver transplant). He also stated he had only been in the hospital for a few days and when told he had been here since 5/28 he again stated that there's no way I've been here that long. I had to have gone home and maybe come back. Eventually he appeared to understand. He answered questions from mock menu with 88% accuracy. Pt was asked to state/sequence the steps in changing a flat tire with 90% accuracy needing 2 requests for more specific information. Continues to exhibit behaviors consistent with [place]. Continue ST.    HPI HPI: [name] is a 73 yo male presenting to ED 5/28 after being found seizing after falling down stairs. CTH shows bilateral SDH and SAH with trace IVH and non-depressed occipital skull fx. Intubated 5/28, self-extubated 5/30. Recently sustained bilateral rib fxs after a fall 5/17. MBS 5/30 showed moderate pharyngeal dysphagia which was thought to be partially chronic in nature. There was silent aspiration as pharyngeal residue progressed past the vocal folds. 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

## 2024-03-01 NOTE — Progress Notes (Signed)
 Patient ID: [name], male   DOB: July 22, 1951, 73 y.o.   MRN: [id number] S: No new complaints O:BP 117/71 (BP Location: Right Arm)   Pulse 84   Temp (!) 97.5 F (36.4 C)   Resp 17   Ht 5' 8 (1.727 m)   Wt 56 kg   SpO2 96%   BMI 18.77 kg/m   Intake/Output Summary (Last 24 hours) at 03/01/2024 1125 Last data filed at 03/01/2024 0844 Gross per 24 hour  Intake --  Output 600 ml  Net -600 ml   Intake/Output: I/O last 3 completed shifts: In: 88860 [P.O.:120; I.V.:1037.8; NG/GT:9981.3] Out: 900 [Urine:900]  Intake/Output this shift:  Total I/O In: -  Out: 200 [Urine:200] Weight change: -0.1 kg Gen: NAD CVS: RRR Resp:CTA Abd: +BS, soft, NT/ND Ext: no edema  Recent Labs  Lab 02/24/24 0511 02/25/24 0847 02/26/24 0714 02/27/24 0658 02/28/24 0900 02/29/24 0748 03/01/24 0620  NA 136 136 135 136 135 137 138  K 4.9 5.1 4.9 5.3* 5.5* 4.5 4.7  CL 102 103 102 99 98 103 105  CO2 23 21* 21* 23 21* 18* 23  GLUCOSE 97 112* 134* 119* 124* 96 113*  BUN 131* 129* 141* 152* 151* 139* 119*  CREATININE 2.53* 2.72* 2.79* 2.86* 3.20* 3.01* 2.84*  ALBUMIN 2.7* 2.6* 2.4* 2.4* 2.8* 2.3* 2.5*  CALCIUM 10.5* 10.6* 10.8* 11.2* 11.5* 10.2 10.9*  PHOS 3.4 3.4 3.6 4.4 5.2* 4.4  --   AST  --   --   --   --   --  18 15  ALT  --   --   --   --   --  22 22   Liver Function Tests: Recent Labs  Lab 02/28/24 0900 02/29/24 0748 03/01/24 0620  AST  --  18 15  ALT  --  22 22  ALKPHOS  --  92 110  BILITOT  --  0.4 0.7  PROT  --  6.3* 4.6*  ALBUMIN 2.8* 2.3* 2.5*   No results for input(s): LIPASE, AMYLASE in the last 168 hours. Recent Labs  Lab 02/25/24 0847  AMMONIA 13   CBC: Recent Labs  Lab 02/29/24 0748  WBC 8.1  HGB 8.0*  HCT 26.1*  MCV 92.9  PLT 349   Cardiac Enzymes: No results for input(s): CKTOTAL, CKMB, CKMBINDEX, TROPONINI in the last 168 hours. CBG: Recent Labs  Lab 02/29/24 1700 02/29/24 2000 03/01/24 0024 03/01/24 0432 03/01/24 0856  GLUCAP  94 112* 95 110* 97    Iron Studies: No results for input(s): IRON, TIBC, TRANSFERRIN, FERRITIN in the last 72 hours. Studies/Results: US  RENAL Result Date: 02/29/2024 CLINICAL DATA:  Acute kidney injury EXAM: RENAL / URINARY TRACT ULTRASOUND COMPLETE COMPARISON:  None Available. FINDINGS: Right Kidney: Renal measurements: 10.6 x 4.4 x 3.7 cm = volume: 92 mL. Renal cortical echogenicity is diffusely increased in keeping with changes of underlying medical renal disease. Moderate cortical thinning. No intrarenal masses or calcifications. No hydronephrosis. No perinephric fluid collections are seen. Left Kidney: Renal measurements: 8.2 x 4.5 x 3.7 cm = volume: 72 mL. Renal cortical echogenicity is diffusely increased in keeping with changes of underlying medical renal disease. Moderate cortical thinning. No intrarenal masses or calcifications. No hydronephrosis. No perinephric fluid collections are seen. Bladder: Layering debris noted within the bladder lumen. The bladder is not distended. Other: None. IMPRESSION: 1. No evidence of obstructive uropathy. 2. Moderate bilateral renal cortical atrophy and increased cortical echogenicity in keeping with changes of underlying medical renal  disease. 3. Layering debris within the bladder lumen. Electronically Signed   By: Dorethia Molt M.D.   On: 02/29/2024 01:35    sodium chloride    Intravenous Once   acetaminophen  (TYLENOL ) oral liquid 160 mg/5 mL  650 mg Per Tube Q6H   ascorbic acid   500 mg Per Tube TID   cephALEXin  500 mg Oral Q12H   docusate  100 mg Per Tube BID   famotidine   10 mg Per Tube Daily   feeding supplement (NEPRO CARB STEADY)  1,000 mL Per Tube Q24H   ferrous sulfate   300 mg Per Tube TID WC   folic acid   1 mg Per Tube Daily   heparin  injection (subcutaneous)  5,000 Units Subcutaneous Q8H   levothyroxine   25 mcg Per Tube Q0600   liver oil-zinc oxide   Topical BID   metoprolol  tartrate  12.5 mg Per Tube BID   mirtazapine   7.5 mg Per  Tube QHS   multivitamin with minerals  1 tablet Per Tube Daily   mouth rinse  15 mL Mouth Rinse 4 times per day   polyethylene glycol  17 g Per Tube Daily   sodium bicarbonate   650 mg Oral BID   tacrolimus   1 mg Per Tube BID   tamsulosin   0.4 mg Oral QPC supper   thiamine   100 mg Per Tube Daily    BMET    Component Value Date/Time   NA 138 03/01/2024 0620   K 4.7 03/01/2024 0620   CL 105 03/01/2024 0620   CO2 23 03/01/2024 0620   GLUCOSE 113 (H) 03/01/2024 0620   BUN 119 (H) 03/01/2024 0620   CREATININE 2.84 (H) 03/01/2024 0620   CALCIUM 10.9 (H) 03/01/2024 0620   GFRNONAA 23 (L) 03/01/2024 0620   CBC    Component Value Date/Time   WBC 8.1 02/29/2024 0748   RBC 2.81 (L) 02/29/2024 0748   HGB 8.0 (L) 02/29/2024 0748   HCT 26.1 (L) 02/29/2024 0748   PLT 349 02/29/2024 0748   MCV 92.9 02/29/2024 0748   MCH 28.5 02/29/2024 0748   MCHC 30.7 02/29/2024 0748   RDW 16.0 (H) 02/29/2024 0748   Assessment/Plan:   AKI/CKD stage IIIb - BUN/Cr have markedly risen over the past week.  Poor po intake documented on I's/O's.  Will check renal US  to r/o obstruction and evaluate size and contour of kidneys.  Will check urine lytes and TAC level.  Rising calcium over the last 5 days, see below for plan.  Started IVF's NS at 100 mL/hr with improvement of BUN/Cr.  Continue with IVF's for now.  Improved UOP and Scr.  UA with large leukocytes and mild proteinuria.  Renal US  without obstruction but with increased echogenicity.  Tacrolimus  level is pending.  No recent contrast studies since 02/01/24.  No uremic symptoms at this time but does have a slightly decreased appetite.  Will continue to follow closely.   Avoid nephrotoxic medications including NSAIDs and iodinated intravenous contrast exposure unless the latter is absolutely indicated.   Preferred narcotic agents for pain control are hydromorphone , fentanyl , and methadone. Morphine  should not be used.  Avoid Baclofen and avoid oral sodium  phosphate and magnesium citrate based laxatives / bowel preps.  Continue strict Input and Output monitoring. Will monitor the patient closely with you and intervene or adjust therapy as indicated by changes in clinical status/labs  Hyperkalemia - due to #1.  Agree with Lokelma .  Also need to change tube feeds to Nepro due to  AKI/CKD stage IIIb.  Hold prosource for now.  Improved this morning. AGMA - presumably due to #1.  Will start sodium bicarb tabs and follow. Hypercalcemia - will start IVF's as above and follow.  Will also check SPEP and iPTH.  Change of tube feeds as above. 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 Anemia - has not been checked for 2 weeks, will recheck CBC and iron studies H/o liver transplant - on prograf .  Will check tacrolimus  level.  Was on 1.5 mg bid up until 02/23/24 and now on 1 mg bid.   SDH/SAH/skull fracture - per trauma Bilateral rib fractures - per trauma Etoh abuse and withdrawal - CIWA, MVI, thiamine , folic acid .  Off of ativan  and dex.  Fairy RONAL Sellar, MD [employer] [phone]

## 2024-03-01 NOTE — Progress Notes (Addendum)
 Progress Note  8 Days Post-Op  Subjective: Pt denies complaints this AM. Denies pain. He is voiding with good uop (0.6 ml/kg/hr over the last 12 hours). Tolerating tf's and po but not eating much. BM overnight.   Afebrile. No tachycardia or hypotension. Cr 2.84. K 4.7. Anion gap 10 after starting sodium bicarb tabs   Objective: Vital signs in last 24 hours: Temp:  [97.5 F (36.4 C)-98.3 F (36.8 C)] 97.5 F (36.4 C) (06/26 0858) Pulse Rate:  [80-84] 84 (06/26 0858) Resp:  [16-18] 17 (06/26 0858) BP: (110-117)/(63-71) 117/71 (06/26 0858) SpO2:  [92 %-98 %] 96 % (06/26 0858) Weight:  [56 kg] 56 kg (06/26 0500) Last BM Date : 02/29/24  Intake/Output from previous day: 06/25 0701 - 06/26 0700 In: -  Out: 400 [Urine:400] Intake/Output this shift: Total I/O In: -  Out: 200 [Urine:200]  PE: Gen:  Alert, NAD, oriented x4 this AM Card: Reg rate this morning Pulm: Rate and effort normal Abd: Soft, ND, NT. PEG tube in place, currently clamped, 2cm at the skin, periwound with some maceration but no cellulitis, induration, fluctuance or drainage.  Ext:  No LE edema Neuro:  MAE's, f/c, non-focal  Lab Results:  Recent Labs    02/29/24 0748  WBC 8.1  HGB 8.0*  HCT 26.1*  PLT 349   BMET Recent Labs    02/29/24 0748 03/01/24 0620  NA 137 138  K 4.5 4.7  CL 103 105  CO2 18* 23  GLUCOSE 96 113*  BUN 139* 119*  CREATININE 3.01* 2.84*  CALCIUM 10.2 10.9*   PT/INR No results for input(s): LABPROT, INR in the last 72 hours. CMP     Component Value Date/Time   NA 138 03/01/2024 0620   K 4.7 03/01/2024 0620   CL 105 03/01/2024 0620   CO2 23 03/01/2024 0620   GLUCOSE 113 (H) 03/01/2024 0620   BUN 119 (H) 03/01/2024 0620   CREATININE 2.84 (H) 03/01/2024 0620   CALCIUM 10.9 (H) 03/01/2024 0620   PROT 4.6 (L) 03/01/2024 0620   ALBUMIN 2.5 (L) 03/01/2024 0620   AST 15 03/01/2024 0620   ALT 22 03/01/2024 0620   ALKPHOS 110 03/01/2024 0620   BILITOT 0.7  03/01/2024 0620   GFRNONAA 23 (L) 03/01/2024 0620   Lipase  No results found for: LIPASE     Studies/Results: US  RENAL Result Date: 02/29/2024 CLINICAL DATA:  Acute kidney injury EXAM: RENAL / URINARY TRACT ULTRASOUND COMPLETE COMPARISON:  None Available. FINDINGS: Right Kidney: Renal measurements: 10.6 x 4.4 x 3.7 cm = volume: 92 mL. Renal cortical echogenicity is diffusely increased in keeping with changes of underlying medical renal disease. Moderate cortical thinning. No intrarenal masses or calcifications. No hydronephrosis. No perinephric fluid collections are seen. Left Kidney: Renal measurements: 8.2 x 4.5 x 3.7 cm = volume: 72 mL. Renal cortical echogenicity is diffusely increased in keeping with changes of underlying medical renal disease. Moderate cortical thinning. No intrarenal masses or calcifications. No hydronephrosis. No perinephric fluid collections are seen. Bladder: Layering debris noted within the bladder lumen. The bladder is not distended. Other: None. IMPRESSION: 1. No evidence of obstructive uropathy. 2. Moderate bilateral renal cortical atrophy and increased cortical echogenicity in keeping with changes of underlying medical renal disease. 3. Layering debris within the bladder lumen. Electronically Signed   By: Dorethia Molt M.D.   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 TBI therapies.  Bilateral rib fractures that were sustained in a fall on 01/21/2024, similar on follow-up CT scan - multimodal pain control, pulm toilet,  Acute hypoxic respiratory failure - self-extubated, weaned back to room air, continue flutter, chest PT,  duonebs EtoH abuse and withdrawal - CIWA. MVI, thiamine , folic acid . No longer requiring dex or ativan . Tachycardia - Cards has seen and signed off. EKG. Cont metoprolol .  CKD/AKI - appreciate Nephrology F/U. Cont low potassium diet and nepro for TF's. Hold Prosource. Hyperkalemia 5.5 6/24, given lokelma  and nephrology consulted. They have obtained labs and renal US . Cr improving and K 4.7 on last check. Hx of liver transplant (2004 and 2012) - cont prograf . Levels pending this am FEN: S/p PEG 6/18. Nocturnal TF's. Passed for Reg diet with thin liquids with SLP. Renal diet. Ecouraged PO. Desitin around PEG tube VTE: PAS, SQH Endo: low dose LT4 ID: None. UA with large leukocytes, many bacteria, and > 50 WBC (0-5 squamous), denies urinary symptoms, wbc pending, afebrile, send ucx Foley: none Dispo: 4NP, therapies. Eval by psych and deemed not to have capacity for decision-making regarding PEG placement and SNF placement. Palliative also engaged. No HCPOA, legal spouse identified. Per palliative note, Patients oldest son, [name] and sister, [name] have been appointed by family to make decisions. Plan for SNF. Insurance auth pending. Renal following. Appreciate recs.     LOS: 29 days   I reviewed nursing notes, Consultant (Nephrology) notes, last 24 h vitals and pain scores, last 48 h intake and output, last 24 h labs and trends, and last 24 h imaging results.  This care required moderate level of medical decision making.    [name], [facility] Surgery 03/01/2024, 9:24 AM Please see Amion for pager number during day hours 7:00am-4:30pm

## 2024-03-01 NOTE — Progress Notes (Signed)
 Mobility Specialist Progress Note:    03/01/24 1048  Mobility  Activity Ambulated with assistance to bathroom  Level of Assistance Contact guard assist, steadying assist  Assistive Device Front wheel walker  Distance Ambulated (ft) 10 ft  Activity Response Tolerated well  Mobility Referral Yes  Mobility visit 1 Mobility  Mobility Specialist Start Time (ACUTE ONLY) 1031  Mobility Specialist Stop Time (ACUTE ONLY) 1041  Mobility Specialist Time Calculation (min) (ACUTE ONLY) 10 min   Pt received in BR requesting assistance to return back to bed. No physical assistance needed. Left in bed, alarm on. Personal belongings and call light within reach. All needs met. RN in room.   Lavanda Pollack Mobility Specialist  Please contact via [employer] or  Rehab Office [phone]

## 2024-03-02 ENCOUNTER — Encounter (HOSPITAL_COMMUNITY): Payer: Self-pay | Admitting: General Surgery

## 2024-03-02 LAB — GLUCOSE, CAPILLARY
Glucose-Capillary: 102 mg/dL — ABNORMAL HIGH (ref 70–99)
Glucose-Capillary: 102 mg/dL — ABNORMAL HIGH (ref 70–99)
Glucose-Capillary: 114 mg/dL — ABNORMAL HIGH (ref 70–99)
Glucose-Capillary: 83 mg/dL (ref 70–99)
Glucose-Capillary: 89 mg/dL (ref 70–99)
Glucose-Capillary: 99 mg/dL (ref 70–99)

## 2024-03-02 LAB — COMPREHENSIVE METABOLIC PANEL WITH GFR
ALT: 20 U/L (ref 0–44)
AST: 15 U/L (ref 15–41)
Albumin: 2.3 g/dL — ABNORMAL LOW (ref 3.5–5.0)
Alkaline Phosphatase: 96 U/L (ref 38–126)
Anion gap: 12 (ref 5–15)
BUN: 101 mg/dL — ABNORMAL HIGH (ref 8–23)
CO2: 19 mmol/L — ABNORMAL LOW (ref 22–32)
Calcium: 10.9 mg/dL — ABNORMAL HIGH (ref 8.9–10.3)
Chloride: 105 mmol/L (ref 98–111)
Creatinine, Ser: 2.73 mg/dL — ABNORMAL HIGH (ref 0.61–1.24)
GFR, Estimated: 24 mL/min — ABNORMAL LOW (ref >60)
Glucose, Bld: 113 mg/dL — ABNORMAL HIGH (ref 70–99)
Potassium: 4.7 mmol/L (ref 3.5–5.1)
Sodium: 136 mmol/L (ref 135–145)
Total Bilirubin: 0.2 mg/dL (ref 0.0–1.2)
Total Protein: 6.7 g/dL (ref 6.5–8.1)

## 2024-03-02 LAB — CBC
HCT: 26.4 % — ABNORMAL LOW (ref 39.0–52.0)
Hemoglobin: 8.1 g/dL — ABNORMAL LOW (ref 13.0–17.0)
MCH: 28.7 pg (ref 26.0–34.0)
MCHC: 30.7 g/dL (ref 30.0–36.0)
MCV: 93.6 fL (ref 80.0–100.0)
Platelets: 336 10*3/uL (ref 150–400)
RBC: 2.82 MIL/uL — ABNORMAL LOW (ref 4.22–5.81)
RDW: 16.2 % — ABNORMAL HIGH (ref 11.5–15.5)
WBC: 8.1 10*3/uL (ref 4.0–10.5)
nRBC: 0 % (ref 0.0–0.2)

## 2024-03-02 LAB — TACROLIMUS LEVEL: Tacrolimus (FK506) - LabCorp: 2 ng/mL — ABNORMAL LOW (ref 5.0–20.0)

## 2024-03-02 LAB — URINE CULTURE: Culture: >=100000 — AB

## 2024-03-02 MED ORDER — MIRTAZAPINE 15 MG PO TBDP
7.5000 mg | ORAL_TABLET | Freq: Every day | ORAL | Status: DC
  Administered 2024-03-02 – 2024-03-06 (×5): 7.5 mg via ORAL
  Filled 2024-03-02 (×5): qty 0.5

## 2024-03-02 MED ORDER — POLYETHYLENE GLYCOL 3350 17 G PO PACK: 17.0000 g | PACK | Freq: Every day | ORAL | Status: AC | PRN

## 2024-03-02 MED ORDER — SODIUM BICARBONATE 650 MG PO TABS: 650.0000 mg | ORAL_TABLET | Freq: Two times a day (BID) | ORAL | Status: AC

## 2024-03-02 MED ORDER — THIAMINE MONONITRATE 100 MG PO TABS
100.0000 mg | ORAL_TABLET | Freq: Every day | ORAL | Status: DC
  Administered 2024-03-02 – 2024-03-06 (×5): 100 mg via ORAL
  Filled 2024-03-02 (×5): qty 1

## 2024-03-02 MED ORDER — VITAMIN C 500 MG PO TABS
500.0000 mg | ORAL_TABLET | Freq: Three times a day (TID) | ORAL | Status: DC
  Administered 2024-03-02 – 2024-03-06 (×15): 500 mg via ORAL
  Filled 2024-03-02 (×15): qty 1

## 2024-03-02 MED ORDER — OXYCODONE HCL 5 MG PO TABS
5.0000 mg | ORAL_TABLET | ORAL | Status: DC | PRN
  Administered 2024-03-04 (×2): 5 mg via ORAL
  Filled 2024-03-02 (×2): qty 1

## 2024-03-02 MED ORDER — ACETAMINOPHEN 325 MG PO TABS: 650.0000 mg | ORAL_TABLET | Freq: Four times a day (QID) | ORAL | Status: AC | PRN

## 2024-03-02 MED ORDER — POLYETHYLENE GLYCOL 3350 17 G PO PACK
17.0000 g | PACK | Freq: Every day | ORAL | Status: DC
  Administered 2024-03-05: 17 g via ORAL
  Filled 2024-03-02 (×3): qty 1

## 2024-03-02 MED ORDER — DOCUSATE SODIUM 100 MG PO CAPS: 100.0000 mg | ORAL_CAPSULE | Freq: Two times a day (BID) | ORAL | Status: AC | PRN

## 2024-03-02 MED ORDER — ASCORBIC ACID 500 MG PO TABS: 500.0000 mg | ORAL_TABLET | Freq: Three times a day (TID) | ORAL | Status: AC

## 2024-03-02 MED ORDER — ACETAMINOPHEN 325 MG PO TABS
650.0000 mg | ORAL_TABLET | Freq: Four times a day (QID) | ORAL | Status: DC
  Administered 2024-03-02 – 2024-03-06 (×12): 650 mg via ORAL
  Filled 2024-03-02 (×18): qty 2

## 2024-03-02 MED ORDER — ZINC OXIDE 40 % EX OINT: TOPICAL_OINTMENT | Freq: Two times a day (BID) | CUTANEOUS | Status: AC

## 2024-03-02 MED ORDER — ADULT MULTIVITAMIN W/MINERALS CH: 1.0000 | ORAL_TABLET | Freq: Every day | ORAL | Status: AC

## 2024-03-02 MED ORDER — VITAMIN B-1 100 MG PO TABS: 100.0000 mg | ORAL_TABLET | Freq: Every day | ORAL | Status: AC

## 2024-03-02 MED ORDER — METOPROLOL TARTRATE 12.5 MG HALF TABLET
12.5000 mg | ORAL_TABLET | Freq: Two times a day (BID) | ORAL | Status: DC
  Administered 2024-03-02 – 2024-03-06 (×10): 12.5 mg via ORAL
  Filled 2024-03-02 (×10): qty 1

## 2024-03-02 MED ORDER — FOLIC ACID 1 MG PO TABS
1.0000 mg | ORAL_TABLET | Freq: Every day | ORAL | Status: DC
  Administered 2024-03-02 – 2024-03-06 (×5): 1 mg via ORAL
  Filled 2024-03-02 (×5): qty 1

## 2024-03-02 MED ORDER — FERROUS SULFATE 325 (65 FE) MG PO TABS: 325.0000 mg | ORAL_TABLET | Freq: Three times a day (TID) | ORAL | Status: AC

## 2024-03-02 MED ORDER — DOCUSATE SODIUM 100 MG PO CAPS
100.0000 mg | ORAL_CAPSULE | Freq: Two times a day (BID) | ORAL | Status: DC
  Administered 2024-03-02 – 2024-03-05 (×4): 100 mg via ORAL
  Filled 2024-03-02 (×8): qty 1

## 2024-03-02 MED ORDER — METHOCARBAMOL 500 MG PO TABS: 500.0000 mg | ORAL_TABLET | Freq: Four times a day (QID) | ORAL | Status: AC | PRN

## 2024-03-02 MED ORDER — TACROLIMUS 1 MG PO CAPS
1.0000 mg | ORAL_CAPSULE | Freq: Two times a day (BID) | ORAL | Status: DC
  Administered 2024-03-02 – 2024-03-06 (×10): 1 mg via ORAL
  Filled 2024-03-02 (×10): qty 1

## 2024-03-02 MED ORDER — IPRATROPIUM-ALBUTEROL 0.5-2.5 (3) MG/3ML IN SOLN: 3.0000 mL | RESPIRATORY_TRACT | Status: AC | PRN

## 2024-03-02 MED ORDER — SODIUM CHLORIDE 0.9 % IV SOLN: INTRAVENOUS | Status: DC

## 2024-03-02 MED ORDER — MIRTAZAPINE 15 MG PO TBDP: 7.5000 mg | ORAL_TABLET | Freq: Every day | ORAL | Status: AC

## 2024-03-02 MED ORDER — METHOCARBAMOL 500 MG PO TABS: 500.0000 mg | ORAL_TABLET | Freq: Four times a day (QID) | ORAL | Status: DC | PRN

## 2024-03-02 MED ORDER — ADULT MULTIVITAMIN W/MINERALS CH
1.0000 | ORAL_TABLET | Freq: Every day | ORAL | Status: DC
  Administered 2024-03-02 – 2024-03-06 (×5): 1 via ORAL
  Filled 2024-03-02 (×5): qty 1

## 2024-03-02 MED ORDER — METOPROLOL TARTRATE 25 MG PO TABS: 12.5000 mg | ORAL_TABLET | Freq: Two times a day (BID) | ORAL | Status: AC

## 2024-03-02 MED ORDER — LEVOTHYROXINE SODIUM 25 MCG PO TABS
25.0000 ug | ORAL_TABLET | Freq: Every day | ORAL | Status: DC
  Administered 2024-03-03 – 2024-03-06 (×4): 25 ug via ORAL
  Filled 2024-03-02 (×4): qty 1

## 2024-03-02 MED ORDER — FERROUS SULFATE 325 (65 FE) MG PO TABS
325.0000 mg | ORAL_TABLET | Freq: Three times a day (TID) | ORAL | Status: DC
  Administered 2024-03-02 – 2024-03-06 (×13): 325 mg via ORAL
  Filled 2024-03-02 (×14): qty 1

## 2024-03-02 MED ORDER — FAMOTIDINE 20 MG PO TABS
10.0000 mg | ORAL_TABLET | Freq: Every day | ORAL | Status: DC
  Administered 2024-03-02 – 2024-03-06 (×5): 10 mg via ORAL
  Filled 2024-03-02 (×5): qty 1

## 2024-03-02 MED ORDER — LEVOTHYROXINE SODIUM 25 MCG PO TABS
25.0000 ug | ORAL_TABLET | Freq: Every day | ORAL | Status: AC
Start: 2024-03-03 — End: ?

## 2024-03-02 MED ORDER — CEPHALEXIN 500 MG PO CAPS
500.0000 mg | ORAL_CAPSULE | Freq: Two times a day (BID) | ORAL | Status: AC
Start: 2024-03-02 — End: ?

## 2024-03-02 NOTE — Progress Notes (Signed)
 Progress Note  9 Days Post-Op  Subjective: Pt denies complaints this AM. Denies pain. He is voiding with good uop (0.6 ml/kg/hr over the last 24 hours). Tolerating tf's and po but not eating much. BM overnight.   Afebrile. No tachycardia or hypotension. K 4.7. Cr downtrending.   Objective: Vital signs in last 24 hours: Temp:  [97.5 F (36.4 C)-98.2 F (36.8 C)] 98.2 F (36.8 C) (06/27 0425) Pulse Rate:  [72-95] 95 (06/27 0425) Resp:  [16-18] 16 (06/27 0425) BP: (116-133)/(71-82) 116/82 (06/27 0425) SpO2:  [94 %-96 %] 94 % (06/27 0425) Weight:  [36 kg] 63 kg (06/27 0500) Last BM Date : 03/01/24  Intake/Output from previous day: 06/26 0701 - 06/27 0700 In: 1020 [P.O.:720] Out: 850 [Urine:850] Intake/Output this shift: Total I/O In: -  Out: 200 [Urine:200]  PE: Gen:  Alert, NAD, oriented x4 this AM Card: Reg rate this morning Pulm: Rate and effort normal Abd: Soft, ND, NT. PEG tube in place, currently clamped, 2cm at the skin, periwound with some maceration but no cellulitis, induration, fluctuance or drainage.  Ext:  No LE edema Neuro:  MAE's, f/c, non-focal  Lab Results:  Recent Labs    02/29/24 0748 03/02/24 0805  WBC 8.1 8.1  HGB 8.0* 8.1*  HCT 26.1* 26.4*  PLT 349 336   BMET Recent Labs    02/29/24 0748 03/01/24 0620  NA 137 138  K 4.5 4.7  CL 103 105  CO2 18* 23  GLUCOSE 96 113*  BUN 139* 119*  CREATININE 3.01* 2.84*  CALCIUM 10.2 10.9*   PT/INR No results for input(s): LABPROT, INR in the last 72 hours. CMP     Component Value Date/Time   NA 138 03/01/2024 0620   K 4.7 03/01/2024 0620   CL 105 03/01/2024 0620   CO2 23 03/01/2024 0620   GLUCOSE 113 (H) 03/01/2024 0620   BUN 119 (H) 03/01/2024 0620   CREATININE 2.84 (H) 03/01/2024 0620   CALCIUM 10.9 (H) 03/01/2024 0620   PROT 4.6 (L) 03/01/2024 0620   ALBUMIN  2.5 (L) 03/01/2024 0620   AST 15 03/01/2024 0620   ALT 22 03/01/2024 0620   ALKPHOS 110 03/01/2024 0620   BILITOT 0.7  03/01/2024 0620   GFRNONAA 23 (L) 03/01/2024 0620   Lipase  No results found for: LIPASE     Studies/Results: No results found.   Anti-infectives: Anti-infectives (From admission, onward)    Start     Dose/Rate Route Frequency Ordered Stop   03/01/24 1145  cephALEXin  (KEFLEX ) capsule 500 mg        500 mg Oral Every 12 hours 03/01/24 1047 03/06/24 0959        Assessment/Plan 73 year old status post fall downstairs with seizure activity   TBI/bilateral subdural hematoma/anterior subarachnoid hemorrhage/skull fracture - per Dr. Joshua  Loaded with Keppra  in ED, completed 1 week BID keppra , F/U CT head slightly increased SDH but exam stable. TBI therapies.  Bilateral rib fractures that were sustained in a fall on 01/21/2024, similar on follow-up CT scan - multimodal pain control, pulm toilet,  Acute hypoxic respiratory failure - self-extubated, weaned back to room air, continue flutter, chest PT, duonebs EtoH abuse and withdrawal - CIWA. MVI, thiamine , folic acid . No longer requiring dex or ativan . Tachycardia - Cards has seen and signed off. EKG. Cont metoprolol .  CKD/AKI - appreciate Nephrology F/U. Cont low potassium diet and nepro for TF's. Hold Prosource. Hyperkalemia 5.5 6/24, given lokelma  and nephrology consulted. They have obtained labs and  renal US . Cr improving and K 4.7 on last check. Hx of liver transplant (2004 and 2012) - cont prograf . Levels pending this am FEN: S/p PEG 6/18. Nocturnal TF's. Passed for Reg diet with thin liquids with SLP. Renal diet. Ecouraged PO. Desitin around PEG tube VTE: PAS, SQH Endo: low dose LT4 ID: Keflex  for Kleb PNA UTI.  Foley: none Dispo: 4NP, therapies. Eval by psych and deemed not to have capacity for decision-making regarding PEG placement and SNF placement. Palliative also engaged. No HCPOA, legal spouse identified. Per palliative note, Patients oldest son, Jaremy and sister, [name] have been appointed by family to make decisions.  Plan for SNF. Insurance auth pending. Renal following. Appreciate recs.     LOS: 30 days   I reviewed nursing notes, Consultant (Nephrology) notes, last 24 h vitals and pain scores, last 48 h intake and output, last 24 h labs and trends, and last 24 h imaging results.  This care required moderate level of medical decision making.    Ozell CHRISTELLA Shaper, [facility] Surgery 03/02/2024, 8:49 AM Please see Amion for pager number during day hours 7:00am-4:30pm

## 2024-03-02 NOTE — TOC Progression Note (Addendum)
 Transition of Care [facility]) - Progression Note    Patient Details  Name: Colyn Miron MRN: [id number] Date of Birth: 1951-07-01  [facility] CM/SW Contact  Jacai Kipp E Cybill Uriegas, LCSW Phone Number: 03/02/2024, 9:06 AM  Clinical Narrative:    CSW reached out to [place] with [place] [facility], she states they are still working on a one time Community education officer with patient's out of state [employer] plan for [employer].   11:20- [place] with [facility] states they received the case agreement, they are waiting on their legal team to sign it. [place] states they should be able to take patient today, latest would be tomorrow. [place] requested CSW set up transport for after 3pm today and have PA put in DC Summary before 3pm today (regardless of DC today or tomorrow so that meds can be ordered). Updated PA, RN, and patient's sister [name].  1:30- [place] with [facility] states they are still waiting on their legal team.   PTAR pushed back , now tentatively arranged for 430pm.  Expected Discharge Plan: Skilled Nursing Facility Barriers to Discharge: Continued Medical Work up  Expected Discharge Plan and Services In-house Referral: Clinical Social Work     Living arrangements for the past 2 months: Single Family Home                                       Social Determinants of Health (SDOH) Interventions SDOH Screenings   Food Insecurity: No Food Insecurity (02/10/2024)  Housing: Low Risk  (02/10/2024)  Transportation Needs: No Transportation Needs (02/10/2024)  Utilities: Not At Risk (02/10/2024)  Social Connections: Moderately Integrated (02/10/2024)    Readmission Risk Interventions     No data to display

## 2024-03-02 NOTE — Progress Notes (Signed)
 Spoke with [name] in security to see if pt has any belongings there.  Pt states he is looking for his wallet.  Pt is also confused.  RN reaching out to family to see if they have this also.  Waiting to hear back from security about any potential belongings that may be locked up.

## 2024-03-02 NOTE — Progress Notes (Signed)
 Occupational Therapy Treatment Patient Details Name: [name] MRN: [id number] DOB: 1950/09/16 Today's Date: 03/02/2024   History of present illness 73 yo male presenting to ED 5/28 after being found seizing after falling down stairs. CTH bil SDH and SAH with trace IVH and non-depressed occipital skull fx. Intubated 5/28, self-extubated 5/30. Recently sustained bilateral rib fxs after a fall 5/17. Imaging also showed stable subacute fractures involving the right sacral ala, right superior and inferior rami, and left parasymphyseal region along with stable superior endplate compression deformities at T3, T4, and T5 since 01/21/2024. S/p EGD and PEG tube placement 6/18. PMH includes ETOH abuse s/p two liver transplants, 5/17 fall with R sacral ala superior and inferior rami fx, L superior endplate compression deformities at T3 T4 T5   OT comments  Pt. Seen for skilled OT treatment session.  Limited session as pt. Declined in room mobility for adls and also sitting up in recliner.  Bed mobility with S in and out to eob.  LB dressing task set up seated eob.  Able to side step towards hob x3 with rw and CGA/MIN A.  During side step partial LOB but pt. Self corrected with UE support on RW.  Cont. With acute OT POC.        If plan is discharge home, recommend the following:  A little help with walking and/or transfers;A little help with bathing/dressing/bathroom;Assistance with cooking/housework;Direct supervision/assist for medications management;Direct supervision/assist for financial management;Help with stairs or ramp for entrance;Assist for transportation   Equipment Recommendations       Recommendations for Other Services      Precautions / Restrictions Precautions Precautions: Fall Recall of Precautions/Restrictions: Impaired Precaution/Restrictions Comments: PEG tube, abdominal binder Restrictions Other Position/Activity Restrictions: Dr. Paola confirmed no spinal precautions or  weight bearing restrictions 5/31       Mobility Bed Mobility Overal bed mobility: Needs Assistance Bed Mobility: Supine to Sit, Sit to Supine     Supine to sit: Supervision, HOB elevated Sit to supine: Supervision   General bed mobility comments: Pt sat up on L side of bed with increased time. He brought BLE off EOB, elevated trunk, and scooted fwd til feet support without physical assist or cueing. Returned to bed and repositioned himself in the center.    Transfers Overall transfer level: Needs assistance Equipment used: Rolling walker (2 wheels) Transfers: Sit to/from Stand Sit to Stand: Contact guard assist, Supervision           General transfer comment: standing eob side step to L towards hob 3 steps with cga/min a with a partial lob but self corrected     Balance                                           ADL either performed or assessed with clinical judgement   ADL Overall ADL's : Needs assistance/impaired                     Lower Body Dressing: Supervision/safety Lower Body Dressing Details (indicate cue type and reason): able to figure four BLEs for donning/management of socks             Functional mobility during ADLs: Minimal assistance;Contact guard assist;Rolling walker (2 wheels);Cueing for safety General ADL Comments: pt. reports he has just returned from the b.room also declines oob to chair. agreeable to bed mobility and lb  dressing seated eob    Extremity/Trunk Assessment              Vision       Perception     Praxis     Communication Communication Communication: No apparent difficulties Factors Affecting Communication: Hearing impaired   Cognition Arousal: Alert Behavior During Therapy: WFL for tasks assessed/performed                                 Following commands: Impaired Following commands impaired: Only follows one step commands consistently, Follows one step commands with  increased time      Cueing   Cueing Techniques: Verbal cues, Tactile cues, Visual cues  Exercises      Shoulder Instructions       General Comments      Pertinent Vitals/ Pain       Pain Assessment Pain Assessment: No/denies pain  Home Living                                          Prior Functioning/Environment              Frequency  Min 2X/week        Progress Toward Goals  OT Goals(current goals can now be found in the care plan section)  Progress towards OT goals: Progressing toward goals     Plan      Co-evaluation                 AM-PAC OT 6 Clicks Daily Activity     Outcome Measure   Help from another person eating meals?: None Help from another person taking care of personal grooming?: A Little Help from another person toileting, which includes using toliet, bedpan, or urinal?: A Little Help from another person bathing (including washing, rinsing, drying)?: A Little Help from another person to put on and taking off regular upper body clothing?: A Little Help from another person to put on and taking off regular lower body clothing?: A Little 6 Click Score: 19    End of Session Equipment Utilized During Treatment: Rolling walker (2 wheels)  OT Visit Diagnosis: Unsteadiness on feet (R26.81);Other abnormalities of gait and mobility (R26.89);Muscle weakness (generalized) (M62.81)   Activity Tolerance Patient tolerated treatment well   Patient Left with call bell/phone within reach;in bed;with bed alarm set   Nurse Communication Other (comment) (secure chat rn session details)        Time: 8459-8450 OT Time Calculation (min): 9 min  Charges: OT General Charges $OT Visit: 1 Visit OT Treatments $Self Care/Home Management : 8-22 mins  Randall, COTA/L Acute Rehabilitation [phone]   [name] Nest Lorraine-COTA/L  03/02/2024, 4:08 PM

## 2024-03-02 NOTE — Progress Notes (Signed)
 AVS completed for discharge packet and placed with chart.

## 2024-03-02 NOTE — Progress Notes (Signed)
 Patient ID: [name], male   DOB: 07-30-1951, 73 y.o.   MRN: [id number] S:No new complaints.  Wants to go home. O:BP 116/80 (BP Location: Right Arm)   Pulse 96   Temp 97.7 F (36.5 C) (Oral)   Resp 17   Ht 5' 8 (1.727 m)   Wt 63 kg   SpO2 97%   BMI 21.12 kg/m   Intake/Output Summary (Last 24 hours) at 03/02/2024 1133 Last data filed at 03/02/2024 0845 Gross per 24 hour  Intake 760 ml  Output 650 ml  Net 110 ml   Intake/Output: I/O last 3 completed shifts: In: 1020 [P.O.:720; Other:300] Out: 1250 [Urine:1250]  Intake/Output this shift:  Total I/O In: 100 [P.O.:100] Out: 200 [Urine:200] Weight change: 7 kg Gen: NAD CVS: RRR Resp:CTA Abd: +BS, soft, NT/ND Ext: no edema  Recent Labs  Lab 02/25/24 0847 02/26/24 0714 02/27/24 0658 02/28/24 0900 02/29/24 0748 03/01/24 0620 03/02/24 0805  NA 136 135 136 135 137 138 136  K 5.1 4.9 5.3* 5.5* 4.5 4.7 4.7  CL 103 102 99 98 103 105 105  CO2 21* 21* 23 21* 18* 23 19*  GLUCOSE 112* 134* 119* 124* 96 113* 113*  BUN 129* 141* 152* 151* 139* 119* 101*  CREATININE 2.72* 2.79* 2.86* 3.20* 3.01* 2.84* 2.73*  ALBUMIN  2.6* 2.4* 2.4* 2.8* 2.3* 2.5* 2.3*  CALCIUM 10.6* 10.8* 11.2* 11.5* 10.2 10.9* 10.9*  PHOS 3.4 3.6 4.4 5.2* 4.4  --   --   AST  --   --   --   --  18 15 15   ALT  --   --   --   --  22 22 20    Liver Function Tests: Recent Labs  Lab 02/29/24 0748 03/01/24 0620 03/02/24 0805  AST 18 15 15   ALT 22 22 20   ALKPHOS 92 110 96  BILITOT 0.4 0.7 0.2  PROT 6.3* 4.6* 6.7  ALBUMIN  2.3* 2.5* 2.3*   No results for input(s): LIPASE, AMYLASE in the last 168 hours. Recent Labs  Lab 02/25/24 0847  AMMONIA 13   CBC: Recent Labs  Lab 02/29/24 0748 03/02/24 0805  WBC 8.1 8.1  HGB 8.0* 8.1*  HCT 26.1* 26.4*  MCV 92.9 93.6  PLT 349 336   Cardiac Enzymes: No results for input(s): CKTOTAL, CKMB, CKMBINDEX, TROPONINI in the last 168 hours. CBG: Recent Labs  Lab 03/01/24 1708 03/01/24 2018  03/02/24 0026 03/02/24 0428 03/02/24 0852  GLUCAP 93 104* 102* 114* 99    Iron Studies: No results for input(s): IRON, TIBC, TRANSFERRIN, FERRITIN in the last 72 hours. Studies/Results: No results found.  sodium chloride    Intravenous Once   acetaminophen   650 mg Oral Q6H   ascorbic acid   500 mg Oral TID   cephALEXin   500 mg Oral Q12H   docusate sodium   100 mg Oral BID   famotidine   10 mg Oral Daily   feeding supplement (NEPRO CARB STEADY)  1,000 mL Per Tube Q24H   ferrous sulfate   325 mg Oral TID WC   folic acid   1 mg Oral Daily   heparin  injection (subcutaneous)  5,000 Units Subcutaneous Q8H   [START ON 03/03/2024] levothyroxine   25 mcg Oral Q0600   liver oil-zinc  oxide   Topical BID   metoprolol  tartrate  12.5 mg Oral BID   mirtazapine   7.5 mg Oral QHS   multivitamin with minerals  1 tablet Oral Daily   mouth rinse  15 mL Mouth Rinse 4 times per day  polyethylene glycol  17 g Oral Daily   sodium bicarbonate   650 mg Oral BID   tacrolimus   1 mg Oral BID   tamsulosin   0.4 mg Oral QPC supper   thiamine   100 mg Oral Daily    BMET    Component Value Date/Time   NA 136 03/02/2024 0805   K 4.7 03/02/2024 0805   CL 105 03/02/2024 0805   CO2 19 (L) 03/02/2024 0805   GLUCOSE 113 (H) 03/02/2024 0805   BUN 101 (H) 03/02/2024 0805   CREATININE 2.73 (H) 03/02/2024 0805   CALCIUM 10.9 (H) 03/02/2024 0805   GFRNONAA 24 (L) 03/02/2024 0805   CBC    Component Value Date/Time   WBC 8.1 03/02/2024 0805   RBC 2.82 (L) 03/02/2024 0805   HGB 8.1 (L) 03/02/2024 0805   HCT 26.4 (L) 03/02/2024 0805   PLT 336 03/02/2024 0805   MCV 93.6 03/02/2024 0805   MCH 28.7 03/02/2024 0805   MCHC 30.7 03/02/2024 0805   RDW 16.2 (H) 03/02/2024 0805    Assessment/Plan:   AKI/CKD stage IIIb - BUN/Cr have markedly risen over the past week.  Poor po intake documented on I's/O's.  Will check renal US  to r/o obstruction and evaluate size and contour of kidneys.  Will check urine lytes and  TAC level.  Rising calcium over the last 5 days, see below for plan.  Started IVF's NS at 100 mL/hr with improvement of BUN/Cr.  Continue with IVF's for now.  Continues to have improved UOP and Scr.  UA with large leukocytes and mild proteinuria.  Renal US  without obstruction but with increased echogenicity.  Tacrolimus  level is low (per pt they lowered his dose due to recurrent skin cancer).  No recent contrast studies since 02/01/24.  No uremic symptoms at this time but does have a slightly decreased appetite.  Will continue to follow but is nearing his baseline SCr in the mid 2's..   Avoid nephrotoxic medications including NSAIDs and iodinated intravenous contrast exposure unless the latter is absolutely indicated.   Preferred narcotic agents for pain control are hydromorphone , fentanyl , and methadone. Morphine  should not be used.  Avoid Baclofen and avoid oral sodium phosphate  and magnesium citrate based laxatives / bowel preps.  Continue strict Input and Output monitoring. Will monitor the patient closely with you and intervene or adjust therapy as indicated by changes in clinical status/labs  Hyperkalemia - due to #1.  Agree with Lokelma .  Also need to change tube feeds to Nepro due to AKI/CKD stage IIIb.  Hold prosource for now.  Improved this morning. AGMA - presumably due to #1.  Will start sodium bicarb tabs and follow. Hypercalcemia - will start IVF's as above and follow.  iPTH low at 6, negative SPEP and PTH-related peptide pending.  Will restart IVF's given persistently elevated calcium level.  Change of tube feeds as above. 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 Anemia - has not been checked for 2 weeks, will recheck CBC and iron studies H/o liver transplant - on prograf .  Will check tacrolimus  level.  Was on 1.5 mg bid up until 02/23/24 and now on 1 mg bid.   SDH/SAH/skull fracture - per trauma Bilateral rib fractures - per trauma Etoh abuse  and withdrawal - CIWA, MVI, thiamine , folic acid .  Off of ativan  and dex.  Fairy RONAL Sellar, MD [facility]

## 2024-03-02 NOTE — Progress Notes (Incomplete)
 Nutrition Follow-up  DOCUMENTATION CODES:   Severe malnutrition in context of chronic illness  INTERVENTION:  *** Continue current diet as ordered currently - renal diet with 1200 ml fluid restriction  Encourage PO intake Room service with assist  Continue nocturnal feedings via PEG: (1800-0600) Nepro Carb Steady @ 83 ml/hr x 12 hours (996 ml per day) Prosource TF20 1x/d - On hold  150 ml FWF Q6H - FWF ok per Nephrology  Provides 1,843 kcal (100% calorie needs), 100 gm protein (100% protein needs), 698 ml water  (1,298 ml water  daily TF + FWF)  Continue: 100 mg thiamine , MVI with minerals, and 1 mg folic acid  daily Daily weights Continue Mighty Shake TID with meals, each supplement provides 330 kcals and 9 grams of protein  *Pt has refused Ensures, Nepro shakes, Boost Breezeand magic cups.   NUTRITION DIAGNOSIS:   Severe Malnutrition related to chronic illness as evidenced by severe fat depletion, severe muscle depletion.  ***  GOAL:   Patient will meet greater than or equal to 90% of their needs  ***  MONITOR:   TF tolerance  REASON FOR ASSESSMENT:   Consult Enteral/tube feeding initiation and management (nocturnal TF)  ASSESSMENT:   Pt with PMH of ETOH abuse s/p 2 liver transplants, CKD 3b, admitted after fall, found at the bottom of the stairs, admitted with TBI/bilateral SDH, anterior SAH, skull fx, and bilateral rib fxs (from 5/17 fall).  5/30 - cortrak placed (gastric), s/p MBSS- dysphagia 2 diet with nectar thick liquids 5/31 - s/p BSE- dysphagia 2 diet with nectar thick liquids, transitioned to nocturnal feeds 6/04 - transitioned back to continuous feeds as pt with very poor PO intake 6/07 - Transitioned to Nocturnal tube feeds  6/08 - Patient pulled out cortrak  6/07 - Nocturnal tube feeds started to meet 80% of needs 6/09 - Cortrak replaced 6/10 - Nocturnal tube feeds increased to meet 100% of needs due to poor PO intake 6/18 - s/p EGD and PEG  placement  ***   Admit weight: 62 kg Current weight: 63 kg   Average Meal Intake: ***-***: ***% intake x *** recorded meals  Nutritionally Relevant Medications: Scheduled Meds:  sodium chloride    Intravenous Once   acetaminophen   650 mg Oral Q6H   ascorbic acid   500 mg Oral TID   cephALEXin   500 mg Oral Q12H   docusate sodium   100 mg Oral BID   famotidine   10 mg Oral Daily   feeding supplement (NEPRO CARB STEADY)  1,000 mL Per Tube Q24H   ferrous sulfate   325 mg Oral TID WC   folic acid   1 mg Oral Daily   heparin  injection (subcutaneous)  5,000 Units Subcutaneous Q8H   [START ON 03/03/2024] levothyroxine   25 mcg Oral Q0600   liver oil-zinc  oxide   Topical BID   metoprolol  tartrate  12.5 mg Oral BID   mirtazapine   7.5 mg Oral QHS   multivitamin with minerals  1 tablet Oral Daily   mouth rinse  15 mL Mouth Rinse 4 times per day   polyethylene glycol  17 g Oral Daily   sodium bicarbonate   650 mg Oral BID   tacrolimus   1 mg Oral BID   tamsulosin   0.4 mg Oral QPC supper   thiamine   100 mg Oral Daily    Labs Reviewed: *** CBG ranges from ***-*** mg/dL over the last 24 hours HgbA1c ***   NUTRITION - FOCUSED PHYSICAL EXAM:  {RD Focused Exam List:21252}  Diet Order:  Diet Order             Diet renal with fluid restriction Fluid restriction: 1200 mL Fluid; Room service appropriate? Yes; Fluid consistency: Thin  Diet effective now                   EDUCATION NEEDS:   Not appropriate for education at this time  Skin:  Skin Assessment: Skin Integrity Issues: Skin Integrity Issues:: Other (Comment) Other: head laceration, skin tear to lt lower arm  Last BM:  6/19  Height:   Ht Readings from Last 1 Encounters:  02/22/24 5' 8 (1.727 m)    Weight:   Wt Readings from Last 1 Encounters:  03/02/24 63 kg    Ideal Body Weight:  70 kg  BMI:  Body mass index is 21.12 kg/m.  Estimated Nutritional Needs:   Kcal:  1800-2000  Protein:  95-115  grams  Fluid:  > 1.8 L/day    ***

## 2024-03-02 NOTE — TOC Progression Note (Signed)
 Transition of Care [facility]) - Progression Note    Patient Details  Name: [name] MRN: [id number] Date of Birth: 1950-11-15  Transition of [facility] CM/SW Contact  Authur Cubit E Laney Bagshaw, LCSW Phone Number: 03/02/2024, 2:35 PM  Clinical Narrative:    [place] with [facility] states they cannot take patient until Monday now due to having to order a feeding pump. CSW called back and informed her that per MD, patient can DC without feeding tube due to this not being his only source of nutrition. [place] states they are still waiting to hear from legal, so will still need to delay DC to Monday.  PTAR cancelled. Care team notified. Patient's sister notified.    Expected Discharge Plan: Skilled Nursing Facility Barriers to Discharge: Continued Medical Work up  Expected Discharge Plan and Services In-house Referral: Clinical Social Work     Living arrangements for the past 2 months: Single Family Home Expected Discharge Date: 03/02/24                                     Social Determinants of Health (SDOH) Interventions SDOH Screenings   Food Insecurity: No Food Insecurity (02/10/2024)  Housing: Low Risk  (02/10/2024)  Transportation Needs: No Transportation Needs (02/10/2024)  Utilities: Not At Risk (02/10/2024)  Social Connections: Moderately Integrated (02/10/2024)    Readmission Risk Interventions     No data to display

## 2024-03-02 NOTE — Progress Notes (Signed)
 This nurse reach out to Dr. Rayburn to get clarification about the 100 mL of NS. Dr. Rayburn verbally instructed to hold NS infusion since his been discharge.

## 2024-03-03 LAB — COMPREHENSIVE METABOLIC PANEL WITH GFR
ALT: 20 U/L (ref 0–44)
AST: 18 U/L (ref 15–41)
Albumin: 2.3 g/dL — ABNORMAL LOW (ref 3.5–5.0)
Alkaline Phosphatase: 100 U/L (ref 38–126)
Anion gap: 15 (ref 5–15)
BUN: 86 mg/dL — ABNORMAL HIGH (ref 8–23)
CO2: 17 mmol/L — ABNORMAL LOW (ref 22–32)
Calcium: 10.3 mg/dL (ref 8.9–10.3)
Chloride: 110 mmol/L (ref 98–111)
Creatinine, Ser: 2.52 mg/dL — ABNORMAL HIGH (ref 0.61–1.24)
GFR, Estimated: 27 mL/min — ABNORMAL LOW (ref >60)
Glucose, Bld: 108 mg/dL — ABNORMAL HIGH (ref 70–99)
Potassium: 4.6 mmol/L (ref 3.5–5.1)
Sodium: 142 mmol/L (ref 135–145)
Total Bilirubin: 0.8 mg/dL (ref 0.0–1.2)
Total Protein: 6.3 g/dL — ABNORMAL LOW (ref 6.5–8.1)

## 2024-03-03 LAB — GLUCOSE, CAPILLARY
Glucose-Capillary: 103 mg/dL — ABNORMAL HIGH (ref 70–99)
Glucose-Capillary: 105 mg/dL — ABNORMAL HIGH (ref 70–99)
Glucose-Capillary: 85 mg/dL (ref 70–99)
Glucose-Capillary: 89 mg/dL (ref 70–99)
Glucose-Capillary: 94 mg/dL (ref 70–99)
Glucose-Capillary: 99 mg/dL (ref 70–99)

## 2024-03-03 NOTE — Progress Notes (Signed)
 Assessment & Plan: 73 year old status post fall downstairs with seizure activity   TBI/bilateral subdural hematoma/anterior subarachnoid hemorrhage/skull fracture - per Dr. Joshua  Loaded with Keppra  in ED, completed 1 week BID keppra , F/U CT head slightly increased SDH but exam stable. TBI therapies.  Bilateral rib fractures that were sustained in a fall on 01/21/2024, similar on follow-up CT scan - multimodal pain control, pulm toilet,  Acute hypoxic respiratory failure - self-extubated, weaned back to room air, continue flutter, chest PT, duonebs EtoH abuse and withdrawal - CIWA. MVI, thiamine , folic acid . No longer requiring dex or ativan . Tachycardia - Cards has seen and signed off. EKG. Cont metoprolol .  CKD/AKI - appreciate Nephrology F/U Hx of liver transplant (2004 and 2012) - cont prograf  FEN: S/p PEG 6/18. Nocturnal TF's.  VTE: PAS, SQH Endo: low dose LT4 ID: Keflex  for Kleb PNA UTI.  Foley: none Dispo: Eval by psych and deemed not to have capacity for decision-making regarding PEG placement and SNF placement. Palliative also engaged. No HCPOA, legal spouse identified. Per palliative note, Patients oldest son, Tarek and sister, [name] have been appointed by family to make decisions. Plan for SNF. Insurance auth pending. Renal following. Appreciate recs.   Await transfer to SNF - [facility] - on Monday 6/30.        Krystal Spinner, MD [facility] practice Office: [phone]        Chief Complaint: Fall, seizure  Subjective: Patient in bed, family at bedside.  Comfortable.  No complaints.  Objective: Vital signs in last 24 hours: Temp:  [97.7 F (36.5 C)-98.3 F (36.8 C)] 98.3 F (36.8 C) (06/28 0751) Pulse Rate:  [81-96] 89 (06/28 0751) Resp:  [16-18] 17 (06/28 0751) BP: (116-143)/(69-80) 123/79 (06/28 0751) SpO2:  [96 %-100 %] 96 % (06/28 0751) Weight:  [63 kg] 63 kg (06/28 0500) Last BM Date : 03/02/24  Intake/Output from previous  day: 06/27 0701 - 06/28 0700 In: 1508.2 [P.O.:370; I.V.:1138.2] Out: 300 [Urine:300] Intake/Output this shift: No intake/output data recorded.  Physical Exam: HEENT - sclerae clear, mucous membranes moist Abdomen - soft Ext - no edema, non-tender  Lab Results:  Recent Labs    03/02/24 0805  WBC 8.1  HGB 8.1*  HCT 26.4*  PLT 336   BMET Recent Labs    03/01/24 0620 03/02/24 0805  NA 138 136  K 4.7 4.7  CL 105 105  CO2 23 19*  GLUCOSE 113* 113*  BUN 119* 101*  CREATININE 2.84* 2.73*  CALCIUM 10.9* 10.9*   PT/INR No results for input(s): LABPROT, INR in the last 72 hours. Comprehensive Metabolic Panel:    Component Value Date/Time   NA 136 03/02/2024 0805   NA 138 03/01/2024 0620   K 4.7 03/02/2024 0805   K 4.7 03/01/2024 0620   CL 105 03/02/2024 0805   CL 105 03/01/2024 0620   CO2 19 (L) 03/02/2024 0805   CO2 23 03/01/2024 0620   BUN 101 (H) 03/02/2024 0805   BUN 119 (H) 03/01/2024 0620   CREATININE 2.73 (H) 03/02/2024 0805   CREATININE 2.84 (H) 03/01/2024 0620   GLUCOSE 113 (H) 03/02/2024 0805   GLUCOSE 113 (H) 03/01/2024 0620   CALCIUM 10.9 (H) 03/02/2024 0805   CALCIUM 10.9 (H) 03/01/2024 0620   AST 15 03/02/2024 0805   AST 15 03/01/2024 0620   ALT 20 03/02/2024 0805   ALT 22 03/01/2024 0620   ALKPHOS 96 03/02/2024 0805   ALKPHOS 110 03/01/2024 9379  BILITOT 0.2 03/02/2024 0805   BILITOT 0.7 03/01/2024 0620   PROT 6.7 03/02/2024 0805   PROT 4.6 (L) 03/01/2024 0620   ALBUMIN  2.3 (L) 03/02/2024 0805   ALBUMIN  2.5 (L) 03/01/2024 0620    Studies/Results: No results found.    Krystal Spinner 03/03/2024  Patient ID: [name], male   DOB: [birth year], 73 y.o.   MRN: [id number]

## 2024-03-03 NOTE — Plan of Care (Signed)

## 2024-03-03 NOTE — Progress Notes (Signed)
 Patient ID: Delno Blaisdell, male   DOB: May 02, 1951, 73 y.o.   MRN: [id number] S: Feeling better today.  Was to be discharged to SNF yesterday, however that did not happen as [facility] couldn't take him until Monday.  O:BP 123/79 (BP Location: Right Arm)   Pulse 89   Temp 98.3 F (36.8 C) (Oral)   Resp 17   Ht 5' 8 (1.727 m)   Wt 63 kg   SpO2 96%   BMI 21.12 kg/m   Intake/Output Summary (Last 24 hours) at 03/03/2024 0936 Last data filed at 03/03/2024 0900 Gross per 24 hour  Intake 4769.41 ml  Output 100 ml  Net 4669.41 ml   Intake/Output: I/O last 3 completed shifts: In: 1808.2 [P.O.:610; I.V.:1138.2; Other:60] Out: 600 [Urine:600]  Intake/Output this shift:  Total I/O In: 3361.2 [I.V.:399.9; NG/GT:2961.3] Out: -  Weight change: 0 kg Gen: NAD CVS: RRR Resp: CTA Abd: +BS, soft, NT/ND Ext: no edema  Recent Labs  Lab 02/26/24 0714 02/27/24 0658 02/28/24 0900 02/29/24 0748 03/01/24 0620 03/02/24 0805  NA 135 136 135 137 138 136  K 4.9 5.3* 5.5* 4.5 4.7 4.7  CL 102 99 98 103 105 105  CO2 21* 23 21* 18* 23 19*  GLUCOSE 134* 119* 124* 96 113* 113*  BUN 141* 152* 151* 139* 119* 101*  CREATININE 2.79* 2.86* 3.20* 3.01* 2.84* 2.73*  ALBUMIN  2.4* 2.4* 2.8* 2.3* 2.5* 2.3*  CALCIUM 10.8* 11.2* 11.5* 10.2 10.9* 10.9*  PHOS 3.6 4.4 5.2* 4.4  --   --   AST  --   --   --  18 15 15   ALT  --   --   --  22 22 20    Liver Function Tests: Recent Labs  Lab 02/29/24 0748 03/01/24 0620 03/02/24 0805  AST 18 15 15   ALT 22 22 20   ALKPHOS 92 110 96  BILITOT 0.4 0.7 0.2  PROT 6.3* 4.6* 6.7  ALBUMIN  2.3* 2.5* 2.3*   No results for input(s): LIPASE, AMYLASE in the last 168 hours. No results for input(s): AMMONIA in the last 168 hours. CBC: Recent Labs  Lab 02/29/24 0748 03/02/24 0805  WBC 8.1 8.1  HGB 8.0* 8.1*  HCT 26.1* 26.4*  MCV 92.9 93.6  PLT 349 336   Cardiac Enzymes: No results for input(s): CKTOTAL, CKMB, CKMBINDEX, TROPONINI in the last 168  hours. CBG: Recent Labs  Lab 03/02/24 1612 03/02/24 2014 03/03/24 0018 03/03/24 0426 03/03/24 0748  GLUCAP 89 83 99 103* 105*    Iron Studies: No results for input(s): IRON, TIBC, TRANSFERRIN, FERRITIN in the last 72 hours. Studies/Results: No results found.  sodium chloride    Intravenous Once   acetaminophen   650 mg Oral Q6H   ascorbic acid   500 mg Oral TID   cephALEXin   500 mg Oral Q12H   docusate sodium   100 mg Oral BID   famotidine   10 mg Oral Daily   feeding supplement (NEPRO CARB STEADY)  1,000 mL Per Tube Q24H   ferrous sulfate   325 mg Oral TID WC   folic acid   1 mg Oral Daily   heparin  injection (subcutaneous)  5,000 Units Subcutaneous Q8H   levothyroxine   25 mcg Oral Q0600   liver oil-zinc  oxide   Topical BID   metoprolol  tartrate  12.5 mg Oral BID   mirtazapine   7.5 mg Oral QHS   multivitamin with minerals  1 tablet Oral Daily   mouth rinse  15 mL Mouth Rinse 4 times per day  polyethylene glycol  17 g Oral Daily   sodium bicarbonate   650 mg Oral BID   tacrolimus   1 mg Oral BID   tamsulosin   0.4 mg Oral QPC supper   thiamine   100 mg Oral Daily    BMET    Component Value Date/Time   NA 136 03/02/2024 0805   K 4.7 03/02/2024 0805   CL 105 03/02/2024 0805   CO2 19 (L) 03/02/2024 0805   GLUCOSE 113 (H) 03/02/2024 0805   BUN 101 (H) 03/02/2024 0805   CREATININE 2.73 (H) 03/02/2024 0805   CALCIUM 10.9 (H) 03/02/2024 0805   GFRNONAA 24 (L) 03/02/2024 0805   CBC    Component Value Date/Time   WBC 8.1 03/02/2024 0805   RBC 2.82 (L) 03/02/2024 0805   HGB 8.1 (L) 03/02/2024 0805   HCT 26.4 (L) 03/02/2024 0805   PLT 336 03/02/2024 0805   MCV 93.6 03/02/2024 0805   MCH 28.7 03/02/2024 0805   MCHC 30.7 03/02/2024 0805   RDW 16.2 (H) 03/02/2024 0805    Assessment/Plan:   AKI/CKD stage IIIb - BUN/Cr have markedly risen over the past week.  Poor po intake documented on I's/O's.  Will check renal US  to r/o obstruction and evaluate size and contour of  kidneys.  Will check urine lytes and TAC level.  Rising calcium over the last 5 days, see below for plan.  Started IVF's NS at 100 mL/hr with improvement of BUN/Cr.  Continue with IVF's for now.  Continues to have improved UOP and Scr.  UA with large leukocytes and mild proteinuria.  Renal US  without obstruction but with increased echogenicity.  Tacrolimus  level is low (per pt they lowered his dose due to recurrent skin cancer).  No recent contrast studies since 02/01/24.  No uremic symptoms at this time but does have a slightly decreased appetite.  Will continue to follow but is nearing his baseline SCr in the mid 2's. Was started on IVF's yesterday due to poor po intake but doing better today.  Scr pending for today. Avoid nephrotoxic medications including NSAIDs and iodinated intravenous contrast exposure unless the latter is absolutely indicated.   Preferred narcotic agents for pain control are hydromorphone , fentanyl , and methadone. Morphine  should not be used.  Avoid Baclofen and avoid oral sodium phosphate  and magnesium citrate based laxatives / bowel preps.  Continue strict Input and Output monitoring. Will monitor the patient closely with you and intervene or adjust therapy as indicated by changes in clinical status/labs  Hyperkalemia - due to #1.  Agree with Lokelma .  Also need to change tube feeds to Nepro due to AKI/CKD stage IIIb.  Hold prosource for now.  Improved this morning. AGMA - presumably due to #1.  Will start sodium bicarb tabs and follow. Hypercalcemia - will start IVF's as above and follow.  iPTH low at 6, negative SPEP and PTH-related peptide pending.  Will restart IVF's given persistently elevated calcium level.  Change of tube feeds as above. 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 Anemia - has not been checked for 2 weeks, will recheck CBC and iron studies H/o liver transplant - on prograf .  Will check tacrolimus  level.  Was on  1.5 mg bid up until 02/23/24 and now on 1 mg bid.   SDH/SAH/skull fracture - per trauma Bilateral rib fractures - per trauma Etoh abuse and withdrawal - CIWA, MVI, thiamine , folic acid .  Off of ativan  and dex.  Will eventually need rehab and most likely inpatient rehab given his history.  Fairy RONAL Sellar, MD [facility]

## 2024-03-03 NOTE — Plan of Care (Signed)

## 2024-03-04 DIAGNOSIS — Z515 Encounter for palliative care: Secondary | ICD-10-CM | POA: Diagnosis not present

## 2024-03-04 DIAGNOSIS — Z7189 Other specified counseling: Secondary | ICD-10-CM | POA: Diagnosis not present

## 2024-03-04 LAB — COMPREHENSIVE METABOLIC PANEL WITH GFR
ALT: 19 U/L (ref 0–44)
AST: 18 U/L (ref 15–41)
Albumin: 2.2 g/dL — ABNORMAL LOW (ref 3.5–5.0)
Alkaline Phosphatase: 95 U/L (ref 38–126)
Anion gap: 10 (ref 5–15)
BUN: 71 mg/dL — ABNORMAL HIGH (ref 8–23)
CO2: 17 mmol/L — ABNORMAL LOW (ref 22–32)
Calcium: 10 mg/dL (ref 8.9–10.3)
Chloride: 113 mmol/L — ABNORMAL HIGH (ref 98–111)
Creatinine, Ser: 2.2 mg/dL — ABNORMAL HIGH (ref 0.61–1.24)
GFR, Estimated: 31 mL/min — ABNORMAL LOW (ref >60)
Glucose, Bld: 122 mg/dL — ABNORMAL HIGH (ref 70–99)
Potassium: 4.3 mmol/L (ref 3.5–5.1)
Sodium: 140 mmol/L (ref 135–145)
Total Bilirubin: 0.2 mg/dL (ref 0.0–1.2)
Total Protein: 5.8 g/dL — ABNORMAL LOW (ref 6.5–8.1)

## 2024-03-04 LAB — GLUCOSE, CAPILLARY
Glucose-Capillary: 104 mg/dL — ABNORMAL HIGH (ref 70–99)
Glucose-Capillary: 105 mg/dL — ABNORMAL HIGH (ref 70–99)
Glucose-Capillary: 105 mg/dL — ABNORMAL HIGH (ref 70–99)
Glucose-Capillary: 89 mg/dL (ref 70–99)
Glucose-Capillary: 93 mg/dL (ref 70–99)
Glucose-Capillary: 94 mg/dL (ref 70–99)
Glucose-Capillary: 98 mg/dL (ref 70–99)

## 2024-03-04 LAB — PTH-RELATED PEPTIDE: PTH-related peptide: <2 pmol/L

## 2024-03-04 NOTE — Progress Notes (Signed)
 Assessment & Plan: 73 year old status post fall downstairs with seizure activity   TBI/bilateral subdural hematoma/anterior subarachnoid hemorrhage/skull fracture - per Dr. Joshua  Loaded with Keppra  in ED, completed 1 week BID keppra , F/U CT head slightly increased SDH but exam stable. TBI therapies.  Bilateral rib fractures that were sustained in a fall on 01/21/2024, similar on follow-up CT scan - multimodal pain control, pulm toilet,  Acute hypoxic respiratory failure - self-extubated, weaned back to room air, continue flutter, chest PT, duonebs EtoH abuse and withdrawal - CIWA. MVI, thiamine , folic acid . No longer requiring dex or ativan . Tachycardia - Cards has seen and signed off. EKG. Cont metoprolol .  CKD/AKI - appreciate Nephrology F/U Hx of liver transplant (2004 and 2012) - cont prograf  FEN: S/p PEG 6/18. Nocturnal TF's.  VTE: PAS, SQH Endo: low dose LT4 ID: Keflex  for Kleb PNA UTI.  Foley: none Dispo: Eval by psych and deemed not to have capacity for decision-making regarding PEG placement and SNF placement. Palliative also engaged. No HCPOA, legal spouse identified. Per palliative note, Patients oldest son, [name] and sister, [name] have been appointed by family to make decisions. Plan for SNF. Insurance auth pending. Renal following. Appreciate recs.   Nocturnal TF's via PEG.   Await transfer to SNF - [facility] - on Monday 6/30.        Krystal Spinner, MD [facility] practice Office: [phone]        Chief Complaint: Fall  Subjective: Patient in bed, no complaints.  Nocturnal TF's well tolerated.  Objective: Vital signs in last 24 hours: Temp:  [97.9 F (36.6 C)-98.3 F (36.8 C)] 98.2 F (36.8 C) (06/29 0435) Pulse Rate:  [78-96] 91 (06/29 0815) Resp:  [16-18] 16 (06/29 0815) BP: (134-143)/(69-83) 135/79 (06/29 0815) SpO2:  [96 %-98 %] 96 % (06/29 0815) Weight:  [64.2 kg] 64.2 kg (06/29 0500) Last BM Date :  03/04/24  Intake/Output from previous day: 06/28 0701 - 06/29 0700 In: 4243.7 [P.O.:480; I.V.:770.6; NG/GT:2993.1] Out: 450 [Urine:450] Intake/Output this shift: No intake/output data recorded.  Physical Exam: HEENT - sclerae clear, mucous membranes moist Abdomen - soft, non-tender; PEG LUQ capped  Lab Results:  Recent Labs    03/02/24 0805  WBC 8.1  HGB 8.1*  HCT 26.4*  PLT 336   BMET Recent Labs    03/02/24 0805 03/03/24 0854  NA 136 142  K 4.7 4.6  CL 105 110  CO2 19* 17*  GLUCOSE 113* 108*  BUN 101* 86*  CREATININE 2.73* 2.52*  CALCIUM 10.9* 10.3   PT/INR No results for input(s): LABPROT, INR in the last 72 hours. Comprehensive Metabolic Panel:    Component Value Date/Time   NA 142 03/03/2024 0854   NA 136 03/02/2024 0805   K 4.6 03/03/2024 0854   K 4.7 03/02/2024 0805   CL 110 03/03/2024 0854   CL 105 03/02/2024 0805   CO2 17 (L) 03/03/2024 0854   CO2 19 (L) 03/02/2024 0805   BUN 86 (H) 03/03/2024 0854   BUN 101 (H) 03/02/2024 0805   CREATININE 2.52 (H) 03/03/2024 0854   CREATININE 2.73 (H) 03/02/2024 0805   GLUCOSE 108 (H) 03/03/2024 0854   GLUCOSE 113 (H) 03/02/2024 0805   CALCIUM 10.3 03/03/2024 0854   CALCIUM 10.9 (H) 03/02/2024 0805   AST 18 03/03/2024 0854   AST 15 03/02/2024 0805   ALT 20 03/03/2024 0854   ALT 20 03/02/2024 0805   ALKPHOS 100 03/03/2024 0854   ALKPHOS  96 03/02/2024 0805   BILITOT 0.8 03/03/2024 0854   BILITOT 0.2 03/02/2024 0805   PROT 6.3 (L) 03/03/2024 0854   PROT 6.7 03/02/2024 0805   ALBUMIN  2.3 (L) 03/03/2024 0854   ALBUMIN  2.3 (L) 03/02/2024 0805    Studies/Results: No results found.    Krystal Spinner 03/04/2024  Patient ID: [name], male   DOB: 1951-08-13, 73 y.o.   MRN: [id number]

## 2024-03-04 NOTE — Plan of Care (Signed)
  Problem: Education: Goal: Knowledge of General Education information will improve Description: Including pain rating scale, medication(s)/side effects and non-pharmacologic comfort measures Outcome: Progressing   Problem: Health Behavior/Discharge Planning: Goal: Ability to manage health-related needs will improve Outcome: Progressing   Problem: Nutrition: Goal: Adequate nutrition will be maintained Outcome: Progressing   Problem: Coping: Goal: Level of anxiety will decrease Outcome: Progressing   Problem: Elimination: Goal: Will not experience complications related to bowel motility Outcome: Progressing   Problem: Safety: Goal: Ability to remain free from injury will improve Outcome: Progressing

## 2024-03-04 NOTE — Progress Notes (Signed)
   Palliative Medicine Inpatient Follow Up Note HPI: 73 yo male presenting to ED 5/28 after being found seizing after falling down stairs. CTH bil SDH and SAH with trace IVH and non-depressed occipital skull fx. Intubated 5/28, self-extubated 5/30.  Palliative care has been asked to get involved to address goals of care in the setting of chronic disease, adult failure to thrive, and muscular deconditioning.   Today's Discussion 03/04/2024  *Please note that this is a verbal dictation therefore any spelling or grammatical errors are due to the Dragon Medical One system interpretation.  Chart reviewed inclusive of vital signs, progress notes, laboratory results, and diagnostic images.   I met with [name] at bedside this morning. He is not any any distress at this time. He denies pain, nausea, or shortness of breath. He and I reviewed the plan for him to go a rehabilitation. I shared this is likely to happen tomorrow.   Questions and concerns addressed.  Objective Assessment: Vital Signs Vitals:   03/04/24 0435 03/04/24 0815  BP: (!) 143/83 135/79  Pulse: 96 91  Resp: 18 16  Temp: 98.2 F (36.8 C)   SpO2: 97% 96%    Intake/Output Summary (Last 24 hours) at 03/04/2024 1103 Last data filed at 03/03/2024 1700 Gross per 24 hour  Intake 642.5 ml  Output 250 ml  Net 392.5 ml   Last Weight  Most recent update: 03/04/2024  6:57 AM    Weight  64.2 kg (141 lb 8.6 oz)            Gen:  [name] chronically ill in appearance HEENT: dry mucous membranes CV: irregular rate and regular rhythm  PULM:  On RA, breathing is even and nonlabored ABD: (+) g-tube, soft/nontender  EXT: No edema  Neuro: Oriented to self   SUMMARY OF RECOMMENDATIONS   Full Code / Full scope of care at this time   S/P PEG placement  Patients oldest son, [name] and sister, [name] have been appointed by family to make decisions --> Patients family are speaking to lawyers to create official documents    Placement to [employer] [facility]   The PMT will check in weekly _____________________________________________________________________________________[name] Palliative Medicine Team Team Cell Phone: [phone] Please utilize secure chat with additional questions, if there is no response within 30 minutes please call the above phone number  Time Spent: 25 MDM: Low  Palliative Medicine Team providers are available by phone from 7am to 7pm daily and can be reached through the team cell phone.  Should this patient require assistance outside of these hours, please call the patient's attending physician.

## 2024-03-04 NOTE — Progress Notes (Signed)
 Patient ID: [name], male   DOB: March 14, 1951, 73 y.o.   MRN: [id number] S: No new complaints O:BP 135/79 (BP Location: Right Arm)   Pulse 91   Temp 98.2 F (36.8 C) (Oral)   Resp 16   Ht 5' 8 (1.727 m)   Wt 64.2 kg   SpO2 96%   BMI 21.52 kg/m   Intake/Output Summary (Last 24 hours) at 03/04/2024 1031 Last data filed at 03/03/2024 1700 Gross per 24 hour  Intake 642.5 ml  Output 250 ml  Net 392.5 ml   Intake/Output: I/O last 3 completed shifts: In: 5431.9 [P.O.:530; I.V.:1908.8; NG/GT:2993.1] Out: 450 [Urine:450]  Intake/Output this shift:  No intake/output data recorded. Weight change: 1.2 kg Gen: NAD CVS: RRR Resp:CTA Abd: +Bs, soft, NT/nD Ext: no edema  Recent Labs  Lab 02/27/24 0658 02/28/24 0900 02/29/24 0748 03/01/24 0620 03/02/24 0805 03/03/24 0854  NA 136 135 137 138 136 142  K 5.3* 5.5* 4.5 4.7 4.7 4.6  CL 99 98 103 105 105 110  CO2 23 21* 18* 23 19* 17*  GLUCOSE 119* 124* 96 113* 113* 108*  BUN 152* 151* 139* 119* 101* 86*  CREATININE 2.86* 3.20* 3.01* 2.84* 2.73* 2.52*  ALBUMIN  2.4* 2.8* 2.3* 2.5* 2.3* 2.3*  CALCIUM 11.2* 11.5* 10.2 10.9* 10.9* 10.3  PHOS 4.4 5.2* 4.4  --   --   --   AST  --   --  18 15 15 18   ALT  --   --  22 22 20 20    Liver Function Tests: Recent Labs  Lab 03/01/24 0620 03/02/24 0805 03/03/24 0854  AST 15 15 18   ALT 22 20 20   ALKPHOS 110 96 100  BILITOT 0.7 0.2 0.8  PROT 4.6* 6.7 6.3*  ALBUMIN  2.5* 2.3* 2.3*   No results for input(s): LIPASE, AMYLASE in the last 168 hours. No results for input(s): AMMONIA in the last 168 hours. CBC: Recent Labs  Lab 02/29/24 0748 03/02/24 0805  WBC 8.1 8.1  HGB 8.0* 8.1*  HCT 26.1* 26.4*  MCV 92.9 93.6  PLT 349 336   Cardiac Enzymes: No results for input(s): CKTOTAL, CKMB, CKMBINDEX, TROPONINI in the last 168 hours. CBG: Recent Labs  Lab 03/03/24 1653 03/03/24 1929 03/04/24 0001 03/04/24 0511 03/04/24 0817  GLUCAP 85 89 93 105* 104*    Iron  Studies: No results for input(s): IRON, TIBC, TRANSFERRIN, FERRITIN in the last 72 hours. Studies/Results: No results found.  sodium chloride    Intravenous Once   acetaminophen   650 mg Oral Q6H   ascorbic acid   500 mg Oral TID   cephALEXin   500 mg Oral Q12H   docusate sodium   100 mg Oral BID   famotidine   10 mg Oral Daily   feeding supplement (NEPRO CARB STEADY)  1,000 mL Per Tube Q24H   ferrous sulfate   325 mg Oral TID WC   folic acid   1 mg Oral Daily   heparin  injection (subcutaneous)  5,000 Units Subcutaneous Q8H   levothyroxine   25 mcg Oral Q0600   liver oil-zinc  oxide   Topical BID   metoprolol  tartrate  12.5 mg Oral BID   mirtazapine   7.5 mg Oral QHS   multivitamin with minerals  1 tablet Oral Daily   mouth rinse  15 mL Mouth Rinse 4 times per day   polyethylene glycol  17 g Oral Daily   sodium bicarbonate   650 mg Oral BID   tacrolimus   1 mg Oral BID   tamsulosin   0.4  mg Oral QPC supper   thiamine   100 mg Oral Daily    BMET    Component Value Date/Time   NA 142 03/03/2024 0854   K 4.6 03/03/2024 0854   CL 110 03/03/2024 0854   CO2 17 (L) 03/03/2024 0854   GLUCOSE 108 (H) 03/03/2024 0854   BUN 86 (H) 03/03/2024 0854   CREATININE 2.52 (H) 03/03/2024 0854   CALCIUM 10.3 03/03/2024 0854   GFRNONAA 27 (L) 03/03/2024 0854   CBC    Component Value Date/Time   WBC 8.1 03/02/2024 0805   RBC 2.82 (L) 03/02/2024 0805   HGB 8.1 (L) 03/02/2024 0805   HCT 26.4 (L) 03/02/2024 0805   PLT 336 03/02/2024 0805   MCV 93.6 03/02/2024 0805   MCH 28.7 03/02/2024 0805   MCHC 30.7 03/02/2024 0805   RDW 16.2 (H) 03/02/2024 0805    Assessment/Plan:   AKI/CKD stage IIIb - BUN/Cr have markedly risen over the past week.  Poor po intake documented on I's/O's.  Will check renal US  to r/o obstruction and evaluate size and contour of kidneys.  Will check urine lytes and TAC level.  Rising calcium over the last 5 days, see below for plan.  Started IVF's NS at 100 mL/hr with  improvement of BUN/Cr.  Continue with IVF's for now.  Continues to have improved UOP and Scr.  UA with large leukocytes and mild proteinuria.  Renal US  without obstruction but with increased echogenicity.  Tacrolimus  level is low (per pt they lowered his dose due to recurrent skin cancer).  No recent contrast studies since 02/01/24.  No uremic symptoms at this time but does have a slightly decreased appetite.  Will continue to follow but is nearing his baseline SCr in the mid 2's. Was started on IVF's yesterday due to poor po intake but doing better today.  Scr was 2.52 yesterday and Scr pending for today. Avoid nephrotoxic medications including NSAIDs and iodinated intravenous contrast exposure unless the latter is absolutely indicated.   Preferred narcotic agents for pain control are hydromorphone , fentanyl , and methadone. Morphine  should not be used.  Avoid Baclofen and avoid oral sodium phosphate  and magnesium citrate based laxatives / bowel preps.  Continue strict Input and Output monitoring. Will monitor the patient closely with you and intervene or adjust therapy as indicated by changes in clinical status/labs  Hyperkalemia - due to #1.  Agree with Lokelma .  Also need to change tube feeds to Nepro due to AKI/CKD stage IIIb.  Hold prosource for now.  Improved this morning. AGMA - presumably due to #1.  Will start sodium bicarb tabs and follow. Hypercalcemia - will start IVF's as above and follow.  iPTH low at 6, negative SPEP and PTH-related peptide pending.  Will restart IVF's given persistently elevated calcium level.  Change of tube feeds as above. 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 Anemia - has not been checked for 2 weeks, will recheck CBC and iron studies H/o liver transplant - on prograf .  Will check tacrolimus  level.  Was on 1.5 mg bid up until 02/23/24 and now on 1 mg bid.   SDH/SAH/skull fracture - per trauma Bilateral rib fractures -  per trauma Etoh abuse and withdrawal - CIWA, MVI, thiamine , folic acid .  Off of ativan  and dex.  Will eventually need rehab and most likely inpatient rehab given his history. Disposition - to be discharged to SNF tomorrow.  [name] RONAL Sellar,  MD [facility]

## 2024-03-04 NOTE — Plan of Care (Signed)
 Patient ready for DC tomorrow am, it was delayed since the facility he was going to did not have the feeding pump for tonight.

## 2024-03-05 ENCOUNTER — Encounter (HOSPITAL_COMMUNITY): Payer: Self-pay | Admitting: General Surgery

## 2024-03-05 DIAGNOSIS — Z515 Encounter for palliative care: Secondary | ICD-10-CM | POA: Diagnosis not present

## 2024-03-05 DIAGNOSIS — Z7189 Other specified counseling: Secondary | ICD-10-CM | POA: Diagnosis not present

## 2024-03-05 LAB — COMPREHENSIVE METABOLIC PANEL WITH GFR
ALT: 22 U/L (ref 0–44)
AST: 19 U/L (ref 15–41)
Albumin: 2.3 g/dL — ABNORMAL LOW (ref 3.5–5.0)
Alkaline Phosphatase: 95 U/L (ref 38–126)
Anion gap: 10 (ref 5–15)
BUN: 61 mg/dL — ABNORMAL HIGH (ref 8–23)
CO2: 20 mmol/L — ABNORMAL LOW (ref 22–32)
Calcium: 10.2 mg/dL (ref 8.9–10.3)
Chloride: 109 mmol/L (ref 98–111)
Creatinine, Ser: 2.17 mg/dL — ABNORMAL HIGH (ref 0.61–1.24)
GFR, Estimated: 32 mL/min — ABNORMAL LOW (ref >60)
Glucose, Bld: 98 mg/dL (ref 70–99)
Potassium: 4.5 mmol/L (ref 3.5–5.1)
Sodium: 139 mmol/L (ref 135–145)
Total Bilirubin: 0.6 mg/dL (ref 0.0–1.2)
Total Protein: 6.2 g/dL — ABNORMAL LOW (ref 6.5–8.1)

## 2024-03-05 LAB — GLUCOSE, CAPILLARY
Glucose-Capillary: 105 mg/dL — ABNORMAL HIGH (ref 70–99)
Glucose-Capillary: 127 mg/dL — ABNORMAL HIGH (ref 70–99)
Glucose-Capillary: 112 mg/dL — ABNORMAL HIGH (ref 70–99)
Glucose-Capillary: 85 mg/dL (ref 70–99)
Glucose-Capillary: 102 mg/dL — ABNORMAL HIGH (ref 70–99)

## 2024-03-05 NOTE — Progress Notes (Signed)
   Palliative Medicine Inpatient Follow Up Note HPI: 73 yo male presenting to ED 5/28 after being found seizing after falling down stairs. CTH bil SDH and SAH with trace IVH and non-depressed occipital skull fx. Intubated 5/28, self-extubated 5/30.  Palliative care has been asked to get involved to address goals of care in the setting of chronic disease, adult failure to thrive, and muscular deconditioning.   Today's Discussion 03/05/2024  *Please note that this is a verbal dictation therefore any spelling or grammatical errors are due to the Dragon Medical One system interpretation.  Chart reviewed inclusive of vital signs, progress notes, laboratory results, and diagnostic images.   I met with [name] at bedside this morning, he is awake and alert at this time to person and place. We reviewed the plan for discharge today. He is not looking forward to being somewhere for the Fourth. I shared the goal of him transitioning to rehabilitation to gain strength and hopefull get to a point of great autonomy.  [name] shares that he does have some mild neck pain likely due to positioning. [name] denies, shortness of breath, or nausea this morning.   Questions and concerns addressed.  Objective Assessment: Vital Signs Vitals:   03/05/24 0424 03/05/24 0823  BP: 130/79 (!) 140/80  Pulse: 85 98  Resp: 18 17  Temp: 98.8 F (37.1 C) 98.2 F (36.8 C)  SpO2: 93% 94%    Intake/Output Summary (Last 24 hours) at 03/05/2024 0836 Last data filed at 03/05/2024 0820 Gross per 24 hour  Intake 4494.45 ml  Output 1300 ml  Net 3194.45 ml   Last Weight  Most recent update: 03/05/2024  6:58 AM    Weight  64.3 kg (141 lb 12.1 oz)            Gen:  [name] chronically ill in appearance HEENT: dry mucous membranes CV: irregular rate and regular rhythm  PULM:  On RA, breathing is even and nonlabored ABD: (+) g-tube, soft/nontender  EXT: No edema  Neuro: Oriented to self   SUMMARY OF  RECOMMENDATIONS   Full Code / Full scope of care at this time   S/P PEG placement  Patients oldest son, [name] and sister, [name] have been appointed by family to make decisions --> Patients family are speaking to lawyers to create official documents   Placement to [facility] today _____________________________________________________________________________________[name] Palliative Medicine Team Team Cell Phone: [phone] Please utilize secure chat with additional questions, if there is no response within 30 minutes please call the above phone number  Time Spent: 25 MDM: Low  Palliative Medicine Team providers are available by phone from 7am to 7pm daily and can be reached through the team cell phone.  Should this patient require assistance outside of these hours, please call the patient's attending physician.

## 2024-03-05 NOTE — Progress Notes (Signed)
 Assisted pt to and from restroom with stand by assist. Pt placed back in bed. Bed in lowest position. Call light in reach, bed alarm on. All needs met at this time.

## 2024-03-05 NOTE — Plan of Care (Signed)
°  Problem: Education: Goal: Knowledge of General Education information will improve Description: Including pain rating scale, medication(s)/side effects and non-pharmacologic comfort measures Outcome: Progressing   Problem: Health Behavior/Discharge Planning: Goal: Ability to manage health-related needs will improve Outcome: Progressing   Problem: Clinical Measurements: Goal: Ability to maintain clinical measurements within normal limits will improve Outcome: Progressing   Problem: Activity: Goal: Risk for activity intolerance will decrease Outcome: Progressing   Problem: Nutrition: Goal: Adequate nutrition will be maintained Outcome: Progressing   Problem: Safety: Goal: Ability to remain free from injury will improve Outcome: Progressing

## 2024-03-05 NOTE — TOC Progression Note (Addendum)
 Transition of Care [facility]) - Progression Note    Patient Details  Name: [name] MRN: [id number] Date of Birth: [birth year]  Transition of Care [facility]) CM/SW Contact  Daniyla Pfahler E Gilmar Bua, LCSW Phone Number: 03/05/2024, 9:38 AM  Clinical Narrative:    Reached out to [place] with [facility] to confirm they can take patient today - awaiting response.  11:10- Reached out to [employer] [employer] ([place] and [place]) again.   12:15- Therisa with [facility] is checking to confirm patient can come today. Patient is reportedly telling staff he can go home with his significant other Francie). Leita has continually told staff during this admission that he cannot return until he goes to short term rehab.  CSW attempted call to Leita, left a VM. CSW called patient's sister [name], she confirms Leita still says patient cannot stay with her until he gets STR. [name] states she is going to tell Leita to speak with patient directly to tell him he needs to go to short term rehab.  *Later received a VM from Leita also stating patient cannot come back to her home at this time and needs to go to short term rehab.*  1:15- Anna with [employer] [employer] states the feeding pump will not arrive until tomorrow. Confirmed with PA (MD stated this on Friday) that patient can go a few days without the pump if needed since it is not his only source of nutrition. Patient is only on nocturnal feeds per PA, and can DC without it.  1:25- Therisa states their NP will not let patient come until the feeding pump is delivered. The only way she would take him today without it is if they can borrow one from the hospital today. Therisa states she thinks shara also expires so this would have to be restarted. Escalated to [facility] Supervisor.  1:50- PA Vicci has spoken with the NP at [employer] [employer]. They have reported patient can come today without the pump as long as family is ok with it. CSW called patient's sister [name] who states she is ok with  this.   2:00- Therisa states she just found out that they do not have the contract confirmed yet with their legal.   3:00- Call to Therisa, left a VM.  3:15- Call to Therisa, she states their legal sent the contract back to someone with patient's insurance [employer] [phone]) and are waiting on him to respond.  CSW attempted call to [facility]. Left a VM.  Care Team and [facility] Supervisor updated.    Expected Discharge Plan: Skilled Nursing Facility Barriers to Discharge: Continued Medical Work up  Expected Discharge Plan and Services In-house Referral: Clinical Social Work     Living arrangements for the past 2 months: Single Family Home Expected Discharge Date: 03/05/24                                     Social Determinants of Health (SDOH) Interventions SDOH Screenings   Food Insecurity: No Food Insecurity (02/10/2024)  Housing: Low Risk  (02/10/2024)  Transportation Needs: No Transportation Needs (02/10/2024)  Utilities: Not At Risk (02/10/2024)  Social Connections: Moderately Integrated (02/10/2024)    Readmission Risk Interventions     No data to display

## 2024-03-05 NOTE — Discharge Summary (Signed)
 Physician Discharge Summary  Patient ID: [name] MRN: [id number] DOB/AGE: 1951/07/15 73 y.o.  Admit date: 02/01/2024 Discharge date: 03/05/2024  Discharge Diagnoses Fall down stairs  Bilateral SDH Anterior SAH Midline non-depressed occipital protuberance fracture tracking into posterior foramen magnum Bilateral rib fractures, sustained on fall 01/21/24 Acute hypoxic respiratory failure, resolved EtOH abuse and withdrawal Tachycardia  AKI on CKD  Consultants Neurosurgery Cardiology Psychiatry Palliative  Nephrology   Procedures PEG placement - 02/22/24 Dr. Dreama Hanger  HPI: Patient is a 73 year old male with history of recent fall with rib fractures who was found at the bottom of the stairs after another suspected fall. He was witnessed to be seizing. EMS was called and level 1 trauma activated. He was given 15 mg versed en route. On arrival GCS was 3 and he was intubated by the ED provider. No other history initially available. 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 He was admitted to the trauma ICU.   Hospital Course: Neurosurgery consulted and recommended non-operative management with follow up CTH in the morning. Follow up CTH showed slightly increased SDH but patient clinically stable so continued non-operative management. Patient self-extubated 5/30 but was tolerating well and was weaned to room air. Transferred out of ICU 5/30. Cleared for dysphagia diet by SLP on 5/30, Cortrak placed for supplemental feeds and medications. Patient with tachycardia and increased oxygen requirements 6/2, respiratory status improved with aggressive pulmonary toilet. Noted to be in Atrial fibrillation with RVR 6/3 and cardiology consulted. Patient converted back into sinus rhythm and tachycardia improved on beta blocker.  Patient worked with therapies during admission.  SLP advance patient to regular diet.  However p.o. intake was not adequate  for nutritional needs. Therapies recommended for SNF. Patient evaluated a psych and deemed not to have capacity for decision-making regarding PEG placement and SNF placement. Palliative also engaged. No HCPOA, legal spouse identified. Per palliative note, Patients oldest son, [name] and sister, [name] have been appointed by family to make decisions. Patient underwent PEG placement on 6/18. Nephrology followed during admission for acute on chronic kidney disease and hyperkalemia. He was found to have UTI w/ Ucx w/  Kleb PNA and placed on abx which he will continue at d/c. On On 03/05/24, the patient was voiding well, tolerating diet and TF's, working with therapies,  pain well controlled, vital signs stable and felt stable for discharge to SNF. Follow up as noted below.  Physical Exam: Gen - alert, NAD CV - RRR Resp - non-labored breathing on room air HEENT - sclerae clear, mucous membranes moist Abdomen - soft, non-tender; PEG LUQ capped  Allergies as of 03/05/2024       Reactions   Tape Other (See Comments)   Skin tears    Zolpidem Other (See Comments)   confusion        Medication List     PAUSE taking these medications    buPROPion 150 MG 12 hr tablet Wait to take this until your doctor or other care provider tells you to start again. Commonly known as: WELLBUTRIN SR Take 150 mg by mouth 2 (two) times daily.   traZODone 100 MG tablet Wait to take this until your doctor or other care provider tells you to start again. Commonly known as: DESYREL Take 100 mg by mouth at bedtime.       STOP taking these medications    oxyCODONE  5 MG immediate release tablet Commonly known as: Oxy IR/ROXICODONE   Stimulant Laxative 8.6-50 MG tablet Generic drug: senna-docusate   traMADol 50 MG tablet Commonly known as: ULTRAM       TAKE these medications    acetaminophen  325 MG tablet Commonly known as: TYLENOL  Take 2 tablets (650 mg total) by mouth every 6 (six) hours as needed.    ascorbic acid  500 MG tablet Commonly known as: VITAMIN C  Take 1 tablet (500 mg total) by mouth 3 (three) times daily.   cephALEXin  500 MG capsule Commonly known as: KEFLEX  Take 1 capsule (500 mg total) by mouth every 12 (twelve) hours.   docusate sodium  100 MG capsule Commonly known as: COLACE Take 1 capsule (100 mg total) by mouth 2 (two) times daily as needed for mild constipation.   famotidine  40 MG tablet Commonly known as: PEPCID  Take 40 mg by mouth at bedtime.   ferrous sulfate  325 (65 FE) MG tablet Take 1 tablet (325 mg total) by mouth 3 (three) times daily with meals.   folic acid  1 MG tablet Commonly known as: FOLVITE  Take 1 mg by mouth daily.   ipratropium-albuterol  0.5-2.5 (3) MG/3ML Soln Commonly known as: DUONEB Take 3 mLs by nebulization every 4 (four) hours as needed.   levothyroxine  25 MCG tablet Commonly known as: SYNTHROID  Take 1 tablet (25 mcg total) by mouth daily at 6 (six) AM.   liver oil-zinc  oxide 40 % ointment Commonly known as: DESITIN Apply topically 2 (two) times daily.   magnesium oxide 400 (240 Mg) MG tablet Commonly known as: MAG-OX Take 1 tablet by mouth 2 (two) times daily.   methocarbamol  500 MG tablet Commonly known as: ROBAXIN  Take 1 tablet (500 mg total) by mouth every 6 (six) hours as needed for muscle spasms. What changed:  when to take this reasons to take this   metoprolol  tartrate 25 MG tablet Commonly known as: LOPRESSOR  Take 0.5 tablets (12.5 mg total) by mouth 2 (two) times daily.   mirtazapine  15 MG disintegrating tablet Commonly known as: REMERON  SOL-TAB Take 0.5 tablets (7.5 mg total) by mouth at bedtime.   multivitamin with minerals Tabs tablet Take 1 tablet by mouth daily.   ondansetron  4 MG tablet Commonly known as: ZOFRAN  Take 4 mg by mouth every 6 (six) hours as needed.   polyethylene glycol 17 g packet Commonly known as: MIRALAX  / GLYCOLAX  Take 17 g by mouth daily as needed.   sodium bicarbonate   650 MG tablet Take 1 tablet (650 mg total) by mouth 2 (two) times daily.   tacrolimus  1 MG capsule Commonly known as: PROGRAF  Take 1 mg by mouth every 12 (twelve) hours.   tamsulosin  0.4 MG Caps capsule Commonly known as: FLOMAX  Take 0.4 mg by mouth daily after supper.   thiamine  100 MG tablet Commonly known as: Vitamin B-1 Take 1 tablet (100 mg total) by mouth daily.          Contact information for follow-up providers     Joshua Alm Hamilton, MD Follow up.   Specialty: Neurosurgery Why: As needed for questions regarding recent TBI and skull fracture Contact information: [address] [place] [address] [phone]         Billy Philippe SAUNDERS, NP. Schedule an appointment as soon as possible for a visit.   Specialty: Family Medicine Why: to establish primary care upon discharge from rehab facility Contact information: [address] [place] [address] [phone]         Pa, [facility]. Call.   Why: For  follow up Contact information: [address] [place] [address] [phone]              Contact information for after-discharge care     Destination     [facility] .   Service: Skilled Nursing Contact information: [address] [place]  [address] [phone]                     Signed: Burnard JONELLE Louder , [facility] 03/05/2024, 9:09 AM Please see Amion for pager number during day hours 7:00am-4:30pm

## 2024-03-05 NOTE — Progress Notes (Signed)
 Patient ID: [name], male   DOB: 03/08/1951, 73 y.o.   MRN: [id number]  Assessment/Plan:   AKI/CKD stage IIIb - BUN/Cr have markedly risen over the past week.  Poor po intake documented on I's/O's.  Will check renal US  to r/o obstruction and evaluate size and contour of kidneys.  Will check urine lytes and TAC level.  Rising calcium over the last 5 days, see below for plan.  Started IVF's NS at 100 mL/hr with improvement of BUN/Cr.  Continue with IVF's for now.  Continues to have improved UOP and Scr.  UA with large leukocytes and mild proteinuria.  Renal US  without obstruction but with increased echogenicity.  Tacrolimus  level is low (per pt they lowered his dose due to recurrent skin cancer).  No recent contrast studies since 02/01/24.  No uremic symptoms at this time but does have a slightly decreased appetite.    Continues to improve and SCr now 2.2 close to his BL.   Signing off at this time; please reconsult as needed. From renal standpoint ok to go to SNF. No f/u needed; he is returning to [facility] after d/c from SNF   Avoid nephrotoxic medications including NSAIDs and iodinated intravenous contrast exposure unless the latter is absolutely indicated.   Preferred narcotic agents for pain control are hydromorphone , fentanyl , and methadone. Morphine  should not be used.  Avoid Baclofen and avoid oral sodium phosphate  and magnesium citrate based laxatives / bowel preps.  Continue strict Input and Output monitoring. Will monitor the patient closely with you and intervene or adjust therapy as indicated by changes in clinical status/labs  Hyperkalemia - due to #1.  Agree with Lokelma .  Also need to change tube feeds to Nepro due to AKI/CKD stage IIIb.  Hold prosource for now.  Improved this morning -> down to 4.3. AGMA - presumably due to #1.  Start sodium bicarb tabs and follow (has diarrhea). Hypercalcemia - treated with IVF's as above and follow.  iPTH low at 6, negative SPEP and  PTH-related peptide <2.    Change of tube feeds as above. 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 Anemia - has not been checked for 2 weeks, will recheck CBC and iron studies H/o liver transplant - on prograf .  Will check tacrolimus  level.  Was on 1.5 mg bid up until 02/23/24 and now on 1 mg bid.   SDH/SAH/skull fracture - per trauma Bilateral rib fractures - per trauma Etoh abuse and withdrawal - CIWA, MVI, thiamine , folic acid .  Off of ativan  and dex.  Will eventually need rehab and most likely inpatient rehab given his history. Disposition - to be discharged to SNF soon.  S: No new complaints O:BP 130/79 (BP Location: Right Arm)   Pulse 85   Temp 98.8 F (37.1 C) (Oral)   Resp 18   Ht 5' 8 (1.727 m)   Wt 64.3 kg   SpO2 93%   BMI 21.55 kg/m   Intake/Output Summary (Last 24 hours) at 03/05/2024 0801 Last data filed at 03/05/2024 0600 Gross per 24 hour  Intake 4444.45 ml  Output 900 ml  Net 3544.45 ml   Intake/Output: I/O last 3 completed shifts: In: 4444.5 [P.O.:100; I.V.:1767.5; NG/GT:2577] Out: 900 [Urine:900]  Intake/Output this shift:  No intake/output data recorded. Weight change: 0.1 kg Gen: NAD CVS: RRR Resp:CTA Abd: +Bs, soft, NT/nD Ext: no edema  Recent Labs  Lab 02/28/24 0900 02/29/24 0748 03/01/24 0620 03/02/24 0805  03/03/24 0854 03/04/24 0938  NA 135 137 138 136 142 140  K 5.5* 4.5 4.7 4.7 4.6 4.3  CL 98 103 105 105 110 113*  CO2 21* 18* 23 19* 17* 17*  GLUCOSE 124* 96 113* 113* 108* 122*  BUN 151* 139* 119* 101* 86* 71*  CREATININE 3.20* 3.01* 2.84* 2.73* 2.52* 2.20*  ALBUMIN  2.8* 2.3* 2.5* 2.3* 2.3* 2.2*  CALCIUM 11.5* 10.2 10.9* 10.9* 10.3 10.0  PHOS 5.2* 4.4  --   --   --   --   AST  --  18 15 15 18 18   ALT  --  22 22 20 20 19    Liver Function Tests: Recent Labs  Lab 03/02/24 0805 03/03/24 0854 03/04/24 0938  AST 15 18 18   ALT 20 20 19   ALKPHOS 96 100 95  BILITOT 0.2 0.8 0.2  PROT  6.7 6.3* 5.8*  ALBUMIN  2.3* 2.3* 2.2*   No results for input(s): LIPASE, AMYLASE in the last 168 hours. No results for input(s): AMMONIA in the last 168 hours. CBC: Recent Labs  Lab 02/29/24 0748 03/02/24 0805  WBC 8.1 8.1  HGB 8.0* 8.1*  HCT 26.1* 26.4*  MCV 92.9 93.6  PLT 349 336   Cardiac Enzymes: No results for input(s): CKTOTAL, CKMB, CKMBINDEX, TROPONINI in the last 168 hours. CBG: Recent Labs  Lab 03/04/24 1243 03/04/24 1653 03/04/24 2018 03/04/24 2358 03/05/24 0423  GLUCAP 105* 89 94 98 105*    Iron Studies: No results for input(s): IRON, TIBC, TRANSFERRIN, FERRITIN in the last 72 hours. Studies/Results: No results found.  sodium chloride    Intravenous Once   acetaminophen   650 mg Oral Q6H   ascorbic acid   500 mg Oral TID   cephALEXin   500 mg Oral Q12H   docusate sodium   100 mg Oral BID   famotidine   10 mg Oral Daily   feeding supplement (NEPRO CARB STEADY)  1,000 mL Per Tube Q24H   ferrous sulfate   325 mg Oral TID WC   folic acid   1 mg Oral Daily   heparin  injection (subcutaneous)  5,000 Units Subcutaneous Q8H   levothyroxine   25 mcg Oral Q0600   liver oil-zinc  oxide   Topical BID   metoprolol  tartrate  12.5 mg Oral BID   mirtazapine   7.5 mg Oral QHS   multivitamin with minerals  1 tablet Oral Daily   mouth rinse  15 mL Mouth Rinse 4 times per day   polyethylene glycol  17 g Oral Daily   sodium bicarbonate   650 mg Oral BID   tacrolimus   1 mg Oral BID   tamsulosin   0.4 mg Oral QPC supper   thiamine   100 mg Oral Daily    BMET    Component Value Date/Time   NA 140 03/04/2024 0938   K 4.3 03/04/2024 0938   CL 113 (H) 03/04/2024 0938   CO2 17 (L) 03/04/2024 0938   GLUCOSE 122 (H) 03/04/2024 0938   BUN 71 (H) 03/04/2024 0938   CREATININE 2.20 (H) 03/04/2024 0938   CALCIUM 10.0 03/04/2024 0938   GFRNONAA 31 (L) 03/04/2024 0938   CBC    Component Value Date/Time   WBC 8.1 03/02/2024 0805   RBC 2.82 (L) 03/02/2024 0805    HGB 8.1 (L) 03/02/2024 0805   HCT 26.4 (L) 03/02/2024 0805   PLT 336 03/02/2024 0805   MCV 93.6 03/02/2024 0805   MCH 28.7 03/02/2024 0805   MCHC 30.7 03/02/2024 0805   RDW 16.2 (H) 03/02/2024 0805

## 2024-03-05 NOTE — Progress Notes (Signed)
 PT Cancellation Note  Patient Details Name: [name] MRN: [id number] DOB: 02-19-1951   Cancelled Treatment:    Reason Eval/Treat Not Completed: Patient declined, no reason specified. Pt frustrated over need to go to SNF. Awaiting d/c now. Declining participation in PT.   [name] 03/05/2024, 11:17 AM

## 2024-03-06 LAB — GLUCOSE, CAPILLARY
Glucose-Capillary: 101 mg/dL — ABNORMAL HIGH (ref 70–99)
Glucose-Capillary: 105 mg/dL — ABNORMAL HIGH (ref 70–99)
Glucose-Capillary: 109 mg/dL — ABNORMAL HIGH (ref 70–99)
Glucose-Capillary: 92 mg/dL (ref 70–99)
Glucose-Capillary: 96 mg/dL (ref 70–99)
Glucose-Capillary: 98 mg/dL (ref 70–99)

## 2024-03-06 LAB — COMPREHENSIVE METABOLIC PANEL WITH GFR
ALT: 20 U/L (ref 0–44)
AST: 20 U/L (ref 15–41)
Albumin: 2.3 g/dL — ABNORMAL LOW (ref 3.5–5.0)
Alkaline Phosphatase: 104 U/L (ref 38–126)
Anion gap: 9 (ref 5–15)
BUN: 59 mg/dL — ABNORMAL HIGH (ref 8–23)
CO2: 21 mmol/L — ABNORMAL LOW (ref 22–32)
Calcium: 10.3 mg/dL (ref 8.9–10.3)
Chloride: 109 mmol/L (ref 98–111)
Creatinine, Ser: 2.13 mg/dL — ABNORMAL HIGH (ref 0.61–1.24)
GFR, Estimated: 33 mL/min — ABNORMAL LOW (ref >60)
Glucose, Bld: 108 mg/dL — ABNORMAL HIGH (ref 70–99)
Potassium: 4.5 mmol/L (ref 3.5–5.1)
Sodium: 139 mmol/L (ref 135–145)
Total Bilirubin: 0.5 mg/dL (ref 0.0–1.2)
Total Protein: 6.1 g/dL — ABNORMAL LOW (ref 6.5–8.1)

## 2024-03-06 NOTE — TOC Progression Note (Addendum)
 Transition of Care [facility]) - Progression Note    Patient Details  Name: [name] MRN: [id number] Date of Birth: 1951-05-13  Transition of Care [facility]) CM/SW Contact  Achaia Garlock E Arla Boutwell, LCSW Phone Number: 03/06/2024, 9:35 AM  Clinical Narrative:    Florence Kung with [facility]- she is going to try to contact the insurance and legal today. Explained patient has been medically ready for several days now. Kung is not sure which date the auth expired, she states [place] is off and is the one who was working on it. Kung is going to try to figure this out.  CSW called patient's insurance provider line [phone]. Spoke with Colleen then was transferred to [facility] with Medical Management [phone]). Sueanne states she can confirm patient was approved for [employer] and the shara is good from 6/24-7/1. Sueanne states if patient does not go there today, someone from [employer] [employer] would need to call their nurse line [phone], option 2) to change the dates. Sueanne also states they can call the same number if they have any questions about the auth. Sueanne states she can see that the one time agreement was approved on 6/27 on the insurance company's end. She states Toribio Pouch is likely someone from their reimbursement team. Sueanne states reaching Toribio should not be a barrier to DC, but it may be [employer]. CSW tried to call Toribio again, left another VM. PA also left Toribio a VM yesterday.  CSW called Kung, left a VM.  Call from Leita (significant other), who states she has spoken with patient and informed him he cannot return to her home until he goes to short term rehab. She is aware of waiting for confirmation from [facility].   10:10- Kung states her boss states that they submitted a revised agreement to the insurance, and are waiting for Toribio to get back and sign off on the new agreement. She states [facility] policy is that they cannot take patient until this is  finalized. Kung states she will keep CSW updated on when this is completed. CSW provided Kung with nurse line number above.    Expected Discharge Plan: Skilled Nursing Facility Barriers to Discharge: Continued Medical Work up  Expected Discharge Plan and Services In-house Referral: Clinical Social Work     Living arrangements for the past 2 months: Single Family Home Expected Discharge Date: 03/05/24                                     Social Determinants of Health (SDOH) Interventions SDOH Screenings   Food Insecurity: No Food Insecurity (02/10/2024)  Housing: Low Risk  (02/10/2024)  Transportation Needs: No Transportation Needs (02/10/2024)  Utilities: Not At Risk (02/10/2024)  Social Connections: Moderately Integrated (02/10/2024)  Tobacco Use: Low Risk  (03/05/2024)    Readmission Risk Interventions     No data to display

## 2024-03-06 NOTE — Progress Notes (Incomplete)
 AVS in packet for transport.

## 2024-03-06 NOTE — Progress Notes (Signed)
 Report called to [facility] # [phone] spoke with nurse Angie who will be taking care of pt in room 308B when he arrives. Nurse verbalized understanding of report and had no further questions.

## 2024-03-06 NOTE — Progress Notes (Addendum)
 Progress Note  13 Days Post-Op  Subjective: Pt stable. Awaiting SNF.   Objective: Vital signs in last 24 hours: Temp:  [98.1 F (36.7 C)-98.4 F (36.9 C)] 98.1 F (36.7 C) (07/01 0820) Pulse Rate:  [86-101] 101 (07/01 0820) Resp:  [17-18] 18 (07/01 0820) BP: (133-141)/(76-84) 141/84 (07/01 0820) SpO2:  [93 %-97 %] 96 % (07/01 0820) Last BM Date : 03/05/24  Intake/Output from previous day: 06/30 0701 - 07/01 0700 In: 2482 [P.O.:490; NG/GT:1992] Out: 1650 [Urine:1650] Intake/Output this shift: Total I/O In: 240 [P.O.:240] Out: -   PE: Gen:  Alert, NAD, oriented x4 this AM Pulm: Rate and effort normal Abd: Soft, ND, NT. PEG tube in place Ext:  No LE edema Neuro:  MAE's, f/c, non-focal  Lab Results:  No results for input(s): WBC, HGB, HCT, PLT in the last 72 hours.  BMET Recent Labs    03/05/24 0649 03/06/24 0633  NA 139 139  K 4.5 4.5  CL 109 109  CO2 20* 21*  GLUCOSE 98 108*  BUN 61* 59*  CREATININE 2.17* 2.13*  CALCIUM 10.2 10.3   PT/INR No results for input(s): LABPROT, INR in the last 72 hours. CMP     Component Value Date/Time   NA 139 03/06/2024 0633   K 4.5 03/06/2024 0633   CL 109 03/06/2024 0633   CO2 21 (L) 03/06/2024 0633   GLUCOSE 108 (H) 03/06/2024 0633   BUN 59 (H) 03/06/2024 0633   CREATININE 2.13 (H) 03/06/2024 0633   CALCIUM 10.3 03/06/2024 0633   PROT 6.1 (L) 03/06/2024 0633   ALBUMIN  2.3 (L) 03/06/2024 0633   AST 20 03/06/2024 0633   ALT 20 03/06/2024 0633   ALKPHOS 104 03/06/2024 0633   BILITOT 0.5 03/06/2024 0633   GFRNONAA 33 (L) 03/06/2024 0633   Lipase  No results found for: LIPASE     Studies/Results: No results found.   Anti-infectives: Anti-infectives (From admission, onward)    Start     Dose/Rate Route Frequency Ordered Stop   03/02/24 0000  cephALEXin  (KEFLEX ) 500 MG capsule        500 mg Oral Every 12 hours 03/02/24 1154     03/01/24 1145  cephALEXin  (KEFLEX ) capsule 500 mg         500 mg Oral Every 12 hours 03/01/24 1047 03/05/24 2231        Assessment/Plan 73 year old status post fall downstairs with seizure activity   TBI/bilateral subdural hematoma/anterior subarachnoid hemorrhage/skull fracture - per Dr. Joshua  Loaded with Keppra  in ED, completed 1 week BID keppra , F/U CT head slightly increased SDH but exam stable. TBI therapies.  Bilateral rib fractures that were sustained in a fall on 01/21/2024, similar on follow-up CT scan - multimodal pain control, pulm toilet,  Acute hypoxic respiratory failure - self-extubated, weaned back to room air, continue flutter, chest PT, duonebs EtoH abuse and withdrawal - CIWA. MVI, thiamine , folic acid . No longer requiring dex or ativan . Tachycardia - Cards has seen and signed off. EKG. Cont metoprolol .  CKD/AKI - appreciate Nephrology F/U. Cont low potassium diet and nepro for TF's. Hold Prosource. Hyperkalemia 5.5 6/24, given lokelma  and nephrology consulted. They have obtained labs and renal US . Cr improving and K 4.5 Hx of liver transplant (2004 and 2012) - cont prograf . Levels pending this am FEN: S/p PEG 6/18. Nocturnal TF's. Passed for Reg diet with thin liquids with SLP. Renal diet. Ecouraged PO. Desitin around PEG tube VTE: PAS, SQH Endo: low dose LT4 ID: [id number]   for Kleb PNA UTI.  Foley: none Dispo: 4NP, therapies. Eval by psych and deemed not to have capacity for decision-making regarding PEG placement and SNF placement. Palliative also engaged. No HCPOA, legal spouse identified. Per palliative note, Patients oldest son, Koji and sister, [name] have been appointed by family to make decisions. SNF placement pending insurance    LOS: 34 days   I reviewed nursing notes, last 24 h vitals and pain scores, last 48 h intake and output, last 24 h labs and trends, and last 24 h imaging results.  This care required straight-forward level of medical decision making.    Burnard JONELLE Louder, [facility]  Surgery 03/06/2024, 9:25 AM Please see Amion for pager number during day hours 7:00am-4:30pm

## 2024-03-06 NOTE — Discharge Summary (Signed)
 Physician Discharge Summary  Patient ID: [name] MRN: [id number] DOB/AGE: 71-Jan-1952 71 y.o.  Admit date: 02/01/2024 Discharge date: 03/06/2024  Discharge Diagnoses Fall down stairs  Bilateral SDH Anterior SAH Midline non-depressed occipital protuberance fracture tracking into posterior foramen magnum Bilateral rib fractures, sustained on fall 01/21/24 Acute hypoxic respiratory failure, resolved EtOH abuse and withdrawal Tachycardia  AKI on CKD  Consultants Neurosurgery Cardiology Psychiatry Palliative  Nephrology   Procedures PEG placement - 02/22/24 Dr. Dreama Hanger  HPI: Patient is a 73 year old male with history of recent fall with rib fractures who was found at the bottom of the stairs after another suspected fall. He was witnessed to be seizing. EMS was called and level 1 trauma activated. He was given 15 mg versed en route. On arrival GCS was 3 and he was intubated by the ED provider. No other history initially available. 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 He was admitted to the trauma ICU.   Hospital Course: Neurosurgery consulted and recommended non-operative management with follow up CTH in the morning. Follow up CTH showed slightly increased SDH but patient clinically stable so continued non-operative management. Patient self-extubated 5/30 but was tolerating well and was weaned to room air. Transferred out of ICU 5/30. Cleared for dysphagia diet by SLP on 5/30, Cortrak placed for supplemental feeds and medications. Patient with tachycardia and increased oxygen requirements 6/2, respiratory status improved with aggressive pulmonary toilet. Noted to be in Atrial fibrillation with RVR 6/3 and cardiology consulted. Patient converted back into sinus rhythm and tachycardia improved on beta blocker.  Patient worked with therapies during admission.  SLP advance patient to regular diet.  However p.o. intake was not adequate  for nutritional needs. Therapies recommended for SNF. Patient evaluated a psych and deemed not to have capacity for decision-making regarding PEG placement and SNF placement. Palliative also engaged. No HCPOA, legal spouse identified. Per palliative note, Patients oldest son, [name] and sister, [name] have been appointed by family to make decisions. Patient underwent PEG placement on 6/18. Nephrology followed during admission for acute on chronic kidney disease and hyperkalemia. He was found to have UTI w/ Ucx w/  Kleb PNA and placed on abx which he will continue at d/c. On On 03/06/24, the patient was voiding well, tolerating diet and TF's, working with therapies,  pain well controlled, vital signs stable and felt stable for discharge to SNF. Follow up as noted below.   Allergies as of 03/06/2024       Reactions   Tape Other (See Comments)   Skin tears    Zolpidem Other (See Comments)   confusion        Medication List     PAUSE taking these medications    buPROPion 150 MG 12 hr tablet Wait to take this until your doctor or other care provider tells you to start again. Commonly known as: WELLBUTRIN SR Take 150 mg by mouth 2 (two) times daily.   traZODone 100 MG tablet Wait to take this until your doctor or other care provider tells you to start again. Commonly known as: DESYREL Take 100 mg by mouth at bedtime.       STOP taking these medications    oxyCODONE  5 MG immediate release tablet Commonly known as: Oxy IR/ROXICODONE    Stimulant Laxative 8.6-50 MG tablet Generic drug: senna-docusate   traMADol 50 MG tablet Commonly known as: ULTRAM       TAKE these medications  acetaminophen  325 MG tablet Commonly known as: TYLENOL  Take 2 tablets (650 mg total) by mouth every 6 (six) hours as needed.   ascorbic acid  500 MG tablet Commonly known as: VITAMIN C  Take 1 tablet (500 mg total) by mouth 3 (three) times daily.   cephALEXin  500 MG capsule Commonly known as:  KEFLEX  Take 1 capsule (500 mg total) by mouth every 12 (twelve) hours.   docusate sodium  100 MG capsule Commonly known as: COLACE Take 1 capsule (100 mg total) by mouth 2 (two) times daily as needed for mild constipation.   famotidine  40 MG tablet Commonly known as: PEPCID  Take 40 mg by mouth at bedtime.   ferrous sulfate  325 (65 FE) MG tablet Take 1 tablet (325 mg total) by mouth 3 (three) times daily with meals.   folic acid  1 MG tablet Commonly known as: FOLVITE  Take 1 mg by mouth daily.   ipratropium-albuterol  0.5-2.5 (3) MG/3ML Soln Commonly known as: DUONEB Take 3 mLs by nebulization every 4 (four) hours as needed.   levothyroxine  25 MCG tablet Commonly known as: SYNTHROID  Take 1 tablet (25 mcg total) by mouth daily at 6 (six) AM.   liver oil-zinc  oxide 40 % ointment Commonly known as: DESITIN Apply topically 2 (two) times daily.   magnesium oxide 400 (240 Mg) MG tablet Commonly known as: MAG-OX Take 1 tablet by mouth 2 (two) times daily.   methocarbamol  500 MG tablet Commonly known as: ROBAXIN  Take 1 tablet (500 mg total) by mouth every 6 (six) hours as needed for muscle spasms. What changed:  when to take this reasons to take this   metoprolol  tartrate 25 MG tablet Commonly known as: LOPRESSOR  Take 0.5 tablets (12.5 mg total) by mouth 2 (two) times daily.   mirtazapine  15 MG disintegrating tablet Commonly known as: REMERON  SOL-TAB Take 0.5 tablets (7.5 mg total) by mouth at bedtime.   multivitamin with minerals Tabs tablet Take 1 tablet by mouth daily.   ondansetron  4 MG tablet Commonly known as: ZOFRAN  Take 4 mg by mouth every 6 (six) hours as needed.   polyethylene glycol 17 g packet Commonly known as: MIRALAX  / GLYCOLAX  Take 17 g by mouth daily as needed.   sodium bicarbonate  650 MG tablet Take 1 tablet (650 mg total) by mouth 2 (two) times daily.   tacrolimus  1 MG capsule Commonly known as: PROGRAF  Take 1 mg by mouth every 12 (twelve)  hours.   tamsulosin  0.4 MG Caps capsule Commonly known as: FLOMAX  Take 0.4 mg by mouth daily after supper.   thiamine  100 MG tablet Commonly known as: Vitamin B-1 Take 1 tablet (100 mg total) by mouth daily.          Contact information for follow-up providers     Joshua Alm Hamilton, MD Follow up.   Specialty: Neurosurgery Why: As needed for questions regarding recent TBI and skull fracture Contact information: [address] [place] [address] [phone]         Billy Philippe SAUNDERS, NP. Schedule an appointment as soon as possible for a visit.   Specialty: Family Medicine Why: to establish primary care upon discharge from rehab facility Contact information: [address] [place] [address] [phone]         Pa, [facility]. Call.   Why: For follow up Contact information: [address] [place] [address] [phone]              Contact information for after-discharge care  Destination     [facility] .   Service: Skilled Nursing Contact information: [address] [place]  [address] [phone]                     Signed: Burnard JONELLE Vicci DEVONNA [facility] Surgery 03/06/2024, 11:28 AM Please see Amion for pager number during day hours 7:00am-4:30pm

## 2024-03-06 NOTE — Progress Notes (Signed)
 Physical Therapy Treatment Patient Details Name: [name] MRN: [id number] DOB: 02-Aug-1951 Today's Date: 03/06/2024   History of Present Illness 73 yo male presenting to ED 5/28 after being found seizing after falling down stairs. CTH bil SDH and SAH with trace IVH and non-depressed occipital skull fx. Intubated 5/28, self-extubated 5/30. Recently sustained bilateral rib fxs after a fall 5/17. Imaging also showed stable subacute fractures involving the right sacral ala, right superior and inferior rami, and left parasymphyseal region along with stable superior endplate compression deformities at T3, T4, and T5 since 01/21/2024. S/p EGD and PEG tube placement 6/18. PMH includes ETOH abuse s/p two liver transplants, 5/17 fall with R sacral ala superior and inferior rami fx, L superior endplate compression deformities at T3 T4 T5    PT Comments  Pt resting in bed on arrival and agreeable to session with steady progress towards acute goals. Pt initially decline AD use, needing min A to maintain balance with ambulation to/from bathroom, however agreeable to RW for support for second bout of hallway gait. Pt benefiting from cues for upright posture and increased LE clearance. Pt able to complete simple cognitive dual tasking (naming brown animals) during gait with slowing of cadence and increased time. Patient will benefit from continued inpatient follow up therapy, <3 hours/day, will continue to follow acutely.     If plan is discharge home, recommend the following: Assistance with cooking/housework;Assist for transportation;Help with stairs or ramp for entrance;Supervision due to cognitive status;A little help with walking and/or transfers;A little help with bathing/dressing/bathroom;Direct supervision/assist for medications management;Direct supervision/assist for financial management   Can travel by private vehicle     Yes  Equipment Recommendations  Rolling walker (2 wheels);BSC/3in1     Recommendations for Other Services       Precautions / Restrictions Precautions Precautions: Fall Recall of Precautions/Restrictions: Impaired Precaution/Restrictions Comments: PEG tube, abdominal binder Restrictions Weight Bearing Restrictions Per Provider Order: No Other Position/Activity Restrictions: Dr. Paola confirmed no spinal precautions or weight bearing restrictions 5/31     Mobility  Bed Mobility Overal bed mobility: Needs Assistance Bed Mobility: Supine to Sit, Sit to Supine     Supine to sit: Supervision, HOB elevated Sit to supine: Supervision   General bed mobility comments: Pt sat up on L side of bed with increased time. He brought BLE off EOB, elevated trunk, and scooted fwd til feet support without physical assist or cueing. Returned to bed and repositioned himself in the center.    Transfers Overall transfer level: Needs assistance Equipment used: Rolling walker (2 wheels), None Transfers: Sit to/from Stand Sit to Stand: Contact guard assist, Supervision           General transfer comment: CGA for safety as pt initially standing without AD support to ambulate to/form bathroom, agreeable to RW use for longer hallway gait    Ambulation/Gait Ambulation/Gait assistance: Contact guard assist, Min assist Gait Distance (Feet): 30 Feet (+ 125) Assistive device: Rolling walker (2 wheels), None Gait Pattern/deviations: Decreased stride length, Step-through pattern, Trunk flexed, Narrow base of support, Decreased dorsiflexion - right, Decreased dorsiflexion - left Gait velocity: decr     General Gait Details: pt ambulating to/from bathroom without AD support needing min A to maintain balance as pt with short shuffling steps with narrow BOS, impoved balance with RW support with cues for [employer]             Wheelchair Mobility     Tilt Bed    Modified Rankin (  Stroke Patients Only)       Balance Overall balance assessment: Needs  assistance Sitting-balance support: No upper extremity supported, Feet supported Sitting balance-Leahy Scale: Good Sitting balance - Comments: Pt sat EOB with supervision. Able to reach outside his BOS to items on tray table.   Standing balance support: Bilateral upper extremity supported, During functional activity, Reliant on assistive device for balance Standing balance-Leahy Scale: Poor Standing balance comment: Pt dependent on RW                            Communication Communication Communication: No apparent difficulties Factors Affecting Communication: Hearing impaired  Cognition Arousal: Alert Behavior During Therapy: WFL for tasks assessed/performed   PT - Cognitive impairments: No family/caregiver present to determine baseline, Awareness, Memory, Attention, Problem solving, Safety/Judgement                   Rancho Levels of Cognitive Functioning Rancho Los Amigos Scales of Cognitive Functioning: Automatic, Appropriate: Minimal Assistance for Daily Living Skills Rancho Los Amigos Scales of Cognitive Functioning: Automatic, Appropriate: Minimal Assistance for Daily Living Skills [VII] PT - Cognition Comments: Poor safety awareness requires cues throughout mobility, Following commands: Impaired Following commands impaired: Only follows one step commands consistently, Follows one step commands with increased time    Cueing Cueing Techniques: Verbal cues, Tactile cues, Visual cues  Exercises      General Comments General comments (skin integrity, edema, etc.): VSS on RA      Pertinent Vitals/Pain Pain Assessment Pain Assessment: No/denies pain Pain Intervention(s): Monitored during session    Home Living                          Prior Function            PT Goals (current goals can now be found in the care plan section) Acute Rehab PT Goals Patient Stated Goal: to go home before July 4th PT Goal Formulation: With patient Time For  Goal Achievement: 03/01/24 Progress towards PT goals: Progressing toward goals    Frequency    Min 3X/week      PT Plan      Co-evaluation              AM-PAC PT 6 Clicks Mobility   Outcome Measure  Help needed turning from your back to your side while in a flat bed without using bedrails?: A Little Help needed moving from lying on your back to sitting on the side of a flat bed without using bedrails?: A Little Help needed moving to and from a bed to a chair (including a wheelchair)?: A Little Help needed standing up from a chair using your arms (e.g., wheelchair or bedside chair)?: A Little Help needed to walk in hospital room?: A Little Help needed climbing 3-5 steps with a railing? : A Lot 6 Click Score: 17    End of Session   Activity Tolerance: Patient tolerated treatment well Patient left: in bed;with call bell/phone within reach;with bed alarm set Nurse Communication: Mobility status PT Visit Diagnosis: Unsteadiness on feet (R26.81);Muscle weakness (generalized) (M62.81);Other symptoms and signs involving the nervous system (R29.898);Other abnormalities of gait and mobility (R26.89);Difficulty in walking, not elsewhere classified (R26.2)     Time: 8967-8898 PT Time Calculation (min) (ACUTE ONLY): 29 min  Charges:    $Gait Training: 8-22 mins $Therapeutic Activity: 8-22 mins PT General Charges $$ ACUTE PT VISIT: 1  Visit                     Therisa SAUNDERS. PTA Acute Rehabilitation Services Office: [phone]   Therisa CHRISTELLA Boor 03/06/2024, 12:47 PM

## 2024-03-06 NOTE — TOC Transition Note (Addendum)
 Transition of Care [facility]) - Discharge Note   Patient Details  Name: [name] MRN: [id number] Date of Birth: Oct 03, 1950  [facility] CM/SW Contact:  Laylana Gerwig E Dazani Norby, LCSW Phone Number: 03/06/2024, 12:03 PM   Clinical Narrative:    Discharge to [facility] today. Room 308B. Confirmed with Admissions Worker Therisa. She states they will be ready for him after 4pm. Updated MD, RN, and patient's sister [name] and significant other Leita.  Asked RN to call report. Patient added to list for [facility] for 4pm pick up (may be later pending truck availability)    Final next level of care: Skilled Nursing Facility Barriers to Discharge: Barriers Resolved   Patient Goals and CMS Choice   CMS Medicare.gov Compare Post Acute Care list provided to:: Patient Represenative (must comment) Choice offered to / list presented to : Sibling      Discharge Placement              Patient chooses bed at: [facility] Patient to be transferred to facility by: [facility] Name of family member notified: [name] - sister Patient and family notified of of transfer: 03/06/24  Discharge Plan and Services Additional resources added to the After Visit Summary for   In-house Referral: Clinical Social Work                                   Social Drivers of Health (SDOH) Interventions SDOH Screenings   Food Insecurity: No Food Insecurity (02/10/2024)  Housing: Low Risk  (02/10/2024)  Transportation Needs: No Transportation Needs (02/10/2024)  Utilities: Not At Risk (02/10/2024)  Social Connections: Moderately Integrated (02/10/2024)  Tobacco Use: Low Risk  (03/05/2024)     Readmission Risk Interventions     No data to display

## 2024-03-07 ENCOUNTER — Encounter (HOSPITAL_COMMUNITY): Payer: Self-pay

## 2024-03-15 ENCOUNTER — Emergency Department
Admission: EM | Admit: 2024-03-15 | Discharge: 2024-03-16 | Disposition: A | Discharge status: Home / Self Care | Payer: Medicare (Managed Care) | Attending: Emergency Medicine | Admitting: Emergency Medicine

## 2024-03-15 ENCOUNTER — Other Ambulatory Visit: Payer: Self-pay

## 2024-03-15 DIAGNOSIS — E875 Hyperkalemia: Secondary | ICD-10-CM | POA: Insufficient documentation

## 2024-03-15 DIAGNOSIS — N179 Acute kidney failure, unspecified: Secondary | ICD-10-CM | POA: Insufficient documentation

## 2024-03-15 DIAGNOSIS — N189 Chronic kidney disease, unspecified: Secondary | ICD-10-CM | POA: Insufficient documentation

## 2024-03-15 LAB — COMPREHENSIVE METABOLIC PANEL WITH GFR
ALT: 16 U/L (ref 0–44)
AST: 16 U/L (ref 15–41)
Albumin: 2.8 g/dL — ABNORMAL LOW (ref 3.5–5.0)
Alkaline Phosphatase: 82 U/L (ref 38–126)
Anion gap: 12 (ref 5–15)
BUN: 49 mg/dL — ABNORMAL HIGH (ref 8–23)
CO2: 23 mmol/L (ref 22–32)
Calcium: 9.4 mg/dL (ref 8.9–10.3)
Chloride: 103 mmol/L (ref 98–111)
Creatinine, Ser: 2.61 mg/dL — ABNORMAL HIGH (ref 0.61–1.24)
GFR, Estimated: 26 mL/min — ABNORMAL LOW (ref >60)
Glucose, Bld: 101 mg/dL — ABNORMAL HIGH (ref 70–99)
Potassium: 5.6 mmol/L — ABNORMAL HIGH (ref 3.5–5.1)
Sodium: 138 mmol/L (ref 135–145)
Total Bilirubin: 0.4 mg/dL (ref 0.0–1.2)
Total Protein: 6.8 g/dL (ref 6.5–8.1)

## 2024-03-15 LAB — CBC WITH DIFFERENTIAL/PLATELET
Abs Immature Granulocytes: 0.02 K/uL (ref 0.00–0.07)
Basophils Absolute: 0 K/uL (ref 0.0–0.1)
Basophils Relative: 0 %
Eosinophils Absolute: 0.4 K/uL (ref 0.0–0.5)
Eosinophils Relative: 10 %
HCT: 25.1 % — ABNORMAL LOW (ref 39.0–52.0)
Hemoglobin: 7.7 g/dL — ABNORMAL LOW (ref 13.0–17.0)
Immature Granulocytes: 1 %
Lymphocytes Relative: 25 %
Lymphs Abs: 1.1 K/uL (ref 0.7–4.0)
MCH: 28.6 pg (ref 26.0–34.0)
MCHC: 30.7 g/dL (ref 30.0–36.0)
MCV: 93.3 fL (ref 80.0–100.0)
Monocytes Absolute: 0.5 K/uL (ref 0.1–1.0)
Monocytes Relative: 12 %
Neutro Abs: 2.3 K/uL (ref 1.7–7.7)
Neutrophils Relative %: 52 %
Platelets: 370 K/uL (ref 150–400)
RBC: 2.69 MIL/uL — ABNORMAL LOW (ref 4.22–5.81)
RDW: 16.6 % — ABNORMAL HIGH (ref 11.5–15.5)
WBC: 4.3 K/uL (ref 4.0–10.5)
nRBC: 0 % (ref 0.0–0.2)

## 2024-03-15 MED ORDER — SODIUM ZIRCONIUM CYCLOSILICATE 10 G PO PACK
10.0000 g | PACK | Freq: Once | ORAL | Status: AC
  Administered 2024-03-15: 10 g via ORAL
  Filled 2024-03-15: qty 1

## 2024-03-15 MED ORDER — LOKELMA 10 G PO PACK
10.0000 g | PACK | Freq: Every day | ORAL | 0 refills | Status: AC
Start: 2024-03-15 — End: 2024-04-14

## 2024-03-15 NOTE — ED Triage Notes (Signed)
 Pt brought in by EMS from [facility] for hyperkalemia.  Pt is asymptomatic, but was sent in by facility for a potassium level 6.2.  Pt AAOx4, VSS, no complaints

## 2024-03-15 NOTE — ED Provider Notes (Signed)
 [facility] Provider Note    Event Date/Time   First MD Initiated Contact with Patient 03/15/24 2124     (approximate)   History   Abnormal Lab (Hyperkalemia)   HPI  [name] is a 73 y.o. male with history of liver transplant on tacrolimus , CKD, presenting with hyperkalemia.  Per EMS patient had lab results done during routine labs that found to be a K of 6.2.  Patient states that he has no symptoms, is voiding as per usual, no new falls.  States that he would not be here if not for the K.  Independent history obtained from EMS as above.   On independent chart review, he was discharge in early July after he fell down some stairs, had bilateral subdural hemorrhages as well as subarachnoid hemorrhages and rib fractures.    Physical Exam   Triage Vital Signs: ED Triage Vitals  Encounter Vitals Group     BP      Girls Systolic BP Percentile      Girls Diastolic BP Percentile      Boys Systolic BP Percentile      Boys Diastolic BP Percentile      Pulse      Resp      Temp      Temp src      SpO2      Weight      Height      Head Circumference      Peak Flow      Pain Score      Pain Loc      Pain Education      Exclude from Growth Chart     Most recent vital signs: Vitals:   03/15/24 2132 03/15/24 2134  BP:  (!) 148/88  Temp: 97.7 F (36.5 C) 97.7 F (36.5 C)  SpO2:  100%     General: Awake, no distress.  CV:  Good peripheral perfusion.  Resp:  Normal effort.  No tachypnea or respiratory distress Abd:  No distention.  Soft nontender Other:  Ambulatory, nontoxic appearing   ED Results / Procedures / Treatments   Labs (all labs ordered are listed, but only abnormal results are displayed) Labs Reviewed  COMPREHENSIVE METABOLIC PANEL WITH GFR - Abnormal; Notable for the following components:      Result Value   Potassium 5.6 (*)    Glucose, Bld 101 (*)    BUN 49 (*)    Creatinine, Ser 2.61 (*)    Albumin 2.8 (*)     GFR, Estimated 26 (*)    All other components within normal limits  CBC WITH DIFFERENTIAL/PLATELET - Abnormal; Notable for the following components:   RBC 2.69 (*)    Hemoglobin 7.7 (*)    HCT 25.1 (*)    RDW 16.6 (*)    All other components within normal limits     EKG  EKG shows, EKG shows sinus rhythm, reassigning for, normal QRS, normal QTc, T waves are mildly peaked, no obvious ischemic ST elevation, these T waves are not changed compared to prior and this is in comparison to EKGs are obtained when his K was normal.   PROCEDURES:  Critical Care performed: No  Procedures   MEDICATIONS ORDERED IN ED: Medications  sodium zirconium cyclosilicate  (LOKELMA ) packet 10 g (10 g Oral Given 03/15/24 2239)     IMPRESSION / MDM / ASSESSMENT AND PLAN / ED COURSE  I reviewed the triage vital signs and the  nursing notes.                              Differential diagnosis includes, but is not limited to, lab error, electrolyte derangements, worsening AKI on CKD.  Will get labs, EKG.  Patient's presentation is most consistent with acute presentation with potential threat to life or bodily function.  Independent interpretation of labs below.  Given a dose of Lokelma  here.  Discussed with patient about possible admission but he does not want to stay, consultation with nephrology as below.  I believe that patient is safe for outpatient management given that he is followed by nephrology outpatient, patient states that he his appointment might be early next week.  Did discuss with him about contacting his nephrologist or his primary care doctor to get labs done by Monday to make sure that his potassium is downtrending.  Will give him a prescription for Lokelma  that he can take daily.  Discussed with him about keeping himself hydrated and to avoid foods that are high in potassium.  He is agreeable with this plan.  Will discharge with strict return precautions.    Clinical Course as of 03/15/24  2304  Thu Mar 15, 2024  2219 Independent review of labs, his potassium is mildly elevated compared to prior, but not as high as labs obtained from [facility] of 6.2, also mild AKI, LFTs are normal.  No leukocytosis. [TT]  2230 Discussed with patient about lab results, also did bedside ultrasound that did not show an enlarged bladder.  Doubt this is postobstructive.  Consulted nephrology who agreed with the Lokelma , no need for admission at this time, no additional medications necessary, wanted to have him follow-up with his nephrologist early next week, and for patient to give his nephrologist a call tomorrow morning to set up follow-up.  Patient also states that he does not want to stay. [TT]    Clinical Course User Index [TT] Waymond, Lorelle Cummins, MD     FINAL CLINICAL IMPRESSION(S) / ED DIAGNOSES   Final diagnoses:  Hyperkalemia  AKI (acute kidney injury) (HCC)     Rx / DC Orders   ED Discharge Orders          Ordered    sodium zirconium cyclosilicate  (LOKELMA ) 10 g PACK packet  Daily        03/15/24 2232             Note:  This document was prepared using Dragon voice recognition software and may include unintentional dictation errors.    Waymond Lorelle Cummins, MD 03/15/24 [phone]

## 2024-03-15 NOTE — Discharge Instructions (Signed)
 Please make sure to keep yourself hydrated, avoid any foods that will cause high potassium like bananas.    Please take the Lokelma  daily as prescribed.    Please discuss with your primary care doctor or nephrologist tomorrow to get repeat labs done to reassess your potassium levels by Monday.    Please also call your nephrologist office tomorrow morning to make sure that you have an appointment early next week.

## 2024-03-19 NOTE — Progress Notes (Signed)
 Patient has CKD stage IV based on laboratory studies

## 2024-04-09 ENCOUNTER — Ambulatory Visit: Payer: Self-pay

## 2024-04-09 NOTE — Telephone Encounter (Signed)
 FYI Only or Action Required?: FYI only for provider.  Patient to be new patient with [facility]  Called Nurse Triage reporting Hospitalization Follow-up, Altered Mental Status, still have the tube in me from hospital 6/18, surgical complication, and tubing complications. - limited info available  Symptoms began per pt chart PEG tube placed 6/18 in hospital admission.  Interventions attempted: Other: pt unsure why PEG tube still there, been to ED for other reason on 7/10.  Symptoms are: critical, limited info on current condition.  Triage Disposition: Go to ED Now (Notify PCP)  Patient/caregiver understands and will follow disposition?: Yes     Agent reporting that pt still has tubing in, agent did not know how to categorize call, so nurse searched MRN and created triage call.  Reason for Disposition  Nursing judgment or information in reference  Answer Assessment - Initial Assessment Questions Pt is poor historian  1. REASON FOR CALL: What is your main concern right now?     Still have tubing in from while ago Didn't see any scars or anything like they've operated on me  Tubes in stomach, 2 foot long hose coming out of it, hard hiding it, really aggravating me now, not used it since left the hospital Skin not bad, little bit of junk that's come out of it, reddish junk, looks like dried up blood My estimation is sometime around 7/3 Nothing goes in or comes out of it No fever or chills They checked me out at [facility] late last week  From [place], down here visiting somebody  They said they thought I was underweight, so got tube, was really depressed Didn't say nothing about next steps, how long it would be there, they cleaned it up one time Eating normally Was in hospital for 6-8 days  No one with him at present  3. SEVERITY: How bad is the issue?     No not really in pain Got a binder around it, gets aggravating  8. TREATMENTS AND  RESPONSE: What have you done so far to try to make this better? What medicines have you used?     Pt not been told about next steps for it  Asking for call back after nurse has gathered more info from chart Advised ED in meantime for removal of tubing as appropriate, pt wanting to avoid waiting in ED, wants appt if possible   Called pt back, no answer, LVM for call back Attempted call pt again, completed.   Speaking to pt:  When nurse asked where pt is currently, if in rehab facility or at a house, pt responded Still have the tube in me if that's what you're asking In [place] close to [employer], at a friend's house  Advised pt go to ED, have another adult bring him there Per pt's chart Patients oldest son, Waylon and sister, [name] have been appointed by family to make decisions. Patient underwent PEG placement on 6/18. Got permission from pt to talk to sister and son then call him back  Called pt sister [name], sister reporting: Really don't know how he's doing, only talked to him one time Went to skilled nursing then released from there, surprised they didn't take that tube out Lives with girlfriend in [place], not sure if that's where he's at Living down there for last 30 years, would come up every once in while to see docs in [place] so PCP is in [place] Needs to get doc down there Can't drive Probably know more  about it than son Larnell He hasn't been listening to me He hasn't been in touch with me last few weeks since told him assisted living may be best Not sure where he is, either Laura's house or friend's house Keeps falling, doesn't want to go to assisted living Asked sister about any contacts from hospital admission/specialists, none that she has Agrees pt should go to hospital to get tubing out   Called pt back Advised that sister didn't have info about next steps with tubing either Advised pt that he go to ED asap for exam and removal of tubing as  appropriate, pt does not want to wait in ED so may wait for a random time of day for less of a wait like 5 am, advised strongly that pt go right away, have another adult drive him Girlfriend can bring me there Confirmed he wanted to establish care with PCP in [place] Scheduled new patient appt 8/21 with [place] office, placed on waitlist, gave address and phone number for office Reminded pt to go to ED right away, let us  know how hospital goes, pt states he will    Advised pt go to ED for immediate care and to determine follow up care, scheduled new patient appt with [place] office for 8/21, placed on waitlist. Advised call back if need anything or give update. No further questions or concerns at this time.  Protocols used: No Guideline Available-A-AH

## 2024-04-23 DIAGNOSIS — Z7689 Persons encountering health services in other specified circumstances: Secondary | ICD-10-CM | POA: Insufficient documentation

## 2024-04-23 DIAGNOSIS — M810 Age-related osteoporosis without current pathological fracture: Secondary | ICD-10-CM | POA: Insufficient documentation

## 2024-04-23 NOTE — Progress Notes (Deleted)
 No chief complaint on file.      New Patient Visit SUBJECTIVE: HPI: [name] is an 73 y.o.male who is being seen for establishing care.  The patient was previously seen at ***.  Employment: SH: Married***   Children****  PMHx- ETOH abuse post liver transplant, Disorder Magnesium  metabolism, Hepatic Encephalopathy, HTN, Thrombocytopenia, acute renal failure R/T CKD stage III, IDA,  Patient was recently hospitalized July 2025 for hyperkalemia and AKI. He received a blood transfusion.   Psx-history of liver transplant, PEG placement 02/2024  Patient Care Team: System, Provider Not In as PCP - General james c dewar as Consulting Physician (Gastroenterology) harlene ferrari as Case Manager Pcp, No   Follows with Endocrinology, Transplant, Derm, NeuroSx  Hx liver transplant (1st 04/2003, 2nd 5/ 2012)-follows with [facility] Tacrolimus   HTN Amlodipine  5 mg daily,  metoprolol  50 mg twice daily???;  Metoprolol  12.5 mg twice daily Hydrochlorothiazide???  Anxiety/depression Bupropion  (Wellbutrin ) 150 mg daily Remeron  7.5 mg at bedtime  GERD-Pepcid  40 mg daily  BPH-tamsulosin  (Flomax ) 0.4 mg daily  Hx EtOH abuse- thiamine  and folate daily  Hypothyroidism-Synthroid  25 mcg daily  Insomnia-trazodone  100 mg at bedtime  Chronic Low Back Pain-followed by neurosurgery Robaxin  500 mg every 6 hours as needed Hx compression fracture. Not surgical candidat recently referred to Dr. Here, per neurosurgery last note he is not a good surgical candidate however they are considering further imaging. Neruro Sx reccomends use of OTC brace.  HCM -Colonoscopy: 2019 was normal, repeat 2029. -Skin cancer screening: he is DUE now ASAP. 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 -DEXA scan: 2025 showed osteoporosis. -HAV/HBV Immunizations: Immune to hepatitis A and hepatitis B series has been completed, pt is non responder. Immunizaions due: PNA, Influenza,  Covid-19  Osteoporosis per DEXA  Patient denies fever, chills, SOB, CP, palpitations, dyspnea, edema, HA, vision changes, N/V/D, abdominal pain, urinary symptoms, rash, weight changes, and recent illness or hospitalizations.   Past Medical History:  Diagnosis Date   ALCOHOL  ABUSE, HX OF    ANEMIA, IRON DEFICIENCY    DERMATITIS    Hepatic encephalopathy (HCC)    HEPATIC FAILURE, END STAGE 2004   liver transplant 2004; 2nd orthotopic liver transplant 01/25/11 [facility] [phone] dr. lynwood dewar   Hypertension    THROMBOCYTOPENIA    Past Surgical History:  Procedure Laterality Date   (L) arm broke     Broke (L) ankle   2001   ESOPHAGOGASTRODUODENOSCOPY N/A 02/22/2024   Procedure: EGD (ESOPHAGOGASTRODUODENOSCOPY);  Surgeon: Paola Dreama SAILOR, MD;  Location[facility] ENDOSCOPY;  Service: General;  Laterality: N/A;   HERNIA REPAIR     LIVER TRANSPLANT  1st 04/21/03; 2nd 01/25/11   @ [facility] dr. dewar [phone];  [phone]   LIVER TRANSPLANT     PEG PLACEMENT N/A 02/22/2024   Procedure: INSERTION, PEG TUBE;  Surgeon: Paola Dreama SAILOR, MD;  Location: [facility] ENDOSCOPY;  Service: General;  Laterality: N/A;   Family History  Problem Relation Age of Onset   Arthritis Mother    Hypertension Mother    Lung cancer Father    Heart disease Father    Cancer Father        lung cancer   Hypertension Sister    Allergies  Allergen Reactions   Tape Other (See Comments)    SKIN TEARS VERY EASILY!!!!! Can tolerate ONLY paper tape.   Zolpidem Other (See Comments)    Confusion   Moxifloxacin Rash and Other (See Comments)    Achilles  tendon pain also   Tape Other (See Comments)    Skin tears    Zolpidem Other (See Comments)    confusion    Current Outpatient Medications:    acetaminophen  (TYLENOL ) 325 MG tablet, Take 2 tablets (650 mg total) by mouth every 6 (six) hours as needed., Disp: , Rfl:    amLODipine  (NORVASC ) 5 MG tablet, Take 5 mg by mouth daily., Disp: , Rfl:    Ascorbic  Acid (VITAMIN C ) 500 MG tablet, Take 500 mg by mouth daily.  , Disp: , Rfl:    ascorbic acid  (VITAMIN C ) 500 MG tablet, Take 1 tablet (500 mg total) by mouth 3 (three) times daily., Disp: , Rfl:    buPROPion  (WELLBUTRIN  SR) 150 MG 12 hr tablet, Take 1 tablet (150 mg total) by mouth daily. (Patient taking differently: Take 150 mg by mouth 2 (two) times daily.), Disp: , Rfl:    [Paused] buPROPion  (WELLBUTRIN  SR) 150 MG 12 hr tablet, Take 150 mg by mouth 2 (two) times daily., Disp: , Rfl:    Calcium-Magnesium -Vitamin D (CITRACAL CALCIUM+D PO), Take 1 tablet by mouth daily., Disp: , Rfl:    cephALEXin  (KEFLEX ) 500 MG capsule, Take 1 capsule (500 mg total) by mouth every 12 (twelve) hours., Disp: 7 capsule, Rfl:    docusate sodium  (COLACE) 100 MG capsule, Take 1 capsule (100 mg total) by mouth 2 (two) times daily as needed for mild constipation., Disp: , Rfl:    famotidine  (PEPCID ) 40 MG tablet, Take 40 mg by mouth at bedtime., Disp: , Rfl:    famotidine  (PEPCID ) 40 MG tablet, Take 40 mg by mouth at bedtime., Disp: , Rfl:    feeding supplement (ENSURE ENLIVE / ENSURE PLUS) LIQD, Take 237 mLs by mouth 2 (two) times daily between meals. (Patient taking differently: Take 237 mLs by mouth as needed (Meal replacement).), Disp: 237 mL, Rfl: 12   ferrous sulfate  325 (65 FE) MG tablet, Take 1 tablet (325 mg total) by mouth 3 (three) times daily with meals., Disp: , Rfl:    folic acid  (FOLVITE ) 1 MG tablet, Take 1 tablet (1 mg total) by mouth daily., Disp: 30 tablet, Rfl: 0   folic acid  (FOLVITE ) 1 MG tablet, Take 1 mg by mouth daily., Disp: , Rfl:    ipratropium-albuterol  (DUONEB) 0.5-2.5 (3) MG/3ML SOLN, Take 3 mLs by nebulization every 4 (four) hours as needed., Disp: , Rfl:    levothyroxine  (SYNTHROID ) 25 MCG tablet, Take 1 tablet (25 mcg total) by mouth daily at 6 (six) AM., Disp: , Rfl:    liver oil-zinc  oxide (DESITIN) 40 % ointment, Apply topically 2 (two) times daily., Disp: , Rfl:    magnesium  oxide  (MAG-OX) 400 (240 Mg) MG tablet, Take 1 tablet by mouth 2 (two) times daily., Disp: , Rfl:    Magnesium  Oxide 400 MG CAPS, Take 400 mg by mouth daily as needed (cramps)., Disp: , Rfl:    methocarbamol  (ROBAXIN ) 500 MG tablet, Take 1 tablet (500 mg total) by mouth every 8 (eight) hours as needed for muscle spasms., Disp: 30 tablet, Rfl: 0   methocarbamol  (ROBAXIN ) 500 MG tablet, Take 1 tablet (500 mg total) by mouth every 6 (six) hours as needed for muscle spasms., Disp: , Rfl:    metoprolol  (LOPRESSOR ) 50 MG tablet, Take 1 tablet (50 mg total) by mouth 2 (two) times daily., Disp: 60 tablet, Rfl: 11   metoprolol  tartrate (LOPRESSOR ) 25 MG tablet, Take 0.5 tablets (12.5 mg total) by mouth 2 (two)  times daily., Disp: , Rfl:    mirtazapine  (REMERON  SOL-TAB) 15 MG disintegrating tablet, Take 0.5 tablets (7.5 mg total) by mouth at bedtime., Disp: , Rfl:    Multiple Vitamin (MULTIVITAMIN WITH MINERALS) TABS tablet, Take 1 tablet by mouth daily., Disp: , Rfl:    Multiple Vitamin (MULTIVITAMIN) tablet, Take 1 tablet by mouth daily with breakfast., Disp: , Rfl:    ondansetron  (ZOFRAN ) 4 MG tablet, Take 1 tablet (4 mg total) by mouth every 6 (six) hours as needed for nausea., Disp: 20 tablet, Rfl: 0   ondansetron  (ZOFRAN ) 4 MG tablet, Take 4 mg by mouth every 6 (six) hours as needed., Disp: , Rfl:    pantoprazole  (PROTONIX ) 40 MG tablet, Take 1 tablet (40 mg total) by mouth daily., Disp: 90 tablet, Rfl: 0   polyethylene glycol (MIRALAX  / GLYCOLAX ) 17 g packet, Take 17 g by mouth daily., Disp: 14 each, Rfl: 0   polyethylene glycol (MIRALAX  / GLYCOLAX ) 17 g packet, Take 17 g by mouth daily as needed., Disp: , Rfl:    senna-docusate (SENOKOT-S) 8.6-50 MG tablet, Take 1 tablet by mouth at bedtime., Disp: 30 tablet, Rfl: 0   sodium bicarbonate  650 MG tablet, Take 2 tablets (1,300 mg total) by mouth 2 (two) times daily. (Patient taking differently: Take 650 mg by mouth 2 (two) times daily.), Disp: 120 tablet, Rfl: 0    sodium bicarbonate  650 MG tablet, Take 1 tablet (650 mg total) by mouth 2 (two) times daily., Disp: , Rfl:    tacrolimus  (PROGRAF ) 1 MG capsule, Take 1 mg by mouth in the morning and at bedtime., Disp: , Rfl:    tacrolimus  (PROGRAF ) 1 MG capsule, Take 1 mg by mouth every 12 (twelve) hours., Disp: , Rfl:    tamsulosin  (FLOMAX ) 0.4 MG CAPS capsule, Take 0.4 mg by mouth daily., Disp: , Rfl:    tamsulosin  (FLOMAX ) 0.4 MG CAPS capsule, Take 0.4 mg by mouth daily after supper., Disp: , Rfl:    thiamine  (VITAMIN B-1) 100 MG tablet, Take 1 tablet (100 mg total) by mouth daily., Disp: 30 tablet, Rfl: 0   thiamine  (VITAMIN B-1) 100 MG tablet, Take 1 tablet (100 mg total) by mouth daily., Disp: , Rfl:    traZODone  (DESYREL ) 100 MG tablet, Take 100 mg by mouth at bedtime as needed for sleep., Disp: , Rfl:    [Paused] traZODone  (DESYREL ) 100 MG tablet, Take 100 mg by mouth at bedtime., Disp: , Rfl:   PHQ9 Today:     No data to display         GAD7 Today:     No data to display          OBJECTIVE: There were no vitals taken for this visit. General:  well developed, well nourished, in no apparent distress Skin:  no significant moles, warts, or growths Nose:  nares patent, septum midline, mucosa normal Throat/Pharynx:  lips and gingiva without lesion; tongue and uvula midline; non-inflamed pharynx; no exudates or postnasal drainage Lungs:  clear to auscultation, breath sounds equal bilaterally, no respiratory distress Cardio:  regular rate and rhythm, no LE edema or bruits Musculoskeletal:  symmetrical muscle groups noted without atrophy or deformity Neuro:  gait normal Psych: well oriented with normal range of affect and appropriate judgment/insight  ASSESSMENT/PLAN: No diagnosis found.  Patient instructed to sign release of records form from their previous PCP. The patient voiced understanding and agreement to the plan. Education provided today during visit and on AVS for patient to  review at home.  Diet and Exercise recommendations provided.  Current diagnoses and recommendations discussed. HM recommendations reviewed with recommendations.   Patient should return ***. No follow-ups on file.   Harlene LITTIE Jolly, DNP, AGNP-C 04/23/24  2:45 PM

## 2024-04-25 ENCOUNTER — Emergency Department (HOSPITAL_COMMUNITY)
Admission: EM | Admit: 2024-04-25 | Discharge: 2024-04-25 | Disposition: A | Discharge status: Home / Self Care | Payer: Medicare (Managed Care) | Attending: Emergency Medicine | Admitting: Emergency Medicine

## 2024-04-25 ENCOUNTER — Encounter (HOSPITAL_COMMUNITY): Payer: Self-pay

## 2024-04-25 ENCOUNTER — Other Ambulatory Visit: Payer: Self-pay

## 2024-04-25 DIAGNOSIS — I10 Essential (primary) hypertension: Secondary | ICD-10-CM | POA: Diagnosis not present

## 2024-04-25 DIAGNOSIS — K9422 Gastrostomy infection: Secondary | ICD-10-CM | POA: Diagnosis present

## 2024-04-25 DIAGNOSIS — K9429 Other complications of gastrostomy: Secondary | ICD-10-CM

## 2024-04-25 DIAGNOSIS — Z79899 Other long term (current) drug therapy: Secondary | ICD-10-CM | POA: Insufficient documentation

## 2024-04-25 NOTE — ED Provider Notes (Signed)
 [place] [facility] AT [facility] Provider Note   CSN: [id number] Arrival date & time: 04/25/24  9493     Patient presents with: Abdominal Pain   [name] is a 73 y.o. male.   HPI     This is a 73 year old male who presents requesting to have his PEG tube removed.  Patient was admitted to the trauma service for approximately 1 month.  He developed subdural hematomas and rib fractures after a fall.  He ultimately had a PEG tube placed in mid June.  He discharged to rehab.  He states that during rehab, he was given nutrition to the tube intermittently.  However he has been taking oral intake and has not used his tube in over 2 weeks.  States that he would like to have it removed.  He was discharged from rehab home.  During his hospitalization and acute illness, he was deemed to not have capacity to make decisions for himself.  He is currently awake alert, oriented.  He does not seem to have good insight into why the PEG tube was placed.  He has not had any fevers.  Has noted some leakage around the PEG tube.  Prior to Admission medications   Medication Sig Start Date End Date Taking? Authorizing Provider  acetaminophen  (TYLENOL ) 325 MG tablet Take 2 tablets (650 mg total) by mouth every 6 (six) hours as needed. 03/02/24   Maczis, Michael M, PA-C  amLODipine  (NORVASC ) 5 MG tablet Take 5 mg by mouth daily.    [provider]  Ascorbic Acid  (VITAMIN C ) 500 MG tablet Take 500 mg by mouth daily.      [provider]  ascorbic acid  (VITAMIN C ) 500 MG tablet Take 1 tablet (500 mg total) by mouth 3 (three) times daily. 03/02/24   Maczis, Michael M, PA-C  buPROPion  (WELLBUTRIN  SR) 150 MG 12 hr tablet Take 1 tablet (150 mg total) by mouth daily. Patient taking differently: Take 150 mg by mouth 2 (two) times daily. 01/27/23   Arlice Reichert, MD  buPROPion  (WELLBUTRIN  SR) 150 MG 12 hr tablet Take 150 mg by mouth 2 (two) times daily. 12/16/23   [provider]  Calcium-Magnesium -Vitamin D (CITRACAL CALCIUM+D PO) Take 1 tablet by mouth daily.    [provider]  cephALEXin  (KEFLEX ) 500 MG capsule Take 1 capsule (500 mg total) by mouth every 12 (twelve) hours. 03/02/24   Maczis, Michael M, PA-C  docusate sodium  (COLACE) 100 MG capsule Take 1 capsule (100 mg total) by mouth 2 (two) times daily as needed for mild constipation. 03/02/24   Maczis, Michael M, PA-C  famotidine  (PEPCID ) 40 MG tablet Take 40 mg by mouth at bedtime.    [provider]  famotidine  (PEPCID ) 40 MG tablet Take 40 mg by mouth at bedtime. 12/16/23   [provider]  feeding supplement (ENSURE ENLIVE / ENSURE PLUS) LIQD Take 237 mLs by mouth 2 (two) times daily between meals. Patient taking differently: Take 237 mLs by mouth as needed (Meal replacement). 02/14/23   Rosario Leatrice FERNS, MD  ferrous sulfate  325 (65 FE) MG tablet Take 1 tablet (325 mg total) by mouth 3 (three) times daily with meals. 03/02/24   Maczis, Michael M, PA-C  folic acid  (FOLVITE ) 1 MG tablet Take 1 tablet (1 mg total) by mouth daily. 01/25/24   Sherrill Cable Latif, DO  folic acid  (FOLVITE ) 1 MG tablet Take 1 mg by mouth daily. 01/24/24   [provider]  ipratropium-albuterol  (DUONEB) 0.5-2.5 (3) MG/3ML SOLN Take 3 mLs by nebulization every 4 (four) hours as needed. 03/02/24   Maczis, Michael M, PA-C  levothyroxine  (SYNTHROID ) 25 MCG tablet Take 1 tablet (25 mcg total) by mouth daily at 6 (six) AM. 03/03/24   Maczis, Ozell HERO, PA-C  liver oil-zinc  oxide (DESITIN) 40 % ointment Apply topically 2 (two) times daily. 03/02/24   Maczis, Michael M, PA-C  magnesium  oxide (MAG-OX) 400 (240 Mg) MG tablet Take 1 tablet by mouth 2 (two) times daily. 12/15/23   [provider]  Magnesium  Oxide 400 MG CAPS Take 400 mg by mouth daily as needed (cramps).    [provider]  methocarbamol  (ROBAXIN ) 500 MG tablet Take 1 tablet (500 mg total) by mouth every 8 (eight) hours as  needed for muscle spasms. 01/24/24   Sherrill Cable Latif, DO  methocarbamol  (ROBAXIN ) 500 MG tablet Take 1 tablet (500 mg total) by mouth every 6 (six) hours as needed for muscle spasms. 03/02/24   Maczis, Michael M, PA-C  metoprolol  (LOPRESSOR ) 50 MG tablet Take 1 tablet (50 mg total) by mouth 2 (two) times daily. 05/04/11   Inocencio Berwyn LABOR, MD  metoprolol  tartrate (LOPRESSOR ) 25 MG tablet Take 0.5 tablets (12.5 mg total) by mouth 2 (two) times daily. 03/02/24   Maczis, Michael M, PA-C  mirtazapine  (REMERON  SOL-TAB) 15 MG disintegrating tablet Take 0.5 tablets (7.5 mg total) by mouth at bedtime. 03/02/24   Maczis, Michael M, PA-C  Multiple Vitamin (MULTIVITAMIN WITH MINERALS) TABS tablet Take 1 tablet by mouth daily. 03/02/24   Maczis, Michael M, PA-C  Multiple Vitamin (MULTIVITAMIN) tablet Take 1 tablet by mouth daily with breakfast.    [provider]  ondansetron  (ZOFRAN ) 4 MG tablet Take 1 tablet (4 mg total) by mouth every 6 (six) hours as needed for nausea. 01/24/24   Sherrill Cable Latif, DO  ondansetron  (ZOFRAN ) 4 MG tablet Take 4 mg by mouth every 6 (six) hours as needed. 01/24/24   [provider]  pantoprazole  (PROTONIX ) 40 MG tablet Take 1 tablet (40 mg total) by mouth daily. 01/28/23 01/21/24  Dahal, Binaya, MD  polyethylene glycol (MIRALAX  / GLYCOLAX ) 17 g packet Take 17 g by mouth daily. 01/24/24   Sheikh, Omair Latif, DO  polyethylene glycol (MIRALAX  / GLYCOLAX ) 17 g packet Take 17 g by mouth daily as needed. 03/02/24   Maczis, Michael M, PA-C  senna-docusate (SENOKOT-S) 8.6-50 MG tablet Take 1 tablet by mouth at bedtime. 01/24/24   Sheikh, Omair Latif, DO  sodium bicarbonate  650 MG tablet Take 2 tablets (1,300 mg total) by mouth 2 (two) times daily. Patient taking differently: Take 650 mg by mouth 2 (two) times daily. 02/14/23 01/21/24  Rosario Leatrice FERNS, MD  sodium bicarbonate  650 MG tablet Take 1 tablet (650 mg total) by mouth 2 (two) times daily. 03/02/24   Maczis, Michael  M, PA-C  tacrolimus  (PROGRAF ) 1 MG capsule Take 1 mg by mouth in the morning and at bedtime.    [provider]  tacrolimus  (PROGRAF ) 1 MG capsule Take 1 mg by mouth every 12 (twelve) hours. 12/12/23   [provider]  tamsulosin  (FLOMAX ) 0.4 MG CAPS capsule Take 0.4 mg by mouth daily.    [provider]  tamsulosin  (FLOMAX ) 0.4 MG CAPS capsule Take 0.4 mg by mouth daily after supper. 12/16/23   [provider]  thiamine  (VITAMIN B-1) 100 MG tablet Take 1 tablet (100 mg total) by mouth daily. 01/25/24   Sheikh, Omair  Latif, DO  thiamine  (VITAMIN B-1) 100 MG tablet Take 1 tablet (100 mg total) by mouth daily. 03/03/24   Maczis, Michael M, PA-C  traZODone  (DESYREL ) 100 MG tablet Take 100 mg by mouth at bedtime as needed for sleep.    [provider]  traZODone  (DESYREL ) 100 MG tablet Take 100 mg by mouth at bedtime. 08/09/23   [provider]    Allergies: Tape, Zolpidem, Moxifloxacin, Tape, and Zolpidem    Review of Systems  Gastrointestinal:  Negative for abdominal pain.       PEG tube leakage  All other systems reviewed and are negative.   Updated Vital Signs BP 129/73   Pulse 63   Resp 16   Ht 1.778 m (5' 10)   Wt 63.5 kg   SpO2 100%   BMI 20.09 kg/m   Physical Exam Vitals and nursing note reviewed.  Constitutional:      Appearance: He is well-developed.     Comments: Thin, frail  HENT:     Head: Normocephalic and atraumatic.  Eyes:     Pupils: Pupils are equal, round, and reactive to light.  Cardiovascular:     Rate and Rhythm: Normal rate and regular rhythm.     Heart sounds: Normal heart sounds. No murmur heard. Pulmonary:     Effort: Pulmonary effort is normal. No respiratory distress.     Breath sounds: Normal breath sounds. No wheezing.  Abdominal:     General: Bowel sounds are normal.     Palpations: Abdomen is soft.     Tenderness: There is no abdominal tenderness.     Comments: PEG tube upper abdomen,  granuloma noted, adjacent skin irritated with some breakdown, no significant erythema or drainage  Musculoskeletal:     Cervical back: Neck supple.  Lymphadenopathy:     Cervical: No cervical adenopathy.  Skin:    General: Skin is warm and dry.  Neurological:     Mental Status: He is alert and oriented to person, place, and time.  Psychiatric:        Mood and Affect: Mood normal.     (all labs ordered are listed, but only abnormal results are displayed) Labs Reviewed - No data to display  EKG: None  Radiology: No results found.   Procedures   Medications Ordered in the ED - No data to display  Clinical Course as of 04/25/24 0555  Wed Apr 25, 2024  0552 Spoke to Dr. Paola, general surgery.  Recommends outpatient nutrition and dietary evaluation and outpatient general surgery follow-up if appropriate for removal. [CH]    Clinical Course User Index [CH] Necole Minassian, Charmaine FALCON, MD                                 Medical Decision Making  This patient presents to the ED for concern of requesting PEG tube removal, this involves an extensive number of treatment options, and is a complaint that carries with it a high risk of complications and morbidity.  I considered the following differential and admission for this acute, potentially life threatening condition.  The differential diagnosis includes PEG malfunction, PEG infection or irritation  MDM:    This is a 73 year old male who presents with request for PEG tube removal.  No indication of acute infection.  He does have some skin breakdown and a granuloma at the site.  He has not used it in over 2 weeks.  See discussion with general surgery.  He [place]  Recommend that he arrange for dietary and nutrition evaluation and general surgery follow-up.  No acute emergent process.  Abdomen is otherwise benign.  (Labs, imaging, consults)  Labs: I Ordered, and personally interpreted labs.   The pertinent results include: N/A  Imaging Studies ordered: I ordered imaging studies including N/A I independently visualized and interpreted imaging. I agree with the radiologist interpretation  Additional history obtained from chart review.  External records from outside source obtained and reviewed including admission  Cardiac Monitoring: The patient was not maintained on a cardiac monitor.  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 lynwood dewar   Hypertension    THROMBOCYTOPENIA      Medicines No orders of the defined types were placed in this encounter.   I have reviewed the patients home medicines and have made adjustments as needed  Problem List / ED Course: Problem List Items Addressed This Visit   None Visit Diagnoses       Irritation around percutaneous endoscopic gastrostomy (PEG) tube site [facility])    -  Primary                Final diagnoses:  Irritation around percutaneous endoscopic gastrostomy (PEG) tube site [facility])    ED Discharge Orders     None          Bari Charmaine FALCON, MD 04/25/24 [phone]

## 2024-04-25 NOTE — ED Triage Notes (Signed)
 Pot bibgcems from home c/o feeding tube that was supposed to be removed two weeks ago is  excoriated and inflamed. Pt c/o 5/10 pain with movement. Site has purulent drainage and redness.  Bp 124/66 Hr 60 96% 2L

## 2024-04-25 NOTE — Discharge Instructions (Signed)
 You were seen today requesting your PEG tube to be removed.  The surgeon on-call recommends that you see a dietitian/nutrition as an outpatient.  This can be arranged through primary care physician.  If deemed that you are taking enough calories, you can follow-up in the general surgery office.  Follow-up information provided.  Make sure to keep area around the PEG tube clean and dry.

## 2024-04-26 ENCOUNTER — Ambulatory Visit: Payer: Medicare (Managed Care) | Admitting: Student

## 2024-04-26 DIAGNOSIS — Z944 Liver transplant status: Secondary | ICD-10-CM

## 2024-04-26 DIAGNOSIS — D696 Thrombocytopenia, unspecified: Secondary | ICD-10-CM

## 2024-04-26 DIAGNOSIS — M810 Age-related osteoporosis without current pathological fracture: Secondary | ICD-10-CM

## 2024-04-26 DIAGNOSIS — I10 Essential (primary) hypertension: Secondary | ICD-10-CM

## 2024-04-26 DIAGNOSIS — Z7689 Persons encountering health services in other specified circumstances: Secondary | ICD-10-CM
